# Patient Record
Sex: Male | Born: 1935
Health system: Southern US, Community
[De-identification: ages and names within clinical notes are randomized; demographics above are authoritative.]

## PROBLEM LIST (undated history)

## (undated) DIAGNOSIS — F419 Anxiety disorder, unspecified: Secondary | ICD-10-CM

## (undated) DIAGNOSIS — F0280 Dementia in other diseases classified elsewhere without behavioral disturbance: Secondary | ICD-10-CM

## (undated) DIAGNOSIS — K5900 Constipation, unspecified: Secondary | ICD-10-CM

## (undated) DIAGNOSIS — I10 Essential (primary) hypertension: Secondary | ICD-10-CM

## (undated) DIAGNOSIS — I517 Cardiomegaly: Secondary | ICD-10-CM

## (undated) DIAGNOSIS — F0390 Unspecified dementia without behavioral disturbance: Secondary | ICD-10-CM

## (undated) DIAGNOSIS — F32A Depression, unspecified: Secondary | ICD-10-CM

## (undated) DIAGNOSIS — I2510 Atherosclerotic heart disease of native coronary artery without angina pectoris: Secondary | ICD-10-CM

## (undated) DIAGNOSIS — K219 Gastro-esophageal reflux disease without esophagitis: Secondary | ICD-10-CM

## (undated) DIAGNOSIS — H9190 Unspecified hearing loss, unspecified ear: Secondary | ICD-10-CM

## (undated) DIAGNOSIS — I119 Hypertensive heart disease without heart failure: Secondary | ICD-10-CM

## (undated) DIAGNOSIS — J449 Chronic obstructive pulmonary disease, unspecified: Secondary | ICD-10-CM

## (undated) DIAGNOSIS — K649 Unspecified hemorrhoids: Secondary | ICD-10-CM

## (undated) DIAGNOSIS — F329 Major depressive disorder, single episode, unspecified: Secondary | ICD-10-CM

## (undated) DIAGNOSIS — E785 Hyperlipidemia, unspecified: Secondary | ICD-10-CM

## (undated) DIAGNOSIS — J45909 Unspecified asthma, uncomplicated: Secondary | ICD-10-CM

## (undated) DIAGNOSIS — N400 Enlarged prostate without lower urinary tract symptoms: Secondary | ICD-10-CM

## (undated) HISTORY — DX: Gastro-esophageal reflux disease without esophagitis: K21.9

## (undated) HISTORY — DX: Atherosclerotic heart disease of native coronary artery without angina pectoris: I25.10

## (undated) HISTORY — PX: OTHER SURGICAL HISTORY: SHX169

## (undated) HISTORY — DX: Constipation, unspecified: K59.00

## (undated) HISTORY — DX: Hypertensive heart disease without heart failure: I11.9

## (undated) HISTORY — PX: CHOLECYSTECTOMY: SHX55

## (undated) HISTORY — DX: Anxiety disorder, unspecified: F41.9

## (undated) HISTORY — DX: Chronic obstructive pulmonary disease, unspecified: J44.9

## (undated) HISTORY — DX: Hyperlipidemia, unspecified: E78.5

## (undated) HISTORY — DX: Cardiomegaly: I51.7

## (undated) HISTORY — DX: Dementia in other diseases classified elsewhere, unspecified severity, without behavioral disturbance, psychotic disturbance, mood disturbance, and anxiety: F02.80

## (undated) HISTORY — PX: HEMORRHOID SURGERY: SHX153

## (undated) HISTORY — DX: Unspecified hemorrhoids: K64.9

## (undated) HISTORY — DX: Unspecified hearing loss, unspecified ear: H91.90

## (undated) HISTORY — DX: Benign prostatic hyperplasia without lower urinary tract symptoms: N40.0

## (undated) HISTORY — DX: Depression, unspecified: F32.A

---

## 1998-05-03 ENCOUNTER — Encounter: Payer: Self-pay | Admitting: Family Medicine

## 1998-05-03 ENCOUNTER — Ambulatory Visit (HOSPITAL_COMMUNITY): Admission: RE | Admit: 1998-05-03 | Discharge: 1998-05-03 | Payer: Self-pay | Admitting: Family Medicine

## 1998-05-04 ENCOUNTER — Encounter: Admission: RE | Admit: 1998-05-04 | Discharge: 1998-05-04 | Payer: Self-pay | Admitting: *Deleted

## 1998-05-12 ENCOUNTER — Encounter: Payer: Self-pay | Admitting: Family Medicine

## 1998-05-12 ENCOUNTER — Ambulatory Visit (HOSPITAL_COMMUNITY): Admission: RE | Admit: 1998-05-12 | Discharge: 1998-05-12 | Payer: Self-pay | Admitting: Family Medicine

## 1999-04-23 ENCOUNTER — Encounter: Payer: Self-pay | Admitting: Family Medicine

## 1999-04-23 ENCOUNTER — Ambulatory Visit (HOSPITAL_COMMUNITY): Admission: RE | Admit: 1999-04-23 | Discharge: 1999-04-23 | Payer: Self-pay | Admitting: Family Medicine

## 1999-07-14 ENCOUNTER — Encounter: Payer: Self-pay | Admitting: General Surgery

## 1999-07-18 ENCOUNTER — Ambulatory Visit (HOSPITAL_COMMUNITY): Admission: RE | Admit: 1999-07-18 | Discharge: 1999-07-19 | Payer: Self-pay | Admitting: General Surgery

## 1999-07-18 ENCOUNTER — Encounter (INDEPENDENT_AMBULATORY_CARE_PROVIDER_SITE_OTHER): Payer: Self-pay | Admitting: *Deleted

## 2000-12-03 ENCOUNTER — Ambulatory Visit (HOSPITAL_COMMUNITY): Admission: RE | Admit: 2000-12-03 | Discharge: 2000-12-03 | Payer: Self-pay | Admitting: Gastroenterology

## 2000-12-03 ENCOUNTER — Encounter (INDEPENDENT_AMBULATORY_CARE_PROVIDER_SITE_OTHER): Payer: Self-pay | Admitting: *Deleted

## 2002-12-17 ENCOUNTER — Ambulatory Visit (HOSPITAL_COMMUNITY): Admission: RE | Admit: 2002-12-17 | Discharge: 2002-12-17 | Payer: Self-pay | Admitting: Family Medicine

## 2002-12-17 ENCOUNTER — Encounter: Payer: Self-pay | Admitting: Family Medicine

## 2003-04-06 ENCOUNTER — Emergency Department (HOSPITAL_COMMUNITY): Admission: AD | Admit: 2003-04-06 | Discharge: 2003-04-06 | Payer: Self-pay | Admitting: Family Medicine

## 2003-05-05 ENCOUNTER — Emergency Department (HOSPITAL_COMMUNITY): Admission: EM | Admit: 2003-05-05 | Discharge: 2003-05-06 | Payer: Self-pay | Admitting: Emergency Medicine

## 2003-08-05 ENCOUNTER — Ambulatory Visit (HOSPITAL_COMMUNITY): Admission: RE | Admit: 2003-08-05 | Discharge: 2003-08-05 | Payer: Self-pay | Admitting: Gastroenterology

## 2003-11-20 ENCOUNTER — Observation Stay (HOSPITAL_COMMUNITY): Admission: EM | Admit: 2003-11-20 | Discharge: 2003-11-21 | Payer: Self-pay | Admitting: Emergency Medicine

## 2003-11-22 ENCOUNTER — Emergency Department (HOSPITAL_COMMUNITY): Admission: EM | Admit: 2003-11-22 | Discharge: 2003-11-22 | Payer: Self-pay

## 2005-09-23 ENCOUNTER — Emergency Department (HOSPITAL_COMMUNITY): Admission: EM | Admit: 2005-09-23 | Discharge: 2005-09-23 | Payer: Self-pay | Admitting: Emergency Medicine

## 2006-06-03 ENCOUNTER — Emergency Department (HOSPITAL_COMMUNITY): Admission: EM | Admit: 2006-06-03 | Discharge: 2006-06-04 | Payer: Self-pay | Admitting: Emergency Medicine

## 2008-09-22 LAB — HM COLONOSCOPY

## 2009-02-22 HISTORY — PX: CARDIAC CATHETERIZATION: SHX172

## 2009-03-11 ENCOUNTER — Encounter: Admission: RE | Admit: 2009-03-11 | Discharge: 2009-03-11 | Payer: Self-pay | Admitting: Cardiovascular Disease

## 2009-03-15 ENCOUNTER — Ambulatory Visit (HOSPITAL_COMMUNITY): Admission: RE | Admit: 2009-03-15 | Discharge: 2009-03-15 | Payer: Self-pay | Admitting: Cardiovascular Disease

## 2009-10-11 ENCOUNTER — Ambulatory Visit: Payer: Self-pay | Admitting: Pulmonary Disease

## 2009-10-11 DIAGNOSIS — E785 Hyperlipidemia, unspecified: Secondary | ICD-10-CM | POA: Insufficient documentation

## 2009-10-11 DIAGNOSIS — R05 Cough: Secondary | ICD-10-CM

## 2009-10-11 DIAGNOSIS — I10 Essential (primary) hypertension: Secondary | ICD-10-CM

## 2009-12-20 ENCOUNTER — Telehealth: Payer: Self-pay | Admitting: Pulmonary Disease

## 2009-12-24 ENCOUNTER — Emergency Department (HOSPITAL_COMMUNITY): Admission: EM | Admit: 2009-12-24 | Discharge: 2009-12-24 | Payer: Self-pay | Admitting: Family Medicine

## 2010-01-31 ENCOUNTER — Inpatient Hospital Stay (HOSPITAL_COMMUNITY): Admission: EM | Admit: 2010-01-31 | Discharge: 2010-02-03 | Payer: Self-pay | Admitting: Emergency Medicine

## 2010-03-23 ENCOUNTER — Ambulatory Visit: Payer: Self-pay | Admitting: Pulmonary Disease

## 2010-05-23 ENCOUNTER — Ambulatory Visit
Admission: RE | Admit: 2010-05-23 | Discharge: 2010-05-23 | Payer: Self-pay | Source: Home / Self Care | Attending: Pulmonary Disease | Admitting: Pulmonary Disease

## 2010-05-24 NOTE — Progress Notes (Signed)
Summary: nos appt  Phone Note Call from Patient   Caller: [email or web address]  Call For: Yvonnie Schinke Summary of Call: LMTCB x2 to rsc nos from 8/26. Initial call taken by: Darletta Moll,  December 20, 2009 2:49 PM

## 2010-05-24 NOTE — Assessment & Plan Note (Signed)
Summary: PER WIFE'S CALL/PT DUE/[facility]   Visit Type:  Follow-up Copy to:  self  Primary Provider/Referring Provider:  n/a ([facility])  CC:  Pt here for follow up. C/o productive cough with thick clear mucus, wheezing, SOB with exertion, and PND. Pt states use to sleep on air mattress and wants to discuss if this has anything to do with his problems.  History of Present Illness: 77//M, ex smoker for evaluation of chronic 'congestion' taking mucinex x few years for constant clearing of his throat, worse in pollen season, clear phlegm, breathing OK. CXR 03/11/09 appears nml. c. cath [name]) -11/10 - nml LV fn, nml coronaries Spirometry showed poor effort, unable to interpret. he denies post nasal drip, childhood h/o asthma or obvious heartburn.  March 23, 2010 3:31 PM  Adm to [facility] for partial SBO in oct '11, told that he had asthma, CXR -streaky  atx lt base c/o cough with clear phlegm, occ wheezing c/o heartburn - takes protonix as needed   Preventive Screening-Counseling & Management  Alcohol-Tobacco     Alcohol drinks/day: occ     Smoking Status: quit     Packs/Day: 0.5     Year Started: 1955     Year Quit: 2004  Current Medications (verified): 1)  Simvastatin 40 Mg Tabs (Simvastatin) .... Take 1 Tab By Mouth At Bedtime 2)  Metoprolol Succinate 25 Mg Xr24h-Tab (Metoprolol Succinate) .... Take 1 Tablet By Mouth Once A Day 3)  Mucinex 600 Mg Xr12h-Tab (Guaifenesin) .... Take 1 Tablet By Mouth Once A Day 4)  Omega-3 Fish Oil 1000 Mg Caps (Omega-3 Fatty Acids) .... Take 2 Tablet By Mouth Once A Day 5)  Aspirin 81 Mg  Tabs (Aspirin) .... Take 1 Tablet By Mouth Once A Day 6)  Cozaar 100 Mg Tabs (Losartan Potassium) .... Take 1 Tablet By Mouth Once A Day 7)  Hydrochlorothiazide 12.5 Mg Caps (Hydrochlorothiazide) .... Take 1 Tablet By Mouth Once A Day 8)  Miralax  Powd (Polyethylene Glycol 3350) .... Daily 9)  Pantoprazole Sodium 40 Mg Tbec (Pantoprazole Sodium) .... Take 1 Tablet  By Mouth Once A Day 10)  Zyrtec Hives Relief 10 Mg Tabs (Cetirizine Hcl) .... 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    Social History: Alcohol drinks/day:  occ  Review of Systems       The patient complains of prolonged cough.  The patient denies anorexia, fever, weight loss, weight gain, vision loss, decreased hearing, hoarseness, chest pain, syncope, dyspnea on exertion, peripheral edema, headaches, hemoptysis, abdominal pain, melena, hematochezia, severe indigestion/heartburn, hematuria, muscle weakness, suspicious skin lesions, transient blindness, difficulty walking, depression, unusual weight change, abnormal bleeding, enlarged lymph nodes, and angioedema.    Vital Signs:  Patient profile:   77 year old male Height:      67 inches Weight:      195.4 pounds BMI:     30.71 O2 Sat:      97 % on Room air Temp:     97.0 degrees F oral Pulse rate:   90 / minute BP sitting:   114 / 60  (left arm) Cuff size:   large  Vitals Entered By: Zackery Barefoot CMA (March 23, 2010 3:20 PM)  O2 Flow:  Room air CC: Pt here for follow up. C/o productive cough with thick clear mucus, wheezing, SOB with exertion, PND. Pt states use to sleep on air mattress and wants to discuss if this has anything to do with his problems Comments Medications reviewed with patient Verified contact number and pharmacy with patient Zackery Barefoot CMA  March 23, 2010 3:20 PM    Physical Exam  Additional Exam:  Gen. Pleasant, well-nourished, in no distress, normal affect ENT - no lesions, no post nasal drip Neck: No JVD, no thyromegaly, no carotid bruits Lungs: no use of accessory  muscles, no dullness to percussion, clear without rales or rhonchi  Cardiovascular: Rhythm regular, heart sounds  normal, no murmurs or gallops, no peripheral edema Musculoskeletal: No deformities, no cyanosis or clubbing      Impression & Recommendations:  Problem # 1:  COUGH (ICD-786.2)  will treat as COPD & GERD trial of symbicort for cough & dyspnea  Take protonix daily  Orders: Est. Patient Level III (40981)  Medications Added to Medication List This Visit: 1)  Miralax Powd (Polyethylene glycol 3350) .... Daily 2)  Pantoprazole Sodium 40 Mg Tbec (Pantoprazole sodium) .... Take 1 tablet by mouth once a day 3)  Zyrtec Hives Relief 10 Mg Tabs (Cetirizine hcl) .... Take 1 tablet by mouth once a day as needed  Patient Instructions: 1)  Copy sent to: 2)  Please schedule a follow-up appointment in 2 months. 3)  Take heartburn pill everyday 4)  Trial of symbicort 80/4.5 1 puff two times a day - call me if this works    Immunization History:  Influenza Immunization History:    Influenza:  historical (12/27/2009)

## 2010-05-24 NOTE — Assessment & Plan Note (Signed)
Summary: cough/apc   Visit Type:  Initial Consult Copy to:  self  Primary Provider/Referring Provider:  n/a ([facility])  CC:  Pt here for initial consult. Pt here for SOB with exertion, chest congestion, and productive cough with thick clear mucus.  History of Present Illness: 77/M, ex smoker for evaluation of chronic 'congestion' taking mucinex x few years for constant clearing of his throat, worse in pollen season, clear phlegm, breathing OK. CXR 03/11/09 appears nml. c. cath [name]) -11/10 - nml LV fn, nml coronaries Spirometry showed poor effort, unable to interpret. he denies post nasal drip, childhood h/o asthma or obvious heartburn.  Preventive Screening-Counseling & Management  Alcohol-Tobacco     Smoking Status: quit     Packs/Day: 0.5     Year Started: 1953     Year Quit: 2004  Current Medications (verified): 1)  Stool Softener 100 Mg Caps (Docusate Sodium) .... Take 1 Tablet By Mouth Once A Day 2)  Simvastatin 40 Mg Tabs (Simvastatin) .... Take 1 Tab By Mouth At Bedtime 3)  Metoprolol Succinate 25 Mg Xr24h-Tab (Metoprolol Succinate) .... Take 1 Tablet By Mouth Once A Day 4)  Mucinex 600 Mg Xr12h-Tab (Guaifenesin) .... Take 1 Tablet By Mouth Once A Day 5)  Omega-3 Fish Oil 1000 Mg Caps (Omega-3 Fatty Acids) .... Take 2 Tablet By Mouth Once A Day 6)  Aspirin 81 Mg  Tabs (Aspirin) .... Take 1 Tablet By Mouth Once A Day 7)  Cozaar 100 Mg Tabs (Losartan Potassium) .... Take 1 Tablet By Mouth Once A Day 8)  Hydrochlorothiazide 12.5 Mg Caps (Hydrochlorothiazide) .... Take 1 Tablet By Mouth Once A Day 9)  Stool Softener 100 Mg Caps (Docusate Sodium) .... 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   Smoking Status:  quit Packs/Day:  0.5  Review of Systems       The patient complains of shortness of breath with activity, shortness of breath at rest, productive cough, acid heartburn, and headaches.  The patient denies non-productive cough, coughing up blood, chest pain, irregular heartbeats, indigestion, loss of appetite, weight change, abdominal pain, difficulty swallowing, sore throat, tooth/dental problems, nasal congestion/difficulty breathing through nose, sneezing, itching, ear ache, anxiety, depression, hand/feet swelling, joint stiffness or pain, rash, change in color of mucus, and fever.    Vital Signs:  Patient profile:   77 year old male Height:      67 inches Weight:      193 pounds BMI:     30.34 BSA:     1.99 O2 Sat:      95 % on Room air Temp:     97.5 degrees F oral Pulse rate:   92 / minute BP sitting:   124 / 78  (left arm) Cuff size:   regular  Vitals Entered By: Zackery Barefoot CMA (October 11, 2009 3:20 PM)  O2 Flow:  Room air CC: Pt here for initial consult. Pt here for SOB with exertion, chest congestion, productive cough with thick clear mucus Comments Medications reviewed with patient Verified contact number and pharmacy with patient Zackery Barefoot CMA  October 11, 2009 3:20 PM  Physical Exam  Additional Exam:  Gen. Pleasant, well-nourished, in no distress, normal affect ENT - no lesions, no post nasal drip Neck: No JVD, no thyromegaly, no carotid bruits Lungs: no use of accessory muscles, no dullness to percussion, clear without rales or rhonchi  Cardiovascular: Rhythm regular, heart sounds  normal, no murmurs or gallops, no peripheral edema Abdomen: soft and non-tender, no hepatosplenomegaly, BS  normal. Musculoskeletal: No deformities, no cyanosis or clubbing Neuro:  alert, non focal     Pulmonary Function Test Date: 10/11/2009 4:11 PM Gender: Male  Pre-Spirometry FVC    Value: 0.39 L/min   % Pred: 11.90 % FEV1    Value: 0.38 L     Pred: 2.45 L     % Pred: 15.40 % FEV1/FVC  Value: 95.61 %     % Pred: 126.30 %  Impression & Recommendations:  Problem # 1:  COUGH (ICD-786.2)  Unclear cause, nml CXR in this ex smoker Unable to estimate lung fucntion accurately, appears sedentary. Will treat for upper airway cough with nasal steroid & oral anti-histaminic. If no relief, consider empiric GERD therapy.  Orders: Prescription Created Electronically [phone]) Consultation Level III [phone]) Spirometry w/Graph (94010)  Medications Added to Medication List This Visit: 1)  Stool Softener 100 Mg Caps (Docusate sodium) .... Take 1 tablet by mouth once a day 2)  Simvastatin 40 Mg Tabs (Simvastatin) .... Take 1 tab by mouth at bedtime 3)  Metoprolol Succinate 25 Mg Xr24h-tab (Metoprolol succinate) .... Take 1 tablet by mouth once a day 4)  Mucinex 600 Mg Xr12h-tab (Guaifenesin) .... Take 1 tablet by mouth once a day 5)  Omega-3 Fish Oil 1000 Mg Caps (Omega-3 fatty acids) .... Take 2 tablet by mouth once a day 6)  Aspirin 81 Mg Tabs (Aspirin) .... Take 1 tablet by mouth once a day 7)  Cozaar 100 Mg Tabs (Losartan potassium) .... Take 1 tablet by mouth once a day 8)  Hydrochlorothiazide 12.5 Mg Caps (Hydrochlorothiazide) .... Take 1 tablet by mouth once a day 9)  Stool Softener 100 Mg Caps (Docusate sodium) .... Take 2 tablet by mouth once a day 10)  Zyrtec Hives Relief 10 Mg Tabs (Cetirizine hcl) .... Once daily  Other Orders: Internal Medicine Referral (Internal)  Patient Instructions: 1)  trial of nasal inhaler (sample) at bedtime  - Omnaris  2)  ZYRTEC allergy pills once daily x 1 month 3)  Call if cough is still present in 1 month Prescriptions: ZYRTEC HIVES RELIEF 10 MG TABS  (CETIRIZINE HCL) once daily  #30 x 1   Entered and Authorized by:   Comer Locket Vassie Loll MD   Signed by:   Comer Locket Vassie Loll MD on 10/12/2009   Method used:   Electronically to        [facility]  [address] [id number] (retail)       [address]       [place], [place]  [address]       Ph: [phone] or [phone]       Fax: [phone]   RxID:   [id number]    CardioPerfect Spirometry  ID: [id number] Patient: Alexander [name], Alexander [name] DOB: [birth year] Age: 77 Years Old Sex: Male Race: Black Physician: Vassie Loll Height: 67 Weight: 193 PPD: 0.5 Status: Confirmed Recorded: 10/11/2009 4:11 PM  Parameter  Measured Predicted %Predicted FVC     0.39        3.31        11.90 FEV1  0.38        2.45        15.40 FEV1%   95.61        75.71        126.30 PEF    0.80        7.10        11.30   Interpretation: Poor effort Very severe restriction

## 2010-06-01 NOTE — Assessment & Plan Note (Signed)
Summary: rov 2 months w/ tp per ra ///kp   Visit Type:  NP follow up Copy to:  self  Primary Provider/Referring Provider:  n/a ([facility])  CC:  Pt c/o productive cough with clear to green mucus. Pt states Symbicort made breathing worse and has d/c same. Pt c/o wheezing and and breathing the same.  History of Present Illness: 77//M, ex smoker for evaluation of chronic 'congestion' taking mucinex x few years for constant clearing of his throat, worse in pollen season, clear phlegm, breathing OK. CXR 03/11/09 appears nml. c. cath [name]) -11/10 - nml LV fn, nml coronaries Spirometry showed poor effort, unable to interpret. he denies post nasal drip, childhood h/o asthma or obvious heartburn.  March 23, 2010 3:31 PM  Adm to [facility] for partial SBO in oct '11, told that he had asthma, CXR -streaky  atx lt base c/o cough with clear phlegm, occ wheezing c/o heartburn - takes protonix as needed   May 23, 2010 --Presents for a follow up. PT says his breathing and cough are no better. Complains of nasal drainage, cough -w/ clear mucus.Has some yellow /green in am but clears as day goes on.  Was given SYmbicort last ov w/ no benefit, felt worse. Previous spirometry unable to read.  Says he feels tired alot , wears out easly, has cough and tickle in throat alot. . Denies chest pain,  orthopnea, hemoptysis, fever, n/v/d, edema, headache,.  Preventive Screening-Counseling & Management  Alcohol-Tobacco     Alcohol drinks/day: occ     Smoking Status: quit     Packs/Day: 0.5     Year Started: 1953     Year Quit: 2004  Medications Prior to Update: 1)  Pantoprazole Sodium 40 Mg Tbec (Pantoprazole Sodium) .... Take 1 Tablet By Mouth Once A Day 2)  Simvastatin 40 Mg Tabs (Simvastatin) .... Take 1 Tab By Mouth At Bedtime 3)  Mucinex 600 Mg Xr12h-Tab (Guaifenesin) .... Take 1 Tablet By Mouth Once A Day 4)  Metoprolol Succinate 25 Mg Xr24h-Tab (Metoprolol Succinate) .... Take 1 Tablet By Mouth  Once A Day 5)  Omega-3 Fish Oil 1000 Mg Caps (Omega-3 Fatty Acids) .... Take 2 Tablet By Mouth Once A Day 6)  Aspirin 81 Mg  Tabs (Aspirin) .... Take 1 Tablet By Mouth Once A Day 7)  Cozaar 100 Mg Tabs (Losartan Potassium) .... Take 1 Tablet By Mouth Once A Day 8)  Hydrochlorothiazide 12.5 Mg Caps (Hydrochlorothiazide) .... Take 1 Tablet By Mouth Once A Day 9)  Miralax  Powd (Polyethylene Glycol 3350) .... Daily 10)  Zyrtec Hives Relief 10 Mg Tabs (Cetirizine Hcl) .... Take 1 Tablet By Mouth Once A Day As Needed  Current Medications (verified): 1)  Simvastatin 40 Mg Tabs (Simvastatin) .... Take 1 Tab By Mouth At Bedtime 2)  Metoprolol Succinate 25 Mg Xr24h-Tab (Metoprolol Succinate) .... Take 1 Tablet By Mouth Once A Day 3)  Mucinex 600 Mg Xr12h-Tab (Guaifenesin) .... Take 1 Tablet By Mouth Once A Day As Needed 4)  Omega-3 Fish Oil 1000 Mg Caps (Omega-3 Fatty Acids) .... Take 2 Tablet By Mouth Once A Day 5)  Aspirin 81 Mg  Tabs (Aspirin) .... Take 1 Tablet By Mouth Once A Day 6)  Cozaar 100 Mg Tabs (Losartan Potassium) .... Take 1 Tablet By Mouth Once A Day 7)  Hydrochlorothiazide 12.5 Mg Caps (Hydrochlorothiazide) .... Take One Tablet By Mouth Every Other Day 8)  Miralax  Powd (Polyethylene Glycol 3350) .... Daily 9)  Pantoprazole Sodium 40 Mg Tbec (Pantoprazole Sodium) .... Take 1 Tablet By Mouth Once A Day 10)  Zyrtec Hives Relief 10 Mg Tabs (Cetirizine Hcl) .... 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    Risk Factors: Smoking Status: quit (05/23/2010) Packs/Day: 0.5 (05/23/2010)  Review of Systems      See HPI  Vital Signs:  Patient profile:   77 year old male Height:      67 inches Weight:      198 pounds BMI:     31.12 O2 Sat:      91 % on Room air Temp:     97.5 degrees F oral Pulse rate:   64 / minute BP sitting:   104 / 64  (left arm) Cuff size:   large  Vitals Entered By: Zackery Barefoot CMA (May 23, 2010 2:22 PM)  O2 Flow:  Room air CC: Pt c/o productive cough with clear to green mucus. Pt states Symbicort made breathing worse and has d/c same. Pt c/o wheezing, and breathing the same Is Patient Diabetic? No Comments Medications reviewed with patient Verified contact number and pharmacy with patient Zackery Barefoot CMA  May 23, 2010 2:22 PM    Physical Exam  Additional Exam:  Gen. Pleasant, well-nourished, in no distress, normal affect ENT - no lesions,  post pharynx w/  clear drainge  Neck: No JVD, no thyromegaly, no carotid bruits Lungs: no use of accessory muscles, no dullness to percussion, clear without rales or rhonchi  Cardiovascular: Rhythm regular, heart sounds  normal, no murmurs or gallops, no peripheral edema Musculoskeletal: No deformities, no cyanosis or clubbing      Impression & Recommendations:  Problem # 1:  COUGH (ICD-786.2) XR today w/ no acute changes.  suspect this is multifactoral w/ rhinitis , gerd and ? degree of COPD  Plan : Take Zyrtec 10mg  at bedtime  Saline nasal rinses  two times a day  Patanase 2 puffs at bedtime  Begin Spiriva once daily .  Delsym 2 tsp two times a day as needed cough  follow up Dr. Vassie Loll in 4 weeks  Please contact office for sooner follow up if symptoms do not improve or worsen   Medications Added to Medication List This Visit: 1)  Hydrochlorothiazide 12.5 Mg Caps  (Hydrochlorothiazide) .... Take one tablet by mouth every other day 2)  Mucinex 600 Mg Xr12h-tab (Guaifenesin) .... Take 1 tablet by mouth once a day as needed  Other Orders: T-2 View CXR (71020TC) Est. Patient Level IV (81191)  Patient Instructions: 1)  Take Zyrtec 10mg  at bedtime  2)  Saline nasal rinses  two times a day  3)  Patanase 2 puffs at bedtime  4)  Begin Spiriva once daily .  5)  Delsym 2 tsp two times a day as needed cough  6)  follow up Dr. Vassie Loll in 4 weeks  7)  Please contact office for sooner follow up if symptoms do not improve or worsen

## 2010-06-22 ENCOUNTER — Encounter: Payer: Self-pay | Admitting: Pulmonary Disease

## 2010-06-22 ENCOUNTER — Ambulatory Visit (INDEPENDENT_AMBULATORY_CARE_PROVIDER_SITE_OTHER): Payer: Medicare Other | Admitting: Pulmonary Disease

## 2010-06-22 DIAGNOSIS — R110 Nausea: Secondary | ICD-10-CM | POA: Insufficient documentation

## 2010-06-22 DIAGNOSIS — R05 Cough: Secondary | ICD-10-CM

## 2010-06-30 NOTE — Assessment & Plan Note (Signed)
Summary: 4 week return/mhh   Visit Type:  Follow-up Copy to:  self  Primary Provider/Referring Provider:  n/a ([facility])  CC:  Pt here for follow up. Pt states breathing is the same. Pt states cough and wheezing less since using Advair 250 once daily. Tried Spiriva and did not get any relief.  History of Present Illness: 77//M, ex smoker for evaluation of chronic 'congestion'/ cough taking mucinex x few years for constant clearing of his throat, worse in pollen season, clear phlegm, breathing OK. CXR 03/11/09 appears nml. c. cath [name]) -11/10 - nml LV fn, nml coronaries Spirometry showed poor effort, unable to interpret.  c/o heartburn - takes protonix as needed  Did not tolerate Symbicort & spiriva  June 22, 2010 2:31 PM  Took wife's advair & felt much better, C/o nausea in am  Denies chest pain,  orthopnea, hemoptysis, fever, n/v/d, edema, headache,.  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    Review of Systems       The patient complains of prolonged cough.  The patient denies anorexia, fever, weight loss, weight gain, vision loss, decreased hearing, hoarseness, chest pain, syncope, dyspnea on exertion, peripheral edema, headaches, hemoptysis, abdominal pain, melena, hematochezia, severe indigestion/heartburn, hematuria, incontinence, suspicious skin lesions, transient blindness, difficulty walking, depression, unusual weight change, abnormal bleeding, enlarged lymph nodes, and angioedema.    Vital Signs:  Patient  profile:   77 year old male Height:      67 inches Weight:      196.4 pounds BMI:     30.87 O2 Sat:      97 % on Room air Temp:     98.2 degrees F oral Pulse rate:   59 / minute BP sitting:   100 / 70  (left arm) Cuff size:   regular  Vitals Entered By: Zackery Barefoot CMA (June 22, 2010 2:25 PM)  O2 Flow:  Room air CC: Pt here for follow up. Pt states breathing is the same. Pt states cough and wheezing less since using Advair 250 once daily. Tried Spiriva and did not get any relief Comments Medications reviewed with patient Verified contact number and pharmacy with patient Zackery Barefoot CMA  June 22, 2010 2:26 PM    Physical Exam  Additional Exam:  Gen. Pleasant, well-nourished, in no distress, normal affect ENT - no lesions,  post pharynx w/  clear drainge  Neck: No JVD, no thyromegaly, no carotid bruits Lungs: no use of accessory muscles, no dullness to percussion, clear without rales or rhonchi  Cardiovascular: Rhythm regular, heart sounds  normal, no murmurs or gallops, no peripheral edema Musculoskeletal: No deformities, no cyanosis or clubbing      Impression & Recommendations:  Problem # 1:  COUGH (ICD-786.2)  will treat as underlying copd, good response to advair - will continue  Orders: Est. Patient Level III (16109) Prescription Created Electronically [phone])  Problem # 2:  NAUSEA (ICD-787.02)  recent SBO trial of reglan ct protonix  Orders: Est. Patient Level III (09811) Prescription Created Electronically [phone])  Medications Added  to Medication List This Visit: 1)  Metoclopramide Hcl 10 Mg Tabs (Metoclopramide hcl) .... Once daily as needed for nausea 2)  Advair Diskus 250-50 Mcg/dose Aepb (Fluticasone-salmeterol) .... Once daily  Patient Instructions: 1)  Copy sent to: 2)  Please schedule a follow-up appointment in 4 months. 3)  Advair sample & Rx sent 250/50  4)  Reglan pill sfor nausea as needed  Prescriptions: ADVAIR DISKUS  250-50 MCG/DOSE AEPB (FLUTICASONE-SALMETEROL) once daily  #1 x 2   Entered and Authorized by:   Comer Locket Vassie Loll MD   Signed by:   Comer Locket Vassie Loll MD on 06/22/2010   Method used:   Print then Give to Patient   RxID:   [id number] METOCLOPRAMIDE HCL 10 MG TABS (METOCLOPRAMIDE HCL) once daily as needed for nausea  #30 x 0   Entered and Authorized by:   Comer Locket Vassie Loll MD   Signed by:   Comer Locket Vassie Loll MD on 06/22/2010   Method used:   Print then Give to Patient   RxID:   [id number]

## 2010-07-07 LAB — URINALYSIS, ROUTINE W REFLEX MICROSCOPIC
Bilirubin Urine: NEGATIVE
Hgb urine dipstick: NEGATIVE
Ketones, ur: NEGATIVE mg/dL
Nitrite: NEGATIVE
Protein, ur: NEGATIVE mg/dL
Urobilinogen, UA: 0.2 mg/dL (ref 0.0–1.0)

## 2010-07-07 LAB — CBC
HCT: 35.9 % — ABNORMAL LOW (ref 39.0–52.0)
HCT: 38.7 % — ABNORMAL LOW (ref 39.0–52.0)
Hemoglobin: 12.9 g/dL — ABNORMAL LOW (ref 13.0–17.0)
MCH: 30.3 pg (ref 26.0–34.0)
MCHC: 31.9 g/dL (ref 30.0–36.0)
MCHC: 32 g/dL (ref 30.0–36.0)
MCHC: 32.5 g/dL (ref 30.0–36.0)
MCHC: 33.3 g/dL (ref 30.0–36.0)
MCV: 93.2 fL (ref 78.0–100.0)
Platelets: 244 10*3/uL (ref 150–400)
Platelets: 256 10*3/uL (ref 150–400)
RBC: 3.8 MIL/uL — ABNORMAL LOW (ref 4.22–5.81)
RBC: 4.15 MIL/uL — ABNORMAL LOW (ref 4.22–5.81)
RDW: 12.8 % (ref 11.5–15.5)
RDW: 12.9 % (ref 11.5–15.5)
WBC: 5.1 10*3/uL (ref 4.0–10.5)
WBC: 7.7 10*3/uL (ref 4.0–10.5)

## 2010-07-07 LAB — DIFFERENTIAL
Basophils Absolute: 0 10*3/uL (ref 0.0–0.1)
Basophils Relative: 0 % (ref 0–1)
Eosinophils Absolute: 0.2 10*3/uL (ref 0.0–0.7)
Eosinophils Relative: 2 % (ref 0–5)
Lymphs Abs: 1.7 10*3/uL (ref 0.7–4.0)
Neutrophils Relative %: 74 % (ref 43–77)

## 2010-07-07 LAB — LIPASE, BLOOD: Lipase: 30 U/L (ref 11–59)

## 2010-07-07 LAB — COMPREHENSIVE METABOLIC PANEL
ALT: 17 U/L (ref 0–53)
AST: 20 U/L (ref 0–37)
Alkaline Phosphatase: 62 U/L (ref 39–117)
CO2: 30 mEq/L (ref 19–32)
Calcium: 9.7 mg/dL (ref 8.4–10.5)
Chloride: 107 mEq/L (ref 96–112)
GFR calc non Af Amer: >60 mL/min (ref >60)
Glucose, Bld: 112 mg/dL — ABNORMAL HIGH (ref 70–99)
Sodium: 143 mEq/L (ref 135–145)
Total Bilirubin: 0.6 mg/dL (ref 0.3–1.2)

## 2010-07-07 LAB — BASIC METABOLIC PANEL
BUN: 11 mg/dL (ref 6–23)
Calcium: 8.5 mg/dL (ref 8.4–10.5)
GFR calc non Af Amer: >60 mL/min (ref >60)
Glucose, Bld: 77 mg/dL (ref 70–99)
Potassium: 4 mEq/L (ref 3.5–5.1)
Sodium: 141 mEq/L (ref 135–145)

## 2010-07-07 LAB — HEMOCCULT GUIAC POC 1CARD (OFFICE): Fecal Occult Bld: POSITIVE
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
## 2010-09-09 NOTE — Procedures (Signed)
[place]  Patient:    Alexander [name], Alexander [name]                    MRN: [id number] Proc. Date: 12/03/00 Adm. Date:  [id number] Attending:  Rich Brave CC:         Elana Alm. Eliezer Lofts., M.D.   Procedure Report  PROCEDURE:  Colonoscopy with polypectomy.  INDICATIONS:  A 77 year old gentleman for colon cancer screening.  FINDINGS:  Two small polyps in the proximal ascending colon (one not removed), sigmoid diverticulosis.  DESCRIPTION OF PROCEDURE:  The nature, purpose, and risks of the procedure had been reviewed with the patient, who provided written consent.  Sedation totalled fentanyl 90 mcg and Versed 8 mg IV without arrhythmias or desaturation.  Digital exam of the prostate was unremarkable.  The Olympus adult video colonoscope was advanced to the cecum, and pullback was then performed.  A short distance above the cecum was a semipedunculated, fleshy 5 mm polyp, which I initially cold-biopsied and then snared off with complete hemostasis and no evidence of excessive cautery.  The polyp was suctioned through the scope, and fragments were retrieved for histologic analysis.  Near this area, about three or four folds above the ileocecal valve, there was a sessile polyp on a fold probably measuring about 5 mm or so, which I attempted to snare off, but because of the angulation of the colon at this area and the orientation of the polyp within the colonic lumen, repeated efforts to engage the polyp were unsuccessful.  In one or two instances, we actually had the polypectomy snare around the lesion or over it but in attempting to close the snare, would slide off.  At other times, we had trouble even maintaining position to maintain visualization of the lesion.  We changed the patient into the supine position with the hope that we would be able to engage the polyp, but we were never successful in visualizing the polyp at that time.  In view  of the relatively small size of the polyp and its benign appearance, ultimately further efforts were abandoned and pullback was then performed.  The quality of prep was very good, and it is felt that all areas were well-seen.  In addition to the two polyps mentioned above, there was substantial sigmoid diverticular change and associated muscular thickening.  Retroflexion in the rectum was unremarkable.  The quality of the prep was very good, and it is felt that all areas were well-seen.  IMPRESSION: 1. Small to medium-sized polyp removed from the area a short distance above    the cecum. 2. Small to medium-sized polyp not able to be engaged or removed during this    exam, but not worrisome in its appearance. 3. Sigmoid diverticulosis and muscular thickening. 4. Moderate internal hemorrhoids observed during pull-out through the anal    canal.  PLAN:  Await pathology on polyp.  Anticipate colonoscopic follow-up in three years regardless of polyp histology because of the small polyp left behind on todays exam. DD:  12/03/00 TD:  12/03/00 Job: [id number]

## 2010-09-09 NOTE — Discharge Summary (Signed)
NAME:  Alexander [name], Alexander [name]                       ACCOUNT NO.:  [id number]   MEDICAL RECORD NO.:  [id number]                   PATIENT TYPE:  INP   LOCATION:  3711                                 FACILITY:  [facility]   PHYSICIAN:  Vida Roller, M.D.                DATE OF BIRTH:  January 08, 1933   DATE OF ADMISSION:  11/20/2003  DATE OF DISCHARGE:  11/21/2003                                 DISCHARGE SUMMARY   ADMISSION DIAGNOSES:  1. Atypical chest pain, rule out myocardial infarction.  2. Hypertension.  3. Hyperlipidemia.  4. Remote tobacco.   DISCHARGE DIAGNOSES:  1. Chest pain resolved, myocardial infarction ruled out with negative     enzymes. Scheduled for outpatient Cardiolite.  2. Hypertension.  3. Hyperlipidemia.  4. Remote tobacco.   HISTORY OF PRESENT ILLNESS:  Alexander [name] is a 77 year old gentleman with  history of hypertension and hyperlipidemia who presents to the emergency  department with recurrent chest pain. He states approximately one month ago  he presented to the ER complaining of a catch under his left nipple that  was nonexertional. He was told it was costochondritis syndrome. He did well  until approximately two days ago when he began to feel the same sensation.  This occurred shortly after falling off a ladder while he had been doing  some work. He denies any pleuritic component and no exertional component  either. There is no associated diaphoresis, palpitations, radiation, nausea,  vomiting, or shortness of breath. He has been pain free for the last 24  hours.   The patient had an EKG in the ER which shows inverted T waves in V2 through  V6 which appear to be old. Labs were pending at the time of the admission  H&P. He was admitted for 24-hour observation. His cardiac enzymes were  negative. Recommend outpatient Cardiolite. If positive, will have a heart  catheter or recurrent pain.   PROCEDURE:  None.   COMPLICATIONS:  None.   CONSULTATIONS:   None.   COURSE IN THE HOSPITAL:  Mr. Blizard was admitted to [facility]  on November 20, 2003 for atypical chest pain and risk factors including age,  gender, hypertension, and hyperlipidemia.   Her cardiac enzymes were negative x3. The patient remained pain free with no  further episodes of discomfort.   On the morning of November 21, 2003, it was felt the patient was stable for  discharge to home. He will need to have a stress test scheduled as an  outpatient. This was tentatively scheduled for Wednesday, November 30, 2003 at  1:30 p.m. at the office; however, the patient does not think this is going  to work into his schedule. He is instructed to call Monday to reschedule.   DISCHARGE MEDICATIONS:  1. Aspirin 81 mg a day.  2. Amlodipine 5 mg a day.  3. Lipitor 20 mg a day.   DIET:  Low salt, low  cholesterol.   ACTIVITY:  As tolerated.      Georgiann Cocker Jernejcic, P.A.                   Vida Roller, M.D.    TCJ/MEDQ  D:  11/21/2003  T:  11/21/2003  Job:  [id number]

## 2010-09-09 NOTE — Op Note (Signed)
NAME:  Alexander [name], Alexander [name]                       ACCOUNT NO.:  [id number]   MEDICAL RECORD NO.:  [id number]                   PATIENT TYPE:  AMB   LOCATION:  ENDO                                 FACILITY:  [facility]   PHYSICIAN:  Anselmo Rod, M.D.               DATE OF BIRTH:  Aug 19, 1932   DATE OF PROCEDURE:  08/05/2003  DATE OF DISCHARGE:                                 OPERATIVE REPORT   PROCEDURE:  Screening colonoscopy.   ENDOSCOPIST:  Anselmo Rod, M.D.   INSTRUMENT USED:  Olympus video colonoscope.   INDICATIONS FOR PROCEDURE:  A 77 year old African-American male with a  history of change in bowel habits and blood in stool, undergoing a screening  colonoscopy to rule out colonic polyps, masses, etc.   PREPROCEDURE PREPARATION:  Informed consent was procured from the patient.  The patient was fasted for eight hours prior to the procedure and prepped  with a bottle magnesium citrate and a gallon of GoLYTELY the night prior to  the procedure.   PREPROCEDURE PHYSICAL EXAMINATION:  VITAL SIGNS:  Stable.  NECK:  Supple.  CHEST:  Clear to auscultation.  S1 and S2 regular.  ABDOMEN:  Soft with normal bowel sounds.   DESCRIPTION OF PROCEDURE:  The patient was placed in the left lateral  decubitus position and sedated with 60 mg of Demerol and 6 mg of Versed in  slow incremental doses. Once the patient was adequately sedated and  maintained on low flow oxygen and continuous cardiac monitoring, the Olympus  video colonoscope was advanced through the rectum into the cecum.  The  appendiceal orifice and ileocecal valve were clearly visualized and  photographed.  Prominent internal hemorrhoids were seen on retroflexion in  the rectum.  There was evidence of pan diverticulosis with most prominent  changes in the sigmoid colon.  No masses or polyps were identified.  The  patient tolerated the procedure well without immediate complications.   IMPRESSION:  1. Pan diverticulosis with  most prominent changes in the sigmoid colon.  2. Internal hemorrhoids.   RECOMMENDATIONS:  High fiber diet with liberal fluid intake.  This was  discussed with the patient in great detail and brochures on diverticulosis  have been given to the patient for his education.  Outpatient follow-up in  the next two weeks for further recommendations.                                               Anselmo Rod, M.D.    JNM/MEDQ  D:  08/05/2003  T:  08/06/2003  Job:  [id number]   cc:   Gabriel Earing, M.D.  [address]  [place]  [place] [address]  Fax: [phone]

## 2010-09-09 NOTE — Op Note (Signed)
[place]  Patient:    Alexander [name], Alexander [name]                    MRN: [id number] Proc. Date: 07/18/99 Adm. Date:  [id number] Disc. Date: [id number] Attending:  Tempie Donning CC:         Dr. Robert Bellow, [facility]                           Operative Report  PREOPERATIVE DIAGNOSIS:  Cholelithiasis, cholecystitis.  POSTOPERATIVE DIAGNOSIS:  Cholelithiasis, cholecystitis.  OPERATION PERFORMED:  Laparoscopic cholecystectomy.  SURGEON:  Gita Kudo, M.D.  ASSISTANT:  Anselm Pancoast. Zachery Dakins, M.D.  ANESTHESIA:  General endotracheal.  INDICATIONS FOR PROCEDURE:  The patient is a 77 year old man with bouts of abdominal pain and gallbladder ultrasound showing stones.  Liver function studies are normal.  OPERATIVE FINDINGS:  The patient had a thin gallbladder with a few filmy adhesions around it.  It was not acutely inflamed.  The cystic duct and artery were normal in size and anatomy.  He had one small inflammatory adhesion to the anterior abdominal wall and the right lower quadrant that was removed by electroscissors.  DESCRIPTION OF PROCEDURE:   Under satisfactory general endotracheal anesthesia,  having received 1.0 gm Ancef preoperatively, the patients abdomen was prepped and draped in a standard fashion.  A transverse incision was made above the umbilicus and the midline opened into the peritoneum.  Controlled with a figure-of-eight  Vicryl suture.  An operating Hasson port inserted, secured and used to get good CO2 pneumoperitoneum.  The camera was then placed and under direct vision, two #5 ports were placed laterally and a second #10 port medially.  Graspers placed through he lateral ports gave excellent exposure and operating through the medial port, I carefully dissected the cystic duct gallbladder junction.  When we were certain of the anatomy, the cystic duct was controlled with multiple metal clips  and divided between the distal two.  Likewise, the cystic artery was identified, clipped and divided.  Then the gallbladder was removed from below upward using the coagulating spatula for hemostasis and dissection.  After severing the gallbladder, the liver bed was lavaged with saline, made dry by cautery.  Camera moved to the upper port and through the lower port, the gallbladder removed intact and without difficulty.  We identified the adhesion and removed it.  The  camera was left in the upper port and the electroscissors placed through the umbilical port and used to coagulate and incise, thereby releasing the adhesion. There was no bleeding.  Following this, the CO2 and all ports were released. The individual sites were infiltrated with Marcaine for postoperative analgesia. The midline closed with the previous suture and a second interrupted 0 Vicryl suture. Subcutaneous approximated with 4-0 Vicryl and then Steri-Strips applied to the skin.  There were no complications and the sponge and needle counts were correct. DD:  07/18/99 TD:  07/19/99 Job: [id number]

## 2011-02-13 ENCOUNTER — Other Ambulatory Visit: Payer: Self-pay | Admitting: Family Medicine

## 2011-02-13 ENCOUNTER — Ambulatory Visit
Admission: RE | Admit: 2011-02-13 | Discharge: 2011-02-13 | Disposition: A | Discharge status: Home / Self Care | Payer: Medicare Other | Source: Ambulatory Visit | Attending: Family Medicine | Admitting: Family Medicine

## 2011-02-13 DIAGNOSIS — R0602 Shortness of breath: Secondary | ICD-10-CM

## 2011-02-13 DIAGNOSIS — J209 Acute bronchitis, unspecified: Secondary | ICD-10-CM

## 2011-02-13 DIAGNOSIS — R05 Cough: Secondary | ICD-10-CM

## 2012-01-30 ENCOUNTER — Other Ambulatory Visit: Payer: Self-pay | Admitting: Physical Medicine and Rehabilitation

## 2012-01-30 DIAGNOSIS — M549 Dorsalgia, unspecified: Secondary | ICD-10-CM

## 2012-01-31 ENCOUNTER — Ambulatory Visit
Admission: RE | Admit: 2012-01-31 | Discharge: 2012-01-31 | Disposition: A | Discharge status: Home / Self Care | Payer: Medicare Other | Source: Ambulatory Visit | Attending: Physical Medicine and Rehabilitation | Admitting: Physical Medicine and Rehabilitation

## 2012-01-31 DIAGNOSIS — M549 Dorsalgia, unspecified: Secondary | ICD-10-CM

## 2012-06-29 ENCOUNTER — Encounter (HOSPITAL_COMMUNITY): Payer: Self-pay | Admitting: Emergency Medicine

## 2012-06-29 ENCOUNTER — Emergency Department (HOSPITAL_COMMUNITY)
Admission: EM | Admit: 2012-06-29 | Discharge: 2012-06-29 | Disposition: A | Discharge status: Home / Self Care | Payer: Medicare Other | Attending: Emergency Medicine | Admitting: Emergency Medicine

## 2012-06-29 DIAGNOSIS — H9209 Otalgia, unspecified ear: Secondary | ICD-10-CM | POA: Insufficient documentation

## 2012-06-29 DIAGNOSIS — H9202 Otalgia, left ear: Secondary | ICD-10-CM

## 2012-06-29 DIAGNOSIS — K120 Recurrent oral aphthae: Secondary | ICD-10-CM | POA: Insufficient documentation

## 2012-06-29 DIAGNOSIS — J45909 Unspecified asthma, uncomplicated: Secondary | ICD-10-CM | POA: Insufficient documentation

## 2012-06-29 HISTORY — DX: Unspecified asthma, uncomplicated: J45.909

## 2012-06-29 HISTORY — DX: Essential (primary) hypertension: I10

## 2012-06-29 MED ORDER — AMOXICILLIN-POT CLAVULANATE 875-125 MG PO TABS: 1.0000 | ORAL_TABLET | Freq: Two times a day (BID) | ORAL | Status: DC

## 2012-06-29 NOTE — ED Provider Notes (Signed)
Medical screening examination/treatment/procedure(s) were performed by non-physician practitioner and as supervising physician I was immediately available for consultation/collaboration.   Gwyneth Sprout, MD 06/29/12 [phone]

## 2012-06-29 NOTE — ED Notes (Signed)
Patient c/o otalgia in left ear and left dental pain x 2 months.  Patient denies fevers.

## 2012-06-29 NOTE — ED Provider Notes (Signed)
History    This chart was scribed for non-physician practitioner working with Gwyneth Sprout, MD by Frederik Pear, ED Scribe. This patient was seen in room WTR7/WTR7 and the patient's care was started at 1517.   CSN: [id number]  Arrival date & time 06/29/12  1508   First MD Initiated Contact with Patient 06/29/12 1517      No chief complaint on file.   (Consider location/radiation/quality/duration/timing/severity/associated sxs/prior treatment) The history is provided by the patient. No language interpreter was used.   Alexander [name] is a 79 y.o. male who presents to the Emergency Department with a chief complaint of gradual onset, constant left ear otalgia that initially began in Jan and has gradually worsened and now radiates to his left jaw. He reports that he saw his PCP, Dr. Elias Else, and was given a prescription of amoxicillin, which has provided no relief. He also complains of gradually worsening, constant sore inside of his mouth. He denies any fevers or dental pain since all of his teeth have been pulled and he currently wears dentures. He denies having a current dentist. His wife reports that treated the sore by gargling peroxide and Orjael, which has provided no relief. He denies any allergies to medication.  No past medical history on file.  No past surgical history on file.  No family history on file.  History  Substance Use Topics  . Smoking status: Not on file  . Smokeless tobacco: Not on file  . Alcohol Use: Not on file      Review of Systems A complete 10 system review of systems was obtained and all systems are negative except as noted in the HPI and PMH.  Allergies  Review of patient's allergies indicates not on file.  Home Medications  No current outpatient prescriptions on file.  There were no vitals taken for this visit.  Physical Exam  Nursing note and vitals reviewed. Constitutional: He is oriented to person, place, and time. He appears  well-developed and well-nourished. No distress.  HENT:  Head: Normocephalic and atraumatic.  Right Ear: Tympanic membrane and external ear normal. No drainage. Tympanic membrane is not injected, not erythematous and not bulging.  Left Ear: Tympanic membrane and external ear normal. No drainage. Tympanic membrane is not injected, not erythematous and not bulging.  There is a 0.5 cm canker sore on the lower left oral mucosa. No signs of infection. No signs of Ludwig's angina. No pain over the mastoids or signs of mastoiditis.  Eyes: EOM are normal. Pupils are equal, round, and reactive to light.  Neck: Normal range of motion. Neck supple. No tracheal deviation present.  Cardiovascular: Normal rate.   Pulmonary/Chest: Effort normal. No respiratory distress.  Abdominal: Soft. He exhibits no distension.  Musculoskeletal: Normal range of motion. He exhibits no edema.  Lymphadenopathy:    He has no cervical adenopathy.  Neurological: He is alert and oriented to person, place, and time.  Skin: Skin is warm and dry.  Psychiatric: He has a normal mood and affect. His behavior is normal.    ED Course  Procedures (including critical care time)  DIAGNOSTIC STUDIES: Oxygen Saturation is 97% on room air, adequate by my interpretation.    COORDINATION OF CARE:  15:47- Discussed planned course of treatment with the patient, including Augmentin and a follow up with ENT if no improvement, who is agreeable at this time.   Labs Reviewed - No data to display No results found.   1. Ear ache, left  2. Canker sore       MDM  79 year old with ear pain.  He states that he has had symptoms for several months.  He was treated with amoxicillin which helped, but did not resolve his symptoms.  I will treat with Augmentin, and refer to ENT if no improvement.  I personally performed the services described in this documentation, which was scribed in my presence. The recorded information has been reviewed  and is accurate.        Roxy Horseman, PA-C 06/29/12 1600

## 2012-09-10 ENCOUNTER — Other Ambulatory Visit: Payer: Self-pay | Admitting: Cardiovascular Disease

## 2012-09-10 NOTE — Telephone Encounter (Signed)
Rx was sent to pharmacy electronically. 

## 2012-09-12 ENCOUNTER — Encounter: Payer: Self-pay | Admitting: *Deleted

## 2012-09-13 ENCOUNTER — Ambulatory Visit (INDEPENDENT_AMBULATORY_CARE_PROVIDER_SITE_OTHER): Payer: 59 | Admitting: Cardiovascular Disease

## 2012-09-13 ENCOUNTER — Encounter: Payer: Self-pay | Admitting: Cardiovascular Disease

## 2012-09-13 VITALS — BP 114/70 | HR 62 | Ht 67.0 in | Wt 180.6 lb

## 2012-09-13 DIAGNOSIS — I10 Essential (primary) hypertension: Secondary | ICD-10-CM

## 2012-09-13 DIAGNOSIS — E785 Hyperlipidemia, unspecified: Secondary | ICD-10-CM

## 2012-09-13 DIAGNOSIS — I119 Hypertensive heart disease without heart failure: Secondary | ICD-10-CM

## 2012-09-13 DIAGNOSIS — R0602 Shortness of breath: Secondary | ICD-10-CM

## 2012-09-13 LAB — COMPREHENSIVE METABOLIC PANEL
AST: 20 U/L (ref 0–37)
Albumin: 4.2 g/dL (ref 3.5–5.2)
BUN: 14 mg/dL (ref 6–23)
CO2: 31 mEq/L (ref 19–32)
Calcium: 9.5 mg/dL (ref 8.4–10.5)
Chloride: 103 mEq/L (ref 96–112)
Glucose, Bld: 91 mg/dL (ref 70–99)
Potassium: 4.7 mEq/L (ref 3.5–5.3)

## 2012-09-13 LAB — CBC
HCT: 36.1 % — ABNORMAL LOW (ref 39.0–52.0)
Hemoglobin: 11.9 g/dL — ABNORMAL LOW (ref 13.0–17.0)
MCV: 90.9 fL (ref 78.0–100.0)
RBC: 3.97 MIL/uL — ABNORMAL LOW (ref 4.22–5.81)
RDW: 13.6 % (ref 11.5–15.5)
WBC: 5.4 10*3/uL (ref 4.0–10.5)

## 2012-09-13 MED ORDER — ROSUVASTATIN CALCIUM 10 MG PO TABS: ORAL_TABLET | ORAL | Status: DC

## 2012-09-13 NOTE — Patient Instructions (Signed)
Your physician has requested that you have an echocardiogram. Echocardiography is a painless test that uses sound waves to create images of your heart. It provides your doctor with information about the size and shape of your heart and how well your heart's chambers and valves are working. This procedure takes approximately one hour. There are no restrictions for this procedure.   Your physician has recommended that you have a cardiopulmonary  MET-TEST.  Your physician recommends that you schedule a follow-up appointment in: 6 weeks to discuss tests results.
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
## 2012-09-14 LAB — T4, FREE: Free T4: 0.85 ng/dL (ref 0.80–1.80)

## 2012-09-14 LAB — TSH: TSH: 1.11 u[IU]/mL (ref 0.350–4.500)

## 2012-09-20 ENCOUNTER — Telehealth: Payer: Self-pay | Admitting: *Deleted

## 2012-09-20 NOTE — Telephone Encounter (Signed)
Left message per Dr. [name] labs WNL.

## 2012-09-20 NOTE — Telephone Encounter (Signed)
Message copied by Gaynelle Cage on Fri Sep 20, 2012  2:12 PM ------      Message from: Nicki Guadalajara A      Created: Thu Sep 19, 2012  3:56 PM       Labs ok, unchanged ------

## 2012-09-24 ENCOUNTER — Ambulatory Visit (HOSPITAL_COMMUNITY)
Admission: RE | Admit: 2012-09-24 | Discharge: 2012-09-24 | Disposition: A | Discharge status: Home / Self Care | Payer: Medicare Other | Source: Ambulatory Visit | Attending: Cardiovascular Disease | Admitting: Cardiovascular Disease

## 2012-09-24 DIAGNOSIS — R0989 Other specified symptoms and signs involving the circulatory and respiratory systems: Secondary | ICD-10-CM | POA: Insufficient documentation

## 2012-09-24 DIAGNOSIS — R0609 Other forms of dyspnea: Secondary | ICD-10-CM | POA: Insufficient documentation

## 2012-09-24 DIAGNOSIS — I059 Rheumatic mitral valve disease, unspecified: Secondary | ICD-10-CM | POA: Insufficient documentation

## 2012-09-24 DIAGNOSIS — I10 Essential (primary) hypertension: Secondary | ICD-10-CM

## 2012-09-24 DIAGNOSIS — I079 Rheumatic tricuspid valve disease, unspecified: Secondary | ICD-10-CM | POA: Insufficient documentation

## 2012-09-24 DIAGNOSIS — R0602 Shortness of breath: Secondary | ICD-10-CM

## 2012-09-24 NOTE — Progress Notes (Signed)
2D Echo Performed 09/24/2012    Alexander [name], RCS  

## 2012-10-03 ENCOUNTER — Telehealth: Payer: Self-pay | Admitting: *Deleted

## 2012-10-03 NOTE — Telephone Encounter (Signed)
Informed patient per Dr. [name] vit-d and thyroid function okay.

## 2012-10-07 ENCOUNTER — Encounter (INDEPENDENT_AMBULATORY_CARE_PROVIDER_SITE_OTHER): Payer: Medicare Other

## 2012-10-07 DIAGNOSIS — R0602 Shortness of breath: Secondary | ICD-10-CM

## 2012-10-07 LAB — PULMONARY FUNCTION TEST

## 2012-10-14 ENCOUNTER — Telehealth: Payer: Self-pay | Admitting: Cardiovascular Disease

## 2012-10-14 NOTE — Telephone Encounter (Signed)
Returned call and spoke w/ pt's wife.  Informed results have not been reviewed by MD/PA and nurse will call after they are reviewed.  Verbalized understanding and agreed w/ plan.

## 2012-10-14 NOTE — Telephone Encounter (Signed)
Wants test results from his Met-test from last week please!

## 2012-10-22 ENCOUNTER — Emergency Department (HOSPITAL_COMMUNITY)
Admission: EM | Admit: 2012-10-22 | Discharge: 2012-10-22 | Disposition: A | Discharge status: Home / Self Care | Payer: Medicare Other | Attending: Emergency Medicine | Admitting: Emergency Medicine

## 2012-10-22 ENCOUNTER — Encounter (HOSPITAL_COMMUNITY): Payer: Self-pay | Admitting: Emergency Medicine

## 2012-10-22 ENCOUNTER — Emergency Department (HOSPITAL_COMMUNITY): Payer: Medicare Other

## 2012-10-22 DIAGNOSIS — Z9861 Coronary angioplasty status: Secondary | ICD-10-CM | POA: Insufficient documentation

## 2012-10-22 DIAGNOSIS — I10 Essential (primary) hypertension: Secondary | ICD-10-CM | POA: Diagnosis present

## 2012-10-22 DIAGNOSIS — J45901 Unspecified asthma with (acute) exacerbation: Secondary | ICD-10-CM | POA: Insufficient documentation

## 2012-10-22 DIAGNOSIS — R059 Cough, unspecified: Secondary | ICD-10-CM | POA: Insufficient documentation

## 2012-10-22 DIAGNOSIS — K921 Melena: Secondary | ICD-10-CM | POA: Insufficient documentation

## 2012-10-22 DIAGNOSIS — Z8719 Personal history of other diseases of the digestive system: Secondary | ICD-10-CM | POA: Insufficient documentation

## 2012-10-22 DIAGNOSIS — R05 Cough: Secondary | ICD-10-CM | POA: Insufficient documentation

## 2012-10-22 DIAGNOSIS — Z79899 Other long term (current) drug therapy: Secondary | ICD-10-CM | POA: Insufficient documentation

## 2012-10-22 DIAGNOSIS — R5381 Other malaise: Secondary | ICD-10-CM | POA: Insufficient documentation

## 2012-10-22 DIAGNOSIS — R053 Chronic cough: Secondary | ICD-10-CM | POA: Diagnosis present

## 2012-10-22 DIAGNOSIS — E785 Hyperlipidemia, unspecified: Secondary | ICD-10-CM | POA: Diagnosis present

## 2012-10-22 DIAGNOSIS — J449 Chronic obstructive pulmonary disease, unspecified: Secondary | ICD-10-CM

## 2012-10-22 DIAGNOSIS — Z87891 Personal history of nicotine dependence: Secondary | ICD-10-CM | POA: Insufficient documentation

## 2012-10-22 DIAGNOSIS — R079 Chest pain, unspecified: Secondary | ICD-10-CM | POA: Insufficient documentation

## 2012-10-22 DIAGNOSIS — R0602 Shortness of breath: Secondary | ICD-10-CM

## 2012-10-22 DIAGNOSIS — R093 Abnormal sputum: Secondary | ICD-10-CM | POA: Insufficient documentation

## 2012-10-22 DIAGNOSIS — Z8679 Personal history of other diseases of the circulatory system: Secondary | ICD-10-CM | POA: Insufficient documentation

## 2012-10-22 DIAGNOSIS — K5900 Constipation, unspecified: Secondary | ICD-10-CM | POA: Insufficient documentation

## 2012-10-22 DIAGNOSIS — E7800 Pure hypercholesterolemia, unspecified: Secondary | ICD-10-CM | POA: Insufficient documentation

## 2012-10-22 LAB — CBC
Hemoglobin: 12.9 g/dL — ABNORMAL LOW (ref 13.0–17.0)
MCV: 91.1 fL (ref 78.0–100.0)
Platelets: 259 10*3/uL (ref 150–400)
RBC: 4.26 MIL/uL (ref 4.22–5.81)
WBC: 4.9 10*3/uL (ref 4.0–10.5)

## 2012-10-22 LAB — PRO B NATRIURETIC PEPTIDE: Pro B Natriuretic peptide (BNP): 95.6 pg/mL (ref 0–450)

## 2012-10-22 LAB — BASIC METABOLIC PANEL
CO2: 27 mEq/L (ref 19–32)
Chloride: 104 mEq/L (ref 96–112)
Sodium: 140 mEq/L (ref 135–145)

## 2012-10-22 LAB — POCT I-STAT TROPONIN I: Troponin i, poc: 0.01 ng/mL (ref 0.00–0.08)

## 2012-10-22 MED ORDER — DEXAMETHASONE SODIUM PHOSPHATE 10 MG/ML IJ SOLN
10.0000 mg | Freq: Once | INTRAMUSCULAR | Status: AC
  Administered 2012-10-22: 10 mg via INTRAMUSCULAR
  Filled 2012-10-22: qty 1

## 2012-10-22 MED ORDER — AZITHROMYCIN 250 MG PO TABS: 250.0000 mg | ORAL_TABLET | Freq: Every day | ORAL | Status: DC

## 2012-10-22 NOTE — ED Notes (Signed)
Pt c/o SOB and productive cough x several weeks with yellow sputum; pt sts some CP worse with cough
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
## 2012-10-28 ENCOUNTER — Encounter: Payer: Self-pay | Admitting: Critical Care Medicine

## 2012-10-28 ENCOUNTER — Other Ambulatory Visit: Payer: Self-pay | Admitting: Cardiovascular Disease

## 2012-10-28 ENCOUNTER — Ambulatory Visit (INDEPENDENT_AMBULATORY_CARE_PROVIDER_SITE_OTHER): Payer: Medicare Other | Admitting: Critical Care Medicine

## 2012-10-28 VITALS — BP 112/70 | HR 81 | Temp 97.8°F | Ht 67.0 in | Wt 176.0 lb

## 2012-10-28 DIAGNOSIS — J441 Chronic obstructive pulmonary disease with (acute) exacerbation: Secondary | ICD-10-CM

## 2012-10-28 DIAGNOSIS — K219 Gastro-esophageal reflux disease without esophagitis: Secondary | ICD-10-CM

## 2012-10-28 MED ORDER — FLUTICASONE-SALMETEROL 250-50 MCG/DOSE IN AEPB: 1.0000 | INHALATION_SPRAY | Freq: Two times a day (BID) | RESPIRATORY_TRACT | Status: DC

## 2012-10-28 MED ORDER — LEVOFLOXACIN 500 MG PO TABS: 500.0000 mg | ORAL_TABLET | Freq: Every day | ORAL | Status: DC

## 2012-10-28 MED ORDER — OMEPRAZOLE 20 MG PO CPDR: 20.0000 mg | DELAYED_RELEASE_CAPSULE | Freq: Every day | ORAL | Status: DC

## 2012-10-28 MED ORDER — PREDNISONE 10 MG PO TABS: ORAL_TABLET | ORAL | Status: DC

## 2012-10-28 MED ORDER — METHYLPREDNISOLONE ACETATE 80 MG/ML IJ SUSP
120.0000 mg | Freq: Once | INTRAMUSCULAR | Status: AC
  Administered 2012-10-28: 120 mg via INTRAMUSCULAR

## 2012-10-28 NOTE — Telephone Encounter (Signed)
Rx was sent to pharmacy electronically. 

## 2012-10-28 NOTE — Assessment & Plan Note (Signed)
Gold C copd exacerbation with acute tracheobronchitis Plan Start Advair 250  one puff twice daily Start Prednisone 10mg  Take 4 for two days three for two days two for two days one for two days Take levaquin 500mg  daily for 7days Take omeprazole one daily before meals A Depomedrol injection 120mg  IM was given Return 2 weeks for recheck with Dr Vassie Loll or Rubye Oaks our NP

## 2012-10-28 NOTE — Progress Notes (Signed)
Subjective:    Patient ID: Alexander [name], male    DOB: January 10, 1933, 79 y.o.   MRN: [id number]  HPI  10/28/2012 Chief Complaint  Patient presents with  . Acute Visit    RA pt - last seen 2012.  c/o prod cough with thick, white mucus, wheezing, increased SOB, chest heaviness, and daytime sweats.  Symptoms started over the past month but are getting worse.  Was seen Monday at [facility] - was given zpak.  Finished this on Saturday.   Notes thick white mucus.  Notes more dyspnea. Notes some heartburn. Constant wheezing and coughing. No blood.     Review of Systems Constitutional:   No  weight loss, night sweats,  Fevers, chills, fatigue, lassitude. HEENT:   No headaches,  Difficulty swallowing,  Tooth/dental problems,  Sore throat,                No sneezing, itching, ear ache, nasal congestion, post nasal drip,   CV:  No chest pain,  Orthopnea, PND, swelling in lower extremities, anasarca, dizziness, palpitations  GI  No heartburn, indigestion, abdominal pain, nausea, vomiting, diarrhea, change in bowel habits, loss of appetite  Resp: +++shortness of breath with exertion +++ at rest.  No excess mucus, notes  productive cough,  No non-productive cough,  No coughing up of blood.  No change in color of mucus.  No wheezing.  No chest wall deformity  Skin: no rash or lesions.  GU: no dysuria, change in color of urine, no urgency or frequency.  No flank pain.  MS:  No joint pain or swelling.  No decreased range of motion.  No back pain.  Psych:  No change in mood or affect. No depression or anxiety.  No memory loss.     Objective:   Physical Exam Filed Vitals:   10/28/12 1356  BP: 112/70  Pulse: 81  Temp: 97.8 F (36.6 C)  TempSrc: Oral  Height: 5\' 7"  (1.702 m)  Weight: 176 lb (79.833 kg)  SpO2: 96%    Gen: Pleasant, well-nourished, in no distress,  normal affect  ENT: No lesions,  mouth clear,  oropharynx clear, no postnasal drip  Neck: No JVD, no TMG, no carotid  bruits  Lungs: No use of accessory muscles, no dullness to percussion, exp wheezes, rhonchi  Cardiovascular: RRR, heart sounds normal, no murmur or gallops, no peripheral edema  Abdomen: soft and NT, no HSM,  BS normal  Musculoskeletal: No deformities, no cyanosis or clubbing  Neuro: alert, non focal  Skin: Warm, no lesions or rashes  No results found.        Assessment & Plan:   Obstructive chronic bronchitis with exacerbation, Gold C Copd Gold C copd exacerbation with acute tracheobronchitis Plan Start Advair 250  one puff twice daily Start Prednisone 10mg  Take 4 for two days three for two days two for two days one for two days Take levaquin 500mg  daily for 7days Take omeprazole one daily before meals A Depomedrol injection 120mg  IM was given Return 2 weeks for recheck with Dr Vassie Loll or Rubye Oaks our NP    Updated Medication List Outpatient Encounter Prescriptions as of 10/28/2012  Medication Sig Dispense Refill  . albuterol (PROVENTIL HFA;VENTOLIN HFA) 108 (90 BASE) MCG/ACT inhaler Inhale 1 puff into the lungs every 6 (six) hours as needed for wheezing.      . hydrochlorothiazide (MICROZIDE) 12.5 MG capsule Take 12.5 mg by mouth daily.      . hydrOXYzine (ATARAX/VISTARIL) 25 MG  tablet Take 25 mg by mouth as needed for itching.      . metoprolol succinate (TOPROL-XL) 50 MG 24 hr tablet Take 50 mg by mouth daily. Take with or immediately following a meal.      . rosuvastatin (CRESTOR) 10 MG tablet Take 10 mg by mouth daily.      . [DISCONTINUED] losartan (COZAAR) 100 MG tablet Take 100 mg by mouth daily.      . Fluticasone-Salmeterol (ADVAIR) 250-50 MCG/DOSE AEPB Inhale 1 puff into the lungs 2 (two) times daily.  1 each  6  . Fluticasone-Salmeterol (ADVAIR) 250-50 MCG/DOSE AEPB Inhale 1 puff into the lungs every 12 (twelve) hours.  28 each  0  . levofloxacin (LEVAQUIN) 500 MG tablet Take 1 tablet (500 mg total) by mouth daily.  7 tablet  0  . omeprazole (PRILOSEC) 20 MG  capsule Take 1 capsule (20 mg total) by mouth daily.  30 capsule  4  . predniSONE (DELTASONE) 10 MG tablet Take 4 for two days three for two days two for two days one for two days  20 tablet  0  . [DISCONTINUED] azithromycin (ZITHROMAX) 250 MG tablet Take 1 tablet (250 mg total) by mouth daily. Take first 2 tablets together, then 1 every day until finished.  6 tablet  0  . [DISCONTINUED] Dextromethorphan-Guaifenesin (ROBITUSSIN DM) 10-100 MG/5ML liquid Take 5 mLs by mouth every 12 (twelve) hours.      . [EXPIRED] methylPREDNISolone acetate (DEPO-MEDROL) injection 120 mg        No facility-administered encounter medications on file as of 10/28/2012.

## 2012-10-28 NOTE — Patient Instructions (Addendum)
Start Advair one puff twice daily Start Prednisone 10mg  Take 4 for two days three for two days two for two days one for two days Take levaquin 500mg  daily for 7days Take omeprazole one daily before meals A Depomedrol injection 120mg  IM was given Return 2 weeks for recheck with Dr Vassie Loll or Rubye Oaks our NP

## 2012-10-29 ENCOUNTER — Ambulatory Visit: Payer: 59 | Admitting: Cardiovascular Disease

## 2012-10-30 ENCOUNTER — Encounter: Payer: Self-pay | Admitting: *Deleted

## 2012-10-30 NOTE — Progress Notes (Signed)
Patient had appointment to discuss this on 10/29/12.  Apparently he called and rescheduled it for 11/07/12.

## 2012-10-31 ENCOUNTER — Encounter: Payer: Self-pay | Admitting: Critical Care Medicine

## 2012-10-31 ENCOUNTER — Telehealth: Payer: Self-pay | Admitting: *Deleted

## 2012-10-31 MED ORDER — BUDESONIDE-FORMOTEROL FUMARATE 160-4.5 MCG/ACT IN AERO: 2.0000 | INHALATION_SPRAY | Freq: Two times a day (BID) | RESPIRATORY_TRACT | Status: DC

## 2012-10-31 NOTE — Telephone Encounter (Signed)
Received fax from [facility] [address]  Pt was seen on 10/28/12 by Dr. Delford Field and was started on advair 250.  Per [facility], insurance will not cover advair, but they will cover symbicort.  Per PW, ok to change advair to symbicort 160 2 puffs bid.    I called this med change into Justin at [facility] who verbalized understanding.  They will also ensure pt knows how to properly use the symbicort when he picks up the medication.    I also called and spoke with pt.  I explained above to him.  He verbalized understanding of this.  He is aware to use the symbicort 2 puffs bid and to rinse mouth well after each use.  He does have an albuterol hfa to use prn.  Pt is aware symbicort technique is the same as the albuterol hfa but he is to use the symbicort 2 puffs bid scheduled.  He verbalized understanding of instructions and voiced no further questions or concerns at this time.  ** Med list update and fax placed in scan folder.

## 2012-11-06 ENCOUNTER — Telehealth: Payer: Self-pay | Admitting: *Deleted

## 2012-11-06 NOTE — Telephone Encounter (Signed)
Message copied by Gaynelle Cage on Wed Nov 06, 2012  3:24 PM ------      Message from: Nicki Guadalajara A      Created: Sat Nov 02, 2012  3:31 PM       Echo good fxn, mild mr ------

## 2012-11-06 NOTE — Telephone Encounter (Signed)
Telephoned patient to give him his echo results and he informs me that he has appointment tomorrow with Dr. [name] to discuss.

## 2012-11-07 ENCOUNTER — Ambulatory Visit: Payer: Self-pay | Admitting: Cardiovascular Disease

## 2012-11-07 ENCOUNTER — Encounter: Payer: Self-pay | Admitting: *Deleted

## 2012-11-07 ENCOUNTER — Telehealth: Payer: Self-pay | Admitting: *Deleted

## 2012-11-07 NOTE — Telephone Encounter (Signed)
Patient had appointment scheduled to see Dr [name] today, however Dr [name] was called to the hospital for a STEMI. Patient was informed of this and was told that he would be seeing the PA, Judie Grieve if it was okay. Patient agreed. Went out to lobby to call patient back for appointment, and was informed by front office staff the patient changed his mind and  decided to leave.

## 2012-11-12 ENCOUNTER — Ambulatory Visit: Payer: Medicare Other | Admitting: Adult Health

## 2012-11-15 ENCOUNTER — Ambulatory Visit (INDEPENDENT_AMBULATORY_CARE_PROVIDER_SITE_OTHER): Payer: Medicare Other | Admitting: Adult Health

## 2012-11-15 ENCOUNTER — Encounter: Payer: Self-pay | Admitting: Adult Health

## 2012-11-15 VITALS — BP 98/58 | HR 63 | Temp 97.4°F | Ht 67.0 in | Wt 181.4 lb

## 2012-11-15 DIAGNOSIS — J441 Chronic obstructive pulmonary disease with (acute) exacerbation: Secondary | ICD-10-CM

## 2012-11-15 NOTE — Patient Instructions (Addendum)
Continue on current regimen  We will call pharmacy to check on coverage for symbicort  follow up Dr. Vassie Loll  In 6 weeks and As needed

## 2012-11-19 ENCOUNTER — Ambulatory Visit (INDEPENDENT_AMBULATORY_CARE_PROVIDER_SITE_OTHER): Payer: Medicare Other | Admitting: Cardiovascular Disease

## 2012-11-19 VITALS — BP 96/60 | HR 52 | Ht 67.0 in | Wt 179.1 lb

## 2012-11-19 DIAGNOSIS — R0602 Shortness of breath: Secondary | ICD-10-CM

## 2012-11-19 DIAGNOSIS — I517 Cardiomegaly: Secondary | ICD-10-CM

## 2012-11-19 NOTE — Assessment & Plan Note (Signed)
Recent flare now resolving  Cont on Symbicort  Will send prior authorization to pharmacy

## 2012-11-19 NOTE — Progress Notes (Signed)
  Subjective:    Patient ID: Alexander [name], male    DOB: 30-Sep-1932, 79 y.o.   MRN: [id number]  HPI 79/M, ex smoker for evaluation of chronic 'congestion'/ cough   c. cath [name]) -11/10 - nml LV fn, nml coronaries  Spirometry showed poor effort, unable to interpret.  c/o heartburn - takes protonix as needed  Did not tolerate Symbicort & spiriva   June 22, 2010 2:31 PM  Took wife's advair & felt much better,  C/o nausea in am  Denies chest pain, orthopnea, hemoptysis, fever, n/v/d, edema, headache,. >advair rx   11/15/12 Follow up  Returns for follow up for COPD flare  Seen 2 weeks ago tx with levaquin and steroid taper.  Returns feeling better. Reports breathing is 70%.  still having some lingering SOB, tightness.  recieved documentation that insurance will no longer cover his Symbicort . The letter is a prior authorization letter that we will take care for him.  He denies any hemoptysis, orthopnea, PND, or leg swelling. Chest x-ray 10/22/12  with no acute changes appear  Review of Systems Constitutional:   No  weight loss, night sweats,  Fevers, chills,  +fatigue, or  lassitude.  HEENT:   No headaches,  Difficulty swallowing,  Tooth/dental problems, or  Sore throat,                No sneezing, itching, ear ache,  +nasal congestion, post nasal drip,   CV:  No chest pain,  Orthopnea, PND, swelling in lower extremities, anasarca, dizziness, palpitations, syncope.   GI  No heartburn, indigestion, abdominal pain, nausea, vomiting, diarrhea, change in bowel habits, loss of appetite, bloody stools.   Resp:    No chest wall deformity  Skin: no rash or lesions.  GU: no dysuria, change in color of urine, no urgency or frequency.  No flank pain, no hematuria   MS:  No joint pain or swelling.  No decreased range of motion.  No back pain.  Psych:  No change in mood or affect. No depression or anxiety.  No memory loss.          Objective:   Physical Exam GEN: A/Ox3;  pleasant , NAD , elderly   HEENT:  [place]/AT,  EACs-clear, TMs-wnl, NOSE-clear, THROAT-clear, no lesions, no postnasal drip or exudate noted.   NECK:  Supple w/ fair ROM; no JVD; normal carotid impulses w/o bruits; no thyromegaly or nodules palpated; no lymphadenopathy.  RESP  Diminished BS in bases .no accessory muscle use, no dullness to percussion  CARD:  RRR, no m/r/g  , no peripheral edema, pulses intact, no cyanosis or clubbing.  GI:   Soft & nt; nml bowel sounds; no organomegaly or masses detected.  Musco: Warm bil, no deformities or joint swelling noted.   Neuro: alert, no focal deficits noted.    Skin: Warm, no lesions or rashes         Assessment & Plan:

## 2012-11-19 NOTE — Patient Instructions (Signed)
Your physician has recommended you make the following change in your medication: CUT THE METOPROLOL  INTO HALF. TAKE 1/2 TABLET DAILY.  Your physician recommends that you schedule a follow-up appointment in: 3 MONTHS

## 2012-12-01 ENCOUNTER — Encounter: Payer: Self-pay | Admitting: Cardiovascular Disease

## 2012-12-01 DIAGNOSIS — R0602 Shortness of breath: Secondary | ICD-10-CM | POA: Insufficient documentation

## 2012-12-01 DIAGNOSIS — I517 Cardiomegaly: Secondary | ICD-10-CM | POA: Insufficient documentation
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
## 2012-12-02 ENCOUNTER — Ambulatory Visit (INDEPENDENT_AMBULATORY_CARE_PROVIDER_SITE_OTHER): Payer: Medicare Other | Admitting: Emergency Medicine

## 2012-12-02 VITALS — BP 100/64 | HR 75 | Temp 97.8°F | Resp 17 | Ht 66.0 in | Wt 175.0 lb

## 2012-12-02 DIAGNOSIS — R252 Cramp and spasm: Secondary | ICD-10-CM

## 2012-12-02 LAB — POCT CBC
Granulocyte percent: 59.5 %G (ref 37–80)
Hemoglobin: 12.2 g/dL — AB (ref 14.1–18.1)
MCH, POC: 29.5 pg (ref 27–31.2)
MCV: 97.4 fL — AB (ref 80–97)
MID (cbc): 0.5 (ref 0–0.9)
Platelet Count, POC: 268 10*3/uL (ref 142–424)
RBC: 4.13 M/uL — AB (ref 4.69–6.13)
WBC: 5.4 10*3/uL (ref 4.6–10.2)

## 2012-12-02 NOTE — Patient Instructions (Addendum)
Muscle Cramps  Muscle cramps are due to sudden involuntary muscle contraction. This means you have no control over the tightening of a muscle (or muscles). Often there are no obvious causes. Muscle cramps may occur with overexertion. They may also occur with chilling of the muscles. An example of a muscle chilling activity is swimming. It is uncommon for cramps to be due to a serious underlying disorder. In most cases, muscle cramps improve (or leave) within minutes.  CAUSES   Some common causes are:   Injury.   Infections, especially viral.   Abnormal levels of the salts and ions in your blood (electrolytes). This could happen if you are taking water pills (diuretics).   Blood vessel disease where not enough blood is getting to the muscles (intermittent claudication).  Some uncommon causes are:   Side effects of some medicine (such as lithium).   Alcohol abuse.   Diseases where there is soreness (inflammation) of the muscular system.  HOME CARE INSTRUCTIONS    It may be helpful to massage, stretch, and relax the affected muscle.   Taking a dose of over-the-counter diphenhydramine is helpful for night leg cramps.  SEEK MEDICAL CARE IF:   Cramps are frequent and not relieved with medicine.  MAKE SURE YOU:    Understand these instructions.   Will watch your condition.   Will get help right away if you are not doing well or get worse.  Document Released: 09/30/2001 Document Revised: 07/03/2011 Document Reviewed: 04/01/2008  [employer] Patient Information 2014 [employer].
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
## 2012-12-03 LAB — COMPREHENSIVE METABOLIC PANEL
ALT: 45 U/L (ref 0–53)
AST: 23 U/L (ref 0–37)
Albumin: 4.4 g/dL (ref 3.5–5.2)
CO2: 31 mEq/L (ref 19–32)
Calcium: 9.6 mg/dL (ref 8.4–10.5)
Chloride: 104 mEq/L (ref 96–112)
Creat: 1.2 mg/dL (ref 0.50–1.35)
Potassium: 4.8 mEq/L (ref 3.5–5.3)
Sodium: 142 mEq/L (ref 135–145)
Total Protein: 7 g/dL (ref 6.0–8.3)

## 2012-12-03 LAB — CK: Total CK: 131 U/L (ref 7–232)

## 2012-12-23 ENCOUNTER — Encounter (HOSPITAL_COMMUNITY): Payer: Self-pay

## 2012-12-23 ENCOUNTER — Emergency Department (INDEPENDENT_AMBULATORY_CARE_PROVIDER_SITE_OTHER)
Admission: EM | Admit: 2012-12-23 | Discharge: 2012-12-23 | Disposition: A | Discharge status: Home / Self Care | Payer: Medicare Other | Source: Home / Self Care | Attending: Emergency Medicine | Admitting: Emergency Medicine

## 2012-12-23 DIAGNOSIS — K5909 Other constipation: Secondary | ICD-10-CM

## 2012-12-23 DIAGNOSIS — K5900 Constipation, unspecified: Secondary | ICD-10-CM
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
## 2012-12-23 NOTE — ED Notes (Signed)
States he has not had a good BM w/o use of laxative or stool softeners in 2 months or more. Has not had a sudden change in his symptoms. Last BM was 3 days ago. Wife is w him and is concerned this is not good for him. Patient is in NAD, denies pain at present

## 2012-12-23 NOTE — ED Provider Notes (Signed)
Medical screening examination/treatment/procedure(s) were performed by non-physician practitioner and as supervising physician I was immediately available for consultation/collaboration.  Leslee Home, M.D.  Reuben Likes, MD 12/23/12 [phone]

## 2012-12-25 ENCOUNTER — Telehealth: Payer: Self-pay | Admitting: Critical Care Medicine

## 2012-12-25 NOTE — Telephone Encounter (Signed)
At 7.25.14 ov with TP, pt brought letter from [employer] stating that after 8.15.14 his Symbicort 160 plan coverage will change.    Called [employer] @ the number provided on the letter [phone]) and spoke with representative Clydie Braun.  Per Clydie Braun, pt's symbicort is covered and no PA is needed.  Nothing further needed; will sign off.

## 2013-01-09 ENCOUNTER — Ambulatory Visit (INDEPENDENT_AMBULATORY_CARE_PROVIDER_SITE_OTHER): Payer: Medicare Other | Admitting: Pulmonary Disease

## 2013-01-09 ENCOUNTER — Encounter: Payer: Self-pay | Admitting: Pulmonary Disease

## 2013-01-09 VITALS — BP 110/64 | HR 53 | Temp 97.8°F | Ht 67.0 in | Wt 184.2 lb

## 2013-01-09 DIAGNOSIS — Z23 Encounter for immunization: Secondary | ICD-10-CM

## 2013-01-09 DIAGNOSIS — J441 Chronic obstructive pulmonary disease with (acute) exacerbation: Secondary | ICD-10-CM

## 2013-01-09 MED ORDER — PREDNISONE 10 MG PO TABS: ORAL_TABLET | ORAL | Status: DC

## 2013-01-09 MED ORDER — ALBUTEROL SULFATE HFA 108 (90 BASE) MCG/ACT IN AERS: 1.0000 | INHALATION_SPRAY | Freq: Four times a day (QID) | RESPIRATORY_TRACT | Status: DC | PRN

## 2013-01-09 NOTE — Progress Notes (Signed)
  Subjective:    Patient ID: Alexander [name], male    DOB: January 31, 1933, 79 y.o.   MRN: [id number]  HPI  79/M, ex smoker for FU of gold C COPD & dyspnea    01/09/2013  last seen by me  2012.  Seen 2 weeks ago for COPD flare tx with levaquin and steroid taper.   Chest x-ray 10/22/12 with no acute changes appear   Pt reports his breathing is slightly better but remains dyspneic. C/o chest congestion, at times coughs up clear phlem, clears throat often.  Cardiac evaluation - echo nml Stress test - peak VO2 68%, limited by ventilation c. cath [name]) -11/10 - nml LV fn, nml coronaries  Denies any wheezing and no chest tx. Denies any PND and no nasal congestion Spirometry >>FEV1 1.14 - 45% -moderate airway obstruction  Review of Systems neg for any significant sore throat, dysphagia, itching, sneezing, nasal congestion or excess/ purulent secretions, fever, chills, sweats, unintended wt loss, pleuritic or exertional cp, hempoptysis, orthopnea pnd or change in chronic leg swelling. Also denies presyncope, palpitations, heartburn, abdominal pain, nausea, vomiting, diarrhea or change in bowel or urinary habits, dysuria,hematuria, rash, arthralgias, visual complaints, headache, numbness weakness or ataxia.     Objective:   Physical Exam  Gen. Pleasant, well-nourished, in no distress ENT - no lesions, no post nasal drip Neck: No JVD, no thyromegaly, no carotid bruits Lungs: no use of accessory muscles, no dullness to percussion, clear without rales or rhonchi  Cardiovascular: Rhythm regular, heart sounds  normal, no murmurs or gallops, no peripheral edema Musculoskeletal: No deformities, no cyanosis or clubbing         Assessment & Plan:

## 2013-01-09 NOTE — Assessment & Plan Note (Signed)
Flu shot' You have moderate COPD - lung function is at 45% Stay on symbicort 2 puffs twice daily Rescue inhaler as needed - Rx & sample Prednisone 10 mg tabs  Take 2 tabs daily with food x 5ds, then 1 tab daily with food x 5ds then STOP If no bette ron FU , consider adding LAMA to current therapy

## 2013-01-09 NOTE — Patient Instructions (Addendum)
Flu shot' You have moderate COPD - lung function is at 45% Stay on symbicort 2 puffs twice daily Rescue inhaler as needed - Rx & sample Prednisone 10 mg tabs  Take 2 tabs daily with food x 5ds, then 1 tab daily with food x 5ds then STOP

## 2013-02-20 ENCOUNTER — Encounter: Payer: Self-pay | Admitting: Cardiovascular Disease

## 2013-02-20 ENCOUNTER — Ambulatory Visit (INDEPENDENT_AMBULATORY_CARE_PROVIDER_SITE_OTHER): Payer: Medicare Other | Admitting: Cardiovascular Disease

## 2013-02-20 VITALS — BP 110/70 | HR 59 | Ht 67.0 in | Wt 186.1 lb

## 2013-02-20 DIAGNOSIS — E782 Mixed hyperlipidemia: Secondary | ICD-10-CM

## 2013-02-20 DIAGNOSIS — I10 Essential (primary) hypertension: Secondary | ICD-10-CM

## 2013-02-20 DIAGNOSIS — E785 Hyperlipidemia, unspecified: Secondary | ICD-10-CM

## 2013-02-20 DIAGNOSIS — I517 Cardiomegaly: Secondary | ICD-10-CM

## 2013-02-20 DIAGNOSIS — R5383 Other fatigue: Secondary | ICD-10-CM

## 2013-02-20 DIAGNOSIS — R5381 Other malaise: Secondary | ICD-10-CM

## 2013-02-20 DIAGNOSIS — K219 Gastro-esophageal reflux disease without esophagitis: Secondary | ICD-10-CM

## 2013-02-20 LAB — LIPID PANEL
Cholesterol: 148 mg/dL (ref 0–200)
HDL: 56 mg/dL (ref >39)
Triglycerides: 73 mg/dL (ref <150)
VLDL: 15 mg/dL (ref 0–40)
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
## 2013-02-20 NOTE — Patient Instructions (Signed)
Your physician has recommended you make the following change in your medication: decrease the metoprolol down to 25 mg daily. (1/2 tablet)  Your physician recommends that you return for lab work today. Your physician recommends that you schedule a follow-up appointment in: 6 MONTHS.

## 2013-02-21 ENCOUNTER — Telehealth: Payer: Self-pay | Admitting: Cardiovascular Disease

## 2013-02-21 NOTE — Telephone Encounter (Signed)
Returned patient's call. Was confusion over Toprol xl dose. Reports was told to decrease from 50mg  to 25mg  (1/2 tablet) at OV in July and then was told the same thing yesterday (10/30) at OV. RN looked through documentation and it appeared as if at 10/30 appmt it was documented that patient was taking 50mg  tablet. Instructions per Dr. [name] on AVS indicated that patient should take 25mg  of metoprolol succ daily. RN informed patient of this and instructed patient to keep track of BPs and HR and if HR drops into low 50s or systolic BP nears 100 or lower to call our office, as patient stated Dr. [name] told him his BP was low (was 110/70 at OV 10/30). Patient agreed with plan & verbalized understanding.

## 2013-02-21 NOTE — Telephone Encounter (Signed)
Please call-need to verify Dr [name] instructions.

## 2013-02-24 LAB — VITAMIN D 1,25 DIHYDROXY
Vitamin D 1, 25 (OH)2 Total: 58 pg/mL (ref 18–72)
Vitamin D2 1, 25 (OH)2: <8 pg/mL
Vitamin D3 1, 25 (OH)2: 58 pg/mL

## 2013-02-26 ENCOUNTER — Encounter: Payer: Self-pay | Admitting: *Deleted

## 2013-02-26 ENCOUNTER — Telehealth: Payer: Self-pay | Admitting: Cardiovascular Disease

## 2013-02-26 NOTE — Telephone Encounter (Signed)
Would like lab results from last Thursday please.

## 2013-02-26 NOTE — Progress Notes (Signed)
Quick Note:  Note sent to patient ______ 

## 2013-02-26 NOTE — Telephone Encounter (Signed)
Returned call and spoke w/ wife.  Informed results have not been reviewed by MD/PA and nurse will call, mail letter or release in MyChart after they are reviewed.  Verbalized understanding and agreed w/ plan.  Wife stated pt has gotten worse and is in pain w/ SOB.  RN asked if pt's SOB has changed since he was seen.  Pt on phone and c/o increased SOB and constipation.  Reviewed chart and advised pt/wife to contact Dr. Reginia Naas office r/t SOB as pt has COPD, was started on Symbicort and Albuterol HFA.  Note states to call back if not better.  Pt/wife informed of this and agreed to call Dr. Reginia Naas office.  Also advised daily stool softener w/ increased water, increased fiber and avoid taking laxatives for constipation.  Informed RN will notify Dr. [name] r/t lab results.  Verbalized understanding.  Message forwarded to Dr. [name].

## 2013-02-28 ENCOUNTER — Encounter (HOSPITAL_COMMUNITY): Payer: Self-pay | Admitting: Emergency Medicine

## 2013-02-28 ENCOUNTER — Inpatient Hospital Stay (HOSPITAL_COMMUNITY)
Admission: EM | Admit: 2013-02-28 | Discharge: 2013-03-02 | DRG: 191 | Disposition: A | Discharge status: Home / Self Care | Payer: Medicare Other | Attending: Family Medicine | Admitting: Family Medicine

## 2013-02-28 ENCOUNTER — Emergency Department (HOSPITAL_COMMUNITY): Payer: Medicare Other

## 2013-02-28 DIAGNOSIS — R05 Cough: Secondary | ICD-10-CM

## 2013-02-28 DIAGNOSIS — Z79899 Other long term (current) drug therapy: Secondary | ICD-10-CM

## 2013-02-28 DIAGNOSIS — Z87891 Personal history of nicotine dependence: Secondary | ICD-10-CM

## 2013-02-28 DIAGNOSIS — Z791 Long term (current) use of non-steroidal anti-inflammatories (NSAID): Secondary | ICD-10-CM

## 2013-02-28 DIAGNOSIS — I10 Essential (primary) hypertension: Secondary | ICD-10-CM | POA: Diagnosis present

## 2013-02-28 DIAGNOSIS — K219 Gastro-esophageal reflux disease without esophagitis: Secondary | ICD-10-CM | POA: Diagnosis present

## 2013-02-28 DIAGNOSIS — R5383 Other fatigue: Secondary | ICD-10-CM

## 2013-02-28 DIAGNOSIS — E785 Hyperlipidemia, unspecified: Secondary | ICD-10-CM | POA: Diagnosis present

## 2013-02-28 DIAGNOSIS — J441 Chronic obstructive pulmonary disease with (acute) exacerbation: Principal | ICD-10-CM

## 2013-02-28 DIAGNOSIS — I517 Cardiomegaly: Secondary | ICD-10-CM | POA: Diagnosis present

## 2013-02-28 DIAGNOSIS — R5381 Other malaise: Secondary | ICD-10-CM

## 2013-02-28 DIAGNOSIS — E7800 Pure hypercholesterolemia, unspecified: Secondary | ICD-10-CM | POA: Diagnosis present

## 2013-02-28 DIAGNOSIS — R0602 Shortness of breath: Secondary | ICD-10-CM

## 2013-02-28 DIAGNOSIS — K5900 Constipation, unspecified: Secondary | ICD-10-CM | POA: Diagnosis present

## 2013-02-28 DIAGNOSIS — K921 Melena: Secondary | ICD-10-CM | POA: Diagnosis not present

## 2013-02-28 LAB — CBC WITH DIFFERENTIAL/PLATELET
Eosinophils Relative: 10 % — ABNORMAL HIGH (ref 0–5)
HCT: 38.5 % — ABNORMAL LOW (ref 39.0–52.0)
Hemoglobin: 12.7 g/dL — ABNORMAL LOW (ref 13.0–17.0)
Lymphocytes Relative: 27 % (ref 12–46)
MCHC: 33 g/dL (ref 30.0–36.0)
MCV: 94.4 fL (ref 78.0–100.0)
Monocytes Absolute: 0.7 10*3/uL (ref 0.1–1.0)
Monocytes Relative: 11 % (ref 3–12)
Neutro Abs: 2.9 10*3/uL (ref 1.7–7.7)
WBC: 5.8 10*3/uL (ref 4.0–10.5)

## 2013-02-28 LAB — POCT I-STAT TROPONIN I: Troponin i, poc: 0 ng/mL (ref 0.00–0.08)

## 2013-02-28 LAB — CBC
Hemoglobin: 12.4 g/dL — ABNORMAL LOW (ref 13.0–17.0)
MCH: 31.2 pg (ref 26.0–34.0)
MCHC: 33.1 g/dL (ref 30.0–36.0)
RDW: 12.8 % (ref 11.5–15.5)

## 2013-02-28 LAB — BLOOD GAS, ARTERIAL
Acid-Base Excess: 2.9 mmol/L — ABNORMAL HIGH (ref 0.0–2.0)
Bicarbonate: 28.7 mEq/L — ABNORMAL HIGH (ref 20.0–24.0)
Drawn by: 276051
TCO2: 26 mmol/L (ref 0–100)
pCO2 arterial: 52.2 mmHg — ABNORMAL HIGH (ref 35.0–45.0)
pO2, Arterial: 102 mmHg — ABNORMAL HIGH (ref 80.0–100.0)

## 2013-02-28 LAB — CREATININE, SERUM
Creatinine, Ser: 1.07 mg/dL (ref 0.50–1.35)
GFR calc non Af Amer: 65 mL/min — ABNORMAL LOW (ref >90)

## 2013-02-28 LAB — PRO B NATRIURETIC PEPTIDE: Pro B Natriuretic peptide (BNP): 64.3 pg/mL (ref 0–450)

## 2013-02-28 LAB — BASIC METABOLIC PANEL
BUN: 13 mg/dL (ref 6–23)
CO2: 30 mEq/L (ref 19–32)
Chloride: 101 mEq/L (ref 96–112)
Creatinine, Ser: 1.06 mg/dL (ref 0.50–1.35)

## 2013-02-28 LAB — TROPONIN I: Troponin I: <0.3 ng/mL (ref <0.30)

## 2013-02-28 MED ORDER — IPRATROPIUM BROMIDE 0.02 % IN SOLN: 0.5000 mg | RESPIRATORY_TRACT | Status: DC

## 2013-02-28 MED ORDER — SODIUM CHLORIDE 0.9 % IJ SOLN
3.0000 mL | Freq: Two times a day (BID) | INTRAMUSCULAR | Status: DC
  Administered 2013-02-28: 3 mL via INTRAVENOUS

## 2013-02-28 MED ORDER — METHYLPREDNISOLONE SODIUM SUCC 125 MG IJ SOLR
60.0000 mg | Freq: Four times a day (QID) | INTRAMUSCULAR | Status: DC
  Administered 2013-02-28 – 2013-03-02 (×8): 60 mg via INTRAVENOUS
  Filled 2013-02-28 (×12): qty 0.96

## 2013-02-28 MED ORDER — ONDANSETRON HCL 4 MG/2ML IJ SOLN: 4.0000 mg | Freq: Four times a day (QID) | INTRAMUSCULAR | Status: DC | PRN

## 2013-02-28 MED ORDER — HYDROCODONE-ACETAMINOPHEN 5-325 MG PO TABS: 1.0000 | ORAL_TABLET | ORAL | Status: DC | PRN

## 2013-02-28 MED ORDER — PANTOPRAZOLE SODIUM 20 MG PO TBEC
20.0000 mg | DELAYED_RELEASE_TABLET | Freq: Every morning | ORAL | Status: DC
  Administered 2013-02-28 – 2013-03-02 (×3): 20 mg via ORAL
  Filled 2013-02-28 (×3): qty 1

## 2013-02-28 MED ORDER — ONDANSETRON HCL 4 MG PO TABS: 4.0000 mg | ORAL_TABLET | Freq: Four times a day (QID) | ORAL | Status: DC | PRN

## 2013-02-28 MED ORDER — ALBUTEROL SULFATE (5 MG/ML) 0.5% IN NEBU
2.5000 mg | INHALATION_SOLUTION | Freq: Once | RESPIRATORY_TRACT | Status: AC
  Administered 2013-02-28: 2.5 mg via RESPIRATORY_TRACT
  Filled 2013-02-28: qty 0.5

## 2013-02-28 MED ORDER — LEVOFLOXACIN IN D5W 500 MG/100ML IV SOLN
500.0000 mg | INTRAVENOUS | Status: DC
  Administered 2013-02-28 – 2013-03-02 (×3): 500 mg via INTRAVENOUS
  Filled 2013-02-28 (×3): qty 100

## 2013-02-28 MED ORDER — SODIUM CHLORIDE 0.9 % IJ SOLN: 3.0000 mL | Freq: Two times a day (BID) | INTRAMUSCULAR | Status: DC

## 2013-02-28 MED ORDER — LOSARTAN POTASSIUM 50 MG PO TABS
100.0000 mg | ORAL_TABLET | Freq: Every morning | ORAL | Status: DC
  Administered 2013-02-28 – 2013-03-02 (×3): 100 mg via ORAL
  Filled 2013-02-28 (×3): qty 2

## 2013-02-28 MED ORDER — METOPROLOL SUCCINATE ER 25 MG PO TB24
25.0000 mg | ORAL_TABLET | Freq: Every morning | ORAL | Status: DC
  Administered 2013-02-28 – 2013-03-02 (×3): 25 mg via ORAL
  Filled 2013-02-28 (×3): qty 1

## 2013-02-28 MED ORDER — METHYLPREDNISOLONE SODIUM SUCC 125 MG IJ SOLR
125.0000 mg | Freq: Once | INTRAMUSCULAR | Status: AC
  Administered 2013-02-28: 125 mg via INTRAVENOUS
  Filled 2013-02-28: qty 2

## 2013-02-28 MED ORDER — ENOXAPARIN SODIUM 40 MG/0.4ML ~~LOC~~ SOLN
40.0000 mg | SUBCUTANEOUS | Status: DC
  Administered 2013-02-28 – 2013-03-01 (×2): 40 mg via SUBCUTANEOUS
  Filled 2013-02-28 (×3): qty 0.4

## 2013-02-28 MED ORDER — IPRATROPIUM BROMIDE 0.02 % IN SOLN
1.0000 mg | Freq: Once | RESPIRATORY_TRACT | Status: AC
  Administered 2013-02-28: 1 mg via RESPIRATORY_TRACT
  Filled 2013-02-28: qty 5

## 2013-02-28 MED ORDER — HYDROCHLOROTHIAZIDE 12.5 MG PO CAPS
12.5000 mg | ORAL_CAPSULE | Freq: Every morning | ORAL | Status: DC
  Administered 2013-02-28 – 2013-03-02 (×3): 12.5 mg via ORAL
  Filled 2013-02-28 (×3): qty 1

## 2013-02-28 MED ORDER — ALBUTEROL SULFATE (5 MG/ML) 0.5% IN NEBU
2.5000 mg | INHALATION_SOLUTION | RESPIRATORY_TRACT | Status: DC
  Administered 2013-02-28 (×4): 2.5 mg via RESPIRATORY_TRACT
  Filled 2013-02-28 (×4): qty 0.5

## 2013-02-28 MED ORDER — ALBUTEROL SULFATE (5 MG/ML) 0.5% IN NEBU: 5.0000 mg | INHALATION_SOLUTION | RESPIRATORY_TRACT | Status: DC

## 2013-02-28 MED ORDER — SODIUM CHLORIDE 0.9 % IV SOLN: 250.0000 mL | INTRAVENOUS | Status: DC | PRN

## 2013-02-28 MED ORDER — ASPIRIN 81 MG PO CHEW
324.0000 mg | CHEWABLE_TABLET | Freq: Once | ORAL | Status: AC
  Administered 2013-02-28: 324 mg via ORAL
  Filled 2013-02-28: qty 4

## 2013-02-28 MED ORDER — BUDESONIDE-FORMOTEROL FUMARATE 160-4.5 MCG/ACT IN AERO
2.0000 | INHALATION_SPRAY | Freq: Two times a day (BID) | RESPIRATORY_TRACT | Status: DC
  Administered 2013-02-28 – 2013-03-02 (×4): 2 via RESPIRATORY_TRACT
  Filled 2013-02-28 (×2): qty 6

## 2013-02-28 MED ORDER — BISACODYL 5 MG PO TBEC
5.0000 mg | DELAYED_RELEASE_TABLET | Freq: Every day | ORAL | Status: DC | PRN
  Administered 2013-02-28 – 2013-03-01 (×2): 5 mg via ORAL
  Filled 2013-02-28 (×2): qty 1

## 2013-02-28 MED ORDER — IPRATROPIUM BROMIDE 0.02 % IN SOLN
0.5000 mg | RESPIRATORY_TRACT | Status: DC
  Administered 2013-02-28 (×4): 0.5 mg via RESPIRATORY_TRACT
  Filled 2013-02-28 (×4): qty 2.5

## 2013-02-28 MED ORDER — SODIUM CHLORIDE 0.9 % IJ SOLN: 3.0000 mL | INTRAMUSCULAR | Status: DC | PRN

## 2013-02-28 MED ORDER — MAGNESIUM HYDROXIDE 400 MG/5ML PO SUSP
30.0000 mL | Freq: Once | ORAL | Status: AC
  Administered 2013-02-28: 22:00:00 30 mL via ORAL
  Filled 2013-02-28: qty 30

## 2013-02-28 MED ORDER — ALBUTEROL (5 MG/ML) CONTINUOUS INHALATION SOLN
20.0000 mg | INHALATION_SOLUTION | RESPIRATORY_TRACT | Status: DC
  Administered 2013-02-28: 20 mg via RESPIRATORY_TRACT
  Filled 2013-02-28: qty 20

## 2013-02-28 MED ORDER — ATORVASTATIN CALCIUM 20 MG PO TABS
20.0000 mg | ORAL_TABLET | Freq: Every day | ORAL | Status: DC
  Administered 2013-02-28 – 2013-03-01 (×2): 20 mg via ORAL
  Filled 2013-02-28 (×4): qty 1

## 2013-02-28 MED ORDER — POLYETHYLENE GLYCOL 3350 17 G PO PACK
17.0000 g | PACK | Freq: Every day | ORAL | Status: DC
  Administered 2013-02-28 – 2013-03-02 (×2): 17 g via ORAL
  Filled 2013-02-28 (×3): qty 1
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
## 2013-02-28 NOTE — ED Provider Notes (Signed)
TIME SEEN: 7:10 AM  CHIEF COMPLAINT: Shortness of breath, cough  HPI: Patient is a 79 year old male with history of hypertension, hyperlipidemia, COPD not on chronic oxygen who presents the emergency department with 2 weeks of progressively worsening shortness of breath and cough with green sputum production. He states that he has an aching diffusely across his chest when he coughs. He states his shortness of breath is worse with exertion. Denies any fevers. No lower extremity swelling or pain. No prior history of MI, cardiac stents, cardiac catheterization. Reports his last stress test was a few months ago. No prior history of PE or DVT. No history of prolonged immobilization such as long flight or hospitalization, fracture, trauma, surgery or history of cancer. Patient quit smoking approximately 13 years ago. He did smoke for 48 years.   PCP is [facility]  ROS: See HPI Constitutional: no fever  Eyes: no drainage  ENT: no runny nose   Cardiovascular:  chest pain  Resp: SOB  GI: no vomiting GU: no dysuria Integumentary: no rash  Allergy: no hives  Musculoskeletal: no leg swelling  Neurological: no slurred speech ROS otherwise negative  PAST MEDICAL HISTORY/PAST SURGICAL HISTORY:  Past Medical History  Diagnosis Date  . Hypertension   . High cholesterol   . Asthma   . GERD (gastroesophageal reflux disease)   . LVH (left ventricular hypertrophy)     W/T-WAVE ABNORMALITIES  . DOE (dyspnea on exertion)   . Constipation   . Hemorrhoids     MEDICATIONS:  Prior to Admission medications   Medication Sig Start Date End Date Taking? Authorizing Provider  hydrochlorothiazide (MICROZIDE) 12.5 MG capsule Take 12.5 mg by mouth daily.    Historical Provider, MD  hydrOXYzine (ATARAX/VISTARIL) 25 MG tablet Take 25 mg by mouth as needed for itching.    Historical Provider, MD  losartan (COZAAR) 100 MG tablet TAKE 1 TABLET ONCE DAILY. 10/28/12   Lennette Bihari, MD  metoprolol  succinate (TOPROL-XL) 50 MG 24 hr tablet Take 50 mg by mouth daily. Take with or immediately following a meal.    Historical Provider, MD  pantoprazole (PROTONIX) 20 MG tablet Take 20 mg by mouth daily.    Historical Provider, MD  rosuvastatin (CRESTOR) 10 MG tablet Take 10 mg by mouth daily.    Historical Provider, MD    ALLERGIES:  No Known Allergies  SOCIAL HISTORY:  History  Substance Use Topics  . Smoking status: Former Smoker -- 0.25 packs/day for 48 years    Types: Cigarettes    Quit date: 04/24/2000  . Smokeless tobacco: Never Used  . Alcohol Use: Yes     Comment: OCCASIONAL (GIN)    FAMILY HISTORY: Family History  Problem Relation Age of Onset  . Cancer Mother     PANCREATIC    EXAM: BP 127/77  Pulse 67  Temp(Src) 97.7 F (36.5 C) (Oral)  Resp 16  SpO2 96% CONSTITUTIONAL: Alert and oriented and responds appropriately to questions. Well-appearing; well-nourished HEAD: Normocephalic EYES: Conjunctivae clear, PERRL ENT: normal nose; no rhinorrhea; moist mucous membranes; pharynx without lesions noted NECK: Supple, no meningismus, no LAD  CARD: RRR; S1 and S2 appreciated; no murmurs, no clicks, no rubs, no gallops RESP: Normal chest excursion without splinting; patient is mildly tachypneic but speaks full sentences and has no increased worker breathing, no respiratory distress, decreased breath sounds bilaterally with poor aeration diffusely, minimal expiratory wheeze ABD/GI: Normal bowel sounds; non-distended; soft, non-tender, no rebound, no guarding BACK:  The  back appears normal and is non-tender to palpation, there is no CVA tenderness EXT: Normal ROM in all joints; non-tender to palpation; no edema; normal capillary refill; no cyanosis    SKIN: Normal color for age and race; warm NEURO: Moves all extremities equally PSYCH: The patient's mood and manner are appropriate. Grooming and personal hygiene are appropriate.  MEDICAL DECISION MAKING: Patient here with  likely COPD exacerbation. Will obtain labs including troponin. We'll obtain chest x-ray. We'll give duo nebs, Solu-Medrol, aspirin. Patient may need admission as he now has a new oxygen requirement.  ED PROGRESS: Pt has mild CO2 retention but is compensating appropriately. Labs otherwise unremarkable. Troponin negative. Chest x-ray shows emphysematous changes but no infiltrate or edema.  Patient currently receiving continuous albuterol treatment.  8:22 AM  Pt has some very mild improvement with aeration and now increased expiratory wheezing bilaterally. He reports some mild symptom improvement after approximately 30 minutes of continuous albuterol. Will continue treatment and monitoring.  9:02 AM  Lungs now clear to auscultation with good air movement except mild exp wheeze at bases.  O2 sats 94-95% at rest.  Will ambulate with pulse oximetry.  9:50 AM  Pt's oxygen saturation drops to the mid 80s with ambulation and he becomes very symptomatic. Will admit for COPD exacerbation.    EKG Interpretation     Ventricular Rate:  76 PR Interval:  141 QRS Duration: 98 QT Interval:  338 QTC Calculation: 380 R Axis:   85 Text Interpretation:  Sinus rhythm Borderline right axis deviation Abnrm T, consider ischemia, anterolateral lds Baseline wander in lead(s) V5 V6             Gregg Holster N Lucile Hillmann, DO 02/28/13 1008

## 2013-02-28 NOTE — Progress Notes (Signed)
ANTIBIOTIC CONSULT NOTE - INITIAL  Pharmacy Consult for Levaquin Indication: COPD exacerbation  No Known Allergies  Patient Measurements: Height: 5\' 7"  (170.2 cm) Weight: 174 lb 13.2 oz (79.3 kg) IBW/kg (Calculated) : 66.1  Vital Signs: Temp: 97.9 F (36.6 C) (11/07 1121) Temp src: Oral (11/07 1121) BP: 117/67 mmHg (11/07 1121) Pulse Rate: 75 (11/07 1121) Intake/Output from previous day:   Intake/Output from this shift:    Labs:  Recent Labs  02/28/13 0705  WBC 5.8  HGB 12.7*  PLT 277  CREATININE 1.06   Estimated Creatinine Clearance: 54.6 ml/min (by C-G formula based on Cr of 1.06). No results found for this basename: Rolm Gala, VANCORANDOM, GENTTROUGH, GENTPEAK, GENTRANDOM, TOBRATROUGH, TOBRAPEAK, TOBRARND, AMIKACINPEAK, AMIKACINTROU, AMIKACIN,  in the last 72 hours    Assessment: 89 yoM with h/o COPD presented 11/7 with c/o progressively worsening SOB and productive cough. CXR with no acute cardiopulmonary disease. MD order Levaquin per pharmacy dosing for COPD exacerbation.   11/7 >> Levaquin >>  Tmax: afeb  WBCs: wnl Renal: SCr 1.06, CG 55   Plan:   Levaquin 500 mg IV q24h  Pharmacy will f/u  Geoffry Paradise, PharmD, BCPS Pager: [phone] 11:34 AM Pharmacy #: [id number]

## 2013-02-28 NOTE — Care Management Note (Signed)
    Page 1 of 1   02/28/2013     12:30:44 PM   CARE MANAGEMENT NOTE 02/28/2013  Patient:  Alexander [name], Alexander [name]   Account Number:  [id number]  Date Initiated:  02/28/2013  Documentation initiated by:  Lanier Clam  Subjective/Objective Assessment:   79 Y/O M ADMITTED W/COPD.ZO:XWRU.     Action/Plan:   FROM HOME.HAS PCP,PHARMACY.   Anticipated DC Date:  03/02/2013   Anticipated DC Plan:  HOME/SELF CARE      DC Planning Services  CM consult      Choice offered to / List presented to:             Status of service:  In process, will continue to follow Medicare Important Message given?   (If response is "NO", the following Medicare IM given date fields will be blank) Date Medicare IM given:   Date Additional Medicare IM given:    Discharge Disposition:    Per UR Regulation:  Reviewed for med. necessity/level of care/duration of stay  If discussed at Long Length of Stay Meetings, dates discussed:    Comments:  02/28/13 [facility] Ellanora Rayborn RN,BSN NCM [phone]

## 2013-02-28 NOTE — ED Notes (Signed)
Patient with c/o SOB "for a few weeks now" per family member Patient with audible insp/exp wheezing  88% on RA--patient placed on 2L [place] with O2 sat at 100%

## 2013-02-28 NOTE — ED Notes (Signed)
Neb tx completed Bilateral insp/exp wheezing still present, but has improved after Albuterol tx given Patient reports a productive cough with "a green tinge to it and it has a terrible odor." PIV started  Patient remains on monitor

## 2013-02-28 NOTE — ED Notes (Signed)
Patient taken to CXR.

## 2013-02-28 NOTE — ED Notes (Signed)
Dr. Ward at the bedside.  

## 2013-02-28 NOTE — ED Notes (Signed)
Patient is alert and oriented x3.  He is complaining of shortness of breath with a green productive cough. Currently he rates his pain 5 of 10.

## 2013-03-01 DIAGNOSIS — I10 Essential (primary) hypertension: Secondary | ICD-10-CM

## 2013-03-01 DIAGNOSIS — R05 Cough: Secondary | ICD-10-CM

## 2013-03-01 LAB — COMPREHENSIVE METABOLIC PANEL
ALT: 17 U/L (ref 0–53)
AST: 21 U/L (ref 0–37)
Albumin: 4.1 g/dL (ref 3.5–5.2)
Alkaline Phosphatase: 57 U/L (ref 39–117)
CO2: 29 mEq/L (ref 19–32)
Chloride: 97 mEq/L (ref 96–112)
GFR calc non Af Amer: 52 mL/min — ABNORMAL LOW (ref >90)
Potassium: 3.9 mEq/L (ref 3.5–5.1)
Sodium: 136 mEq/L (ref 135–145)
Total Bilirubin: 0.5 mg/dL (ref 0.3–1.2)
Total Protein: 7.2 g/dL (ref 6.0–8.3)

## 2013-03-01 LAB — HEMOGLOBIN AND HEMATOCRIT, BLOOD
HCT: 35.5 % — ABNORMAL LOW (ref 39.0–52.0)
Hemoglobin: 11.9 g/dL — ABNORMAL LOW (ref 13.0–17.0)
Hemoglobin: 11.9 g/dL — ABNORMAL LOW (ref 13.0–17.0)

## 2013-03-01 LAB — CBC
MCV: 93.5 fL (ref 78.0–100.0)
Platelets: 262 10*3/uL (ref 150–400)
RBC: 3.72 MIL/uL — ABNORMAL LOW (ref 4.22–5.81)
RDW: 12.8 % (ref 11.5–15.5)
WBC: 13.6 10*3/uL — ABNORMAL HIGH (ref 4.0–10.5)

## 2013-03-01 MED ORDER — IPRATROPIUM BROMIDE 0.02 % IN SOLN
0.5000 mg | Freq: Four times a day (QID) | RESPIRATORY_TRACT | Status: DC
  Administered 2013-03-01 – 2013-03-02 (×6): 0.5 mg via RESPIRATORY_TRACT
  Filled 2013-03-01 (×6): qty 2.5

## 2013-03-01 MED ORDER — ALBUTEROL SULFATE (5 MG/ML) 0.5% IN NEBU
2.5000 mg | INHALATION_SOLUTION | Freq: Four times a day (QID) | RESPIRATORY_TRACT | Status: DC
  Administered 2013-03-01 – 2013-03-02 (×6): 2.5 mg via RESPIRATORY_TRACT
  Filled 2013-03-01 (×6): qty 0.5

## 2013-03-01 MED ORDER — FLEET ENEMA 7-19 GM/118ML RE ENEM
1.0000 | ENEMA | Freq: Every day | RECTAL | Status: DC | PRN
  Administered 2013-03-01: 1 via RECTAL
  Filled 2013-03-01: qty 1

## 2013-03-01 NOTE — Progress Notes (Signed)
Ambulated pt on RA and found sats to be 90-94%. Pt SOB at times. Encouraged deep breathing. Will continue to monitor pt.  Arta Bruce [facility] 03/01/2013

## 2013-03-01 NOTE — Progress Notes (Signed)
Pt and nurse tech reported to RN that pt had a small BM after his enema and that the water in the toilet was bright red. RN not able to visualize because the toilet had been flushed. MD made aware of findings and orders placed to check Hgb levels. Will continue to monitor pt's BM's. Pt asked to save any future BM's for the RN to assess. Pt agrees to do this.   Arta Bruce [facility] 03/01/2013
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
## 2013-03-02 ENCOUNTER — Inpatient Hospital Stay (HOSPITAL_COMMUNITY): Payer: Medicare Other

## 2013-03-02 LAB — HEMOGLOBIN AND HEMATOCRIT, BLOOD
HCT: 35.2 % — ABNORMAL LOW (ref 39.0–52.0)
Hemoglobin: 11.4 g/dL — ABNORMAL LOW (ref 13.0–17.0)

## 2013-03-02 MED ORDER — PREDNISONE 10 MG PO TABS: ORAL_TABLET | ORAL | Status: DC

## 2013-03-02 MED ORDER — ALBUTEROL SULFATE (2.5 MG/3ML) 0.083% IN NEBU: 2.5000 mg | INHALATION_SOLUTION | Freq: Four times a day (QID) | RESPIRATORY_TRACT | Status: DC | PRN

## 2013-03-02 MED ORDER — IPRATROPIUM BROMIDE 0.02 % IN SOLN: 0.5000 mg | RESPIRATORY_TRACT | Status: DC | PRN

## 2013-03-02 MED ORDER — LEVOFLOXACIN 500 MG PO TABS: 500.0000 mg | ORAL_TABLET | Freq: Every day | ORAL | Status: DC

## 2013-03-02 MED ORDER — BENZONATATE 100 MG PO CAPS
100.0000 mg | ORAL_CAPSULE | Freq: Three times a day (TID) | ORAL | Status: DC | PRN
  Administered 2013-03-02: 04:00:00 100 mg via ORAL
  Filled 2013-03-02: qty 1

## 2013-03-02 MED ORDER — DOCUSATE SODIUM 100 MG PO CAPS: 100.0000 mg | ORAL_CAPSULE | Freq: Two times a day (BID) | ORAL | Status: DC
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
## 2013-03-02 NOTE — Progress Notes (Signed)
Contacted [facility] DME rep for delivery of Nebulizer Machine for home. Isidoro Donning RN CCM Case Mgmt phone [phone]

## 2013-03-03 ENCOUNTER — Other Ambulatory Visit: Payer: Self-pay | Admitting: Cardiovascular Disease

## 2013-03-03 NOTE — Telephone Encounter (Signed)
Rx was sent to pharmacy electronically. 

## 2013-03-03 NOTE — Progress Notes (Signed)
Quick Note:  Results released into mychart. ______ 

## 2013-03-26 ENCOUNTER — Other Ambulatory Visit: Payer: Self-pay | Admitting: Cardiovascular Disease

## 2013-03-26 NOTE — Telephone Encounter (Signed)
Rx was sent to pharmacy electronically. 

## 2013-04-11 ENCOUNTER — Ambulatory Visit (INDEPENDENT_AMBULATORY_CARE_PROVIDER_SITE_OTHER): Payer: Medicare Other | Admitting: Adult Health

## 2013-04-11 ENCOUNTER — Encounter: Payer: Self-pay | Admitting: Adult Health

## 2013-04-11 VITALS — BP 98/64 | HR 89 | Temp 99.1°F | Ht 67.0 in | Wt 180.0 lb

## 2013-04-11 DIAGNOSIS — J441 Chronic obstructive pulmonary disease with (acute) exacerbation: Secondary | ICD-10-CM

## 2013-04-11 MED ORDER — DOXYCYCLINE HYCLATE 100 MG PO TABS: 100.0000 mg | ORAL_TABLET | Freq: Two times a day (BID) | ORAL | Status: DC

## 2013-04-11 MED ORDER — PREDNISONE 10 MG PO TABS: ORAL_TABLET | ORAL | Status: DC

## 2013-04-11 MED ORDER — TIOTROPIUM BROMIDE MONOHYDRATE 18 MCG IN CAPS: 18.0000 ug | ORAL_CAPSULE | Freq: Every day | RESPIRATORY_TRACT | Status: DC

## 2013-04-11 NOTE — Patient Instructions (Addendum)
Doxycycline 100mg  Twice daily  For 7 days  Mucinex DM Twice daily  As needed  Cough/congestion  Prednisone taper over next week.  Begin Tudorza 1 puff Twice daily   Fluids and rest  follow up Dr. Vassie Loll  In 6 weeks and As needed   Please contact office for sooner follow up if symptoms do not improve or worsen or seek emergency care

## 2013-04-11 NOTE — Progress Notes (Signed)
  Subjective:    Patient ID: Alexander [name], male    DOB: 31-Mar-1933, 79 y.o.   MRN: [id number]  HPI 80/M, ex smoker for FU of gold C COPD & dyspnea   01/09/13  last seen by me  2012.  Seen 2 weeks ago for COPD flare tx with levaquin and steroid taper.   Chest x-ray 10/22/12 with no acute changes appear   Pt reports his breathing is slightly better but remains dyspneic. C/o chest congestion, at times coughs up clear phlem, clears throat often.  Cardiac evaluation - echo nml Stress test - peak VO2 68%, limited by ventilation c. cath [name]) -11/10 - nml LV fn, nml coronaries  Denies any wheezing and no chest tx. Denies any PND and no nasal congestion Spirometry >>FEV1 1.14 - 45% -moderate airway obstruction >>Pred taper  04/11/2013 Acute OV  Complains of prod cough with brown/green mucus, wheezing, throat clearing, chest tightness, increased SOB x2 weeks. Denies f/c/s, hemoptysis, nausea, vomiting. Was in hospital 6 weeks for COPD flare , tx w/ levaquin and steroids .  DOE is getting worse over last few months.    Review of Systems  neg for any significant sore throat, dysphagia, itching, sneezing, nasal congestion or excess/ purulent secretions, fever, chills, sweats, unintended wt loss, pleuritic or exertional cp, hempoptysis, orthopnea pnd or change in chronic leg swelling. Also denies presyncope, palpitations, heartburn, abdominal pain, nausea, vomiting, diarrhea or change in bowel or urinary habits, dysuria,hematuria, rash, arthralgias, visual complaints, headache, numbness weakness or ataxia.     Objective:   Physical Exam   Gen. Pleasant, well-nourished, in no distress ENT - no lesions, no post nasal drip Neck: No JVD, no thyromegaly, no carotid bruits Lungs: no use of accessory muscles, no dullness to percussion, few rhonchi  Cardiovascular: Rhythm regular, heart sounds  normal, no murmurs or gallops, no peripheral edema Musculoskeletal: No deformities, no cyanosis or  clubbing         Assessment & Plan:

## 2013-04-11 NOTE — Assessment & Plan Note (Addendum)
Doxycycline 100mg  Twice daily  For 7 days  Mucinex DM Twice daily  As needed  Cough/congestion  Prednisone taper over next week.  Begin [place] American Twice daily   Fluids and rest  Follow up Dr. Vassie Loll  In 6 weeks and As needed   Please contact office for sooner follow up if symptoms do not improve or worsen or seek emergency care

## 2013-04-14 ENCOUNTER — Telehealth: Payer: Self-pay | Admitting: Pulmonary Disease

## 2013-04-14 NOTE — Telephone Encounter (Signed)
I called and spoke with spouse. She reports pt has been taking spiriva but does not feel he is getting as much from this as before. Pt was giving sample of tudorza at OV with TP on 04/11/13. When they went to the pharmacy spiriva was ready for pick up so pt thought he was just to take the spiriva. He did not take the [place]. Pt spouse is wanting to know what pt should do since he did not take the tudorza and if pt is needing RX. The office note did not say for pt to stop spiriva. Please advise TP thanks

## 2013-04-14 NOTE — Telephone Encounter (Signed)
TP had changed pt from Spiriva to [place] simply because we had no Spiriva samples in the office for me to show him how to use.  Had advised pt to not pick up the Spiriva that had been sent to the pharmacy, but by the time I had called the pharmacy to d/c this and change to [place] the patient had already picked up the rx.  I then tried to call the patient x2 to discuss with him but there was no answer.  Called spoke with spouse, discussed with her in detail.  She described how pt is using the Spiriva (she reportedly had read the insert instructions) and it does sound like pt is using this correctly.  Pt was advised to brish/rinse/gargle after each use.  Pt was advised to NOT take the Tudorza with the Spiriva as these are the same medication.  Spouse stated that she and patient are both aware of these and had already been practicing.  Pt reported he feels "100% better" since beginning the Spiriva.  Pt to keep 1.13.15 appt with TP and call if anything is needed prior to appt. Med list updated  Nothing further needed; will sign off.

## 2013-04-21 ENCOUNTER — Other Ambulatory Visit: Payer: Self-pay | Admitting: Cardiovascular Disease

## 2013-04-21 NOTE — Telephone Encounter (Signed)
Rx was sent to pharmacy electronically. 

## 2013-05-01 ENCOUNTER — Telehealth: Payer: Self-pay | Admitting: Cardiovascular Disease

## 2013-05-01 NOTE — Telephone Encounter (Signed)
Returned phone call to patient. He has some concerns about his crestor having to be ordered through [employer]. Patient states that he cannot "do this." he says the insurance company goes into his checking account and "mess with my money." I informed patient that we will be happy to send his prescriptions wherever he wants us to. This is something that has to be worked out between [employer] insurance company and him. Patient voiced his understanding.

## 2013-05-01 NOTE — Telephone Encounter (Signed)
Please have Burna MortimerWanda to call him-he deinitely need to talk to her-it about something she had told him.

## 2013-05-01 NOTE — Telephone Encounter (Signed)
Message forwarded to Wanda, CMA.  

## 2013-05-06 ENCOUNTER — Ambulatory Visit (INDEPENDENT_AMBULATORY_CARE_PROVIDER_SITE_OTHER): Payer: Medicare Other | Admitting: Adult Health

## 2013-05-06 ENCOUNTER — Encounter: Payer: Self-pay | Admitting: Adult Health

## 2013-05-06 ENCOUNTER — Telehealth: Payer: Self-pay | Admitting: *Deleted

## 2013-05-06 VITALS — BP 108/62 | HR 67 | Temp 97.4°F | Ht 67.0 in | Wt 180.0 lb

## 2013-05-06 DIAGNOSIS — J441 Chronic obstructive pulmonary disease with (acute) exacerbation: Secondary | ICD-10-CM

## 2013-05-06 MED ORDER — ROSUVASTATIN CALCIUM 10 MG PO TABS: 10.0000 mg | ORAL_TABLET | Freq: Every day | ORAL | Status: DC

## 2013-05-06 NOTE — Progress Notes (Signed)
  Subjective:    Patient ID: Alexander [name], male    DOB: 08-Oct-1932, 80 y.o.   MRN: [id number]  HPI 80/M, ex smoker for FU of gold C COPD & dyspnea   01/09/13  last seen by me  2012.  Seen 2 weeks ago for COPD flare tx with levaquin and steroid taper.   Chest x-ray 10/22/12 with no acute changes appear   Pt reports his breathing is slightly better but remains dyspneic. C/o chest congestion, at times coughs up clear phlem, clears throat often.  Cardiac evaluation - echo nml Stress test - peak VO2 68%, limited by ventilation c. cath [name] -11/10 - nml LV fn, nml coronaries  Denies any wheezing and no chest tx. Denies any PND and no nasal congestion Spirometry >>FEV1 1.14 - 45% -moderate airway obstruction >>Pred taper  04/11/13  Acute OV  Complains of prod cough with brown/green mucus, wheezing, throat clearing, chest tightness, increased SOB x2 weeks. Denies f/c/s, hemoptysis, nausea, vomiting. Was in hospital 6 weeks for COPD flare , tx w/ levaquin and steroids .  DOE is getting worse over last few months.  >>doxycycline , tudorza rx   05/06/2013 Follow up  Patient returns for a one-month followup for COPD. Last visit. Patient with a slow to resolve COPD, exacerbation. He was treated with doxycycline x7 days. Patient reports he is improved, however, continues to have an occasional cough with clear to yellow mucus, on and off. Now on [place] (insurance would not cover [place])  He does feel that his shortness, of breath is some better. Unfortunately, patient stopped. His Symbicort. He is taking Spiriva daily.. [name] helps him with his medications. He is confused with his inhalers. He denies any hemoptysis, chest pain, orthopnea, PND, or leg swelling.  Review of Systems  neg for any significant sore throat, dysphagia, itching, sneezing, nasal congestion or excess/ purulent secretions, fever, chills, sweats, unintended wt loss, pleuritic or exertional cp, hempoptysis,  orthopnea pnd or change in chronic leg swelling. Also denies presyncope, palpitations, heartburn, abdominal pain, nausea, vomiting, diarrhea or change in bowel or urinary habits, dysuria,hematuria, rash, arthralgias, visual complaints, headache, numbness weakness or ataxia.     Objective:   Physical Exam   Gen. Pleasant, elderly in no distress ENT - no lesions, no post nasal drip Neck: No JVD, no thyromegaly, no carotid bruits Lungs: no use of accessory muscles, no dullness to percussion, no wheezing  Cardiovascular: Rhythm regular, heart sounds  normal, no murmurs or gallops, no peripheral edema Musculoskeletal: No deformities, no cyanosis or clubbing         Assessment & Plan:

## 2013-05-06 NOTE — Assessment & Plan Note (Signed)
Restart Symbicort 2 puffs Twice daily  , rinse after use.  Continue on Spiriva 1 puff daily. Stop Atrovent nebulizers. May use ProAir Inhaler 2 puffs every 4 hours as needed. For wheezing and shortness of breath May use albuterol nebulizer every 4 hours as needed for wheezing, shortness, of breath Followup with Dr. Alva in 6-8 weeks and as needed Please contact office for sooner follow up if symptoms do not improve or worsen or seek emergency care   

## 2013-05-06 NOTE — Telephone Encounter (Signed)
Pt/wife in office about Crestor refill.  Informed refill sent on 12.29.14 and pharmacy confirmed it was received and pt did not want to pay copay.  Pt/wife stated copay went from $23 to $200 and they cannot afford that.  Want to know if pt can be switched to something that is more affordable.  Informed RN will check for samples and send message to Dr. Tresa EndoKelly for further instructions.  Verbalized understanding and agreed w/ plan.  Samples of Crestor 10 mg given x 4 weeks.  Message forwarded to Dr. Tresa EndoKelly for further instructions.

## 2013-05-06 NOTE — Patient Instructions (Signed)
Restart Symbicort 2 puffs Twice daily  , rinse after use.  Continue on Spiriva 1 puff daily. Stop Atrovent nebulizers. May use ProAir Inhaler 2 puffs every 4 hours as needed. For wheezing and shortness of breath May use albuterol nebulizer every 4 hours as needed for wheezing, shortness, of breath Followup with Dr. Vassie LollAlva in 6-8 weeks and as needed Please contact office for sooner follow up if symptoms do not improve or worsen or seek emergency care

## 2013-05-07 ENCOUNTER — Telehealth: Payer: Self-pay | Admitting: Pharmacist Clinician (PhC)/ Clinical Pharmacy Specialist

## 2013-05-07 ENCOUNTER — Telehealth: Payer: Self-pay | Admitting: Pulmonary Disease

## 2013-05-07 MED ORDER — ATORVASTATIN CALCIUM 20 MG PO TABS
20.0000 mg | ORAL_TABLET | Freq: Every day | ORAL | Status: DC
Start: 2013-05-07 — End: 2013-05-20

## 2013-05-07 NOTE — Telephone Encounter (Signed)
Called spoke with patient's spouse who reported that pt developed sores in/aroud this mouth since beginning the spiriva x1 week.  Spouse stated that they forgot to mention this to TP at yesterday's visit.  Spouse reports one sore on his lip onset yesterday and one on the top of his head.  Denies any pain, itching, dyspnea, chest tightness, wheezing, congestion, difficulty swallowing, changes in vision or dysphagia.  Pt also began Symbicort yesterday.  RA nor TP in the office this afternoon. Will forward to doc of the day -- Dr Shelle Ironlance please advise, thank you.  Per last ov: Patient Instructions     Restart Symbicort 2 puffs Twice daily , rinse after use.  Continue on Spiriva 1 puff daily.  Stop Atrovent nebulizers.  May use ProAir Inhaler 2 puffs every 4 hours as needed. For wheezing and shortness of breath  May use albuterol nebulizer every 4 hours as needed for wheezing, shortness, of breath  Followup with Dr. Vassie LollAlva in 6-8 weeks and as needed  Please contact office for sooner follow up if symptoms do not improve or worsen or seek emergency care

## 2013-05-07 NOTE — Telephone Encounter (Signed)
Insurance to no longer cover Crestor 10mg .  Will switch patient to atorvastatin 20mg .  Spoke with patient and advised him of change.

## 2013-05-07 NOTE — Telephone Encounter (Signed)
Pt spouse is fine with a call back in the AM and we can forward this message to TP. Please advise thanks

## 2013-05-08 NOTE — Telephone Encounter (Signed)
That is atypical for spiriva ,  Are they painful ? Shingles with head lesion  Doubt it is spiriva , may need to be seen by PCP for rash  Please contact office for sooner follow up if symptoms do not improve or worsen or seek emergency care  Make sure no difficulty swallowing , tongue swelling or resp issues ?????

## 2013-05-08 NOTE — Telephone Encounter (Signed)
Called and spoke with pts wife and she stated that the rash is only is painful if he touches this area.  She is aware of TP recs and they will call the primary care doctor to follow up.  Pt denied any difficutly swallowing, resp issues and no tongue swelling.

## 2013-05-12 ENCOUNTER — Other Ambulatory Visit: Payer: Self-pay | Admitting: Cardiovascular Disease

## 2013-05-20 ENCOUNTER — Encounter: Payer: Self-pay | Admitting: Internal Medicine

## 2013-05-20 ENCOUNTER — Ambulatory Visit (INDEPENDENT_AMBULATORY_CARE_PROVIDER_SITE_OTHER): Payer: Medicare Other | Admitting: Internal Medicine

## 2013-05-20 VITALS — BP 110/68 | HR 62 | Temp 97.8°F | Ht 65.75 in | Wt 182.0 lb

## 2013-05-20 DIAGNOSIS — J449 Chronic obstructive pulmonary disease, unspecified: Secondary | ICD-10-CM

## 2013-05-20 DIAGNOSIS — E785 Hyperlipidemia, unspecified: Secondary | ICD-10-CM

## 2013-05-20 DIAGNOSIS — J4489 Other specified chronic obstructive pulmonary disease: Secondary | ICD-10-CM

## 2013-05-20 DIAGNOSIS — Z23 Encounter for immunization: Secondary | ICD-10-CM

## 2013-05-20 DIAGNOSIS — H9209 Otalgia, unspecified ear: Secondary | ICD-10-CM

## 2013-05-20 DIAGNOSIS — I10 Essential (primary) hypertension: Secondary | ICD-10-CM

## 2013-05-20 MED ORDER — ANTIPYRINE-BENZOCAINE 5.4-1.4 % OT SOLN: 3.0000 [drp] | OTIC | Status: DC | PRN

## 2013-05-20 NOTE — Assessment & Plan Note (Signed)
Gold C symptoms - daily dyspnea, follows close with pulm for same Restart trial Spiriva 04/2013 - ?side effects such as angioedema and penile pain - Advised unlikely related but to monitor and contact pulm for concerns or recurrence Continue Symbicort with albuterol MDI/nebulizer as needed Currently, symptoms at baseline without exacerbation

## 2013-05-20 NOTE — Progress Notes (Signed)
Pre-visit discussion using our clinic review tool. No additional management support is needed unless otherwise documented below in the visit note.  

## 2013-05-20 NOTE — Assessment & Plan Note (Signed)
On Crestor therapy as advised by cardiology Tolerating well Last lipids at goal check annually and titrate as needed

## 2013-05-20 NOTE — Progress Notes (Signed)
Subjective:    Patient ID: Alexander [name], male    DOB: 15-Aug-1932, 80 y.o.   MRN: [id number]  HPI  New patient to me, here to establish primary care provider -previously followed with Dr Elias Else at [facility]  COPD, chronic dyspnea symptoms -working with pulmonary on same. Hospitalization for exacerbation November 2014 reviewed. Newly began on Spiriva inhaler and family concerned about side effects, potential allergies with episode of half lower lip swelling after first use and temporary genital pain intermittent lasting under 12 hours following inhalation of Spiriva - reports compliance with other medications as prescribed. No change in cough nature or chronic dyspnea on exertion  HTN - the patient reports compliance with medication(s) as prescribed. Denies adverse side effects.  Dyslipidemia - on statin - the patient reports compliance with medication(s) as prescribed. Denies adverse side effects.  GERD - uses rx PPI prn, not daily   Past Medical History  Diagnosis Date  . Hypertension   . Asthma   . LVH (left ventricular hypertrophy)     W/T-WAVE ABNORMALITIES  . Constipation   . Hemorrhoids   . COPD (chronic obstructive pulmonary disease)     chronic dyspnea  . HYPERLIPIDEMIA    Family History  Problem Relation Age of Onset  . Cancer Mother     PANCREATIC   History  Substance Use Topics  . Smoking status: Former Smoker -- 0.25 packs/day for 48 years    Types: Cigarettes    Quit date: 04/24/2000  . Smokeless tobacco: Never Used  . Alcohol Use: Yes     Comment: OCCASIONAL (GIN)    Review of Systems  Constitutional: Negative for fever, diaphoresis, activity change, appetite change, fatigue and unexpected weight change.  HENT: Positive for ear pain (L>R, intermittent x 2 weeks). Negative for hearing loss, mouth sores and postnasal drip.   Eyes: Negative for visual disturbance.  Respiratory: Positive for cough (chronic) and shortness of breath (chronic). Negative  for chest tightness and wheezing.   Cardiovascular: Negative for chest pain, palpitations and leg swelling.  Gastrointestinal: Positive for constipation (chronic). Negative for nausea, vomiting, abdominal pain, diarrhea and rectal pain.  Genitourinary: Positive for difficulty urinating, penile pain (4 days ago intermittent <12h, then spont resolved - radiated into testicles but no trauma or swelling or lesions) and testicular pain. Negative for dysuria, frequency, flank pain, discharge, penile swelling, scrotal swelling and genital sores. Urgency: ? Decreased urine volume: ?  Musculoskeletal: Negative for arthralgias and back pain.  Neurological: Negative for dizziness, syncope, weakness, light-headedness, numbness and headaches.  Psychiatric/Behavioral: Negative for dysphoric mood. The patient is not nervous/anxious.   All other systems reviewed and are negative.       Objective:   Physical Exam BP 110/68  Pulse 62  Temp(Src) 97.8 F (36.6 C) (Oral)  Ht 5' 5.75" (1.67 m)  Wt 182 lb (82.555 kg)  BMI 29.60 kg/m2  SpO2 96% Wt Readings from Last 3 Encounters:  05/20/13 182 lb (82.555 kg)  05/06/13 180 lb (81.647 kg)  04/11/13 180 lb (81.647 kg)   Constitutional: he appears well-developed and well-nourished. No distress. Wife at side HENT: Head: Normocephalic and atraumatic. Ears: B TMs ok, no erythema or effusion; Nose: Nose normal. Mouth/Throat: Oropharynx is clear and moist. No oropharyngeal exudate.  Eyes: Conjunctivae and EOM are normal. Pupils are equal, round, and reactive to light. No scleral icterus.  Neck: Normal range of motion. Neck supple. No JVD present. No thyromegaly present.  Cardiovascular: Normal rate, regular rhythm and  normal heart sounds.  No murmur heard. No BLE edema. Pulmonary/Chest: Effort normal and breath sounds normal. No respiratory distress. he has no wheezes.  Abdominal: Soft. Bowel sounds are normal. he exhibits no distension. There is no tenderness. no  masses GU: performed and supervised by Herby AbrahamAlan Benda PA-S: uncircumcised male, bilaterally descended testicles without mass or tenderness. No scrotal edema. Rectal exam reveals internal hemorrhoids without bleeding or thrombosis. Normal-sized prostate without tenderness, bogginess or nodules. Musculoskeletal: Normal range of motion, no joint effusions. No gross deformities Neurological: he is alert and oriented to person, place, and time. No cranial nerve deficit. Coordination, balance, strength, speech and gait are normal.  Skin: Skin is warm and dry. No rash noted. No erythema.  Psychiatric: he has a normal mood and affect. behavior is normal. Judgment and thought content normal.   Lab Results  Component Value Date   WBC 13.6* 03/01/2013   HGB 11.4* 03/02/2013   HCT 35.2* 03/02/2013   PLT 262 03/01/2013   GLUCOSE 143* 03/01/2013   CHOL 148 02/20/2013   TRIG 73 02/20/2013   HDL 56 02/20/2013   LDLCALC 77 02/20/2013   ALT 17 03/01/2013   AST 21 03/01/2013   NA 136 03/01/2013   K 3.9 03/01/2013   CL 97 03/01/2013   CREATININE 1.29 03/01/2013   BUN 18 03/01/2013   CO2 29 03/01/2013   TSH 1.381 02/20/2013       Assessment & Plan:   See problem list. Medications and labs reviewed today.  Time spent with pt/family today 45 minutes, greater than 50% time spent counseling patient on COPD, hypertension, dyslipidemia and medication review. Also review of prior records  Ear pain, intermittent - ?dry skin - TMs clear and no concerning hx or exam changes - topical analgesia advised - to call if worse or unimproved   Transient testicle pain 4 days ago, spontaneously resolved in <12h without recurrence - no prior hx same - GU and prostate exam today normal - Explained symptoms UNLIKELY related to Spiriva use but to contact pulm if continued concerns (?lip swelling after 1st use but no recurrence)

## 2013-05-20 NOTE — Patient Instructions (Addendum)
It was good to see you today.  We have reviewed your prior records including labs and tests today  Medications reviewed and updated, no changes recommended at this time.  Pneumonia vaccine updated today  Please contact your lung specialist if continued concerns or problems with Spiriva, but no changes recommended today  Followup in 6 months for annual exam and labs if needed, please call sooner if problems  Hypertension As your heart beats, it forces blood through your arteries. This force is your blood pressure. If the pressure is too high, it is called hypertension (HTN) or high blood pressure. HTN is dangerous because you may have it and not know it. High blood pressure may mean that your heart has to work harder to pump blood. Your arteries may be narrow or stiff. The extra work puts you at risk for heart disease, stroke, and other problems.  Blood pressure consists of two numbers, a higher number over a lower, 110/72, for example. It is stated as "110 over 72." The ideal is below 120 for the top number (systolic) and under 80 for the bottom (diastolic). Write down your blood pressure today. You should pay close attention to your blood pressure if you have certain conditions such as:  Heart failure.  Prior heart attack.  Diabetes  Chronic kidney disease.  Prior stroke.  Multiple risk factors for heart disease. To see if you have HTN, your blood pressure should be measured while you are seated with your arm held at the level of the heart. It should be measured at least twice. A one-time elevated blood pressure reading (especially in the Emergency Department) does not mean that you need treatment. There may be conditions in which the blood pressure is different between your right and left arms. It is important to see your caregiver soon for a recheck. Most people have essential hypertension which means that there is not a specific cause. This type of high blood pressure may be lowered  by changing lifestyle factors such as:  Stress.  Smoking.  Lack of exercise.  Excessive weight.  Drug/tobacco/alcohol use.  Eating less salt. Most people do not have symptoms from high blood pressure until it has caused damage to the body. Effective treatment can often prevent, delay or reduce that damage. TREATMENT  When a cause has been identified, treatment for high blood pressure is directed at the cause. There are a large number of medications to treat HTN. These fall into several categories, and your caregiver will help you select the medicines that are best for you. Medications may have side effects. You should review side effects with your caregiver. If your blood pressure stays high after you have made lifestyle changes or started on medicines,   Your medication(s) may need to be changed.  Other problems may need to be addressed.  Be certain you understand your prescriptions, and know how and when to take your medicine.  Be sure to follow up with your caregiver within the time frame advised (usually within two weeks) to have your blood pressure rechecked and to review your medications.  If you are taking more than one medicine to lower your blood pressure, make sure you know how and at what times they should be taken. Taking two medicines at the same time can result in blood pressure that is too low. SEEK IMMEDIATE MEDICAL CARE IF:  You develop a severe headache, blurred or changing vision, or confusion.  You have unusual weakness or numbness, or a faint feeling.  You have severe chest or abdominal pain, vomiting, or breathing problems. MAKE SURE YOU:   Understand these instructions.  Will watch your condition.  Will get help right away if you are not doing well or get worse. Document Released: 04/10/2005 Document Revised: 07/03/2011 Document Reviewed: 11/29/2007 [facility] Patient Information 2014 [place].

## 2013-05-20 NOTE — Assessment & Plan Note (Signed)
BP Readings from Last 3 Encounters:  05/20/13 110/68  05/06/13 108/62  04/11/13 98/64   The current medical regimen is effective;  continue present plan and medications.

## 2013-05-21 ENCOUNTER — Telehealth: Payer: Self-pay | Admitting: Internal Medicine

## 2013-05-21 NOTE — Telephone Encounter (Signed)
Relevant patient education assigned to patient using Emmi. ° °

## 2013-05-28 ENCOUNTER — Ambulatory Visit: Payer: Medicare Other | Admitting: Pulmonary Disease

## 2013-06-11 ENCOUNTER — Ambulatory Visit (INDEPENDENT_AMBULATORY_CARE_PROVIDER_SITE_OTHER): Payer: Medicare Other | Admitting: Pulmonary Disease

## 2013-06-11 ENCOUNTER — Encounter: Payer: Self-pay | Admitting: Pulmonary Disease

## 2013-06-11 ENCOUNTER — Telehealth: Payer: Self-pay | Admitting: Internal Medicine

## 2013-06-11 VITALS — BP 132/76 | HR 80 | Temp 96.8°F | Ht 67.0 in | Wt 184.8 lb

## 2013-06-11 DIAGNOSIS — J449 Chronic obstructive pulmonary disease, unspecified: Secondary | ICD-10-CM

## 2013-06-11 NOTE — Patient Instructions (Signed)
STOP spiriva Start atrovent nebs twice daily INSTEAD (can mix with albuterol) We discussed study for COPD medications & will have research nurse contact you

## 2013-06-11 NOTE — Telephone Encounter (Signed)
Notified pt with md response. Pt made appt for Friday 06/13/13...Raechel Chute/lmb

## 2013-06-11 NOTE — Assessment & Plan Note (Signed)
STOP spiriva Start atrovent nebs twice daily INSTEAD (can mix with albuterol) We discussed IMPACTstudy for COPD medications & will have research nurse contact you

## 2013-06-11 NOTE — Telephone Encounter (Signed)
Sorry. We did not discuss this medication at his office visit with me and this medication is not on his med list. He will need a followup visit with me to discuss this medical diagnosis and prescription

## 2013-06-11 NOTE — Telephone Encounter (Signed)
06/11/2013  Pt is requesting an script for rx Viagra.   Please contact pt if there are any questions.

## 2013-06-11 NOTE — Progress Notes (Signed)
   Subjective:    Patient ID: Alexander [name], male    DOB: 09-05-1932, 80 y.o.   MRN: [id number]  HPI  80/M, ex smoker for FU of gold C COPD & dyspnea   01/09/13  Cardiac evaluation - echo nml  Stress test - peak VO2 68%, limited by ventilation  c. cath [name] -11/10 - nml LV fn, nml coronaries  Spirometry >>FEV1 1.14 - 45% -moderate airway obstruction    06/11/2013  Chief Complaint  Patient presents with  . Follow-up    Pt states breathing is better. Chest pain mid upper sternal area. Chest tightness with activity. Denies wheezing, cough.     04/11/13 Acute OV >>doxycycline , tudorza rx  [place]  He does feel that his shortness, of breath is some better. [name] helps him with his medications. He is confused with his inhalers.  He denies any hemoptysis, chest pain, orthopnea, PND, or leg swelling. He  developed sores in/aroud this mouth since beginning the spiriva x1 week. Also c/o brown spots around his legs  Review of Systems neg for any significant sore throat, dysphagia, itching, sneezing, nasal congestion or excess/ purulent secretions, fever, chills, sweats, unintended wt loss, pleuritic or exertional cp, hempoptysis, orthopnea pnd or change in chronic leg swelling. Also denies presyncope, palpitations, heartburn, abdominal pain, nausea, vomiting, diarrhea or change in bowel or urinary habits, dysuria,hematuria, rash, arthralgias, visual complaints, headache, numbness weakness or ataxia.     Objective:   Physical Exam  Gen. Pleasant, well-nourished, in no distress ENT - no lesions, no post nasal drip Neck: No JVD, no thyromegaly, no carotid bruits Lungs: no use of accessory muscles, no dullness to percussion, clear without rales or rhonchi  Cardiovascular: Rhythm regular, heart sounds  normal, no murmurs or gallops, no peripheral edema Musculoskeletal: No deformities, no cyanosis or clubbing         Assessment  & Plan:

## 2013-06-13 ENCOUNTER — Ambulatory Visit (INDEPENDENT_AMBULATORY_CARE_PROVIDER_SITE_OTHER): Payer: Medicare Other | Admitting: Internal Medicine

## 2013-06-13 ENCOUNTER — Encounter: Payer: Self-pay | Admitting: Internal Medicine

## 2013-06-13 VITALS — BP 120/82 | HR 68 | Temp 97.3°F | Wt 185.4 lb

## 2013-06-13 DIAGNOSIS — N529 Male erectile dysfunction, unspecified: Secondary | ICD-10-CM

## 2013-06-13 MED ORDER — SILDENAFIL CITRATE 100 MG PO TABS: 50.0000 mg | ORAL_TABLET | Freq: Every day | ORAL | Status: DC | PRN

## 2013-06-13 NOTE — Progress Notes (Signed)
Pre-visit discussion using our clinic review tool. No additional management support is needed unless otherwise documented below in the visit note.  

## 2013-06-13 NOTE — Assessment & Plan Note (Signed)
The patient desires Viagra to treat his erectile dysfunction. He has used same in past without adverse side effects (rx'd by prior PCP) and requests refills now. History and physical exam has not disclosed any obvious treatable cause of this complaint.  He is informed that Viagra is sometimes not covered by insurance. It is available on a fee-for-service cost basis, and is relatively expensive. He can start with 50 mg dose, and increase to 100 mg if necessary. The method of use 1 hour prior to anticipated intercourse is explained. He should not use any more than one tablet in a 24 hour period. The side effects of possible headache, flushing, dyspepsia and transient changes in vision have been explained.   The patient is not taking nitrates, and denies he has access to nitrates in any form at any time. I have counseled him that taking Viagra with nitrates of any form can cause death.   We have also discussed the fact that there have been some deaths in patients after taking Viagra, felt due to the exertion of intercourse rather than the drug itself. The patient is aware of this, and accepts whatever unknown degree of risk there is in this aspect.

## 2013-06-13 NOTE — Patient Instructions (Addendum)
It was good to see you today.  We have reviewed your prior records including labs and tests today  Medications reviewed and updated Okay for Viagra as needed - Your prescription(s) have been submitted to your pharmacy. Please take as directed and contact our office if you believe you are having problem(s) with the medication(s). No other changes recommended at this time.  Please keep scheduled followup as planned, call sooner if problems.  Erectile Dysfunction Erectile dysfunction is the inability to get or sustain a good enough erection to have sexual intercourse. Erectile dysfunction may involve:  Inability to get an erection.  Lack of enough hardness to allow penetration.  Loss of the erection before sex is finished.  Premature ejaculation. CAUSES  Certain drugs, such as:  Pain relievers.  Antihistamines.  Antidepressants.  Blood pressure medicines.  Water pills (diuretics).  Ulcer medicines.  Muscle relaxants.  Illegal drugs.  Excessive drinking.  Psychological causes, such as:  Anxiety.  Depression.  Sadness.  Exhaustion.  Performance fear.  Stress.  Physical causes, such as:  Artery problems. This may include diabetes, smoking, liver disease, or atherosclerosis.  High blood pressure.  Hormonal problems, such as low testosterone.  Obesity.  Nerve problems. This may include back or pelvic injuries, diabetes mellitus, multiple sclerosis, or Parkinson disease. SYMPTOMS  Inability to get an erection.  Lack of enough hardness to allow penetration.  Loss of the erection before sex is finished.  Premature ejaculation.  Normal erections at some times, but with frequent unsatisfactory episodes.  Orgasms that are not satisfactory in sensation or frequency.  Low sexual satisfaction in either partner because of erection problems.  A curved penis occurring with erection. The curve may cause pain or may be too curved to allow for  intercourse.  Never having nighttime erections. DIAGNOSIS Your caregiver can often diagnose this condition by:  Performing a physical exam to find other diseases or specific problems with the penis.  Asking you detailed questions about the problem.  Performing blood tests to check for diabetes mellitus or to measure hormone levels.  Performing urine tests to find other underlying health conditions.  Performing an ultrasound exam to check for scarring.  Performing a test to check blood flow to the penis.  Doing a sleep study at home to measure nighttime erections. TREATMENT   You may be prescribed medicines by mouth.  You may be given medicine injections into the penis.  You may be prescribed a vacuum pump with a ring.  Penile implant surgery may be performed. You may receive:  An inflatable implant.  A semirigid implant.  Blood vessel surgery may be performed. HOME CARE INSTRUCTIONS  If you are prescribed oral medicine, you should take the medicine as prescribed. Do not increase the dosage without first discussing it with your physician.  If you are using self-injections, be careful to avoid any veins that are on the surface of the penis. Apply pressure to the injection site for 5 minutes.  If you are using a vacuum pump, make sure you have read the instructions before using it. Discuss any questions with your physician before taking the pump home. SEEK MEDICAL CARE IF:  You experience pain that is not responsive to the pain medicine you have been prescribed.  You experience nausea or vomiting. SEEK IMMEDIATE MEDICAL CARE IF:   When taking oral or injectable medications, you experience an erection that lasts longer than 4 hours. If your physician is unavailable, go to the nearest emergency room for evaluation.  An erection that lasts much longer than 4 hours can result in permanent damage to your penis.  You have pain that is severe.  You develop redness, severe  pain, or severe swelling of your penis.  You have redness spreading up into your groin or lower abdomen.  You are unable to pass your urine. Document Released: 04/07/2000 Document Revised: 12/11/2012 Document Reviewed: 09/12/2012 [facility] Patient Information 2014 [place], [place].
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
## 2013-06-16 ENCOUNTER — Other Ambulatory Visit: Payer: Self-pay | Admitting: *Deleted

## 2013-06-16 NOTE — Telephone Encounter (Signed)
Received fax pt needing PA on his viagra. Completed on cover by meds waiting on approval status...Raechel Chute/lmb

## 2013-06-18 NOTE — Telephone Encounter (Signed)
Received PA back med has been approved notified pharmacy spoke with Tobi Bastosanna gave her approval status...Raechel Chute/lmb

## 2013-06-20 ENCOUNTER — Telehealth: Payer: Self-pay | Admitting: Internal Medicine

## 2013-06-20 NOTE — Telephone Encounter (Signed)
Patient is calling to check the status of Prior Auth for Viagra. Please advise.

## 2013-06-20 NOTE — Telephone Encounter (Signed)
Med has been approve see refill encounter 06/16/13 pharmacy was also notified...Raechel Chute/lmb

## 2013-07-03 ENCOUNTER — Other Ambulatory Visit: Payer: Self-pay | Admitting: Adult Health

## 2013-07-07 ENCOUNTER — Encounter (HOSPITAL_COMMUNITY): Payer: Self-pay | Admitting: Emergency Medicine

## 2013-07-07 ENCOUNTER — Emergency Department (HOSPITAL_COMMUNITY): Payer: Medicare Other

## 2013-07-07 ENCOUNTER — Emergency Department (HOSPITAL_COMMUNITY)
Admission: EM | Admit: 2013-07-07 | Discharge: 2013-07-08 | Disposition: A | Discharge status: Home / Self Care | Payer: Medicare Other | Attending: Emergency Medicine | Admitting: Emergency Medicine

## 2013-07-07 DIAGNOSIS — Z9889 Other specified postprocedural states: Secondary | ICD-10-CM | POA: Insufficient documentation

## 2013-07-07 DIAGNOSIS — R079 Chest pain, unspecified: Secondary | ICD-10-CM

## 2013-07-07 DIAGNOSIS — J4489 Other specified chronic obstructive pulmonary disease: Secondary | ICD-10-CM | POA: Insufficient documentation

## 2013-07-07 DIAGNOSIS — Z87891 Personal history of nicotine dependence: Secondary | ICD-10-CM | POA: Insufficient documentation

## 2013-07-07 DIAGNOSIS — J449 Chronic obstructive pulmonary disease, unspecified: Secondary | ICD-10-CM | POA: Insufficient documentation

## 2013-07-07 DIAGNOSIS — I10 Essential (primary) hypertension: Secondary | ICD-10-CM | POA: Insufficient documentation

## 2013-07-07 DIAGNOSIS — Z8719 Personal history of other diseases of the digestive system: Secondary | ICD-10-CM | POA: Insufficient documentation

## 2013-07-07 DIAGNOSIS — E785 Hyperlipidemia, unspecified: Secondary | ICD-10-CM | POA: Insufficient documentation

## 2013-07-07 DIAGNOSIS — Z79899 Other long term (current) drug therapy: Secondary | ICD-10-CM | POA: Insufficient documentation

## 2013-07-07 DIAGNOSIS — R0789 Other chest pain: Secondary | ICD-10-CM | POA: Insufficient documentation

## 2013-07-07 LAB — BASIC METABOLIC PANEL
BUN: 12 mg/dL (ref 6–23)
CHLORIDE: 100 meq/L (ref 96–112)
CO2: 27 mEq/L (ref 19–32)
Calcium: 9.5 mg/dL (ref 8.4–10.5)
Creatinine, Ser: 1.08 mg/dL (ref 0.50–1.35)
GFR calc Af Amer: 74 mL/min — ABNORMAL LOW (ref >90)
GFR calc non Af Amer: 64 mL/min — ABNORMAL LOW (ref >90)
GLUCOSE: 110 mg/dL — AB (ref 70–99)
POTASSIUM: 4 meq/L (ref 3.7–5.3)
Sodium: 139 mEq/L (ref 137–147)

## 2013-07-07 LAB — CBC
HEMATOCRIT: 36.3 % — AB (ref 39.0–52.0)
Hemoglobin: 12.3 g/dL — ABNORMAL LOW (ref 13.0–17.0)
MCH: 30.7 pg (ref 26.0–34.0)
MCHC: 33.9 g/dL (ref 30.0–36.0)
MCV: 90.5 fL (ref 78.0–100.0)
Platelets: 298 10*3/uL (ref 150–400)
RBC: 4.01 MIL/uL — ABNORMAL LOW (ref 4.22–5.81)
RDW: 12.5 % (ref 11.5–15.5)
WBC: 5.3 10*3/uL (ref 4.0–10.5)

## 2013-07-07 LAB — I-STAT TROPONIN, ED: Troponin i, poc: 0.01 ng/mL (ref 0.00–0.08)

## 2013-07-07 MED ORDER — ASPIRIN 81 MG PO CHEW
324.0000 mg | CHEWABLE_TABLET | Freq: Once | ORAL | Status: AC
  Administered 2013-07-08: 324 mg via ORAL
  Filled 2013-07-07: qty 4

## 2013-07-07 NOTE — ED Notes (Signed)
Reports left sided cp at 1800. Denies SOB, n/v/ or diaphoresis. Reports chest tightness at current 5/10; denies cardiac hx. Pt is a x 4.
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
## 2013-07-08 ENCOUNTER — Telehealth: Payer: Self-pay | Admitting: Pulmonary Disease

## 2013-07-08 LAB — I-STAT TROPONIN, ED: TROPONIN I, POC: 0.04 ng/mL (ref 0.00–0.08)

## 2013-07-08 MED ORDER — IPRATROPIUM BROMIDE 0.02 % IN SOLN: 0.5000 mg | Freq: Two times a day (BID) | RESPIRATORY_TRACT | Status: DC

## 2013-07-08 NOTE — Telephone Encounter (Signed)
Patient Instructions      STOP spiriva Start atrovent nebs twice daily INSTEAD (can mix with albuterol) We discussed study for COPD medications & will have research nurse contact you  --   Spoke with spouse. She reports they have not heard from the resaerch nurse. Spoke with jeanette and she reports they should be getting in contact with pt by the end of the month Call spouse back and made her aware. Nothing further needed

## 2013-07-08 NOTE — Discharge Instructions (Signed)
Please follow up with Dr. Tresa EndoKelly this week.  Return to the ER immediately if you have worsening chest pain or develop shortness of breath.

## 2013-07-09 NOTE — ED Provider Notes (Signed)
Medical screening examination/treatment/procedure(s) were performed by non-physician practitioner and as supervising physician I was immediately available for consultation/collaboration.   EKG Interpretation   Date/Time:  Monday July 07 2013 19:05:50 EDT Ventricular Rate:  76 PR Interval:  142 QRS Duration: 94 QT Interval:  352 QTC Calculation: 396 R Axis:   79 Text Interpretation:  Normal sinus rhythm T wave abnormality, consider  lateral ischemia Abnormal ECG No significant change since last tracing  Confirmed by HARRISON  MD, FORREST ([id number]) on 07/07/2013 10:18:44 PM       Geoffery Lyonsouglas Lorrene Graef, MD 07/09/13 [phone]

## 2013-07-16 ENCOUNTER — Telehealth: Payer: Self-pay | Admitting: Pulmonary Disease

## 2013-07-16 NOTE — Telephone Encounter (Signed)
Called and spoke with spouse. I went over last OV instructions and advised her pt was to stop the spiriva and pt is to be on nebs instead. She voiced her understanding and needed nothing further

## 2013-07-16 NOTE — Telephone Encounter (Signed)
Atrovent 0.02% neb solution rx was sent to [facility] [address] on 07/08/13 per pt's chart. I called [facility], spoke with Shanotte.  Was advised they did receive the atrovent neb rx, but it was put back bc it wasn't picked up in time.  Per HobartShanotte, She has ran this through [employer]'s insurance and will be covered.  Shanotte states they will get the rx ready for pt and will have ready by 1 pm.  Called, spoke with pt's wife.  Explained above to her.  She verbalized understanding and was appreciative of our help.  Wife is to call back if she does still have problems trying to pick medication up.

## 2013-08-25 ENCOUNTER — Telehealth: Payer: Self-pay | Admitting: Pulmonary Disease

## 2013-08-25 NOTE — Telephone Encounter (Signed)
I called spoke with pt. He c/o blurred vision off and on during the day, not able to see as good as he used to. He wants to know if his inhaler symbicort or nebs would have anything to do with this. Please advise RA thanks  No Known Allergies

## 2013-08-25 NOTE — Telephone Encounter (Signed)
Unlikely - if persists needs to see eye doctor

## 2013-08-25 NOTE — Telephone Encounter (Signed)
I called and made spouse aware. Nothing further needed

## 2013-09-22 ENCOUNTER — Encounter: Payer: Self-pay | Admitting: Pulmonary Disease

## 2013-09-22 ENCOUNTER — Ambulatory Visit (INDEPENDENT_AMBULATORY_CARE_PROVIDER_SITE_OTHER): Payer: Medicare Other | Admitting: Pulmonary Disease

## 2013-09-22 VITALS — BP 164/84 | HR 86 | Temp 97.1°F | Ht 65.0 in | Wt 181.2 lb

## 2013-09-22 DIAGNOSIS — J449 Chronic obstructive pulmonary disease, unspecified: Secondary | ICD-10-CM

## 2013-09-22 NOTE — Patient Instructions (Signed)
Stay on symbicort 2 puffs twice daily Use albuterol nebs thrice daily as needed for wheezing or shortness of breath

## 2013-09-29 NOTE — Progress Notes (Signed)
   Subjective:    Patient ID: Alexander [name], male    DOB: [birth year], 80 y.o.   MRN: [id number]  HPI   80/M, ex smoker for FU of gold C COPD & dyspnea  01/09/13  Cardiac evaluation - echo nml  Stress test - peak VO2 68%, limited by ventilation  c. cath [name]) -11/10 - nml LV fn, nml coronaries  Spirometry >>FEV1 1.14 - 45% -moderate airway obstruction   04/11/13 Acute OV >>doxycycline , tudorza rx  Placed on [place] (insurance would not cover [place])  He does feel that his shortness, of breath is some better. [name] helps him with his medications. He is confused with his inhalers.  He developed sores in his mouth since beginning the spiriva>> hence stopped , put ona trovent     Chief Complaint  Patient presents with  . Follow-up    Pt states his breathing is unchaged. Pt c/o DOE, mild cough with clear mucous. C/o intermittent chest tightness.    Some confusion around his nebs Does not seem to be taking atrovent Back on symbicort, uses albuterol 2-3 /day  Review of Systems neg for any significant sore throat, dysphagia, itching, sneezing, nasal congestion or excess/ purulent secretions, fever, chills, sweats, unintended wt loss, pleuritic or exertional cp, hempoptysis, orthopnea pnd or change in chronic leg swelling. Also denies presyncope, palpitations, heartburn, abdominal pain, nausea, vomiting, diarrhea or change in bowel or urinary habits, dysuria,hematuria, rash, arthralgias, visual complaints, headache, numbness weakness or ataxia.     Objective:   Physical Exam  Gen. Pleasant, well-nourished, in no distress ENT - no lesions, no post nasal drip Neck: No JVD, no thyromegaly, no carotid bruits Lungs: no use of accessory muscles, no dullness to percussion, clear without rales or rhonchi  Cardiovascular: Rhythm regular, heart sounds  normal, no murmurs or gallops, no peripheral edema Musculoskeletal: No deformities, no cyanosis or clubbing          Assessment & Plan:

## 2013-09-29 NOTE — Assessment & Plan Note (Signed)
Stay on symbicort 2 puffs twice daily Use albuterol nebs thrice daily as needed for wheezing or shortness of breath If worse in the future, will add atrovent nebs to this regimen

## 2013-10-13 ENCOUNTER — Other Ambulatory Visit: Payer: Self-pay

## 2013-10-13 MED ORDER — LOSARTAN POTASSIUM 100 MG PO TABS: 100.0000 mg | ORAL_TABLET | Freq: Every day | ORAL | Status: DC

## 2013-10-13 NOTE — Telephone Encounter (Signed)
Rx was sent to pharmacy electronically. 

## 2013-11-17 ENCOUNTER — Other Ambulatory Visit (INDEPENDENT_AMBULATORY_CARE_PROVIDER_SITE_OTHER): Payer: Medicare Other

## 2013-11-17 ENCOUNTER — Encounter: Payer: Self-pay | Admitting: Internal Medicine

## 2013-11-17 ENCOUNTER — Ambulatory Visit (INDEPENDENT_AMBULATORY_CARE_PROVIDER_SITE_OTHER): Payer: Medicare Other | Admitting: Internal Medicine

## 2013-11-17 VITALS — BP 112/80 | HR 73 | Temp 98.4°F | Ht 65.0 in | Wt 176.0 lb

## 2013-11-17 DIAGNOSIS — I10 Essential (primary) hypertension: Secondary | ICD-10-CM

## 2013-11-17 DIAGNOSIS — J449 Chronic obstructive pulmonary disease, unspecified: Secondary | ICD-10-CM

## 2013-11-17 DIAGNOSIS — Z0000 Encounter for general adult medical examination without abnormal findings: Secondary | ICD-10-CM

## 2013-11-17 DIAGNOSIS — E785 Hyperlipidemia, unspecified: Secondary | ICD-10-CM

## 2013-11-17 LAB — URINALYSIS, ROUTINE W REFLEX MICROSCOPIC
Bilirubin Urine: NEGATIVE
Hgb urine dipstick: NEGATIVE
Ketones, ur: NEGATIVE
Leukocytes, UA: NEGATIVE
NITRITE: NEGATIVE
RBC / HPF: NONE SEEN (ref 0–?)
SPECIFIC GRAVITY, URINE: 1.015 (ref 1.000–1.030)
TOTAL PROTEIN, URINE-UPE24: NEGATIVE
URINE GLUCOSE: NEGATIVE
Urobilinogen, UA: 0.2 (ref 0.0–1.0)
WBC UA: NONE SEEN (ref 0–?)
pH: 5 (ref 5.0–8.0)

## 2013-11-17 LAB — LIPID PANEL
CHOL/HDL RATIO: 3
Cholesterol: 127 mg/dL (ref 0–200)
HDL: 47.8 mg/dL (ref >39.00)
LDL Cholesterol: 65 mg/dL (ref 0–99)
NonHDL: 79.2
TRIGLYCERIDES: 71 mg/dL (ref 0.0–149.0)
VLDL: 14.2 mg/dL (ref 0.0–40.0)

## 2013-11-17 LAB — BASIC METABOLIC PANEL
BUN: 17 mg/dL (ref 6–23)
CO2: 27 meq/L (ref 19–32)
Calcium: 9.4 mg/dL (ref 8.4–10.5)
Chloride: 105 mEq/L (ref 96–112)
Creatinine, Ser: 1.1 mg/dL (ref 0.4–1.5)
GFR: 82.25 mL/min (ref >60.00)
Glucose, Bld: 106 mg/dL — ABNORMAL HIGH (ref 70–99)
POTASSIUM: 4.4 meq/L (ref 3.5–5.1)
SODIUM: 139 meq/L (ref 135–145)

## 2013-11-17 LAB — HEPATIC FUNCTION PANEL
ALBUMIN: 4.1 g/dL (ref 3.5–5.2)
ALT: 17 U/L (ref 0–53)
AST: 23 U/L (ref 0–37)
Alkaline Phosphatase: 63 U/L (ref 39–117)
BILIRUBIN DIRECT: 0.1 mg/dL (ref 0.0–0.3)
TOTAL PROTEIN: 7.1 g/dL (ref 6.0–8.3)
Total Bilirubin: 0.5 mg/dL (ref 0.2–1.2)

## 2013-11-17 LAB — CBC WITH DIFFERENTIAL/PLATELET
BASOS PCT: 0.4 % (ref 0.0–3.0)
Basophils Absolute: 0 10*3/uL (ref 0.0–0.1)
EOS ABS: 0.6 10*3/uL (ref 0.0–0.7)
EOS PCT: 10.3 % — AB (ref 0.0–5.0)
HEMATOCRIT: 35.3 % — AB (ref 39.0–52.0)
HEMOGLOBIN: 11.8 g/dL — AB (ref 13.0–17.0)
LYMPHS ABS: 1.4 10*3/uL (ref 0.7–4.0)
Lymphocytes Relative: 26.4 % (ref 12.0–46.0)
MCHC: 33.5 g/dL (ref 30.0–36.0)
MCV: 91.5 fl (ref 78.0–100.0)
MONO ABS: 0.5 10*3/uL (ref 0.1–1.0)
Monocytes Relative: 9.9 % (ref 3.0–12.0)
NEUTROS ABS: 2.9 10*3/uL (ref 1.4–7.7)
NEUTROS PCT: 53 % (ref 43.0–77.0)
Platelets: 309 10*3/uL (ref 150.0–400.0)
RBC: 3.85 Mil/uL — ABNORMAL LOW (ref 4.22–5.81)
RDW: 13.4 % (ref 11.5–15.5)
WBC: 5.4 10*3/uL (ref 4.0–10.5)

## 2013-11-17 LAB — TSH: TSH: 1.73 u[IU]/mL (ref 0.35–4.50)

## 2013-11-17 MED ORDER — PANTOPRAZOLE SODIUM 20 MG PO TBEC: 20.0000 mg | DELAYED_RELEASE_TABLET | Freq: Every day | ORAL | Status: DC | PRN

## 2013-11-17 MED ORDER — PREDNISONE (PAK) 10 MG PO TABS: ORAL_TABLET | ORAL | Status: DC

## 2013-11-17 NOTE — Assessment & Plan Note (Addendum)
Gold C symptoms - daily dyspnea, follows close with pulm for same Restart trial Spiriva 04/2013 - ?side effects such as angioedema and penile pain - Advised unlikely related but to monitor and contact pulm for concerns or recurrence Continue Symbicort with albuterol MDI/nebulizer as needed Mild flare in past 1 week - wheeze on exam - pred taper erx done today

## 2013-11-17 NOTE — Assessment & Plan Note (Signed)
On Crestor therapy as advised by cardiology Tolerating well Last lipids at goal check annually and titrate as needed

## 2013-11-17 NOTE — Assessment & Plan Note (Signed)
BP Readings from Last 3 Encounters:  11/17/13 112/80  09/22/13 164/84  07/08/13 109/70   The current medical regimen is effective;  continue present plan and medications.

## 2013-11-17 NOTE — Progress Notes (Signed)
Pre visit review using our clinic review tool, if applicable. No additional management support is needed unless otherwise documented below in the visit note. 
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
## 2013-11-17 NOTE — Patient Instructions (Addendum)
It was good to see you today.  We have reviewed your prior records including labs and tests today  Health Maintenance reviewed - check with your insurance about the shingles vaccine and let us know if you want to proceed - all other recommended immunizations and age-appropriate screenings are up-to-date.  Test(s) ordered today. Your results will be released to MyChart (or called to you) after review, usually within 72hours after test completion. If any changes need to be made, you will be notified at that same time.  Medications reviewed and updated, prednisone taper x 6 days for COPD flare - no other changes recommended at this time. Your prescription(s) have been submitted to your pharmacy. Please take as directed and contact our office if you believe you are having problem(s) with the medication(s).  Continue working with your other specialists as reviewed today  Please schedule followup in 12 months for annual exam and labs, call sooner if problems.  Health Maintenance A healthy lifestyle and preventative care can promote health and wellness.  Maintain regular health, dental, and eye exams.  Eat a healthy diet. Foods like vegetables, fruits, whole grains, low-fat dairy products, and lean protein foods contain the nutrients you need and are low in calories. Decrease your intake of foods high in solid fats, added sugars, and salt. Get information about a proper diet from your health care provider, if necessary.  Regular physical exercise is one of the most important things you can do for your health. Most adults should get at least 150 minutes of moderate-intensity exercise (any activity that increases your heart rate and causes you to sweat) each week. In addition, most adults need muscle-strengthening exercises on 2 or more days a week.   Maintain a healthy weight. The body mass index (BMI) is a screening tool to identify possible weight problems. It provides an estimate of body fat  based on height and weight. Your health care provider can find your BMI and can help you achieve or maintain a healthy weight. For males 20 years and older:  A BMI below 18.5 is considered underweight.  A BMI of 18.5 to 24.9 is normal.  A BMI of 25 to 29.9 is considered overweight.  A BMI of 30 and above is considered obese.  Maintain normal blood lipids and cholesterol by exercising and minimizing your intake of saturated fat. Eat a balanced diet with plenty of fruits and vegetables. Blood tests for lipids and cholesterol should begin at age 78 and be repeated every 5 years. If your lipid or cholesterol levels are high, you are over age 78, or you are at high risk for heart disease, you may need your cholesterol levels checked more frequently.Ongoing high lipid and cholesterol levels should be treated with medicines if diet and exercise are not working.  If you smoke, find out from your health care provider how to quit. If you do not use tobacco, do not start.  Lung cancer screening is recommended for adults aged 55-80 years who are at high risk for developing lung cancer because of a history of smoking. A yearly low-dose CT scan of the lungs is recommended for people who have at least a 30-pack-year history of smoking and are current smokers or have quit within the past 15 years. A pack year of smoking is smoking an average of 1 pack of cigarettes a day for 1 year (for example, a 30-pack-year history of smoking could mean smoking 1 pack a day for 30 years or 2 packs  a day for 15 years). Yearly screening should continue until the smoker has stopped smoking for at least 15 years. Yearly screening should be stopped for people who develop a health problem that would prevent them from having lung cancer treatment.  If you choose to drink alcohol, do not have more than 2 drinks per day. One drink is considered to be 12 oz (360 mL) of beer, 5 oz (150 mL) of wine, or 1.5 oz (45 mL) of liquor.  Avoid the  use of street drugs. Do not share needles with anyone. Ask for help if you need support or instructions about stopping the use of drugs.  High blood pressure causes heart disease and increases the risk of stroke. Blood pressure should be checked at least every 1-2 years. Ongoing high blood pressure should be treated with medicines if weight loss and exercise are not effective.  If you are 46-80 years old, ask your health care provider if you should take aspirin to prevent heart disease.  Diabetes screening involves taking a blood sample to check your fasting blood sugar level. This should be done once every 3 years after age 36 if you are at a normal weight and without risk factors for diabetes. Testing should be considered at a younger age or be carried out more frequently if you are overweight and have at least 1 risk factor for diabetes.  Colorectal cancer can be detected and often prevented. Most routine colorectal cancer screening begins at the age of 52 and continues through age 16. However, your health care provider may recommend screening at an earlier age if you have risk factors for colon cancer. On a yearly basis, your health care provider may provide home test kits to check for hidden blood in the stool. A small camera at the end of a tube may be used to directly examine the colon (sigmoidoscopy or colonoscopy) to detect the earliest forms of colorectal cancer. Talk to your health care provider about this at age 48 when routine screening begins. A direct exam of the colon should be repeated every 5-10 years through age 33, unless early forms of precancerous polyps or small growths are found.  People who are at an increased risk for hepatitis B should be screened for this virus. You are considered at high risk for hepatitis B if:  You were born in a country where hepatitis B occurs often. Talk with your health care provider about which countries are considered high risk.  Your parents were  born in a high-risk country and you have not received a shot to protect against hepatitis B (hepatitis B vaccine).  You have HIV or AIDS.  You use needles to inject street drugs.  You live with, or have sex with, someone who has hepatitis B.  You are a man who has sex with other men (MSM).  You get hemodialysis treatment.  You take certain medicines for conditions like cancer, organ transplantation, and autoimmune conditions.  Hepatitis C blood testing is recommended for all people born from [birth year] through 1963 and any individual with known risk factors for hepatitis C.  Healthy men should no longer receive prostate-specific antigen (PSA) blood tests as part of routine cancer screening. Talk to your health care provider about prostate cancer screening.  Testicular cancer screening is not recommended for adolescents or adult males who have no symptoms. Screening includes self-exam, a health care provider exam, and other screening tests. Consult with your health care provider about any symptoms you have  or any concerns you have about testicular cancer.  Practice safe sex. Use condoms and avoid high-risk sexual practices to reduce the spread of sexually transmitted infections (STIs).  You should be screened for STIs, including gonorrhea and chlamydia if:  You are sexually active and are younger than 24 years.  You are older than 24 years, and your health care provider tells you that you are at risk for this type of infection.  Your sexual activity has changed since you were last screened, and you are at an increased risk for chlamydia or gonorrhea. Ask your health care provider if you are at risk.  If you are at risk of being infected with HIV, it is recommended that you take a prescription medicine daily to prevent HIV infection. This is called pre-exposure prophylaxis (PrEP). You are considered at risk if:  You are a man who has sex with other men (MSM).  You are a heterosexual man who  is sexually active with multiple partners.  You take drugs by injection.  You are sexually active with a partner who has HIV.  Talk with your health care provider about whether you are at high risk of being infected with HIV. If you choose to begin PrEP, you should first be tested for HIV. You should then be tested every 3 months for as long as you are taking PrEP.  Use sunscreen. Apply sunscreen liberally and repeatedly throughout the day. You should seek shade when your shadow is shorter than you. Protect yourself by wearing long sleeves, pants, a wide-brimmed hat, and sunglasses year round whenever you are outdoors.  Tell your health care provider of new moles or changes in moles, especially if there is a change in shape or color. Also, tell your health care provider if a mole is larger than the size of a pencil eraser.  A one-time screening for abdominal aortic aneurysm (AAA) and surgical repair of large AAAs by ultrasound is recommended for men aged 65-75 years who are current or former smokers.  Stay current with your vaccines (immunizations). Document Released: 10/07/2007 Document Revised: 04/15/2013 Document Reviewed: 09/05/2010 [facility] Patient Information 2015 [place], [place]. This information is not intended to replace advice given to you by your health care provider. Make sure you discuss any questions you have with your health care provider.

## 2013-11-28 ENCOUNTER — Other Ambulatory Visit: Payer: Self-pay | Admitting: Pulmonary Disease

## 2013-11-28 MED ORDER — PREDNISONE 10 MG PO TABS: ORAL_TABLET | ORAL | Status: DC

## 2013-12-31 ENCOUNTER — Encounter: Payer: Self-pay | Admitting: Internal Medicine

## 2013-12-31 ENCOUNTER — Ambulatory Visit (INDEPENDENT_AMBULATORY_CARE_PROVIDER_SITE_OTHER): Payer: Medicare Other | Admitting: Internal Medicine

## 2013-12-31 VITALS — BP 112/70 | HR 77 | Temp 98.4°F | Wt 176.5 lb

## 2013-12-31 DIAGNOSIS — R198 Other specified symptoms and signs involving the digestive system and abdomen: Secondary | ICD-10-CM

## 2013-12-31 DIAGNOSIS — R194 Change in bowel habit: Secondary | ICD-10-CM

## 2013-12-31 NOTE — Patient Instructions (Signed)
The GI  Referral to Dr Kinnie Scales will be scheduled and you'll be notified of the time.

## 2013-12-31 NOTE — Progress Notes (Signed)
Pre visit review using our clinic review tool, if applicable. No additional management support is needed unless otherwise documented below in the visit note. 

## 2013-12-31 NOTE — Progress Notes (Signed)
   Subjective:    Patient ID: Alexander [name], male    DOB: 1932/05/26, 80 y.o.   MRN: [id number]  HPI   He is here requesting a referral to Dr. Kinnie Scales for bowel dysfunction.  2-3 months ago he had banding of his hemorrhoids by Dr. Elnoria Howard. Since that time he's had difficulty with having a satisfactory BM. He's been placed on Linzess. which has resulted in watery bowel movements. Sometimes he has some incontinence. Previously Miralax ineffective.  He states with the Linzess medicine he must drink "a gallon of water a day".  He no longer has the pain and bleeding that was present prior to banding the hemorrhoids but he feels the bowel changes affect his quality of life.  Review of Systems  He denies any bleeding dyscrasias. He does not have significant dyspepsia or dysphagia. He also has no melena.  He does wear complete dentures; he states that he is not having trouble chewing his food.  His TSH was therapeutic at 1.73 last month.       Objective:   Physical Exam   Positive or pertinent findings include: He has bilateral ptosis. Has no scleral icterus. He has complete dentures. The lower is ill fitting. Abdomen is doughy; his belly is nontender. No organomegaly or masses are present  General appearance :adequately nourished; in no distress. Eyes: No conjunctival inflammation  Oral exam:  Lips and gums are healthy appearing.There is no oropharyngeal erythema or exudate noted.  Heart:  Normal rate and regular rhythm. S1 and S2 normal without gallop, murmur, click, rub or other extra sounds   Lungs:Chest clear to auscultation; no wheezes, rhonchi,rales ,or rubs present.No increased work of breathing.  Abdomen: bowel sounds normal.  No guarding or rebound. No flank tenderness to percussion. Skin:Warm & dry.  Intact without suspicious lesions or rashes ; no jaundice or tenting Lymphatic: No lymphadenopathy is noted about the head, neck, axilla          Assessment & Plan:    #1 bowel changes as constipation unless he takes Linzess. which results in watery stool.  Plan: As per Dr. Kinnie Scales.

## 2014-01-06 ENCOUNTER — Ambulatory Visit (INDEPENDENT_AMBULATORY_CARE_PROVIDER_SITE_OTHER): Payer: Medicare Other | Admitting: Internal Medicine

## 2014-01-06 ENCOUNTER — Encounter: Payer: Self-pay | Admitting: Internal Medicine

## 2014-01-06 VITALS — BP 104/70 | HR 61 | Temp 97.9°F | Wt 173.5 lb

## 2014-01-06 DIAGNOSIS — K5900 Constipation, unspecified: Secondary | ICD-10-CM

## 2014-01-06 DIAGNOSIS — R6881 Early satiety: Secondary | ICD-10-CM

## 2014-01-06 NOTE — Progress Notes (Signed)
   Subjective:    Patient ID: Alexander [name], male    DOB: 1933/01/15, 80 y.o.   MRN: [id number]  HPI   The GI referral to Dr. Kinnie Scales was entered 12/31/13. It is unclear why this is not completed as of this date; it appears to involve some protocol through Dr. Jennye Boroughs office.  He has had banding of hemorrhoids by Dr. Elnoria Howard; he feels this has caused narrowing of the rectal opening. He must take Linzess and drink up to a gallon of water a day to make the stool liquid enough to pass.  He has some chilling without associated fever or sweats.  His wife states he has early satiety.  Review of Systems    He denies weight loss, abdominal pain, melena, rectal bleeding.  His most recent TSH was 1.73 on 11/05/13.     Objective:   Physical Exam   He is in no acute distress.  The rectum is not strictured but he has marked pain with digital rectal exam. Clinically there appears to be some hemorrhoidal tissue internally at 6:00.        Assessment & Plan:  #1 constipation requiring liquification of stool for passage due to what he feels is a rectal stricture. This is not documented clinically. #2 early satiety  Plan: As per Dr. Kinnie Scales.

## 2014-01-06 NOTE — Progress Notes (Signed)
Pre visit review using our clinic review tool, if applicable. No additional management support is needed unless otherwise documented below in the visit note. 

## 2014-01-06 NOTE — Patient Instructions (Signed)
The GI referral to Dr Kinnie Scales will be scheduled and you'll be notified of the time.

## 2014-01-27 ENCOUNTER — Other Ambulatory Visit (INDEPENDENT_AMBULATORY_CARE_PROVIDER_SITE_OTHER): Payer: Medicare Other

## 2014-01-27 ENCOUNTER — Ambulatory Visit (INDEPENDENT_AMBULATORY_CARE_PROVIDER_SITE_OTHER): Payer: Medicare Other | Admitting: Internal Medicine

## 2014-01-27 ENCOUNTER — Encounter: Payer: Self-pay | Admitting: Internal Medicine

## 2014-01-27 ENCOUNTER — Telehealth: Payer: Self-pay | Admitting: Internal Medicine

## 2014-01-27 VITALS — BP 132/78 | HR 72 | Temp 98.0°F | Ht 65.0 in | Wt 177.8 lb

## 2014-01-27 DIAGNOSIS — Z0000 Encounter for general adult medical examination without abnormal findings: Secondary | ICD-10-CM

## 2014-01-27 DIAGNOSIS — H9193 Unspecified hearing loss, bilateral: Secondary | ICD-10-CM

## 2014-01-27 DIAGNOSIS — R413 Other amnesia: Secondary | ICD-10-CM

## 2014-01-27 DIAGNOSIS — H409 Unspecified glaucoma: Secondary | ICD-10-CM | POA: Insufficient documentation

## 2014-01-27 DIAGNOSIS — I10 Essential (primary) hypertension: Secondary | ICD-10-CM

## 2014-01-27 DIAGNOSIS — K5909 Other constipation: Secondary | ICD-10-CM | POA: Insufficient documentation

## 2014-01-27 DIAGNOSIS — Z23 Encounter for immunization: Secondary | ICD-10-CM

## 2014-01-27 DIAGNOSIS — K5900 Constipation, unspecified: Secondary | ICD-10-CM

## 2014-01-27 LAB — VITAMIN B12: Vitamin B-12: 557 pg/mL (ref 211–911)

## 2014-01-27 LAB — RPR

## 2014-01-27 MED ORDER — ALBUTEROL SULFATE HFA 108 (90 BASE) MCG/ACT IN AERS: 2.0000 | INHALATION_SPRAY | RESPIRATORY_TRACT | Status: DC | PRN

## 2014-01-27 NOTE — Assessment & Plan Note (Signed)
BP Readings from Last 3 Encounters:  01/27/14 132/78  01/06/14 104/70  12/31/13 112/70   The current medical regimen is effective;  continue present plan and medications.

## 2014-01-27 NOTE — Patient Instructions (Addendum)
It was good to see you today.  We have reviewed your prior records including labs and tests today  Your annual flu shot was given and/or updated today.  Health Maintenance reviewed - all other recommended immunizations and age-appropriate screenings are up-to-date.  Test(s) ordered today. Your results will be released to MyChart (or called to you) after review, usually within 72hours after test completion. If any changes need to be made, you will be notified at that same time.  Medications reviewed and updated, no changes recommended at this time.  we'll make referral to Hearing clinic for evaluation of hearing aides. Also for MRI of brain to evaluate memory ( vascular dementia). Our office will contact you regarding appointment(s) once made.  Please schedule followup in 6 months for semiannual exam and labs, call sooner if problems.  Advance Directive Advance directives are the legal documents that allow you to make choices about your health care and medical treatment if you cannot speak for yourself. Advance directives are a way for you to communicate your wishes to family, friends, and health care providers. The specified people can then convey your decisions about end-of-life care to avoid confusion if you should become unable to communicate. Ideally, the process of discussing and writing advance directives should happen over time rather than making decisions all at once. Advance directives can be modified as your situation changes, and you can change your mind at any time, even after you have signed the advance directives. Each state has its own laws regarding advance directives. You may want to check with your health care provider, attorney, or state representative about the law in your state. Below are some examples of advance directives. LIVING WILL A living will is a set of instructions documenting your wishes about medical care when you cannot care for yourself. It is used if you  become:  Terminally ill.  Incapacitated.  Unable to communicate.  Unable to make decisions. Items to consider in your living will include:  The use or non-use of life-sustaining equipment, such as dialysis machines and breathing machines (ventilators).  A do not resuscitate (DNR) order, which is the instruction not to use cardiopulmonary resuscitation (CPR) if breathing or heartbeat stops.  Tube feeding.  Withholding of food and fluids.  Comfort (palliative) care when the goal becomes comfort rather than a cure.  Organ and tissue donation. A living will does not give instructions about distribution of your money and property if you should pass away. It is advisable to seek the expert advice of a lawyer in drawing up a will regarding your possessions. Decisions about taxes, beneficiaries, and asset distribution will be legally binding. This process can relieve your family and friends of any burdens surrounding disputes or questions that may come up about the allocation of your assets. DO NOT RESUSCITATE (DNR) A do not resuscitate (DNR) order is a request to not have CPR in the event that your heart stops beating or you stop breathing. Unless given other instructions, a health care provider will try to help any patient whose heart has stopped or who has stopped breathing.  HEALTH CARE PROXY AND DURABLE POWER OF ATTORNEY FOR HEALTH CARE A health care proxy is a person (agent) appointed to make medical decisions for you if you cannot. Generally, people choose someone they know well and trust to represent their preferences when they can no longer do so. You should be sure to ask this person for agreement to act as your agent. An agent may have  to exercise judgment in the event of a medical decision for which your wishes are not known. The durable power of attorney for health care is the legal document that names your health care proxy. Once written, it should  be:  Signed.  Notarized.  Dated.  Copied.  Witnessed.  Incorporated into your medical record. You may also want to appoint someone to manage your financial affairs if you cannot. This is called a durable power of attorney for finances. It is a separate legal document from the durable power of attorney for health care. You may choose the same person or someone different from your health care proxy to act as your agent in financial matters. Document Released: 07/18/2007 Document Revised: 04/15/2013 Document Reviewed: 08/28/2012 [facility] Patient Information 2015 [place], [place]. This information is not intended to replace advice given to you by your health care provider. Make sure you discuss any questions you have with your health care provider.

## 2014-01-27 NOTE — Assessment & Plan Note (Signed)
Linzess and stool softer appt with GI for colo 10/23 (staying with [facility] instead of 2nd opinion [facility] because of office policy on Dr [facility])
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
## 2014-01-27 NOTE — Telephone Encounter (Signed)
States Dr. Felicity CoyerLeschber was to give him an inhaler before he left today.  He is requesting it to be held up front to come get.

## 2014-01-27 NOTE — Progress Notes (Signed)
Pre visit review using our clinic review tool, if applicable. No additional management support is needed unless otherwise documented below in the visit note. 

## 2014-01-28 NOTE — Telephone Encounter (Signed)
LVM for pt informing that inhaler is at the pharmacy.

## 2014-01-28 NOTE — Telephone Encounter (Signed)
No samples available - i sent the refill on his Albuterol MDI (ProAir) to his pharmacy on 10/5 at [facility] -  Sorry for his misunderstanding  thanks

## 2014-01-29 ENCOUNTER — Telehealth: Payer: Self-pay | Admitting: Internal Medicine

## 2014-01-29 NOTE — Telephone Encounter (Signed)
Patients wife would like a call to patient in regards to results of recent labs and also states patient was to receive an albuterol pump.  Patient wife would like to pick up.

## 2014-01-29 NOTE — Telephone Encounter (Signed)
Pt wife informed of test results.

## 2014-02-03 ENCOUNTER — Ambulatory Visit (INDEPENDENT_AMBULATORY_CARE_PROVIDER_SITE_OTHER): Payer: Medicare Other | Admitting: Internal Medicine

## 2014-02-03 ENCOUNTER — Encounter: Payer: Self-pay | Admitting: Internal Medicine

## 2014-02-03 ENCOUNTER — Telehealth: Payer: Self-pay | Admitting: Pulmonary Disease

## 2014-02-03 VITALS — BP 104/62 | HR 73 | Temp 97.5°F | Ht 67.0 in | Wt 177.0 lb

## 2014-02-03 DIAGNOSIS — J449 Chronic obstructive pulmonary disease, unspecified: Secondary | ICD-10-CM

## 2014-02-03 MED ORDER — PREDNISONE 10 MG PO TABS: ORAL_TABLET | ORAL | Status: DC

## 2014-02-03 NOTE — Progress Notes (Signed)
Subjective:    Patient ID: Alexander MorelHarry L Oppedisano, male    DOB: [birth year], 80 y.o.   MRN: [id number]  HPI   80/M, ex smoker for FU of gold C COPD & dyspnea  01/09/13  Cardiac evaluation - echo nml  Stress test - peak VO2 68%, limited by ventilation  c. cath [name] -11/10 - nml LV fn, nml coronaries  Spirometry >>FEV1 1.14 - 45% -moderate airway obstruction   04/11/13 Acute OV >>doxycycline , tudorza rx  [place]  He does feel that his shortness, of breath is some better. [name] helps him with his medications. He is confused with his inhalers.  He developed sores in his mouth since beginning the spiriva>> hence stopped , put ona trovent    09/22/13 Chief Complaint  Patient presents with  . Follow-up    Pt states his breathing is unchaged. Pt c/o DOE, mild cough with clear mucous. C/o intermittent chest tightness.    Some confusion around his nebs Does not seem to be taking atrovent Back on symbicort, uses albuterol 2-3 /day rec Stay on symbicort 2 puffs twice daily Use albuterol nebs thrice daily as needed for wheezing or shortness of breath   02/03/2014 f/u ov/Porshia Blizzard re:  GOLD III COPD/ very poor hfa  Chief Complaint  Patient presents with  . Acute Visit    Pt c/o wheezing, increased SOB and cough for the past 10-14 days. Cough is prod with moderate clear to grey sputum.      confused with meds, relying on neb sab at baseline and gradually worse x 2 weeks but still p neb can get comfortable at rest, sob across the room.  " Prednisone is the only thing that helps me but it wears off over a week:"  No obvious day to day or daytime variabilty or assoc   cp or chest tightness, subjective wheeze overt sinus or hb symptoms. No unusual exp hx or h/o childhood pna/ asthma or knowledge of premature birth.  Sleeping ok without nocturnal  or early am exacerbation  of respiratory  c/o's or need for noct saba but still using neb saba at  baseline first thing in am "I thought I was supposed to" . Also denies any obvious fluctuation of symptoms with weather or environmental changes or other aggravating or alleviating factors except as outlined above   Current Medications, Allergies, Complete Past Medical History, Past Surgical History, Family History, and Social History were reviewed in [facility] electronic medical record.  ROS  The following are not active complaints unless bolded sore throat, dysphagia, dental problems, itching, sneezing,  nasal congestion or excess/ purulent secretions, ear ache,   fever, chills, sweats, unintended wt loss, pleuritic or exertional cp, hemoptysis,  orthopnea pnd or leg swelling, presyncope, palpitations, heartburn, abdominal pain, anorexia, nausea, vomiting, diarrhea  or change in bowel or urinary habits, change in stools or urine, dysuria,hematuria,  rash, arthralgias, visual complaints, headache, numbness weakness or ataxia or problems with walking or coordination,  change in mood/affect or memory.             Objective:   Physical Exam  amb bm nad  Wt Readings from Last 3 Encounters:  02/03/14 177 lb (80.287 kg)  01/27/14 177 lb 12 oz (80.627 kg)  01/06/14 173 lb 8 oz (78.699 kg)      HEENT mild turbinate edema.  Oropharynx no thrush or excess pnd or cobblestoning.  No JVD or cervical adenopathy. Mild  accessory muscle hypertrophy. Trachea midline, nl thryroid. Chest was hyperinflated by percussion with diminished breath sounds and moderate increased exp time with bilateral mid exp wheeze/cough. Hoover sign positive at mid inspiration. Regular rate and rhythm without murmur gallop or rub or increase P2 or edema.  Abd: no hsm, nl excursion. Ext warm without cyanosis or clubbing.      cxr 07/07/13  No acute cardiopulmonary abnormalities.     Assessment & Plan:

## 2014-02-03 NOTE — Patient Instructions (Addendum)
Symbicort 160 Take 2 puffs first thing in am and then another 2 puffs about 12 hours later.   Work on inhaler technique:  relax and gently blow all the way out then take a nice smooth deep breath back in, triggering the inhaler at same time you start breathing in.  Hold for up to 5 seconds if you can.  Rinse and gargle with water when done  Prednisone 10 mg take  4 each am x 2 days,   2 each am x 2 days,  1 each am x 2 days and stop   Only use your albuterol (ventolin)  as a rescue medication to be used if you can't catch your breath by resting or doing a relaxed purse lip breathing pattern.  - The less you use it, the better it will work when you need it. - Ok to use up to 2 puffs  every 4 hours if you must but call for immediate appointment if use goes up over your usual need - Don't leave home without it !!  (think of it like the spare tire for your car)   If not better, then use  Nebulizer up to every   4h h ours as needed   Please schedule a follow up office visit in 2  Weeks with Dr Vassie LollAlva , sooner if needed but bring all  inhalers with you

## 2014-02-03 NOTE — Telephone Encounter (Signed)
Spoke with pt spouse and she states the pt is having increased wheezing, and productive cough with white phlegm x 3 days. Appt set to see MW this afternoon at 3:15. Carron CurieJennifer Castillo, CMA

## 2014-02-04 NOTE — Assessment & Plan Note (Addendum)
GOLD III criteria at baseline but very difficult to control.  DDX of  difficult airways management all start with A and  include Adherence, Ace Inhibitors, Acid Reflux, Active Sinus Disease, Alpha 1 Antitripsin deficiency, Anxiety masquerading as Airways dz,  ABPA,  allergy(esp in young), Aspiration (esp in elderly), Adverse effects of DPI,  Active smokers, plus two Bs  = Bronchiectasis and Beta blocker use..and one C= CHF  Adherence is always the initial "prime suspect" and is a multilayered concern that requires a "trust but verify" approach in every patient - starting with knowing how to use medications, especially inhalers, correctly, keeping up with refills and understanding the fundamental difference between maintenance and prns vs those medications only taken for a very short course and then stopped and not refilled.  - very confused with meds, would benefit from med calendar if he'd agree to use it - The proper method of use, as well as anticipated side effects, of a metered-dose inhaler are discussed and demonstrated to the patient. Improved effectiveness after extensive coaching during this visit to a level of approximately  75% from a baseline of near 0 so keep trying symbicort hfa 160 2bid with option of laba/ics neb next ov if not better  ? Allergy / asthmatic phenotype suggested by prev resp to systemic steroids > Prednisone 10 mg take  4 each am x 2 days,   2 each am x 2 days,  1 each am x 2 days and stop   ? Acid (or non-acid) GERD > always difficult to exclude as up to 75% of pts in some series report no assoc GI/ Heartburn symptoms> rec continue max (24h)  acid suppression and diet restrictions/ reviewed        Each maintenance medication was reviewed in detail including most importantly the difference between maintenance and as needed and under what circumstances the prns are to be used.  Please see instructions for details which were reviewed in writing and the patient given a copy.

## 2014-02-06 ENCOUNTER — Other Ambulatory Visit: Payer: Self-pay

## 2014-02-07 ENCOUNTER — Ambulatory Visit
Admission: RE | Admit: 2014-02-07 | Discharge: 2014-02-07 | Disposition: A | Discharge status: Home / Self Care | Payer: Medicare Other | Source: Ambulatory Visit | Attending: Internal Medicine | Admitting: Internal Medicine

## 2014-02-07 DIAGNOSIS — I10 Essential (primary) hypertension: Secondary | ICD-10-CM

## 2014-02-07 DIAGNOSIS — R413 Other amnesia: Secondary | ICD-10-CM

## 2014-02-10 ENCOUNTER — Other Ambulatory Visit: Payer: Self-pay

## 2014-02-10 MED ORDER — PANTOPRAZOLE SODIUM 20 MG PO TBEC: 20.0000 mg | DELAYED_RELEASE_TABLET | Freq: Every day | ORAL | Status: DC | PRN

## 2014-02-11 ENCOUNTER — Other Ambulatory Visit: Payer: Self-pay

## 2014-02-11 ENCOUNTER — Telehealth: Payer: Self-pay | Admitting: Internal Medicine

## 2014-02-11 MED ORDER — PANTOPRAZOLE SODIUM 20 MG PO TBEC: 20.0000 mg | DELAYED_RELEASE_TABLET | Freq: Every day | ORAL | Status: DC | PRN

## 2014-02-11 NOTE — Telephone Encounter (Signed)
Pt informed of MRI results.

## 2014-02-11 NOTE — Telephone Encounter (Signed)
LVM for pt to call back.

## 2014-02-11 NOTE — Telephone Encounter (Signed)
Patient is calling for the results of his MRI.    Please advise

## 2014-02-16 ENCOUNTER — Ambulatory Visit: Payer: Medicare Other | Admitting: Adult Health

## 2014-02-16 ENCOUNTER — Other Ambulatory Visit: Payer: Self-pay | Admitting: Critical Care Medicine

## 2014-02-19 ENCOUNTER — Ambulatory Visit: Payer: Medicare Other | Admitting: Adult Health

## 2014-03-13 ENCOUNTER — Encounter: Payer: Self-pay | Admitting: Internal Medicine

## 2014-03-30 ENCOUNTER — Other Ambulatory Visit: Payer: Self-pay | Admitting: Internal Medicine

## 2014-04-10 ENCOUNTER — Other Ambulatory Visit: Payer: Self-pay | Admitting: Cardiovascular Disease

## 2014-04-11 ENCOUNTER — Other Ambulatory Visit: Payer: Self-pay | Admitting: Cardiovascular Disease

## 2014-04-13 NOTE — Telephone Encounter (Signed)
Rx was sent to pharmacy electronically. 

## 2014-04-27 ENCOUNTER — Other Ambulatory Visit: Payer: Self-pay | Admitting: *Deleted

## 2014-04-27 MED ORDER — ROSUVASTATIN CALCIUM 10 MG PO TABS
10.0000 mg | ORAL_TABLET | Freq: Every day | ORAL | Status: DC
Start: 2014-04-27 — End: 2014-09-15

## 2014-05-10 ENCOUNTER — Other Ambulatory Visit: Payer: Self-pay | Admitting: Cardiovascular Disease

## 2014-05-22 ENCOUNTER — Emergency Department (HOSPITAL_COMMUNITY): Payer: Medicare Other

## 2014-05-22 ENCOUNTER — Encounter (HOSPITAL_COMMUNITY): Payer: Self-pay | Admitting: Emergency Medicine

## 2014-05-22 ENCOUNTER — Emergency Department (HOSPITAL_COMMUNITY)
Admission: EM | Admit: 2014-05-22 | Discharge: 2014-05-22 | Disposition: A | Discharge status: Home / Self Care | Payer: Medicare Other | Attending: Emergency Medicine | Admitting: Emergency Medicine

## 2014-05-22 DIAGNOSIS — Z8719 Personal history of other diseases of the digestive system: Secondary | ICD-10-CM | POA: Diagnosis not present

## 2014-05-22 DIAGNOSIS — R110 Nausea: Secondary | ICD-10-CM | POA: Diagnosis not present

## 2014-05-22 DIAGNOSIS — R140 Abdominal distension (gaseous): Secondary | ICD-10-CM | POA: Diagnosis not present

## 2014-05-22 DIAGNOSIS — J449 Chronic obstructive pulmonary disease, unspecified: Secondary | ICD-10-CM | POA: Insufficient documentation

## 2014-05-22 DIAGNOSIS — Z9889 Other specified postprocedural states: Secondary | ICD-10-CM | POA: Insufficient documentation

## 2014-05-22 DIAGNOSIS — H409 Unspecified glaucoma: Secondary | ICD-10-CM | POA: Insufficient documentation

## 2014-05-22 DIAGNOSIS — Z7952 Long term (current) use of systemic steroids: Secondary | ICD-10-CM | POA: Diagnosis not present

## 2014-05-22 DIAGNOSIS — R109 Unspecified abdominal pain: Secondary | ICD-10-CM

## 2014-05-22 DIAGNOSIS — Z79899 Other long term (current) drug therapy: Secondary | ICD-10-CM | POA: Insufficient documentation

## 2014-05-22 DIAGNOSIS — I10 Essential (primary) hypertension: Secondary | ICD-10-CM | POA: Insufficient documentation

## 2014-05-22 DIAGNOSIS — E785 Hyperlipidemia, unspecified: Secondary | ICD-10-CM | POA: Diagnosis not present

## 2014-05-22 DIAGNOSIS — Z87891 Personal history of nicotine dependence: Secondary | ICD-10-CM | POA: Diagnosis not present

## 2014-05-22 DIAGNOSIS — K5900 Constipation, unspecified: Secondary | ICD-10-CM | POA: Diagnosis present
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
## 2014-05-22 NOTE — Discharge Instructions (Signed)
Follow-up with Dr. Loreta AveMann next week. Return here for fever, vomiting, or severe pain

## 2014-05-22 NOTE — ED Notes (Addendum)
Pt reports having hemmorroid procedure this summer and ever since has had difficulty having bowel movements and this week has been the worst. Last BM 6 days ago. Pt reports nausea.

## 2014-05-22 NOTE — ED Notes (Signed)
Patient transported to X-ray 

## 2014-05-25 ENCOUNTER — Ambulatory Visit (INDEPENDENT_AMBULATORY_CARE_PROVIDER_SITE_OTHER): Payer: Medicare Other | Admitting: Internal Medicine

## 2014-05-25 ENCOUNTER — Other Ambulatory Visit (INDEPENDENT_AMBULATORY_CARE_PROVIDER_SITE_OTHER): Payer: Medicare Other

## 2014-05-25 ENCOUNTER — Encounter: Payer: Self-pay | Admitting: Internal Medicine

## 2014-05-25 VITALS — BP 100/60 | HR 76 | Temp 98.9°F | Resp 16 | Ht 67.0 in | Wt 175.2 lb

## 2014-05-25 DIAGNOSIS — I10 Essential (primary) hypertension: Secondary | ICD-10-CM

## 2014-05-25 DIAGNOSIS — R739 Hyperglycemia, unspecified: Secondary | ICD-10-CM

## 2014-05-25 DIAGNOSIS — Z0000 Encounter for general adult medical examination without abnormal findings: Secondary | ICD-10-CM | POA: Diagnosis not present

## 2014-05-25 DIAGNOSIS — R413 Other amnesia: Secondary | ICD-10-CM | POA: Diagnosis not present

## 2014-05-25 DIAGNOSIS — J418 Mixed simple and mucopurulent chronic bronchitis: Secondary | ICD-10-CM | POA: Diagnosis not present

## 2014-05-25 LAB — HEMOGLOBIN A1C: Hgb A1c MFr Bld: 6.1 % (ref 4.6–6.5)

## 2014-05-25 LAB — TSH: TSH: 0.71 u[IU]/mL (ref 0.35–4.50)
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
## 2014-05-25 NOTE — Patient Instructions (Signed)
Your next office appointment will be determined based upon review of your pending labs . Those instructions will be transmitted to you  by mail Critical values will be called   Followup as needed for your acute issue. Please report any significant change in your symptoms. 

## 2014-05-25 NOTE — Progress Notes (Signed)
Pre visit review using our clinic review tool, if applicable. No additional management support is needed unless otherwise documented below in the visit note. 

## 2014-05-26 ENCOUNTER — Telehealth: Payer: Self-pay | Admitting: Internal Medicine

## 2014-05-26 LAB — RPR

## 2014-05-26 NOTE — Telephone Encounter (Signed)
Pt request phone call concern about the lab result, no clear

## 2014-05-26 NOTE — Telephone Encounter (Signed)
Phone call placed to patient. Wife answered. Patient was wanting his wife to call and find out about results that I spoke to him this morning on. I let her know the same thing that all labs are normal and waiting on neurology referral appointment.

## 2014-05-28 ENCOUNTER — Telehealth: Payer: Self-pay | Admitting: *Deleted

## 2014-05-28 NOTE — Telephone Encounter (Signed)
Patient walk-in, requesting Crestor 10mg  daily.  No samples available, lmom requesting callback for pharmacy to send refill to.

## 2014-05-31 ENCOUNTER — Other Ambulatory Visit: Payer: Self-pay | Admitting: Cardiovascular Disease

## 2014-06-01 NOTE — Telephone Encounter (Signed)
Rx(s) sent to pharmacy electronically. OV 07/29/14 

## 2014-06-04 ENCOUNTER — Other Ambulatory Visit: Payer: Self-pay | Admitting: Cardiovascular Disease

## 2014-06-04 NOTE — Telephone Encounter (Signed)
Rx(s) sent to pharmacy electronically. OV 07/29/14 

## 2014-06-16 ENCOUNTER — Other Ambulatory Visit: Payer: Self-pay | Admitting: Pulmonary Disease

## 2014-06-26 ENCOUNTER — Other Ambulatory Visit: Payer: Self-pay | Admitting: Internal Medicine

## 2014-07-08 ENCOUNTER — Other Ambulatory Visit: Payer: Self-pay | Admitting: Cardiovascular Disease

## 2014-07-09 NOTE — Telephone Encounter (Signed)
Rx(s) sent to pharmacy electronically.  

## 2014-07-10 ENCOUNTER — Encounter: Payer: Self-pay | Admitting: Neurology

## 2014-07-10 ENCOUNTER — Ambulatory Visit (INDEPENDENT_AMBULATORY_CARE_PROVIDER_SITE_OTHER): Payer: Medicare Other | Admitting: Neurology

## 2014-07-10 VITALS — BP 120/64 | HR 73 | Resp 16 | Wt 180.0 lb

## 2014-07-10 DIAGNOSIS — F0390 Unspecified dementia without behavioral disturbance: Secondary | ICD-10-CM | POA: Diagnosis not present

## 2014-07-10 DIAGNOSIS — F03A0 Unspecified dementia, mild, without behavioral disturbance, psychotic disturbance, mood disturbance, and anxiety: Secondary | ICD-10-CM

## 2014-07-10 MED ORDER — DONEPEZIL HCL 10 MG PO TABS: ORAL_TABLET | ORAL | Status: DC

## 2014-07-10 NOTE — Patient Instructions (Addendum)
1. You will be starting a medicine called Aricept 10mg : Take 1/2 tablet daily for 1 week, then increase to 1 tablet daily. This is used for symptoms that could progress to dementia 2. Monitor driving. If family expresses concern, recommend discussing this with them and stopping driving if this becomes a concern 3. Physical exercise and brain stimulation exercises are important for brain health 4. Follow-up in 3 months
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
## 2014-07-15 ENCOUNTER — Encounter: Payer: Self-pay | Admitting: Neurology

## 2014-07-16 DIAGNOSIS — F0390 Unspecified dementia without behavioral disturbance: Secondary | ICD-10-CM | POA: Insufficient documentation

## 2014-07-16 DIAGNOSIS — F03A0 Unspecified dementia, mild, without behavioral disturbance, psychotic disturbance, mood disturbance, and anxiety: Secondary | ICD-10-CM | POA: Insufficient documentation

## 2014-07-22 ENCOUNTER — Ambulatory Visit (INDEPENDENT_AMBULATORY_CARE_PROVIDER_SITE_OTHER): Payer: Medicare Other | Admitting: Internal Medicine

## 2014-07-22 ENCOUNTER — Encounter: Payer: Self-pay | Admitting: Internal Medicine

## 2014-07-22 VITALS — BP 134/72 | HR 83 | Temp 98.5°F | Resp 15 | Ht 67.0 in | Wt 174.0 lb

## 2014-07-22 DIAGNOSIS — N62 Hypertrophy of breast: Secondary | ICD-10-CM

## 2014-07-22 DIAGNOSIS — L299 Pruritus, unspecified: Secondary | ICD-10-CM

## 2014-07-22 MED ORDER — TRIAMCINOLONE ACETONIDE 0.5 % EX OINT: 1.0000 "application " | TOPICAL_OINTMENT | Freq: Two times a day (BID) | CUTANEOUS | Status: DC

## 2014-07-22 NOTE — Patient Instructions (Signed)
Mix Eucerin 1 part to Triamcinolone 0.5% 1 part and apply twice a day to itchy area of skin as needed .  Fibrocystic breast changes can be aggravated by caffeine intake (coffee, tea, cola, or chocolate). Vitamin E 400 international units daily may help related symptoms.  If you have breast discomfort or feel knots; mammograms would be indicated.

## 2014-07-22 NOTE — Progress Notes (Signed)
Pre visit review using our clinic review tool, if applicable. No additional management support is needed unless otherwise documented below in the visit note. 

## 2014-07-22 NOTE — Progress Notes (Signed)
   Subjective:    Patient ID: Alexander MorelHarry L Dupee, male    DOB: 1932-10-09, 81 y.o.   MRN: [id number]  HPI He describes intermittent itching of the left side of the chest over the last 2 months. After itching this area repeatedly he has noted a small skin lesion. There was no associated vesicle formation or purulence. He has similar episode of itching w/o any skin lesions 2 years ago which resolved with topical triamcinolone 0.1%. It does not appear to be helping at this time.  He was evaluated at that time for for allergies but the evaluation was negative.  He does have some itchy/watery eyes but denies sneezing.  He has no constitutional symptoms.  His wife questions breast enlargement. He has no associated pain, discharge, or palpable masses. He is not on spironolactone. His last liver function tests were normal in July 2015.  Review of Systems  Swelling of the lips or tongue or intraoral lesions denied.  Shortness of breath, wheezing, or cough absent.  Fever ,chills , or sweats denied.   Diarrhea not present but bowel changes post hemorrhoidal banding.  No dysuria, pyuria or hematuria.     Objective:   Physical Exam  Pertinent or positive findings include: Prominent lower lids. An S4 is noted.  Breath sounds are decreased.  He has a 4 x 4 millimeter slightly brownish lesion over the left parasternal area which suggests a granuloma SQ.  There is asymmetry of the breasts with the left greater than right. No significant fibrocystic changes are noted on palpation.  He has no axillary or cervical lymphadenopathy.  He does have dullness to percussion the right quadrant.  General appearance :adequately nourished; in no distress. Eyes: No conjunctival inflammation or scleral icterus is present. Oral exam:  Lips and gums are healthy appearing.There is no oropharyngeal erythema or exudate noted.  Heart:  Normal rate and regular rhythm. S1 and S2 normal without gallop, murmur,  click,or rub .   Lungs:Chest clear to auscultation; no wheezes, rhonchi,rales ,or rubs present.No increased work of breathing.  Abdomen: bowel sounds normal, soft and non-tender without masses, organomegaly or hernias noted.  No guarding or rebound.  Skin:Warm & dry.  Intact without suspicious lesions or rashes ; no tenting  Neuro: Strength, tone  normal.        Assessment & Plan:  #1 SQ granuloma post repeated scratching #2 pseudogynecomastia See orders

## 2014-07-27 ENCOUNTER — Other Ambulatory Visit: Payer: Self-pay | Admitting: Cardiovascular Disease

## 2014-07-27 NOTE — Telephone Encounter (Signed)
Rx(s) sent to pharmacy electronically. OV 07/29/2014

## 2014-07-29 ENCOUNTER — Encounter: Payer: Self-pay | Admitting: Cardiovascular Disease

## 2014-07-29 ENCOUNTER — Ambulatory Visit (INDEPENDENT_AMBULATORY_CARE_PROVIDER_SITE_OTHER): Payer: Medicare Other | Admitting: Cardiovascular Disease

## 2014-07-29 VITALS — BP 130/64 | HR 60 | Ht 67.5 in | Wt 179.7 lb

## 2014-07-29 DIAGNOSIS — Z79899 Other long term (current) drug therapy: Secondary | ICD-10-CM | POA: Diagnosis not present

## 2014-07-29 DIAGNOSIS — R0602 Shortness of breath: Secondary | ICD-10-CM | POA: Diagnosis not present

## 2014-07-29 DIAGNOSIS — R9431 Abnormal electrocardiogram [ECG] [EKG]: Secondary | ICD-10-CM

## 2014-07-29 DIAGNOSIS — R5382 Chronic fatigue, unspecified: Secondary | ICD-10-CM

## 2014-07-29 DIAGNOSIS — I517 Cardiomegaly: Secondary | ICD-10-CM

## 2014-07-29 DIAGNOSIS — R0609 Other forms of dyspnea: Secondary | ICD-10-CM

## 2014-07-29 DIAGNOSIS — E785 Hyperlipidemia, unspecified: Secondary | ICD-10-CM | POA: Diagnosis not present

## 2014-07-29 NOTE — Patient Instructions (Signed)
Your physician recommends that you return for lab work FASTING.  Your physician has requested that you have a lexiscan myoview. For further information please visit [email or web address]. Please follow instruction sheet, as given.   Your physician recommends that you schedule a follow-up appointment in: 6 Weeks with Dr. Tresa EndoKelly.

## 2014-07-31 ENCOUNTER — Emergency Department (HOSPITAL_COMMUNITY): Payer: Medicare Other

## 2014-07-31 ENCOUNTER — Encounter (HOSPITAL_COMMUNITY): Payer: Self-pay | Admitting: *Deleted

## 2014-07-31 ENCOUNTER — Emergency Department (HOSPITAL_COMMUNITY)
Admission: EM | Admit: 2014-07-31 | Discharge: 2014-07-31 | Disposition: A | Discharge status: Home / Self Care | Payer: Medicare Other | Attending: Emergency Medicine | Admitting: Emergency Medicine

## 2014-07-31 ENCOUNTER — Encounter: Payer: Self-pay | Admitting: Cardiovascular Disease

## 2014-07-31 DIAGNOSIS — R0602 Shortness of breath: Secondary | ICD-10-CM

## 2014-07-31 DIAGNOSIS — Z79899 Other long term (current) drug therapy: Secondary | ICD-10-CM | POA: Diagnosis not present

## 2014-07-31 DIAGNOSIS — Z7951 Long term (current) use of inhaled steroids: Secondary | ICD-10-CM | POA: Diagnosis not present

## 2014-07-31 DIAGNOSIS — Z87891 Personal history of nicotine dependence: Secondary | ICD-10-CM | POA: Insufficient documentation

## 2014-07-31 DIAGNOSIS — I517 Cardiomegaly: Secondary | ICD-10-CM | POA: Diagnosis not present

## 2014-07-31 DIAGNOSIS — R079 Chest pain, unspecified: Secondary | ICD-10-CM | POA: Diagnosis present

## 2014-07-31 DIAGNOSIS — R110 Nausea: Secondary | ICD-10-CM | POA: Diagnosis not present

## 2014-07-31 DIAGNOSIS — J441 Chronic obstructive pulmonary disease with (acute) exacerbation: Secondary | ICD-10-CM | POA: Insufficient documentation

## 2014-07-31 DIAGNOSIS — R0789 Other chest pain: Secondary | ICD-10-CM | POA: Insufficient documentation

## 2014-07-31 DIAGNOSIS — Z9889 Other specified postprocedural states: Secondary | ICD-10-CM | POA: Insufficient documentation

## 2014-07-31 DIAGNOSIS — K5900 Constipation, unspecified: Secondary | ICD-10-CM | POA: Insufficient documentation

## 2014-07-31 DIAGNOSIS — E785 Hyperlipidemia, unspecified: Secondary | ICD-10-CM | POA: Insufficient documentation

## 2014-07-31 DIAGNOSIS — R0609 Other forms of dyspnea: Secondary | ICD-10-CM | POA: Insufficient documentation

## 2014-07-31 DIAGNOSIS — I10 Essential (primary) hypertension: Secondary | ICD-10-CM | POA: Insufficient documentation

## 2014-07-31 DIAGNOSIS — H409 Unspecified glaucoma: Secondary | ICD-10-CM | POA: Diagnosis not present

## 2014-07-31 LAB — COMPREHENSIVE METABOLIC PANEL
ALBUMIN: 4.1 g/dL (ref 3.5–5.2)
ALT: 20 U/L (ref 0–53)
AST: 20 U/L (ref 0–37)
Alkaline Phosphatase: 64 U/L (ref 39–117)
BUN: 11 mg/dL (ref 6–23)
CHLORIDE: 106 meq/L (ref 96–112)
CO2: 27 mEq/L (ref 19–32)
Calcium: 9.1 mg/dL (ref 8.4–10.5)
Creat: 0.99 mg/dL (ref 0.50–1.35)
GLUCOSE: 89 mg/dL (ref 70–99)
POTASSIUM: 4.2 meq/L (ref 3.5–5.3)
Sodium: 142 mEq/L (ref 135–145)
TOTAL PROTEIN: 6.2 g/dL (ref 6.0–8.3)
Total Bilirubin: 0.3 mg/dL (ref 0.2–1.2)

## 2014-07-31 LAB — CBC
HCT: 36.8 % — ABNORMAL LOW (ref 39.0–52.0)
HEMATOCRIT: 37.2 % — AB (ref 39.0–52.0)
Hemoglobin: 12 g/dL — ABNORMAL LOW (ref 13.0–17.0)
Hemoglobin: 12 g/dL — ABNORMAL LOW (ref 13.0–17.0)
MCH: 30 pg (ref 26.0–34.0)
MCH: 30.3 pg (ref 26.0–34.0)
MCHC: 32.3 g/dL (ref 30.0–36.0)
MCHC: 32.6 g/dL (ref 30.0–36.0)
MCV: 93 fL (ref 78.0–100.0)
MCV: 92.9 fL (ref 78.0–100.0)
MPV: 9.8 fL (ref 8.6–12.4)
PLATELETS: 298 10*3/uL (ref 150–400)
Platelets: 255 10*3/uL (ref 150–400)
RBC: 4 MIL/uL — ABNORMAL LOW (ref 4.22–5.81)
RBC: 3.96 MIL/uL — ABNORMAL LOW (ref 4.22–5.81)
RDW: 13.5 % (ref 11.5–15.5)
RDW: 12.6 % (ref 11.5–15.5)
WBC: 4.5 10*3/uL (ref 4.0–10.5)
WBC: 4.8 10*3/uL (ref 4.0–10.5)

## 2014-07-31 LAB — BASIC METABOLIC PANEL
ANION GAP: 7 (ref 5–15)
BUN: 8 mg/dL (ref 6–23)
CALCIUM: 9.2 mg/dL (ref 8.4–10.5)
CO2: 26 mmol/L (ref 19–32)
CREATININE: 1.04 mg/dL (ref 0.50–1.35)
Chloride: 108 mmol/L (ref 96–112)
GFR calc Af Amer: 77 mL/min — ABNORMAL LOW (ref >90)
GFR, EST NON AFRICAN AMERICAN: 66 mL/min — AB (ref >90)
Glucose, Bld: 108 mg/dL — ABNORMAL HIGH (ref 70–99)
Potassium: 3.9 mmol/L (ref 3.5–5.1)
Sodium: 141 mmol/L (ref 135–145)

## 2014-07-31 LAB — BRAIN NATRIURETIC PEPTIDE: B Natriuretic Peptide: 54.9 pg/mL (ref 0.0–100.0)

## 2014-07-31 LAB — I-STAT TROPONIN, ED
TROPONIN I, POC: 0.02 ng/mL (ref 0.00–0.08)
Troponin i, poc: 0.01 ng/mL (ref 0.00–0.08)

## 2014-07-31 LAB — LIPID PANEL
CHOL/HDL RATIO: 3.1 ratio
Cholesterol: 132 mg/dL (ref 0–200)
HDL: 42 mg/dL (ref >=40)
LDL CALC: 71 mg/dL (ref 0–99)
Triglycerides: 94 mg/dL (ref <150)
VLDL: 19 mg/dL (ref 0–40)

## 2014-07-31 LAB — TSH: TSH: 3.196 u[IU]/mL (ref 0.350–4.500)

## 2014-07-31 MED ORDER — ASPIRIN 81 MG PO CHEW
324.0000 mg | CHEWABLE_TABLET | Freq: Once | ORAL | Status: AC
  Administered 2014-07-31: 324 mg via ORAL
  Filled 2014-07-31: qty 4

## 2014-07-31 NOTE — ED Notes (Signed)
Pt states he is currently not in pain, but his pain comes and goes and when he is hurting he rates his pain a 7/10.

## 2014-07-31 NOTE — Discharge Instructions (Signed)

## 2014-07-31 NOTE — ED Notes (Signed)
Pt reports left side chest pains since this am, have become worse throughout the day. Reports sob due to copd. Denies swelling. ekg done at triage.

## 2014-07-31 NOTE — ED Notes (Signed)
Phlebotomy at bedside.

## 2014-07-31 NOTE — ED Notes (Signed)
Pt reports having sob that is a constant rt copd. Pt states there has been no change in respiratory pattern.
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
## 2014-08-03 ENCOUNTER — Telehealth: Payer: Self-pay | Admitting: Neurology

## 2014-08-03 NOTE — Telephone Encounter (Signed)
I spoke with patient states that when he started taking a whole tablet of the Aricept he started having pain under his left arm. He did end up going to the ED. He states while he was there they did give him 3 aspirin & took an xray. He says when he took the aspirin the pain went away. He hasn't taken any more Aricept since then.

## 2014-08-03 NOTE — Telephone Encounter (Signed)
Called patient and notified of advisement. He states he could go back to 1/2 tablet a day since he did ok with that, he states he doesn't want to take a whole tablet again. I did tell him that would be ok to take the 1/ tablet.

## 2014-08-03 NOTE — Telephone Encounter (Signed)
Pt called wanting to speak to a nurse regarding his script for DONEPEZIL 10mg / Pt states that he is not doing well with this script.  C/b [phone]

## 2014-08-03 NOTE — Telephone Encounter (Signed)
Pls let him know that it is unlikely due to Aricept, however okay to hold off for now. Thanks

## 2014-08-04 ENCOUNTER — Telehealth (HOSPITAL_COMMUNITY): Payer: Self-pay

## 2014-08-04 NOTE — Telephone Encounter (Signed)
Encounter complete. 

## 2014-08-06 ENCOUNTER — Ambulatory Visit (HOSPITAL_COMMUNITY)
Admission: RE | Admit: 2014-08-06 | Discharge: 2014-08-06 | Disposition: A | Discharge status: Home / Self Care | Payer: Medicare Other | Source: Ambulatory Visit | Attending: Cardiovascular Disease | Admitting: Cardiovascular Disease

## 2014-08-06 DIAGNOSIS — R42 Dizziness and giddiness: Secondary | ICD-10-CM | POA: Insufficient documentation

## 2014-08-06 DIAGNOSIS — R079 Chest pain, unspecified: Secondary | ICD-10-CM | POA: Diagnosis not present

## 2014-08-06 DIAGNOSIS — R0609 Other forms of dyspnea: Secondary | ICD-10-CM | POA: Insufficient documentation

## 2014-08-06 DIAGNOSIS — R9431 Abnormal electrocardiogram [ECG] [EKG]: Secondary | ICD-10-CM

## 2014-08-06 DIAGNOSIS — R0602 Shortness of breath: Secondary | ICD-10-CM | POA: Diagnosis not present

## 2014-08-06 MED ORDER — AMINOPHYLLINE 25 MG/ML IV SOLN
75.0000 mg | Freq: Once | INTRAVENOUS | Status: AC
  Administered 2014-08-06: 75 mg via INTRAVENOUS

## 2014-08-06 MED ORDER — TECHNETIUM TC 99M SESTAMIBI GENERIC - CARDIOLITE
10.9000 | Freq: Once | INTRAVENOUS | Status: AC | PRN
  Administered 2014-08-06: 10.9 via INTRAVENOUS

## 2014-08-06 MED ORDER — REGADENOSON 0.4 MG/5ML IV SOLN
0.4000 mg | Freq: Once | INTRAVENOUS | Status: AC
  Administered 2014-08-06: 0.4 mg via INTRAVENOUS

## 2014-08-06 MED ORDER — TECHNETIUM TC 99M SESTAMIBI GENERIC - CARDIOLITE
32.7000 | Freq: Once | INTRAVENOUS | Status: AC | PRN
  Administered 2014-08-06: 32.7 via INTRAVENOUS

## 2014-08-06 NOTE — Procedures (Addendum)
[facility] [address] [place] [address] [phone]  Cardiology Nuclear Med Study  Marcelina MorelHarry L [name] is a 81 y.o. male     MRN : [id number]     DOB: 03-Aug-1932  Procedure Date: 08/06/2014  Nuclear Med Background Indication for Stress Test:  Evaluation for Ischemia and Post Hospital History:  COPD and CATH 2010-NORMAL;Last NUC MPI on 09/12/2010-scar;EF=58%; Cardiac Risk Factors: Family History - CAD, History of Smoking, Hypertension and Lipids  Symptoms:  Chest Pain, Dizziness, DOE, Fatigue, Light-Headedness and SOB   Nuclear Pre-Procedure Caffeine/Decaff Intake:  7:00pm NPO After: 3:00am   IV Site: R Forearm  IV 0.9% NS with Angio Cath:  22g  Chest Size (in):  42" IV Started by: Berdie OgrenAmanda Wease, RN  Height: 5' 7.5" (1.715 m)  Cup Size: n/a  BMI:  Body mass index is 27.61 kg/(m^2). Weight:  179 lb (81.194 kg)   Tech Comments:  n/a    Nuclear Med Study 1 or 2 day study: 1 day  Stress Test Type:  Lexiscan  Order Authorizing Provider:  Nicki Guadalajarahomas Kelly, MD   Resting Radionuclide: Technetium 3855m Sestamibi  Resting Radionuclide Dose: 10.9 mCi   Stress Radionuclide:  Technetium 2655m Sestamibi  Stress Radionuclide Dose: 32.7 mCi           Stress Protocol Rest HR: 55 Stress HR: 69  Rest BP: 150/93 Stress BP: 159/101  Exercise Time (min): n/a METS: n/a          Dose of Adenosine (mg):  n/a Dose of Lexiscan: 0.4 mg  Dose of Atropine (mg): n/a Dose of Dobutamine: n/a mcg/kg/min (at max HR)  Stress Test Technologist: Esperanza Sheetserry-Marie Martin, CCT Nuclear Technologist:Elizabeth Young,CNMT   Rest Procedure:  Myocardial perfusion imaging was performed at rest 45 minutes following the intravenous administration of Technetium 8255m Sestamibi. Stress Procedure:  The patient received IV Lexiscan 0.4 mg over 15-seconds.  Technetium 2455m Sestamibi injected IV at 30-seconds.  Patient experienced SOB and stomach discomfort and 75 mg  Aminophylline IV was administered.There were no significant changes with Lexiscan.  Quantitative spect images were obtained after a 45 minute delay.  Transient Ischemic Dilatation (Normal <1.22):  1.14  QGS EDV:  96 ml QGS ESV:  37 ml LV Ejection Fraction: 61%     Rest ECG:  NSR with non-specific ST-T wave changes  Stress ECG: No significant change from baseline ECG  QPS Raw Data Images:  Normal; no motion artifact; normal heart/lung ratio. Stress Images:  There is decreased uptake in the inferior wall. The abnormality is mild and involves the basal and mid inferior wall segments Rest Images:  Comparison with the stress images reveals no significant change. Subtraction (SDS):  There is a fixed inferior defect that is most consistent with diaphragmatic attenuation. LV Wall Motion:  NL LV Function; NL Wall Motion  Impression Exercise Capacity:  Lexiscan with no exercise. BP Response:  Normal blood pressure response. Clinical Symptoms:  No significant symptoms noted. ECG Impression:  No significant ECG changes with Lexiscan. Comparison with Prior Nuclear Study: No images to compare   Overall Impression:  Low risk stress nuclear study with mild diaphragmatic attenuation artifact.   Thurmon FairROITORU,Anyela Napierkowski, MD  08/06/2014 1:01 PM

## 2014-08-14 ENCOUNTER — Telehealth: Payer: Self-pay | Admitting: Cardiovascular Disease

## 2014-08-14 NOTE — Telephone Encounter (Signed)
New message ° ° ° ° °Want stress test results °

## 2014-08-18 ENCOUNTER — Encounter: Payer: Self-pay | Admitting: *Deleted

## 2014-08-18 NOTE — Telephone Encounter (Signed)
Pt's wife called in stating that he had a stress on 4/14 and has not heard the results yet. Please call  Thanks

## 2014-08-18 NOTE — Telephone Encounter (Signed)
Pt presented in person, inquiring about stress test results. Informed patient I do not see results are read yet, would request Dr. Tresa EndoKelly to review as promptly as able.  Will contact patient w/ results.

## 2014-08-18 NOTE — Telephone Encounter (Signed)
Phone rings w/ no pickup

## 2014-08-18 NOTE — Telephone Encounter (Signed)
No answer when called 

## 2014-08-18 NOTE — Telephone Encounter (Signed)
This nuclear study was reviewed by me on 4/14 and [facility] put in "my chart"  Low risk; no ischemia

## 2014-08-19 NOTE — Telephone Encounter (Signed)
Pt. Informed about his stress test 

## 2014-08-21 ENCOUNTER — Ambulatory Visit: Payer: Medicare Other | Admitting: Internal Medicine

## 2014-08-25 ENCOUNTER — Telehealth: Payer: Self-pay | Admitting: Internal Medicine

## 2014-08-25 MED ORDER — AZITHROMYCIN 250 MG PO TABS: ORAL_TABLET | ORAL | Status: DC

## 2014-08-25 MED ORDER — PREDNISONE 10 MG PO TABS: ORAL_TABLET | ORAL | Status: DC

## 2014-08-25 NOTE — Telephone Encounter (Signed)
Spoke with spouse. She reports pt was outside cutting grass and now has prod cough (green phlem). No wheezing, no chest tx, no SOB, no PND, no nasal cong. Pt wants pred called in. Pt last saw MW for acute visit 02/03/14. No pending appt. Please advise RA thanks  No Known Allergies

## 2014-08-25 NOTE — Telephone Encounter (Signed)
Needs OV with TP OK for ZPAK Prednisone 10 mg tabs  Take 2 tabs daily with food x 5ds, then 1 tab daily with food x 5ds then STOP

## 2014-08-25 NOTE — Telephone Encounter (Signed)
Rx sent to pharmacy.  Patient scheduled for follow up with TP.  Patient notified. Nothing further needed.

## 2014-08-29 ENCOUNTER — Other Ambulatory Visit: Payer: Self-pay | Admitting: Cardiovascular Disease

## 2014-08-31 NOTE — Telephone Encounter (Signed)
Rx refill sent to patient pharmacy   

## 2014-09-03 ENCOUNTER — Ambulatory Visit (INDEPENDENT_AMBULATORY_CARE_PROVIDER_SITE_OTHER): Payer: Medicare Other | Admitting: Adult Health

## 2014-09-03 ENCOUNTER — Encounter: Payer: Self-pay | Admitting: Adult Health

## 2014-09-03 VITALS — BP 120/74 | HR 67 | Ht 67.0 in | Wt 181.0 lb

## 2014-09-03 DIAGNOSIS — J441 Chronic obstructive pulmonary disease with (acute) exacerbation: Secondary | ICD-10-CM

## 2014-09-03 MED ORDER — BUDESONIDE-FORMOTEROL FUMARATE 160-4.5 MCG/ACT IN AERO: 2.0000 | INHALATION_SPRAY | Freq: Two times a day (BID) | RESPIRATORY_TRACT | Status: DC

## 2014-09-03 MED ORDER — PREDNISONE 10 MG PO TABS: ORAL_TABLET | ORAL | Status: DC

## 2014-09-03 MED ORDER — AZITHROMYCIN 250 MG PO TABS: ORAL_TABLET | ORAL | Status: AC

## 2014-09-03 NOTE — Addendum Note (Signed)
Addended by: Karalee HeightOX, Pari Lombard P on: 09/03/2014 11:16 AM   Modules accepted: Orders

## 2014-09-03 NOTE — Patient Instructions (Addendum)
Zpack take as directed.  Prednisone taper over next week .  Mucinex DM Twice daily  As needed  Cough/congestion  Claritin 10mg  daily As needed  Drainage  Please contact office for sooner follow up if symptoms do not improve or worsen or seek emergency care  Follow up Dr. Vassie LollAlva  In 6-8 weeks and As needed

## 2014-09-03 NOTE — Progress Notes (Signed)
  Subjective:    Patient ID: Alexander MorelHarry L [name], male    DOB: 1933-02-01, 81 y.o.   MRN: [id number]  HPI80/M, ex smoker for FU of gold C COPD & dyspnea   TESTS Cardiac evaluation - echo nml Stress test - peak VO2 68%, limited by ventilation c. cath [name] -11/10 - nml LV fn, nml coronaries  Spirometry >>FEV1 1.14 - 45% -moderate airway obstruction  09/03/2014 Acute OV  Complains of 1 week c/o occasional SOB, prod cough with thick clear mucus with  Chest congestion, wheezing. Pt states no better after prednisone.  Was called in zpack and prednisone 10mg  last week  Did not fill zpack . Only had few days of predniosne -says it was not enough.  Feels like he needs more.  Denies chest pain, orthopnea, edema or fever.   Review of Systems neg for any significant sore throat, dysphagia, itching, sneezing, nasal congestion or excess/ purulent secretions, fever, chills, sweats, unintended wt loss, pleuritic or exertional cp, hempoptysis, orthopnea pnd or change in chronic leg swelling. Also denies presyncope, palpitations, heartburn, abdominal pain, nausea, vomiting, diarrhea or change in bowel or urinary habits, dysuria,hematuria, rash, arthralgias, visual complaints, headache, numbness weakness or ataxia.     Objective:   Physical Exam  Gen. Pleasant, elderly in no distress ENT - no lesions, no post nasal drip Neck: No JVD, no thyromegaly, no carotid bruits Lungs: no use of accessory muscles, no dullness to percussion, no wheezing  Cardiovascular: Rhythm regular, heart sounds  normal, no murmurs or gallops, no peripheral edema Musculoskeletal: No deformities, no cyanosis or clubbing         Assessment & Plan:

## 2014-09-03 NOTE — Assessment & Plan Note (Signed)
Slow to resolve flare   Plan  Zpack take as directed.  Prednisone taper over next week .  Mucinex DM Twice daily  As needed  Cough/congestion  Claritin 10mg  daily As needed  Drainage  Please contact office for sooner follow up if symptoms do not improve or worsen or seek emergency care  Follow up Dr. Vassie LollAlva  In 6-8 weeks and As needed

## 2014-09-10 ENCOUNTER — Telehealth: Payer: Self-pay | Admitting: Cardiovascular Disease

## 2014-09-11 NOTE — Telephone Encounter (Signed)
Closed encounter °

## 2014-09-15 ENCOUNTER — Ambulatory Visit (INDEPENDENT_AMBULATORY_CARE_PROVIDER_SITE_OTHER): Payer: Medicare Other | Admitting: Cardiovascular Disease

## 2014-09-15 VITALS — BP 106/70 | HR 66 | Ht 67.0 in | Wt 179.4 lb

## 2014-09-15 DIAGNOSIS — E785 Hyperlipidemia, unspecified: Secondary | ICD-10-CM | POA: Diagnosis not present

## 2014-09-15 DIAGNOSIS — R9431 Abnormal electrocardiogram [ECG] [EKG]: Secondary | ICD-10-CM | POA: Diagnosis not present

## 2014-09-15 DIAGNOSIS — I517 Cardiomegaly: Secondary | ICD-10-CM

## 2014-09-15 DIAGNOSIS — I10 Essential (primary) hypertension: Secondary | ICD-10-CM | POA: Diagnosis not present

## 2014-09-15 MED ORDER — ROSUVASTATIN CALCIUM 10 MG PO TABS
10.0000 mg | ORAL_TABLET | Freq: Every day | ORAL | Status: DC
Start: 2014-09-15 — End: 2014-12-21

## 2014-09-15 NOTE — Patient Instructions (Signed)
Your physician wants you to follow-up in: 6 months or sooner if needed. You will receive a reminder letter in the mail two months in advance. If you don't receive a letter, please call our office to schedule the follow-up appointment. 

## 2014-09-17 ENCOUNTER — Encounter: Payer: Self-pay | Admitting: Cardiovascular Disease

## 2014-09-17 DIAGNOSIS — R9431 Abnormal electrocardiogram [ECG] [EKG]: Secondary | ICD-10-CM | POA: Insufficient documentation
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
## 2014-09-22 ENCOUNTER — Telehealth: Payer: Self-pay | Admitting: Cardiovascular Disease

## 2014-09-22 NOTE — Telephone Encounter (Signed)
Alexander [name] called in stating that a prior authorization is needed for the pt's Crestor prescription. The number to call is [phone]. Please f/u   Thanks

## 2014-09-24 NOTE — Telephone Encounter (Signed)
Pt came in requesting Prior Authorization be completed for his Crestor.  He requested brand-name only - I advised this may not be approved since Crestor now has a generic option, but could send to insurance to review. He voiced understanding.  I spoke w. Burna MortimerWanda. Pt received a discount card at last OV which should make his responsibility for drug copay $3.   [facility], he confirms this is the case, states pt can do this but that a PA would be needed in order to get the brand name. However, patient can get the generic w/o need of PA. His obligation for a copay would then be $20.  Affirmed this.  I did submit a PA for Crestor approval, completed 1:45pm today. Will route to [place] for f/u.

## 2014-09-24 NOTE — Telephone Encounter (Signed)
Can this encounter be closed?

## 2014-09-28 ENCOUNTER — Telehealth: Payer: Self-pay | Admitting: Internal Medicine

## 2014-09-28 MED ORDER — HYDROCHLOROTHIAZIDE 12.5 MG PO CAPS: 12.5000 mg | ORAL_CAPSULE | ORAL | Status: DC

## 2014-09-28 NOTE — Telephone Encounter (Signed)
Pt request refill for hydrochlorothiazide (MICROZIDE) 12.5 MG [facility] [address]. Please help , pt is out for 3 days

## 2014-09-29 NOTE — Telephone Encounter (Signed)
Pt came in for update on Crestor - advised no approval sent on name brand Crestor, advised on communication [facility] last week regarding $20 copay - reminded him generic rosuvastatin same drug as name brand Crestor. After conversation, pt expressed understanding, asked if I could ensure that pharmacy would have medication - [facility]. I acknowledged. [facility], spoke to pharmacist who informed me med was filled this AM.

## 2014-09-29 NOTE — Telephone Encounter (Signed)
Has this been taken care of?

## 2014-10-08 ENCOUNTER — Telehealth: Payer: Self-pay | Admitting: Neurology

## 2014-10-08 NOTE — Telephone Encounter (Signed)
Pt wife called and states that they were canceling appt for 10-12-14 to see Dr Karel Jarvis for a Follow Up. They did not resch appt at this time . She stated that the patient stop taking the medication due to it making him have chest pains. Please call patient at [phone]

## 2014-10-08 NOTE — Telephone Encounter (Signed)
Returned call to patient. He states he cancelled his appt because he had stopped taking the medicine that Dr. Karel Jarvis had prescribed for him. I explained to him that even though he stopped the medicine that Dr. Karel Jarvis still wanted to see him for f/u. I put him back on the schedule for Monday at 2:30.

## 2014-10-12 ENCOUNTER — Ambulatory Visit: Payer: Medicare Other | Admitting: Neurology

## 2014-10-13 ENCOUNTER — Telehealth: Payer: Self-pay | Admitting: Internal Medicine

## 2014-10-13 NOTE — Telephone Encounter (Signed)
I signed for COPD

## 2014-10-13 NOTE — Telephone Encounter (Signed)
Spoke with pt. Informed him that Handicap form is ready for pick-up.

## 2014-10-13 NOTE — Telephone Encounter (Signed)
Patient is waiting for a handicap form to be signed. Dustin sent it back for Dr. Alwyn Ren to sign on 6/13.

## 2014-10-13 NOTE — Telephone Encounter (Signed)
I completed it designating COPD as indication

## 2014-10-13 NOTE — Telephone Encounter (Signed)
Please advise? Would you like me to complete a new form?

## 2014-10-13 NOTE — Telephone Encounter (Signed)
Dr hopper,  Do you still have this form? I cant seem to locate it. Typically you get those back up pretty quickly, but i havent seen it.

## 2014-10-27 ENCOUNTER — Telehealth: Payer: Self-pay | Admitting: Cardiovascular Disease

## 2014-10-27 NOTE — Telephone Encounter (Signed)
Pt have been having pain in his chest area going to the left arm,since he started taking the generic Crestor.Please call to advise..Marland Kitchen

## 2014-10-27 NOTE — Telephone Encounter (Signed)
Line busy when dialed. 

## 2014-10-29 NOTE — Telephone Encounter (Signed)
Instructed pt to continue for 1 week, if still problems, d/c and give us a call. Understanding verbalized.

## 2014-10-29 NOTE — Telephone Encounter (Signed)
Pt having muscle pain above chest across shoulders - states this started when he went on generic for crestor ~2 weeks ago. States pain occurs fairly predictably soon after taking med, lasts for 15-30 mins and resolves.  He denies other symptoms or concerns. He states he did not have this issue w/ name-brand. Explained muscle aches sometimes can occur w/ statins.  Will defer to Dr. Tresa EndoKelly to advise on alternative.

## 2014-10-29 NOTE — Telephone Encounter (Signed)
May not be related to med; would re try ; if persists over the next week, then dc

## 2014-11-06 ENCOUNTER — Ambulatory Visit: Payer: Medicare Other | Admitting: Pulmonary Disease

## 2014-11-18 ENCOUNTER — Telehealth: Payer: Self-pay | Admitting: Adult Health

## 2014-11-18 ENCOUNTER — Telehealth: Payer: Self-pay | Admitting: Cardiovascular Disease

## 2014-11-18 DIAGNOSIS — Z79899 Other long term (current) drug therapy: Secondary | ICD-10-CM

## 2014-11-18 MED ORDER — PREDNISONE 10 MG PO TABS: ORAL_TABLET | ORAL | Status: DC

## 2014-11-18 MED ORDER — AZITHROMYCIN 250 MG PO TABS: 250.0000 mg | ORAL_TABLET | ORAL | Status: DC

## 2014-11-18 NOTE — Telephone Encounter (Signed)
Spoke with pt wife.  Aware of rec's per RA Abx and Pred taper sent to pharmacy Nothing further needed.

## 2014-11-18 NOTE — Telephone Encounter (Signed)
Please call,still waiting tohear what to do about his Crestor. Have not heard anything since the 1 week of July.

## 2014-11-18 NOTE — Telephone Encounter (Signed)
zpak Prednisone 10 mg tabs  Take 2 tabs daily with food x 5ds, then 1 tab daily with food x 5ds then STOP OV if no better

## 2014-11-18 NOTE — Telephone Encounter (Signed)
Spoke with pt in the lobby along with his wife. States that he would like an antibiotic and prednisone taper sent in. Reports increased coughing with lots of throat clearing, SOB, chest tightness x4 days. Denies wheezing or fever. Saw TP a few months ago about the same thing and was given Zpack and Prednisone taper. Advised pt that I was unsure when RA would respond to this message due to him seeing pt's. Pt and wife have gone home and would like to be contact by phone on their home # once this message is addressed. [facility] [address]  RA - please advise. Thanks.

## 2014-11-18 NOTE — Telephone Encounter (Signed)
Problem earlier this month of muscle aches, CWP. Suspicion of Crestor causing this. Per our last instructions, patient had been advised to remain on crestor (he is taking the generic form), if muscle pains continue, to discontinue and give [place] a call.  Pt called today. Apparently pains resolved somewhat - asked how long he has been off Crestor. States "about a week I reckon" - still having pains intermittently but notes more severe w/ Crestor.  Advised to remain off Crestor - continue other meds. Go to ER if CP worse/shortness of breath. Informed pt I will defer to Dr. [name] for further recommendations. He voiced understanding.

## 2014-11-19 NOTE — Telephone Encounter (Signed)
Instructions reviewed w/ patient's wife, she expressed understanding. Answered questions to her satisfaction.

## 2014-11-19 NOTE — Telephone Encounter (Signed)
Ok to stay off crestor; re-check lipids in 6 weeks

## 2014-12-17 ENCOUNTER — Other Ambulatory Visit: Payer: Self-pay | Admitting: Cardiovascular Disease

## 2014-12-17 NOTE — Telephone Encounter (Signed)
Rx(s) sent to pharmacy electronically.  

## 2014-12-21 ENCOUNTER — Telehealth: Payer: Self-pay | Admitting: Cardiovascular Disease

## 2014-12-21 ENCOUNTER — Other Ambulatory Visit: Payer: Self-pay | Admitting: *Deleted

## 2014-12-21 MED ORDER — CRESTOR 10 MG PO TABS: 10.0000 mg | ORAL_TABLET | Freq: Every day | ORAL | Status: DC

## 2014-12-21 NOTE — Telephone Encounter (Signed)
Returned call to patient's wife she stated husband needed crestor refill sent to [facility] for brand name only.Stated generic crestor cost too much.Brand name only crestor refill sent to to [facility].

## 2014-12-21 NOTE — Telephone Encounter (Signed)
Called and spoke with wife informing her the last phone message says to discontinue the crestor and had lipids checked. She proceeds to tell me the patient had no problems while taking the brand crestor. His symptoms started with the generic. She requests for me to get a PA for the brand Crestor. Informed her that  I will try to get the PA for the brand.

## 2014-12-21 NOTE — Telephone Encounter (Signed)
Pt's wife called in stating that she needed for Dr. Landry Dyke nurse about faxing a note to [facility] about doctor approving the pt to taking Crestor instead of the generic. Please call and f/u with the pt  Thanks

## 2014-12-21 NOTE — Telephone Encounter (Signed)
Pt's wife called in stating that [facility] contacted them stating that a prior authorization for the Crestor needed to be sent in. She gave a number for the nurse to call, [phone].   Thanks

## 2014-12-21 NOTE — Addendum Note (Signed)
Addended by: Meda Klinefelter D on: 12/21/2014 09:01 AM   Modules accepted: Orders, Medications

## 2014-12-22 NOTE — Telephone Encounter (Signed)
Can this encounter be closed?

## 2014-12-24 ENCOUNTER — Telehealth: Payer: Self-pay | Admitting: *Deleted

## 2014-12-24 NOTE — Telephone Encounter (Signed)
Checked on status for crestor PA ( DAW) through [facility]. Still pending.

## 2015-01-02 ENCOUNTER — Other Ambulatory Visit: Payer: Self-pay | Admitting: Pulmonary Disease

## 2015-01-04 NOTE — Telephone Encounter (Signed)
Please advise if refill ok

## 2015-01-05 NOTE — Telephone Encounter (Signed)
Ok x 1 He needs FU appt with TP

## 2015-01-09 ENCOUNTER — Telehealth: Payer: Self-pay | Admitting: Internal Medicine

## 2015-01-09 MED ORDER — ALBUTEROL SULFATE HFA 108 (90 BASE) MCG/ACT IN AERS: 2.0000 | INHALATION_SPRAY | RESPIRATORY_TRACT | Status: DC | PRN

## 2015-01-09 NOTE — Telephone Encounter (Signed)
Requesting proair, not clear he's using symbicort and requesting proair,   Prednisone > go ahead and take  Reviewed last set of instructions and need to do meaningful and accurate med rec before more phone care  Ov w/in 2 weeks with all meds to See Tammy NP or me  proair x one rx

## 2015-01-11 ENCOUNTER — Telehealth: Payer: Self-pay | Admitting: Cardiovascular Disease

## 2015-01-11 NOTE — Telephone Encounter (Signed)
Pt still waiting to hear from you. She said you were suppose to be getting some information for him,about 2 weeks ago.

## 2015-01-13 ENCOUNTER — Other Ambulatory Visit: Payer: Self-pay | Admitting: *Deleted

## 2015-01-13 ENCOUNTER — Telehealth: Payer: Self-pay | Admitting: *Deleted

## 2015-01-13 MED ORDER — CRESTOR 10 MG PO TABS: 10.0000 mg | ORAL_TABLET | Freq: Every day | ORAL | Status: DC

## 2015-01-13 NOTE — Telephone Encounter (Signed)
[facility] and got PA approved for crestor 10 mg (brand)

## 2015-01-13 NOTE — Telephone Encounter (Signed)
PA done and approved for crestor 10 mg ( brand). From [facility]. Tried to call patient's home number. Could not leave a message. States mail box not set up. Left message on wife's cell number to return a call.

## 2015-01-15 ENCOUNTER — Telehealth: Payer: Self-pay | Admitting: Cardiovascular Disease

## 2015-01-15 NOTE — Telephone Encounter (Signed)
Mrs. Alexander [name] is calling because [facility] is telling that a DAW #number is needed and they need a patient accounting code. He needs the Brand name medication . [name] cannot take the generic brand. Please call   Thanks

## 2015-01-15 NOTE — Telephone Encounter (Signed)
Attempted to call patient no voice mail  No voice mail set up to leave message  It looks like he has received authorization from brand name Crestor  See note below:  r Note Status Last Update User Last Update Date/Time    Gaynelle Cage, CMA Signed Gaynelle Cage, [name] 01/13/2015 1:35 PM    Telephone Encounter     PA done and approved for crestor 10 mg ( brand). [facility]. Tried to call patient's home number. Could not leave a message. States mail box not set up. Left message on wife's cell number to return a call

## 2015-01-19 ENCOUNTER — Telehealth: Payer: Self-pay

## 2015-01-19 NOTE — Telephone Encounter (Signed)
Patients wife called and said that they have approval for Crestor but the cost is $500.  [facility] says that it needs the DAW #.    I will call [facility] to find out what they need and what is a DAW #  Person at [facility] not sure what they are asking for.  Is reaching out to others at [facility] to find out specifically.  Alexander [name] was told by her  experienced co-workers at [employer] that there is no DAW number.    Given the number [phone] for possible further assistance  Not able to get through on this line

## 2015-01-20 ENCOUNTER — Telehealth: Payer: Self-pay

## 2015-01-20 MED ORDER — ROSUVASTATIN CALCIUM 10 MG PO TABS: 10.0000 mg | ORAL_TABLET | Freq: Every day | ORAL | Status: DC

## 2015-01-20 NOTE — Telephone Encounter (Signed)
[facility] [address]  Patients wife request.

## 2015-01-21 ENCOUNTER — Telehealth: Payer: Self-pay

## 2015-01-21 NOTE — Telephone Encounter (Signed)
[facility]ed [facility] [address].  RX is coming back at cost of [phone]

## 2015-01-21 NOTE — Telephone Encounter (Signed)
Patient can not afford the $211 for creator each month  Sent message to [employer] who does refill authorization at [facility] to see if she can help patient get brand name medication at better price

## 2015-01-25 ENCOUNTER — Telehealth: Payer: Self-pay | Admitting: Cardiovascular Disease

## 2015-01-25 NOTE — Telephone Encounter (Signed)
Wife calling again,what she needs is prior authorization for his Crestor please.

## 2015-01-25 NOTE — Telephone Encounter (Signed)
Alexander [name] was supposed to have been contacting [name] at [facility] so she could help him get his Crestor. The pharmacist say they have not heard anything. Pt have been without his medicine for 2 months.

## 2015-01-26 NOTE — Telephone Encounter (Signed)
Forward to wanda waddell

## 2015-01-27 NOTE — Telephone Encounter (Signed)
Pt's wife is calling back about the pt's prior authorization for his Crestor medication. Please call  Thanks

## 2015-01-28 NOTE — Telephone Encounter (Signed)
Pt's wife called in wanting to speak with a nurse about her husband's Crestor. She is frustrated because he is out of this medication and is in needs of this.

## 2015-02-01 ENCOUNTER — Other Ambulatory Visit: Payer: Self-pay | Admitting: *Deleted

## 2015-02-01 ENCOUNTER — Telehealth: Payer: Self-pay | Admitting: *Deleted

## 2015-02-01 MED ORDER — ROSUVASTATIN CALCIUM 10 MG PO TABS: 10.0000 mg | ORAL_TABLET | Freq: Every day | ORAL | Status: DC

## 2015-02-01 NOTE — Telephone Encounter (Signed)
Patient came into office. Informed her that I just got off of the phone with [facility]. Was on the phone with them for 51 minutes.  Information that I received from [facility] was given to patient's wife. She voiced her understanding but had some additional questions about why the cost of the crestor is different for her than her husband considering they are on the same plan. I told her that she will need to contact them to get that information. The Benefits phone # that I had was given to her to contact them with her questions.

## 2015-02-01 NOTE — Telephone Encounter (Signed)
Called [facility] to try to get a "tier exception for the patient's Crestor 10 mg tablets. Spoke with Deneen in benefits. She explains to me that she will have to do a "review" to try to get the penalties removed from the patient since he is using brand instead of generic. She says that he is being penalized for  #1 getting brand, and # 2 because he is using local pharmacy instead of mail order. The review was done and she informed me that the patient can use Brand Crestor.(which a PA has already previously been done. The review was something totally different)  This will be dated 01/11/15 through 02/01/16. The out of pocket price for the patient if [facility] uses [facility] will be $150/mo. If he uses mail order his out of pocket price will be $188/90 day supply. The patient's wife has been notified of this and she still has some cost questions. I told her she will need to contact her insurance company or [facility]. We have done everything on our end to get this medication approved. Phone numbers were provided to her to contact them.

## 2015-02-16 ENCOUNTER — Telehealth: Payer: Self-pay | Admitting: Internal Medicine

## 2015-02-16 NOTE — Telephone Encounter (Signed)
Please advise. Last OV 3/16

## 2015-02-16 NOTE — Telephone Encounter (Signed)
Pt is having hard time breathing with his COPD and is requesting a prescription for prednisone from Dr. [facility] [address]

## 2015-02-16 NOTE — Telephone Encounter (Signed)
Office visit would be needed for prescription of steroids.  He should follow-up with his PCP,Dr Felicity CoyerLeschber or his pulmonologist, Dr. Vassie LollAlva

## 2015-02-17 NOTE — Telephone Encounter (Signed)
LVM for pt to call back as soon as possible.   RE: pt needs an appt for prednisone refill.

## 2015-02-22 ENCOUNTER — Encounter: Payer: Self-pay | Admitting: Adult Health

## 2015-02-22 ENCOUNTER — Ambulatory Visit (INDEPENDENT_AMBULATORY_CARE_PROVIDER_SITE_OTHER): Payer: Medicare Other | Admitting: Adult Health

## 2015-02-22 VITALS — BP 132/82 | HR 81 | Temp 97.9°F | Ht 67.0 in | Wt 187.0 lb

## 2015-02-22 DIAGNOSIS — J441 Chronic obstructive pulmonary disease with (acute) exacerbation: Secondary | ICD-10-CM

## 2015-02-22 MED ORDER — IPRATROPIUM BROMIDE 0.02 % IN SOLN: 0.5000 mg | Freq: Three times a day (TID) | RESPIRATORY_TRACT | Status: DC | PRN

## 2015-02-22 MED ORDER — AZITHROMYCIN 250 MG PO TABS: ORAL_TABLET | ORAL | Status: AC

## 2015-02-22 MED ORDER — PREDNISONE 10 MG PO TABS: ORAL_TABLET | ORAL | Status: DC

## 2015-02-22 NOTE — Addendum Note (Signed)
Addended by: Karalee HeightOX, Jemya Depierro P on: 02/22/2015 10:05 AM   Modules accepted: Orders

## 2015-02-22 NOTE — Progress Notes (Signed)
  Subjective:    Patient ID: Alexander MorelHarry L Calise, male    DOB: 1933-02-11, 81 y.o.   MRN: [id number]  HPI  81/M, ex smoker for FU of gold C COPD & dyspnea   TESTS Cardiac evaluation - echo nml Stress test - peak VO2 68%, limited by ventilation c. cath [name] -11/10 - nml LV fn, nml coronaries  Spirometry >>FEV1 1.14 - 45% -moderate airway obstruction  02/22/2015 Acute OV : COPD She presents for a one-week history of increased cough with thick mucus, wheezing, shortness of breath and congestion. Denies fever, chest pain, orthopnea, PND or leg swelling Says he has not been taken his Symbicort because it is too expensive. We did give him some samples to help cost. He also has not taken his Atrovent nebulizers. Refills were sent to the pharmacy. He discussed that if his prescriptions continued to be expensive for Symbicort, and we can consider changing him over to nebulizers if needed. Wife will call us back. Last chest x-ray was in April this year with no acute process noted.   Review of Systems neg for any significant sore throat, dysphagia, itching, sneezing, nasal congestion or excess/ purulent secretions, fever, chills, sweats, unintended wt loss, pleuritic or exertional cp, hempoptysis, orthopnea pnd or change in chronic leg swelling. Also denies presyncope, palpitations, heartburn, abdominal pain, nausea, vomiting, diarrhea or change in bowel or urinary habits, dysuria,hematuria, rash, arthralgias, visual complaints, headache, numbness weakness or ataxia.     Objective:   Physical Exam  Gen. Pleasant, elderly in no distress ENT - no lesions, no post nasal drip Neck: No JVD, no thyromegaly, no carotid bruits Lungs: no use of accessory muscles, no dullness to percussion , few trace wheezes  Cardiovascular: Rhythm regular, heart sounds  normal, no murmurs or gallops, no peripheral edema Musculoskeletal: No deformities, no cyanosis or clubbing         Assessment & Plan:

## 2015-02-22 NOTE — Assessment & Plan Note (Signed)
Exacerbation  Plan Zpack take as directed.  Prednisone taper over next week .  Continue on Symbicort  Restart Ipratropium Neb Three times a day  .  Please contact office for sooner follow up if symptoms do not improve or worsen or seek emergency care  Follow up Dr. Vassie LollAlva  In 2-3 months and As needed

## 2015-02-22 NOTE — Patient Instructions (Signed)
Zpack take as directed.  Prednisone taper over next week .  Continue on Symbicort  Restart Ipratropium Neb Three times a day  .  Please contact office for sooner follow up if symptoms do not improve or worsen or seek emergency care  Follow up Dr. Vassie LollAlva  In 2-3 months and As needed

## 2015-03-03 NOTE — Progress Notes (Signed)
Reviewed & agree with plan  

## 2015-03-23 ENCOUNTER — Telehealth: Payer: Self-pay | Admitting: Adult Health

## 2015-03-23 MED ORDER — METHYLPREDNISOLONE 4 MG PO TBPK: ORAL_TABLET | ORAL | Status: DC

## 2015-03-23 MED ORDER — AMOXICILLIN-POT CLAVULANATE 875-125 MG PO TABS: 1.0000 | ORAL_TABLET | Freq: Two times a day (BID) | ORAL | Status: DC

## 2015-03-23 NOTE — Telephone Encounter (Signed)
Called and spoke to pt. Pt is c/o increase in prod cough with green mucus, increase in SOB, and chest congestion x 2-3 days. Pt denies CP/tightness, f/c/s, and hemoptysis. Will send to doc of day as RA is unavailable.   Dr. Kriste BasqueNadel please advise. Thanks.   No Known Allergies  Current Outpatient Prescriptions on File Prior to Visit  Medication Sig Dispense Refill  . albuterol (PROAIR HFA) 108 (90 BASE) MCG/ACT inhaler Inhale 2 puffs into the lungs every 4 (four) hours as needed for wheezing or shortness of breath. 1 Inhaler 0  . albuterol (PROVENTIL) (2.5 MG/3ML) 0.083% nebulizer solution Take 3 mLs (2.5 mg total) by nebulization every 6 (six) hours as needed for wheezing or shortness of breath. 75 mL 12  . budesonide-formoterol (SYMBICORT) 160-4.5 MCG/ACT inhaler Inhale 2 puffs into the lungs 2 (two) times daily. 1 Inhaler 5  . docusate sodium (COLACE) 50 MG capsule Take 50 mg by mouth daily.    . famotidine (PEPCID) 40 MG tablet Take 40 mg by mouth daily.    . hydrochlorothiazide (MICROZIDE) 12.5 MG capsule Take 1 capsule (12.5 mg total) by mouth every other day. 45 capsule 3  . ILEVRO 0.3 % ophthalmic suspension Apply 1 drop to eye at bedtime.  1  . ipratropium (ATROVENT) 0.02 % nebulizer solution Take 2.5 mLs (0.5 mg total) by nebulization 3 (three) times daily as needed for wheezing or shortness of breath. Dx 496 120 mL 5  . LINZESS 145 MCG CAPS capsule Take 1 tablet by mouth daily before breakfast.  10  . losartan (COZAAR) 100 MG tablet TAKE 1 TABLET (100 MG TOTAL) BY MOUTH DAILY. 30 tablet 11  . metoprolol succinate (TOPROL-XL) 50 MG 24 hr tablet TAKE 1 TABLET (50 MG TOTAL) BY MOUTH DAILY. 30 tablet 9  . pantoprazole (PROTONIX) 40 MG tablet Take 1 tablet by mouth daily.    . rosuvastatin (CRESTOR) 10 MG tablet Take 1 tablet (10 mg total) by mouth daily. 30 tablet 3  . triamcinolone ointment (KENALOG) 0.5 % Apply 1 application topically 2 (two) times daily. 15 g 1  . VIAGRA 100 MG tablet  TAKE 0.5-1 TABLETS (50-100 MG TOTAL) BY MOUTH DAILY AS NEEDED FOR ERECTILE DYSFUNCTION. 6 tablet 2   No current facility-administered medications on file prior to visit.

## 2015-03-23 NOTE — Telephone Encounter (Signed)
Per SN>> Call in Augmentin 875mg  #14 with instruction to take 1 po BID and medrol dose pack to pt's pharmacy. Also advise pt to take OTC Align and Mucinex to help with symptoms  Called and spoke with pt's wife and informed of rec and medication instructions Wife voiced understanding Orders sent electronically to pt's pharmacy  Nothing further is needed

## 2015-04-08 ENCOUNTER — Emergency Department (HOSPITAL_COMMUNITY): Payer: Medicare Other

## 2015-04-08 ENCOUNTER — Encounter (HOSPITAL_COMMUNITY): Payer: Self-pay

## 2015-04-08 ENCOUNTER — Emergency Department (HOSPITAL_COMMUNITY)
Admission: EM | Admit: 2015-04-08 | Discharge: 2015-04-08 | Disposition: A | Discharge status: Home / Self Care | Payer: Medicare Other | Attending: Emergency Medicine | Admitting: Emergency Medicine

## 2015-04-08 ENCOUNTER — Telehealth: Payer: Self-pay | Admitting: Cardiovascular Disease

## 2015-04-08 DIAGNOSIS — R0789 Other chest pain: Secondary | ICD-10-CM | POA: Diagnosis not present

## 2015-04-08 DIAGNOSIS — Z791 Long term (current) use of non-steroidal anti-inflammatories (NSAID): Secondary | ICD-10-CM | POA: Diagnosis not present

## 2015-04-08 DIAGNOSIS — R0602 Shortness of breath: Secondary | ICD-10-CM | POA: Diagnosis present

## 2015-04-08 DIAGNOSIS — Z7982 Long term (current) use of aspirin: Secondary | ICD-10-CM | POA: Diagnosis not present

## 2015-04-08 DIAGNOSIS — J441 Chronic obstructive pulmonary disease with (acute) exacerbation: Secondary | ICD-10-CM | POA: Insufficient documentation

## 2015-04-08 DIAGNOSIS — Z79899 Other long term (current) drug therapy: Secondary | ICD-10-CM | POA: Diagnosis not present

## 2015-04-08 DIAGNOSIS — I10 Essential (primary) hypertension: Secondary | ICD-10-CM | POA: Diagnosis not present

## 2015-04-08 DIAGNOSIS — Z7952 Long term (current) use of systemic steroids: Secondary | ICD-10-CM | POA: Insufficient documentation

## 2015-04-08 DIAGNOSIS — K5900 Constipation, unspecified: Secondary | ICD-10-CM | POA: Insufficient documentation

## 2015-04-08 DIAGNOSIS — Z9889 Other specified postprocedural states: Secondary | ICD-10-CM | POA: Insufficient documentation

## 2015-04-08 DIAGNOSIS — Z8669 Personal history of other diseases of the nervous system and sense organs: Secondary | ICD-10-CM | POA: Insufficient documentation

## 2015-04-08 DIAGNOSIS — E785 Hyperlipidemia, unspecified: Secondary | ICD-10-CM | POA: Insufficient documentation

## 2015-04-08 DIAGNOSIS — Z87891 Personal history of nicotine dependence: Secondary | ICD-10-CM | POA: Diagnosis not present

## 2015-04-08 DIAGNOSIS — Z7951 Long term (current) use of inhaled steroids: Secondary | ICD-10-CM | POA: Insufficient documentation

## 2015-04-08 LAB — BASIC METABOLIC PANEL
Anion gap: 8 (ref 5–15)
BUN: 12 mg/dL (ref 6–20)
CALCIUM: 9.4 mg/dL (ref 8.9–10.3)
CO2: 30 mmol/L (ref 22–32)
CREATININE: 1.27 mg/dL — AB (ref 0.61–1.24)
Chloride: 101 mmol/L (ref 101–111)
GFR calc Af Amer: 60 mL/min — ABNORMAL LOW (ref >60)
GFR, EST NON AFRICAN AMERICAN: 52 mL/min — AB (ref >60)
Glucose, Bld: 100 mg/dL — ABNORMAL HIGH (ref 65–99)
Potassium: 4.6 mmol/L (ref 3.5–5.1)
SODIUM: 139 mmol/L (ref 135–145)

## 2015-04-08 LAB — CBC
HCT: 39.7 % (ref 39.0–52.0)
Hemoglobin: 12.8 g/dL — ABNORMAL LOW (ref 13.0–17.0)
MCH: 30.5 pg (ref 26.0–34.0)
MCHC: 32.2 g/dL (ref 30.0–36.0)
MCV: 94.7 fL (ref 78.0–100.0)
PLATELETS: 283 10*3/uL (ref 150–400)
RBC: 4.19 MIL/uL — ABNORMAL LOW (ref 4.22–5.81)
RDW: 12.5 % (ref 11.5–15.5)
WBC: 4.7 10*3/uL (ref 4.0–10.5)

## 2015-04-08 LAB — I-STAT TROPONIN, ED
TROPONIN I, POC: 0 ng/mL (ref 0.00–0.08)
Troponin i, poc: 0.01 ng/mL (ref 0.00–0.08)

## 2015-04-08 LAB — BRAIN NATRIURETIC PEPTIDE: B Natriuretic Peptide: 30.2 pg/mL (ref 0.0–100.0)

## 2015-04-08 MED ORDER — ALBUTEROL SULFATE (2.5 MG/3ML) 0.083% IN NEBU
5.0000 mg | INHALATION_SOLUTION | Freq: Once | RESPIRATORY_TRACT | Status: AC
  Administered 2015-04-08: 5 mg via RESPIRATORY_TRACT
  Filled 2015-04-08: qty 6

## 2015-04-08 MED ORDER — ASPIRIN 81 MG PO CHEW
324.0000 mg | CHEWABLE_TABLET | Freq: Once | ORAL | Status: AC
  Administered 2015-04-08: 324 mg via ORAL
  Filled 2015-04-08: qty 4

## 2015-04-08 MED ORDER — PREDNISONE 20 MG PO TABS: 60.0000 mg | ORAL_TABLET | Freq: Every day | ORAL | Status: DC

## 2015-04-08 MED ORDER — ASPIRIN 81 MG PO CHEW: 81.0000 mg | CHEWABLE_TABLET | Freq: Every day | ORAL | Status: DC

## 2015-04-08 MED ORDER — BENZONATATE 100 MG PO CAPS: 100.0000 mg | ORAL_CAPSULE | Freq: Three times a day (TID) | ORAL | Status: DC

## 2015-04-08 MED ORDER — METHYLPREDNISOLONE SODIUM SUCC 125 MG IJ SOLR
125.0000 mg | Freq: Once | INTRAMUSCULAR | Status: AC
  Administered 2015-04-08: 125 mg via INTRAVENOUS
  Filled 2015-04-08: qty 2

## 2015-04-08 NOTE — Telephone Encounter (Signed)
Pt having chest pressure that started yesterday afternoon. Pt woke up this morning and pain was worse. Pain starting and stopping this morning.  Pt requested that wife take him to the hospital. Wife called our office to find out if this is best for her to do.  Told her to listen to pt if he feels bad enough he needs to go hospital, she should call 911 and have him go to the hospital to be evaluated.  She verbalized understanding, no additional questions at this time.
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
## 2015-04-08 NOTE — ED Notes (Signed)
Pt with chest pain and shortness of breath last night and this morning.  Hx of COPD. Non radiating.  No fever.  Pt present with cough

## 2015-04-08 NOTE — Discharge Instructions (Signed)
Call Dr. Landry DykeKelly's office to arrange appointment for outpatient visit. Return immediately for worsening pain, fever, shortness of breath or any concerns.  Nonspecific Chest Pain It is often hard to find the cause of chest pain. There is always a chance that your pain could be related to something serious, such as a heart attack or a blood clot in your lungs. Chest pain can also be caused by conditions that are not life-threatening. If you have chest pain, it is very important to follow up with your doctor.  HOME CARE  If you were prescribed an antibiotic medicine, finish it all even if you start to feel better.  Avoid any activities that cause chest pain.  Do not use any tobacco products, including cigarettes, chewing tobacco, or electronic cigarettes. If you need help quitting, ask your doctor.  Do not drink alcohol.  Take medicines only as told by your doctor.  Keep all follow-up visits as told by your doctor. This is important. This includes any further testing if your chest pain does not go away.  Your doctor may tell you to keep your head raised (elevated) while you sleep.  Make lifestyle changes as told by your doctor. These may include:  Getting regular exercise. Ask your doctor to suggest some activities that are safe for you.  Eating a heart-healthy diet. Your doctor or a diet specialist (dietitian) can help you to learn healthy eating options.  Maintaining a healthy weight.  Managing diabetes, if necessary.  Reducing stress. GET HELP IF:  Your chest pain does not go away, even after treatment.  You have a rash with blisters on your chest.  You have a fever. GET HELP RIGHT AWAY IF:  Your chest pain is worse.  You have an increasing cough, or you cough up blood.  You have severe belly (abdominal) pain.  You feel extremely weak.  You pass out (faint).  You have chills.  You have sudden, unexplained chest discomfort.  You have sudden, unexplained discomfort  in your arms, back, neck, or jaw.  You have shortness of breath at any time.  You suddenly start to sweat, or your skin gets clammy.  You feel nauseous.  You vomit.  You suddenly feel light-headed or dizzy.  Your heart begins to beat quickly, or it feels like it is skipping beats. These symptoms may be an emergency. Do not wait to see if the symptoms will go away. Get medical help right away. Call your local emergency services (911 in the [place] Do not drive yourself to the hospital.   This information is not intended to replace advice given to you by your health care provider. Make sure you discuss any questions you have with your health care provider.   Document Released: 09/27/2007 Document Revised: 05/01/2014 Document Reviewed: 11/14/2013 Elsevier Interactive Patient Education 2016 [employer]  Chronic Obstructive Pulmonary Disease Exacerbation Chronic obstructive pulmonary disease (COPD) is a common lung problem. In COPD, the flow of air from the lungs is limited. COPD exacerbations are times that breathing gets worse and you need extra treatment. Without treatment they can be life threatening. If they happen often, your lungs can become more damaged. If your COPD gets worse, your doctor may treat you with:  Medicines.  Oxygen.  Different ways to clear your airway, such as using a mask. HOME CARE  Do not smoke.  Avoid tobacco smoke and other things that bother your lungs.  If given, take your antibiotic medicine as told. Finish the medicine even  if you start to feel better.  Only take medicines as told by your doctor.  Drink enough fluids to keep your pee (urine) clear or pale yellow (unless your doctor has told you not to).  Use a cool mist machine (vaporizer).  If you use oxygen or a machine that turns liquid medicine into a mist (nebulizer), continue to use them as told.  Keep up with shots (vaccinations) as told by your doctor.  Exercise regularly.  Eat  healthy foods.  Keep all doctor visits as told. GET HELP RIGHT AWAY IF:  You are very short of breath and it gets worse.  You have trouble talking.  You have bad chest pain.  You have blood in your spit (sputum).  You have a fever.  You keep throwing up (vomiting).  You feel weak, or you pass out (faint).  You feel confused.  You keep getting worse. MAKE SURE YOU:  Understand these instructions.  Will watch your condition.  Will get help right away if you are not doing well or get worse.   This information is not intended to replace advice given to you by your health care provider. Make sure you discuss any questions you have with your health care provider.   Document Released: 03/30/2011 Document Revised: 05/01/2014 Document Reviewed: 12/13/2012 Elsevier Interactive Patient Education [employer]

## 2015-04-10 ENCOUNTER — Ambulatory Visit (INDEPENDENT_AMBULATORY_CARE_PROVIDER_SITE_OTHER): Payer: Medicare Other | Admitting: Family Medicine

## 2015-04-10 VITALS — BP 124/86 | HR 77 | Temp 98.5°F | Resp 20 | Ht 67.0 in | Wt 183.8 lb

## 2015-04-10 DIAGNOSIS — L089 Local infection of the skin and subcutaneous tissue, unspecified: Secondary | ICD-10-CM | POA: Diagnosis not present

## 2015-04-10 DIAGNOSIS — L98491 Non-pressure chronic ulcer of skin of other sites limited to breakdown of skin: Secondary | ICD-10-CM

## 2015-04-10 DIAGNOSIS — M79642 Pain in left hand: Secondary | ICD-10-CM | POA: Diagnosis not present

## 2015-04-10 MED ORDER — DOXYCYCLINE HYCLATE 100 MG PO TABS: 100.0000 mg | ORAL_TABLET | Freq: Two times a day (BID) | ORAL | Status: DC

## 2015-04-10 NOTE — Progress Notes (Signed)
Patient ID: Alexander MorelHarry L [name], male    DOB: 1932/07/05  Age: 81 y.o. MRN: [id number]  Chief Complaint  Patient presents with  . Hand Pain    right hand swelling and pain     Subjective:   Patient is 81 years old, has a skin lesion on the lateral aspect of his right hand about 3 cm distal to the wrist where there is a callused area that is been there for many years. Over the last week it began hurting him. This concerned him.  Current allergies, medications, problem list, past/family and social histories reviewed.  Objective:  BP 124/86 mmHg  Pulse 77  Temp(Src) 98.5 F (36.9 C) (Oral)  Resp 20  Ht 5\' 7"  (1.702 m)  Wt 183 lb 12.8 oz (83.371 kg)  BMI 28.78 kg/m2  SpO2 95%  At the crusted callus like lesion on the lateral aspect of the right hand distal to the wrist as noted. This is dry and looks to have a little crack in the surface where there is a small ulceration. A tiny moisture could be expressed in the crack, suggestive of infection in the tissues. No erythema or ascending erythema. He has had some right axillary pain but no nodes could be palpated there.  Assessment & Plan:   Assessment: 1. Infection of hand   2. Pain of left hand   3. Callous ulcer, limited to breakdown of skin (HCC)       Plan: I believe this is a chronic crusted callus that has gotten secondarily infected. Will treat with an antibiotic. If symptoms continue to persist may need excision and/or biopsy of the tissues.  Will treat with doxycycline. If it continues to persist probably would want to do a biopsy  Orders Placed This Encounter  Procedures  . Wound culture    Meds ordered this encounter  Medications  . doxycycline (VIBRA-TABS) 100 MG tablet    Sig: Take 1 tablet (100 mg total) by mouth 2 (two) times daily.    Dispense:  14 tablet    Refill:  0         Patient Instructions  Take doxycycline 1 pill twice daily for infection  If the skin lesion continues to remain painful  after the course of treatment has been completed, please return.  If he should get worse at any time return sooner.     Return if symptoms worsen or fail to improve.   Allia Wiltsey, MD 04/10/2015

## 2015-04-10 NOTE — Patient Instructions (Signed)
Take doxycycline 1 pill twice daily for infection  If the skin lesion continues to remain painful after the course of treatment has been completed, please return.  If he should get worse at any time return sooner.

## 2015-04-14 LAB — WOUND CULTURE
Gram Stain: NONE SEEN
Gram Stain: NONE SEEN
Gram Stain: NONE SEEN
Organism ID, Bacteria: NO GROWTH

## 2015-05-06 ENCOUNTER — Other Ambulatory Visit (INDEPENDENT_AMBULATORY_CARE_PROVIDER_SITE_OTHER): Payer: Medicare Other

## 2015-05-06 ENCOUNTER — Ambulatory Visit (INDEPENDENT_AMBULATORY_CARE_PROVIDER_SITE_OTHER): Payer: Medicare Other | Admitting: Internal Medicine

## 2015-05-06 ENCOUNTER — Encounter: Payer: Self-pay | Admitting: Internal Medicine

## 2015-05-06 VITALS — BP 116/70 | HR 100 | Temp 97.9°F | Ht 67.0 in | Wt 182.0 lb

## 2015-05-06 DIAGNOSIS — Z0000 Encounter for general adult medical examination without abnormal findings: Secondary | ICD-10-CM

## 2015-05-06 DIAGNOSIS — L989 Disorder of the skin and subcutaneous tissue, unspecified: Secondary | ICD-10-CM | POA: Diagnosis not present

## 2015-05-06 DIAGNOSIS — F0390 Unspecified dementia without behavioral disturbance: Secondary | ICD-10-CM

## 2015-05-06 DIAGNOSIS — B370 Candidal stomatitis: Secondary | ICD-10-CM

## 2015-05-06 DIAGNOSIS — Z23 Encounter for immunization: Secondary | ICD-10-CM | POA: Diagnosis not present

## 2015-05-06 DIAGNOSIS — F03A0 Unspecified dementia, mild, without behavioral disturbance, psychotic disturbance, mood disturbance, and anxiety: Secondary | ICD-10-CM

## 2015-05-06 DIAGNOSIS — Z125 Encounter for screening for malignant neoplasm of prostate: Secondary | ICD-10-CM | POA: Insufficient documentation

## 2015-05-06 DIAGNOSIS — J441 Chronic obstructive pulmonary disease with (acute) exacerbation: Secondary | ICD-10-CM

## 2015-05-06 DIAGNOSIS — E785 Hyperlipidemia, unspecified: Secondary | ICD-10-CM | POA: Diagnosis not present

## 2015-05-06 LAB — CBC WITH DIFFERENTIAL/PLATELET
BASOS PCT: 0.7 % (ref 0.0–3.0)
Basophils Absolute: 0.1 10*3/uL (ref 0.0–0.1)
EOS PCT: 4.2 % (ref 0.0–5.0)
Eosinophils Absolute: 0.3 10*3/uL (ref 0.0–0.7)
HEMATOCRIT: 40.3 % (ref 39.0–52.0)
HEMOGLOBIN: 13.3 g/dL (ref 13.0–17.0)
LYMPHS PCT: 22.4 % (ref 12.0–46.0)
Lymphs Abs: 1.8 10*3/uL (ref 0.7–4.0)
MCHC: 33 g/dL (ref 30.0–36.0)
MCV: 92.8 fl (ref 78.0–100.0)
MONOS PCT: 9.3 % (ref 3.0–12.0)
Monocytes Absolute: 0.7 10*3/uL (ref 0.1–1.0)
Neutro Abs: 5 10*3/uL (ref 1.4–7.7)
Neutrophils Relative %: 63.4 % (ref 43.0–77.0)
Platelets: 320 10*3/uL (ref 150.0–400.0)
RBC: 4.35 Mil/uL (ref 4.22–5.81)
RDW: 13.1 % (ref 11.5–15.5)
WBC: 7.8 10*3/uL (ref 4.0–10.5)

## 2015-05-06 LAB — HEPATIC FUNCTION PANEL
ALT: 18 U/L (ref 0–53)
AST: 19 U/L (ref 0–37)
Albumin: 4.3 g/dL (ref 3.5–5.2)
Alkaline Phosphatase: 65 U/L (ref 39–117)
BILIRUBIN TOTAL: 0.4 mg/dL (ref 0.2–1.2)
Bilirubin, Direct: 0.1 mg/dL (ref 0.0–0.3)
Total Protein: 6.8 g/dL (ref 6.0–8.3)

## 2015-05-06 LAB — BASIC METABOLIC PANEL
BUN: 15 mg/dL (ref 6–23)
CO2: 34 mEq/L — ABNORMAL HIGH (ref 19–32)
CREATININE: 1.24 mg/dL (ref 0.40–1.50)
Calcium: 10 mg/dL (ref 8.4–10.5)
Chloride: 102 mEq/L (ref 96–112)
GFR: 72.11 mL/min (ref >60.00)
Glucose, Bld: 102 mg/dL — ABNORMAL HIGH (ref 70–99)
POTASSIUM: 4.2 meq/L (ref 3.5–5.1)
Sodium: 140 mEq/L (ref 135–145)

## 2015-05-06 LAB — URINALYSIS, ROUTINE W REFLEX MICROSCOPIC
BILIRUBIN URINE: NEGATIVE
Hgb urine dipstick: NEGATIVE
KETONES UR: NEGATIVE
Leukocytes, UA: NEGATIVE
NITRITE: NEGATIVE
PH: 5.5 (ref 5.0–8.0)
RBC / HPF: NONE SEEN (ref 0–?)
SPECIFIC GRAVITY, URINE: 1.02 (ref 1.000–1.030)
Total Protein, Urine: NEGATIVE
URINE GLUCOSE: NEGATIVE
Urobilinogen, UA: 0.2 (ref 0.0–1.0)
WBC UA: NONE SEEN (ref 0–?)

## 2015-05-06 LAB — LIPID PANEL
CHOL/HDL RATIO: 3
Cholesterol: 172 mg/dL (ref 0–200)
HDL: 54.5 mg/dL (ref >39.00)
LDL CALC: 86 mg/dL (ref 0–99)
NonHDL: 117.31
TRIGLYCERIDES: 158 mg/dL — AB (ref 0.0–149.0)
VLDL: 31.6 mg/dL (ref 0.0–40.0)

## 2015-05-06 LAB — TSH: TSH: 1.19 u[IU]/mL (ref 0.35–4.50)

## 2015-05-06 MED ORDER — NYSTATIN 100000 UNIT/ML MT SUSP: 500000.0000 [IU] | Freq: Four times a day (QID) | OROMUCOSAL | Status: DC

## 2015-05-06 NOTE — Progress Notes (Signed)
Pre visit review using our clinic review tool, if applicable. No additional management support is needed unless otherwise documented below in the visit note. 

## 2015-05-06 NOTE — Assessment & Plan Note (Signed)
stable overall by history and exam, recent data reviewed with pt, and pt to continue medical treatment as before,  to f/u any worsening symptoms or concerns SpO2 Readings from Last 3 Encounters:  05/06/15 96%  04/10/15 95%  04/08/15 99%

## 2015-05-06 NOTE — Addendum Note (Signed)
Addended by: Anselm JunglingBONYUN-DEBOURGH, Laporchia Nakajima M on: 05/06/2015 05:07 PM   Modules accepted: Orders

## 2015-05-06 NOTE — Patient Instructions (Addendum)
You had the tetanus (Td) shot today  Please take all new medication as prescribed  - the mouth solution  Please continue all other medications as before, and refills have been done if requested.  Please have the pharmacy call with any other refills you may need.  Please continue your efforts at being more active, low cholesterol diet, and weight control.  You are otherwise up to date with prevention measures today.  You will be contacted regarding the referral for: dermatology  Please keep your appointments with your specialists as you may have planned  Please go to the LAB in the Basement (turn left off the elevator) for the tests to be done today  You will be contacted by phone if any changes need to be made immediately.  Otherwise, you will receive a letter about your results with an explanation, but please check with MyChart first.  Please remember to sign up for MyChart if you have not done so, as this will be important to you in the future with finding out test results, communicating by private email, and scheduling acute appointments online when needed.   Please return in 6 months, or sooner if needed

## 2015-05-06 NOTE — Assessment & Plan Note (Signed)
?   Mild worsening, delcines further eval or tx at this time

## 2015-05-06 NOTE — Assessment & Plan Note (Signed)
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
## 2015-05-06 NOTE — Assessment & Plan Note (Signed)
Ok for oral soln nystatin asd,  to f/u any worsening symptoms or concerns

## 2015-05-07 ENCOUNTER — Encounter: Payer: Self-pay | Admitting: Internal Medicine

## 2015-05-15 ENCOUNTER — Ambulatory Visit (INDEPENDENT_AMBULATORY_CARE_PROVIDER_SITE_OTHER): Payer: Medicare Other | Admitting: Family Medicine

## 2015-05-15 ENCOUNTER — Encounter: Payer: Self-pay | Admitting: Family Medicine

## 2015-05-15 VITALS — BP 126/70 | HR 98 | Temp 98.7°F | Resp 20 | Ht 67.0 in | Wt 189.0 lb

## 2015-05-15 DIAGNOSIS — R0789 Other chest pain: Secondary | ICD-10-CM

## 2015-05-15 DIAGNOSIS — K219 Gastro-esophageal reflux disease without esophagitis: Secondary | ICD-10-CM | POA: Diagnosis not present

## 2015-05-15 DIAGNOSIS — K5900 Constipation, unspecified: Secondary | ICD-10-CM

## 2015-05-15 MED ORDER — ROSUVASTATIN CALCIUM 10 MG PO TABS
10.0000 mg | ORAL_TABLET | Freq: Every day | ORAL | Status: DC
Start: 2015-05-15 — End: 2015-05-17

## 2015-05-15 MED ORDER — PANTOPRAZOLE SODIUM 40 MG PO TBEC: 40.0000 mg | DELAYED_RELEASE_TABLET | Freq: Every day | ORAL | Status: DC | PRN

## 2015-05-15 NOTE — Progress Notes (Signed)
Pre visit review using our clinic review tool, if applicable. No additional management support is needed unless otherwise documented below in the visit note. 
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
## 2015-05-15 NOTE — Patient Instructions (Signed)
I sent 2 weeks of refills to the pharmacy on the medications you requested.  Please try mirilax once daily for 1 week for the constipation.  Follow up with your gastroenterologist, primary doctor and cardiologist as needed

## 2015-05-17 ENCOUNTER — Telehealth: Payer: Self-pay | Admitting: Cardiovascular Disease

## 2015-05-17 MED ORDER — ROSUVASTATIN CALCIUM 10 MG PO TABS: 10.0000 mg | ORAL_TABLET | Freq: Every day | ORAL | Status: DC

## 2015-05-17 NOTE — Telephone Encounter (Signed)
°*  STAT* If patient is at the pharmacy, call can be transferred to refill team.   1. Which medications need to be refilled? (please list name of each medication and dose if known) Crestor  2. Which pharmacy/location (including street and city if local pharmacy) is medication to [facility] sent to?[phone]  3. Do they need a 30 day or 90 day supply? 30 and refills

## 2015-05-31 ENCOUNTER — Ambulatory Visit (INDEPENDENT_AMBULATORY_CARE_PROVIDER_SITE_OTHER): Payer: Medicare Other | Admitting: Pulmonary Disease

## 2015-05-31 ENCOUNTER — Encounter: Payer: Self-pay | Admitting: Pulmonary Disease

## 2015-05-31 VITALS — BP 114/74 | HR 102 | Temp 98.2°F | Ht 67.0 in | Wt 188.8 lb

## 2015-05-31 DIAGNOSIS — J449 Chronic obstructive pulmonary disease, unspecified: Secondary | ICD-10-CM

## 2015-05-31 DIAGNOSIS — K219 Gastro-esophageal reflux disease without esophagitis: Secondary | ICD-10-CM | POA: Diagnosis not present

## 2015-05-31 NOTE — Patient Instructions (Signed)
Take mucinex 600 mg twice daily  For cough If persistent gren, call for antibiotic Symbicort 160 samples Take albuterol MDi 2 puffs as needed for wheezing 

## 2015-05-31 NOTE — Progress Notes (Signed)
   Subjective:    Patient ID: Alexander [name], male    DOB: 10-16-1932, 82 y.o.   MRN: [id number]  HPI  81/M, ex smoker for FU of gold C COPD & dyspnea    05/31/2015  Chief Complaint  Patient presents with  . Follow-up    3 month COPD follow up - reports some worsening DOE and fatigue since last ov with continued prod cough with green mucus, wheezing.  denies any fever, tightness, hemoptysis.  requesting refill on Pepcid    C/o int cough - occ green sputum, now clear occ wheeze Sob on carrying groceries C/o left chest pain , cardiac eval neg incl myocardial perfusion imaging, he thinks this is due to generic crestor.  symbicort is expensive   CXR 03/2015 reviewed -ED visit, no infx/ efusion  Significant tests/ events  08/2010 echo nml 09/2012 Stress test - peak VO2 68%, limited by ventilation c. cath [name]) -11/10 - nml LV fn, nml coronaries  09/2012 Spirometry >>FEV1 1.14 - 45% -moderate airway obstruction      Review of Systems neg for any significant sore throat, dysphagia, itching, sneezing, nasal congestion or excess/ purulent secretions, fever, chills, sweats, unintended wt loss, pleuritic or exertional cp, hempoptysis, orthopnea pnd or change in chronic leg swelling. Also denies presyncope, palpitations, heartburn, abdominal pain, nausea, vomiting, diarrhea or change in bowel or urinary habits, dysuria,hematuria, rash, arthralgias, visual complaints, headache, numbness weakness or ataxia.     Objective:   Physical Exam  Gen. Pleasant, obese, in no distress ENT - no lesions, no post nasal drip Neck: No JVD, no thyromegaly, no carotid bruits Lungs: no use of accessory muscles, no dullness to percussion, decreased without rales or rhonchi  Cardiovascular: Rhythm regular, heart sounds  normal, no murmurs or gallops, no peripheral edema Musculoskeletal: No deformities, no cyanosis or clubbing , no tremors       Assessment & Plan:

## 2015-05-31 NOTE — Assessment & Plan Note (Signed)
Take mucinex 600 mg twice daily  For cough If persistent gren, call for antibiotic Symbicort 160 samples Take albuterol MDi 2 puffs as needed for wheezing

## 2015-05-31 NOTE — Assessment & Plan Note (Signed)
protonix + pepcid combination

## 2015-06-09 ENCOUNTER — Other Ambulatory Visit: Payer: Self-pay | Admitting: Gastroenterology

## 2015-06-09 DIAGNOSIS — R110 Nausea: Secondary | ICD-10-CM

## 2015-06-23 ENCOUNTER — Ambulatory Visit (HOSPITAL_COMMUNITY)
Admission: RE | Admit: 2015-06-23 | Discharge: 2015-06-23 | Disposition: A | Discharge status: Home / Self Care | Payer: Medicare Other | Source: Ambulatory Visit | Attending: Gastroenterology | Admitting: Gastroenterology

## 2015-06-23 DIAGNOSIS — R110 Nausea: Secondary | ICD-10-CM | POA: Insufficient documentation

## 2015-06-23 MED ORDER — TECHNETIUM TC 99M SULFUR COLLOID: 2.0000 | Freq: Once | INTRAVENOUS | Status: DC | PRN

## 2015-07-03 ENCOUNTER — Other Ambulatory Visit: Payer: Self-pay | Admitting: Adult Health

## 2015-07-07 ENCOUNTER — Telehealth: Payer: Self-pay | Admitting: Pulmonary Disease

## 2015-07-07 NOTE — Telephone Encounter (Signed)
Spoke with pt's wife. States that Symbicort will need PA. Called [facility] [address] Was on a long hold. Left message for them to fax over PA. Will await PA.

## 2015-07-09 NOTE — Telephone Encounter (Signed)
Called and [facility] [place] [facility] She states she will refax form to the fax number [phone]. Will await fax

## 2015-07-09 NOTE — Telephone Encounter (Signed)
Submitted PA for Symbicort thru [employer]. Key: [id number]  Pt [id number]  Will await response. [facility] (p) [phone]

## 2015-07-12 NOTE — Telephone Encounter (Signed)
I spoke with the pt's spouse and notified that symbicort was denied by ins  I advised she needs to obtain his formulary and let us know what the covered meds are  She verbalized understanding  I have left 2 samples of symbicort to last until then

## 2015-07-12 NOTE — Telephone Encounter (Signed)
Symbicort has been denied by pt's insurance. No alternatives given.

## 2015-07-12 NOTE — Telephone Encounter (Signed)
Determination has not been received yet. Awaiting response.

## 2015-07-12 NOTE — Telephone Encounter (Signed)
Pt is wanting samples of this till we can get it filled [phone]

## 2015-07-12 NOTE — Telephone Encounter (Signed)
[id number]

## 2015-07-15 ENCOUNTER — Telehealth: Payer: Self-pay | Admitting: Pulmonary Disease

## 2015-07-15 NOTE — Telephone Encounter (Signed)
Spoke with pt, states that symbicort is not covered by insurance-wants to know what our next step is.  I advised that we spoke with pt's spouse on 07/12/15 (see last telephone encounter) stating that we would need pt's medication formulary to see what is covered.  Pt and wife are aware of recs, will contact insurance company to get formulary and will call back with alternatives.    Will await call.

## 2015-07-16 NOTE — Telephone Encounter (Signed)
Pt calling to check on status of refill request, please advise.Alexander [name] ° °

## 2015-07-16 NOTE — Telephone Encounter (Signed)
Spoke with the pt's spouse, [name]  She states that she did not have any luck with calling for medications alternatives for Symbicort  She states [facility] called and told her med needs PA  [facility] and spoke [facility]ist  Number to call for PA [phone] Pt ID [phone]   Called and was able to get med approved from 06/16/15 until 07/15/18  Approval number [id number] Spouse and pharmacy aware

## 2015-09-16 ENCOUNTER — Encounter (INDEPENDENT_AMBULATORY_CARE_PROVIDER_SITE_OTHER): Payer: Medicare Other | Admitting: Ophthalmology

## 2015-09-21 ENCOUNTER — Encounter (INDEPENDENT_AMBULATORY_CARE_PROVIDER_SITE_OTHER): Payer: Medicare Other | Admitting: Ophthalmology

## 2015-09-21 DIAGNOSIS — I10 Essential (primary) hypertension: Secondary | ICD-10-CM

## 2015-09-21 DIAGNOSIS — H43813 Vitreous degeneration, bilateral: Secondary | ICD-10-CM

## 2015-09-21 DIAGNOSIS — H59033 Cystoid macular edema following cataract surgery, bilateral: Secondary | ICD-10-CM

## 2015-09-21 DIAGNOSIS — H35033 Hypertensive retinopathy, bilateral: Secondary | ICD-10-CM

## 2015-10-05 ENCOUNTER — Other Ambulatory Visit: Payer: Self-pay | Admitting: Cardiovascular Disease

## 2015-10-05 NOTE — Telephone Encounter (Signed)
Rx request sent to pharmacy.  

## 2015-10-11 ENCOUNTER — Telehealth: Payer: Self-pay | Admitting: Neurology

## 2015-10-11 NOTE — Telephone Encounter (Signed)
Spoke with patient's daughter and she states patient's memory has gotten much worse- he is getting lost while driving and forgetting a lot of things. He has not been seen since 06/2014. He had a follow up 09/2014 but told us that he had stopped memory medication and cancelled appt. His daughter wants him restarted on medication (aricept) and wants sooner appt than November 25, 2015. Made her aware we do not have sooner appt. I advised it looked like patient had side effects from meds- so we would not refill them because Dr. Karel JarvisAquino would likely change him to something else, especially since he has not taken this medication in over a year. I advised her to keep appt for a little over a month from now and he should not be driving. She expressed understanding and agreed to this plan.

## 2015-10-11 NOTE — Telephone Encounter (Signed)
Left message on machine for patient's daughter to call back.   

## 2015-10-11 NOTE — Telephone Encounter (Signed)
Pt daughter Rosanne Guttingsonja sharpe called and needs to talk to someone about her father she states the memory is getting worst and patient has missed placed his medication please call [phone]

## 2015-10-18 ENCOUNTER — Telehealth: Payer: Self-pay | Admitting: Cardiovascular Disease

## 2015-10-18 NOTE — Telephone Encounter (Signed)
error 

## 2015-10-21 ENCOUNTER — Other Ambulatory Visit: Payer: Self-pay | Admitting: Internal Medicine

## 2015-11-02 ENCOUNTER — Encounter (INDEPENDENT_AMBULATORY_CARE_PROVIDER_SITE_OTHER): Payer: Medicare Other | Admitting: Ophthalmology

## 2015-11-02 DIAGNOSIS — H43813 Vitreous degeneration, bilateral: Secondary | ICD-10-CM

## 2015-11-02 DIAGNOSIS — I10 Essential (primary) hypertension: Secondary | ICD-10-CM

## 2015-11-02 DIAGNOSIS — H59033 Cystoid macular edema following cataract surgery, bilateral: Secondary | ICD-10-CM

## 2015-11-02 DIAGNOSIS — H35033 Hypertensive retinopathy, bilateral: Secondary | ICD-10-CM

## 2015-11-10 ENCOUNTER — Ambulatory Visit (INDEPENDENT_AMBULATORY_CARE_PROVIDER_SITE_OTHER): Payer: Medicare Other | Admitting: Internal Medicine

## 2015-11-10 ENCOUNTER — Encounter: Payer: Self-pay | Admitting: Internal Medicine

## 2015-11-10 ENCOUNTER — Other Ambulatory Visit: Payer: Self-pay | Admitting: Cardiovascular Disease

## 2015-11-10 ENCOUNTER — Other Ambulatory Visit (INDEPENDENT_AMBULATORY_CARE_PROVIDER_SITE_OTHER): Payer: Medicare Other

## 2015-11-10 VITALS — BP 140/84 | HR 100 | Temp 98.0°F | Resp 20 | Wt 181.0 lb

## 2015-11-10 DIAGNOSIS — I10 Essential (primary) hypertension: Secondary | ICD-10-CM

## 2015-11-10 DIAGNOSIS — J449 Chronic obstructive pulmonary disease, unspecified: Secondary | ICD-10-CM | POA: Diagnosis not present

## 2015-11-10 DIAGNOSIS — R195 Other fecal abnormalities: Secondary | ICD-10-CM

## 2015-11-10 LAB — CBC WITH DIFFERENTIAL/PLATELET
BASOS PCT: 0.6 % (ref 0.0–3.0)
Basophils Absolute: 0 10*3/uL (ref 0.0–0.1)
EOS ABS: 0.1 10*3/uL (ref 0.0–0.7)
EOS PCT: 1.8 % (ref 0.0–5.0)
HCT: 39.7 % (ref 39.0–52.0)
HEMOGLOBIN: 13.2 g/dL (ref 13.0–17.0)
LYMPHS ABS: 1.3 10*3/uL (ref 0.7–4.0)
Lymphocytes Relative: 20.3 % (ref 12.0–46.0)
MCHC: 33.2 g/dL (ref 30.0–36.0)
MCV: 91.3 fl (ref 78.0–100.0)
MONO ABS: 0.7 10*3/uL (ref 0.1–1.0)
Monocytes Relative: 10.4 % (ref 3.0–12.0)
NEUTROS ABS: 4.4 10*3/uL (ref 1.4–7.7)
NEUTROS PCT: 66.9 % (ref 43.0–77.0)
PLATELETS: 314 10*3/uL (ref 150.0–400.0)
RBC: 4.35 Mil/uL (ref 4.22–5.81)
RDW: 13.6 % (ref 11.5–15.5)
WBC: 6.6 10*3/uL (ref 4.0–10.5)

## 2015-11-10 LAB — PROTIME-INR
INR: 1.1 ratio — AB (ref 0.8–1.0)
PROTHROMBIN TIME: 11.1 s (ref 9.6–13.1)

## 2015-11-10 MED ORDER — AMLODIPINE BESYLATE 2.5 MG PO TABS: 2.5000 mg | ORAL_TABLET | Freq: Every day | ORAL | Status: DC

## 2015-11-10 NOTE — Progress Notes (Signed)
Pre visit review using our clinic review tool, if applicable. No additional management support is needed unless otherwise documented below in the visit note. 

## 2015-11-10 NOTE — Patient Instructions (Addendum)
Please take all new medication as prescribed - the new low dose Blood Pressure pill  Please continue all other medications as before, and refills have been done if requested.  Please have the pharmacy call with any other refills you may need.  Please keep your appointments with your specialists as you may have planned - Dr Karel JarvisAquino after August 2017, and Dr Tresa EndoKelly July 25  Your Blood tests were done today  You will be contacted by phone if any changes need to be made immediately.  Otherwise, you will receive a letter about your results with an explanation, but please check with MyChart first.  Please remember to sign up for MyChart if you have not done so, as this will be important to you in the future with finding out test results, communicating by private email, and scheduling acute appointments online when needed.  Please return in 6 months, or sooner if needed
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
## 2015-11-11 ENCOUNTER — Encounter: Payer: Self-pay | Admitting: Internal Medicine

## 2015-11-11 LAB — HEPATIC FUNCTION PANEL
ALK PHOS: 63 U/L (ref 39–117)
ALT: 15 U/L (ref 0–53)
AST: 19 U/L (ref 0–37)
Albumin: 4.3 g/dL (ref 3.5–5.2)
BILIRUBIN DIRECT: 0.1 mg/dL (ref 0.0–0.3)
BILIRUBIN TOTAL: 0.4 mg/dL (ref 0.2–1.2)
Total Protein: 7.2 g/dL (ref 6.0–8.3)

## 2015-11-11 LAB — BASIC METABOLIC PANEL
BUN: 12 mg/dL (ref 6–23)
CHLORIDE: 103 meq/L (ref 96–112)
CO2: 30 mEq/L (ref 19–32)
Calcium: 9.8 mg/dL (ref 8.4–10.5)
Creatinine, Ser: 1.31 mg/dL (ref 0.40–1.50)
GFR: 67.59 mL/min (ref >60.00)
Glucose, Bld: 107 mg/dL — ABNORMAL HIGH (ref 70–99)
POTASSIUM: 4.7 meq/L (ref 3.5–5.1)
SODIUM: 140 meq/L (ref 135–145)

## 2015-11-14 DIAGNOSIS — R195 Other fecal abnormalities: Secondary | ICD-10-CM | POA: Insufficient documentation

## 2015-11-14 NOTE — Assessment & Plan Note (Signed)
Exam benign, for cbc,  to f/u any worsening symptoms or concerns

## 2015-11-14 NOTE — Assessment & Plan Note (Signed)
BP Readings from Last 3 Encounters:  11/10/15 140/84  05/31/15 114/74  05/15/15 126/70   To add amlodipine 2.5 mg, follow bp at home and next visit,  to f/u any worsening symptoms or concerns

## 2015-11-14 NOTE — Assessment & Plan Note (Signed)
stable overall by history and exam, recent data reviewed with pt, and pt to continue medical treatment as before,  to f/u any worsening symptoms or concerns @LASTSAO2(3)@  

## 2015-11-16 ENCOUNTER — Ambulatory Visit (INDEPENDENT_AMBULATORY_CARE_PROVIDER_SITE_OTHER): Payer: Medicare Other | Admitting: Nurse Practitioner

## 2015-11-16 ENCOUNTER — Encounter: Payer: Self-pay | Admitting: Nurse Practitioner

## 2015-11-16 VITALS — BP 131/84 | HR 82 | Ht 66.0 in | Wt 179.0 lb

## 2015-11-16 DIAGNOSIS — I517 Cardiomegaly: Secondary | ICD-10-CM | POA: Insufficient documentation

## 2015-11-16 DIAGNOSIS — I119 Hypertensive heart disease without heart failure: Secondary | ICD-10-CM | POA: Insufficient documentation

## 2015-11-16 DIAGNOSIS — E785 Hyperlipidemia, unspecified: Secondary | ICD-10-CM | POA: Diagnosis not present

## 2015-11-16 DIAGNOSIS — I951 Orthostatic hypotension: Secondary | ICD-10-CM | POA: Diagnosis not present

## 2015-11-16 NOTE — Patient Instructions (Signed)
Alexander Givens, NP, recommends that you schedule a follow-up appointment in 1 year with Dr [name]. You will receive a reminder letter in the mail two months in advance. If you don't receive a letter, please call our office to schedule the follow-up appointment.  If you need a refill on your cardiac medications before your next appointment, please call your pharmacy.
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
## 2015-11-18 ENCOUNTER — Telehealth: Payer: Self-pay | Admitting: *Deleted

## 2015-11-18 NOTE — Telephone Encounter (Signed)
Rec'd call from pharmacist Trinna Post wanting to verify script for amlodipine 2.5 mg pt was ? If he need to be taking. Inform alex yes per chart MD add on 7/19 to take 1 pill a day. He stated just wanted to verify if he need to be taking for pt...Raechel Chute

## 2015-11-25 ENCOUNTER — Ambulatory Visit (INDEPENDENT_AMBULATORY_CARE_PROVIDER_SITE_OTHER): Payer: Medicare Other | Admitting: Neurology

## 2015-11-25 ENCOUNTER — Encounter: Payer: Self-pay | Admitting: Neurology

## 2015-11-25 VITALS — BP 128/80 | HR 80 | Ht 66.0 in | Wt 179.4 lb

## 2015-11-25 DIAGNOSIS — F0390 Unspecified dementia without behavioral disturbance: Secondary | ICD-10-CM | POA: Diagnosis not present

## 2015-11-25 DIAGNOSIS — F03A0 Unspecified dementia, mild, without behavioral disturbance, psychotic disturbance, mood disturbance, and anxiety: Secondary | ICD-10-CM

## 2015-11-25 MED ORDER — RIVASTIGMINE TARTRATE 1.5 MG PO CAPS: 1.5000 mg | ORAL_CAPSULE | Freq: Two times a day (BID) | ORAL | 11 refills | Status: DC

## 2015-11-25 NOTE — Patient Instructions (Signed)
1. Start Exelon 1.5mg  capsules, take 1 capsule twice a day 2. Continue to monitor driving, if family has concerns, recommend having a DMV evaluation 3. Physical exercise and brain stimulation exercises are important for brain health 4. Follow-up in 6 months, call for any changes

## 2015-11-25 NOTE — Progress Notes (Signed)
NEUROLOGY FOLLOW UP OFFICE NOTE  Alexander [name] [id number]  HISTORY OF PRESENT ILLNESS: I had the pleasure of seeing Alexander [name] in follow-up in the neurology clinic on 11/25/2015.  The patient was last seen more than a year ago for mild dementia. MMSE in March 2016 was 19/28 (illiterate). He is accompanied by his wife who helps supplement the history today today.  Records and images were personally reviewed where available.  His daughter called our office to report his memory has gotten much worse- he is getting lost while driving and forgetting a lot of things. He stopped Aricept due to side effects of chest pain, headaches. He reports forgetting dates, his wife states he "forgets everything." He denies getting lost driving but does report that he would have to think of where he is when he is going somewhere, at times he has had to pull over and think then reorient. He forgets if he took his medication and would go back to take it, he stopped using his pillbox for unrecalled reasons. His wife is in charge of bills. No difficulties with ADLs. He denies any headaches, diplopia, dysarthria, dysphagia, neck/back pain, focal numbness/tingling/weakness, anosmia, or tremors.  HPI: This is a pleasant 82 yo RH man with a history of hypertension, hyperlipidemia,with worsening memory loss. When asked about his memory, he states "there ain't none." He noticed memory changes in his late 35s, he would forget names, however recently he would be talking about something and forget what he was talking about. He misplaces things frequently. He got lost driving one time. He was picking his wife up one time and did not know where he was (15 years ago). He occasionally forgets to take his medications and has to be told repeatedly to take it. He has noticed word-finding difficulties. No problems performing ADLs independently. He lives with his wife.  His daughter has noticed changes more in the past year or so. She  would tell him something then the next minute he forgets. He asks the same questions repeatedly. At the end of the visit, his daughter walked out to speak with me personally, and stated that her father underreported symptoms, and that she and her mother have been concerned about his driving. She did not want to mention this in front of him because he would get upset. She denies any paranoia. He states he sees things moving around especially at night, like shadows, sometimes he thinks something is crawling around on the fence. He has constipation.  His father had memory loss.   I personally reviewed MRI brain done in 01/2014 which showed prominent mineral deposition in the globus pallidus, midbrain, and dentate nucleus. This may be normal but has been described in Parkinson's like syndromes. Moderate chronic microvascular change.  PAST MEDICAL HISTORY: Past Medical History:  Diagnosis Date  . Asthma   . Constipation   . COPD (chronic obstructive pulmonary disease) (HCC)    chronic dyspnea  . Glaucoma   . Hemorrhoids   . HYPERLIPIDEMIA   . Hypertensive heart disease   . LVH (left ventricular hypertrophy)    a. 2014 Echo: EF 65-70%, no rwma, Gr1 DD, mild MR.  . Non-obstructive CAD (coronary artery disease)    a. 02/2009 Cath: essentially nl cors; b. 07/2014 MV: mild diaph atten, no ischemia-->low risk.    MEDICATIONS: Current Outpatient Prescriptions on File Prior to Visit  Medication Sig Dispense Refill  . albuterol (PROAIR HFA) 108 (90 BASE) MCG/ACT inhaler Inhale 2 puffs into the  lungs every 4 (four) hours as needed for wheezing or shortness of breath. 1 Inhaler 0  . albuterol (PROVENTIL) (2.5 MG/3ML) 0.083% nebulizer solution Take 3 mLs (2.5 mg total) by nebulization every 6 (six) hours as needed for wheezing or shortness of breath. 75 mL 12  . amLODipine (NORVASC) 2.5 MG tablet Take 1 tablet (2.5 mg total) by mouth daily. 30 tablet 11  . aspirin 81 MG chewable tablet Chew 1 tablet (81 mg  total) by mouth daily. 30 tablet 0  . docusate sodium (COLACE) 50 MG capsule Take 50 mg by mouth daily. Reported on 04/10/2015    . hydrochlorothiazide (MICROZIDE) 12.5 MG capsule TAKE 1 CAPSULE (12.5 MG TOTAL) BY MOUTH EVERY OTHER DAY. 45 capsule 2  . ILEVRO 0.3 % ophthalmic suspension Apply 1 drop to eye at bedtime.  1  . ipratropium (ATROVENT) 0.02 % nebulizer solution Take 2.5 mLs (0.5 mg total) by nebulization 3 (three) times daily as needed for wheezing or shortness of breath. Dx 496 120 mL 5  . ketorolac (ACULAR) 0.4 % SOLN Place 1 drop into the right eye 3 (three) times daily.  13  . LINZESS 145 MCG CAPS capsule Take 1 tablet by mouth daily before breakfast. Reported on 05/15/2015  10  . losartan (COZAAR) 100 MG tablet TAKE 1 TABLET (100 MG TOTAL) BY MOUTH DAILY. 30 tablet 11  . metoprolol succinate (TOPROL-XL) 50 MG 24 hr tablet TAKE 1 TABLET (50 MG TOTAL) BY MOUTH DAILY. PLEASE SCHEDULE APPOINTMENT FOR REFILLS. 30 tablet 0  . nystatin (MYCOSTATIN) 100000 UNIT/ML suspension Take 5 mLs (500,000 Units total) by mouth 4 (four) times daily. 180 mL 1  . pantoprazole (PROTONIX) 40 MG tablet Take 1 tablet (40 mg total) by mouth daily as needed (for heartburn). 15 tablet 0  . prednisoLONE acetate (PRED FORTE) 1 % ophthalmic suspension INSTILL ONE DROP INTO RIGHT EYE 3 TIMES A DAY AND ONE DROP INTO LEFT EYE 4 TIMES A DAY  4  . rosuvastatin (CRESTOR) 10 MG tablet Take 1 tablet (10 mg total) by mouth daily. 30 tablet 2  . SYMBICORT 160-4.5 MCG/ACT inhaler INHALE 2 PUFFS BY MOUTH INTO THE LUNGS 2 (TWO) TIMES DAILY. 10.2 Inhaler 5  . triamcinolone ointment (KENALOG) 0.5 % Apply 1 application topically 2 (two) times daily. 15 g 1  . VIAGRA 100 MG tablet TAKE 0.5-1 TABLETS (50-100 MG TOTAL) BY MOUTH DAILY AS NEEDED FOR ERECTILE DYSFUNCTION. 6 tablet 2   No current facility-administered medications on file prior to visit.     ALLERGIES: No Known Allergies  FAMILY HISTORY: Family History  Problem  Relation Age of Onset  . Pancreatic cancer Mother 54  . Hypertension Mother   . Aneurysm Father 74    brain  . Prostate cancer Brother     x 2 bro    SOCIAL HISTORY: Social History   Social History  . Marital status: Married    Spouse name: N/A  . Number of children: 8  . Years of education: 8   Occupational History  . RETIRED Retired    Location manager   Social History Main Topics  . Smoking status: Former Smoker    Packs/day: 0.25    Years: 48.00    Types: Cigarettes    Quit date: 04/24/2000  . Smokeless tobacco: Never Used  . Alcohol use No  . Drug use: No  . Sexual activity: Yes   Other Topics Concern  . Not on file   Social History Narrative  EXERCISES INTERMITTENTLY AT THE GYM      5 GRANDCHILDREN      2 GREAT-CHILDREN            WEARS GLASSES    REVIEW OF SYSTEMS: Constitutional: No fevers, chills, or sweats, no generalized fatigue, change in appetite Eyes: No visual changes, double vision, eye pain Ear, nose and throat: No hearing loss, ear pain, nasal congestion, sore throat Cardiovascular: No chest pain, palpitations Respiratory:  No shortness of breath at rest or with exertion, wheezes GastrointestinaI: No nausea, vomiting, diarrhea, abdominal pain, fecal incontinence Genitourinary:  No dysuria, urinary retention or frequency Musculoskeletal:  No neck pain, back pain Integumentary: No rash, pruritus, skin lesions Neurological: as above Psychiatric: No depression, insomnia, anxiety Endocrine: No palpitations, fatigue, diaphoresis, mood swings, change in appetite, change in weight, increased thirst Hematologic/Lymphatic:  No anemia, purpura, petechiae. Allergic/Immunologic: no itchy/runny eyes, nasal congestion, recent allergic reactions, rashes  PHYSICAL EXAM: Vitals:   11/25/15 0845  BP: 128/80  Pulse: 80   General: No acute distress Head:  Normocephalic/atraumatic Neck: supple, no paraspinal tenderness, full range of motion Heart:   Regular rate and rhythm Lungs:  Clear to auscultation bilaterally Back: No paraspinal tenderness Skin/Extremities: No rash, no edema Neurological Exam: alert and oriented to person, place, and time. No aphasia or dysarthria. Fund of knowledge is appropriate.  Recent and remote memory are intact.  Attention and concentration are normal.    Able to name objects and repeat phrases. CDT 5/5 MMSE - Mini Mental State Exam 11/25/2015 07/10/2014  Orientation to time 3 3  Orientation to Place 5 4  Registration 3 3  Attention/ Calculation 0 1  Recall 0 1  Language- name 2 objects 2 2  Language- repeat 1 1  Language- follow 3 step command 3 3  Language- read & follow direction 0 (illiterate) 0  Write a sentence 0 (illiterate) 0  Copy design 1 1  Total score 18/28 19    Cranial nerves: Pupils equal, round, reactive to light.Extraocular movements intact with no nystagmus. Visual fields full. Facial sensation intact. No facial asymmetry. Tongue, uvula, palate midline.  Motor: Bulk and tone normal, muscle strength 5/5 throughout with no pronator drift.  Sensation to light touch intact.  No extinction to double simultaneous stimulation.  Deep tendon reflexes 2+ throughout, toes downgoing.  Finger to nose testing intact.  Gait narrow-based and steady, able to tandem walk adequately.  Romberg negative.  IMPRESSION: This is an 82 yo RH man with vascular risk factors including hypertension, hyperlipidemia, and dementia. MMSE today 18/28 (patient illiterate, 19/28 in March 2016), again suggestive of mild dementia. He did not tolerate Aricept and will try Exelon 1.5mg  BID. Side effects were discussed. His daughter expressed concern about driving, he became defensive and stated he is doing well. I have advised doing a driving evaluation, he feels this is unnecessary and was advised to speak to his daughter about her concerns. The importance of physical exercise and brain stimulation exercises, control of vascular risk  factors, for brain health were again discussed. He will follow-up in 6 months and knows to call for any problems with the medication.  Thank you for allowing me to participate in his care.  Please do not hesitate to call for any questions or concerns.  The duration of this appointment visit was 25 minutes of face-to-face time with the patient.  Greater than 50% of this time was spent in counseling, explanation of diagnosis, planning of further management, and coordination of care.  Alexander [name], M.D.   CC: Dr. Oliver Barre

## 2015-11-29 ENCOUNTER — Ambulatory Visit: Payer: Medicare Other | Admitting: Adult Health

## 2015-12-15 ENCOUNTER — Encounter (INDEPENDENT_AMBULATORY_CARE_PROVIDER_SITE_OTHER): Payer: Medicare Other | Admitting: Ophthalmology

## 2015-12-15 DIAGNOSIS — H59033 Cystoid macular edema following cataract surgery, bilateral: Secondary | ICD-10-CM | POA: Diagnosis not present

## 2015-12-15 DIAGNOSIS — I10 Essential (primary) hypertension: Secondary | ICD-10-CM

## 2015-12-15 DIAGNOSIS — H35033 Hypertensive retinopathy, bilateral: Secondary | ICD-10-CM

## 2015-12-15 DIAGNOSIS — H43813 Vitreous degeneration, bilateral: Secondary | ICD-10-CM | POA: Diagnosis not present

## 2015-12-21 ENCOUNTER — Other Ambulatory Visit: Payer: Self-pay | Admitting: Cardiovascular Disease

## 2015-12-21 NOTE — Telephone Encounter (Signed)
Rx request sent to pharmacy.  

## 2015-12-22 ENCOUNTER — Telehealth: Payer: Self-pay | Admitting: *Deleted

## 2015-12-22 NOTE — Telephone Encounter (Signed)
Reduce dose to 1 tablet at night for now and let us know in 2 weeks if dizziness continues. Thanks

## 2015-12-22 NOTE — Telephone Encounter (Signed)
I spoke with patient and his wife and they said that he was doing really well when he first started the exelon but now it is making him dizzy and she said that his demeanor changes.  Please advise.

## 2015-12-22 NOTE — Telephone Encounter (Signed)
[name] given instructions.

## 2015-12-23 ENCOUNTER — Telehealth: Payer: Self-pay | Admitting: Neurology

## 2015-12-23 NOTE — Telephone Encounter (Signed)
Alexander FillerHarry [name] [birth year]. His daughter Celine MansSonja called needing to speak with Dr. Karel JarvisAquino regarding her dad and his last visit on 11/25/15. Her # is [phone]. Thank you

## 2015-12-23 NOTE — Telephone Encounter (Signed)
I spoke with patient's daughter and she was just checking to see how patient's appointment went.

## 2016-01-25 ENCOUNTER — Encounter (INDEPENDENT_AMBULATORY_CARE_PROVIDER_SITE_OTHER): Payer: Medicare Other | Admitting: Ophthalmology

## 2016-01-25 DIAGNOSIS — I10 Essential (primary) hypertension: Secondary | ICD-10-CM

## 2016-01-25 DIAGNOSIS — H43813 Vitreous degeneration, bilateral: Secondary | ICD-10-CM | POA: Diagnosis not present

## 2016-01-25 DIAGNOSIS — H35033 Hypertensive retinopathy, bilateral: Secondary | ICD-10-CM

## 2016-02-03 ENCOUNTER — Encounter (INDEPENDENT_AMBULATORY_CARE_PROVIDER_SITE_OTHER): Payer: Medicare Other | Admitting: Ophthalmology

## 2016-03-01 ENCOUNTER — Telehealth: Payer: Self-pay | Admitting: Neurology

## 2016-03-01 NOTE — Telephone Encounter (Signed)
Alexander FillerHarry [name] 25-Jun-2032. His # [phone]. His medication Rivastigmine 1.5 mg that Dr. Karel JarvisAquino prescribed gives him a headache once he takes it. He was not sure if that was normal? Thank you

## 2016-03-01 NOTE — Telephone Encounter (Signed)
Contacted patient's wife and she states patient is only taking Rivastigmine at night but he is having headaches after taking it. Wants to know if this is normal. Please advise.

## 2016-03-03 NOTE — Telephone Encounter (Signed)
Headaches can happen in about 10% of patients taking it. Are they unbearable headaches? He can try reducing to 1/2 tablet at night for 2 weeks for body to get used to it.

## 2016-03-03 NOTE — Telephone Encounter (Signed)
[name] states the headaches are slight now taking the 1 tablet a night. Advised to reduce to 1/2 tab at night for the two weeks to let body adjust.

## 2016-03-17 ENCOUNTER — Ambulatory Visit (INDEPENDENT_AMBULATORY_CARE_PROVIDER_SITE_OTHER): Payer: Medicare Other | Admitting: Internal Medicine

## 2016-03-17 ENCOUNTER — Encounter: Payer: Self-pay | Admitting: Internal Medicine

## 2016-03-17 VITALS — BP 118/70 | HR 94 | Wt 182.0 lb

## 2016-03-17 DIAGNOSIS — J449 Chronic obstructive pulmonary disease, unspecified: Secondary | ICD-10-CM | POA: Diagnosis not present

## 2016-03-17 DIAGNOSIS — R058 Other specified cough: Secondary | ICD-10-CM

## 2016-03-17 DIAGNOSIS — R05 Cough: Secondary | ICD-10-CM

## 2016-03-17 MED ORDER — BUDESONIDE-FORMOTEROL FUMARATE 160-4.5 MCG/ACT IN AERO: INHALATION_SPRAY | RESPIRATORY_TRACT | 5 refills | Status: DC

## 2016-03-17 MED ORDER — AMOXICILLIN-POT CLAVULANATE 875-125 MG PO TABS: 1.0000 | ORAL_TABLET | Freq: Two times a day (BID) | ORAL | 0 refills | Status: AC

## 2016-03-17 MED ORDER — PREDNISONE 10 MG PO TABS: ORAL_TABLET | ORAL | 0 refills | Status: DC

## 2016-03-17 NOTE — Patient Instructions (Addendum)
Augmentin 875 mg take one pill twice daily  X 10 days - take at breakfast and supper with large glass of water.  It would help reduce the usual side effects (diarrhea and yeast infections) if you ate cultured yogurt at lunch.   Prednisone 10 mg take  4 each am x 2 days,   2 each am x 2 days,  1 each am x 2 days and stop   Take the famotidine one hour before bedtime   GERD (REFLUX)  is an extremely common cause of respiratory symptoms just like yours , many times with no obvious heartburn at all.    It can be treated with medication, but also with lifestyle changes including elevation of the head of your bed (ideally with 6 inch  bed blocks),  Smoking cessation, avoidance of late meals, excessive alcohol, and avoid fatty foods, chocolate, peppermint, colas, red wine, and acidic juices such as orange juice.  NO MINT OR MENTHOL PRODUCTS SO NO COUGH DROPS  USE SUGARLESS CANDY INSTEAD (Jolley ranchers or Stover's or Life Savers) or even ice chips will also do - the key is to swallow to prevent all throat clearing. NO OIL BASED VITAMINS - use powdered substitutes.    Plan A = Automatic = symbicort 160 Take 2 puffs first thing in am and then another 2 puffs about 12 hours later.    Work on inhaler technique:  relax and gently blow all the way out then take a nice smooth deep breath back in, triggering the inhaler at same time you start breathing in.  Hold for up to 5 seconds if you can. Blow out thru nose. Rinse and gargle with water when done      Plan B = Backup Only use your albuterol as a rescue medication to be used if you can't catch your breath by resting or doing a relaxed purse lip breathing pattern.  - The less you use it, the better it will work when you need it. - Ok to use the inhaler up to 2 puffs  every 4 hours if you must but call for appointment if use goes up over your usual need - Don't leave home without it !!  (think of it like the spare tire for your car)   Plan C = Crisis -  only use your albuterol nebulizer if you first try Plan B and it fails to help > ok to use the nebulizer up to every 4 hours but if start needing it regularly call for immediate appointment  Please schedule a follow up office visit in 6 weeks, call sooner if needed  To see Tammy NP on return to work out a better/ more affordable longterm plan for you inhalers/nebulizer - so bring all medications with you

## 2016-03-17 NOTE — Progress Notes (Signed)
Subjective:    Patient ID: Alexander MorelHarry L [name], male    DOB: [birth year], 82 y.o.   MRN: [id number]     Brief patient profile:  81yobm quit smoking 04/2000    gold III COPD & dyspnea   01/09/13  Cardiac evaluation - echo nml  Stress test - peak VO2 68%, limited by ventilation  c. cath Alexander [name](Alexander [name]) -11/10 - nml LV fn, nml coronaries  Spirometry >>FEV1 1.14 - 45% -moderate airway obstruction     02/03/2014 f/u ov/Alexander [name] re:  GOLD III COPD/ very poor hfa  Chief Complaint  Patient presents with  . Acute Visit    Pt c/o wheezing, increased SOB and cough for the past 10-14 days. Cough is prod with moderate clear to grey sputum.      confused with meds, relying on neb sab at baseline and gradually worse x 2 weeks but still p neb can get comfortable at rest, sob across the room. " Prednisone is the only thing that helps me but it wears off over a week:" rec Symbicort 160 Take 2 puffs first thing in am and then another 2 puffs about 12 hours later.  Work on inhaler technique:   Prednisone 10 mg take  4 each am x 2 days,   2 each am x 2 days,  1 each am x 2 days and stop  Only use your albuterol (ventolin)  as a rescue medication  If not better, then use  Nebulizer up to every   4h  as needed    03/17/2016  f/u ov/Alexander [name] re:  COPD III / worse cough on ppi qam / h2 hs  Chief Complaint  Patient presents with  . Acute Visit    Pt c/o cough x 1 month with green sputum.  He also c/o having some increased SOB and wheezing.   cough x one month, indolent onset persistently daily but  is worse noct and tends to keep him up with green mucus each am x one month assoc with purulent am sputum and mild doe over baseline, still very confused with meds  No obvious day to day or daytime variability or assoc  cp or chest tightness, subjective wheeze or overt  r hb symptoms. No unusual exp hx or h/o childhood pna/ asthma or knowledge of premature birth.  Sleeping ok without nocturnal  or early am exacerbation  of  respiratory  c/o's or need for noct saba. Also denies any obvious fluctuation of symptoms with weather or environmental changes or other aggravating or alleviating factors except as outlined above   Current Medications, Allergies, Complete Past Medical History, Past Surgical History, Family History, and Social History were reviewed in Alexander [name] [facility] electronic medical record.  ROS  The following are not active complaints unless bolded sore throat, dysphagia, dental problems, itching, sneezing,  nasal congestion or excess/ purulent secretions, ear ache,   fever, chills, sweats, unintended wt loss, classically pleuritic or exertional cp, hemoptysis,  orthopnea pnd or leg swelling, presyncope, palpitations, abdominal pain, anorexia, nausea, vomiting, diarrhea  or change in bowel or bladder habits, change in stools or urine, dysuria,hematuria,  rash, arthralgias, visual complaints, headache, numbness, weakness or ataxia or problems with walking or coordination,  change in mood/affect or memory.               Objective:   Physical Exam  amb bm nad - vital signs reviewed - Note on arrival 02 sats  96% on RA     03/17/2016  182   02/03/14 177 lb (80.287 kg)  01/27/14 177 lb 12 oz (80.627 kg)  01/06/14 173 lb 8 oz (78.699 kg)     HEENT: nl   d oropharynx. Nl external ear canals without cough reflex- edentulous with upper plate in place - moderate bilateral non-specific turbinate edema    NECK :  without JVD/Nodes/TM/ nl carotid upstrokes bilaterally   LUNGS: no acc muscle use,  Nl contour chest which is clear to A and P bilaterally without cough on insp or exp maneuvers   CV:  RRR  no s3 or murmur or increase in P2, no edema   ABD:  soft and nontender with nl inspiratory excursion in the supine position. No bruits or organomegaly, bowel sounds nl  MS:  Nl gait/ ext warm without deformities, calf tenderness, cyanosis or clubbing No obvious joint restrictions   SKIN: warm and dry  without lesions    NEURO:  alert, approp, nl sensorium with  no motor deficits                Assessment & Plan:

## 2016-03-18 ENCOUNTER — Encounter: Payer: Self-pay | Admitting: Internal Medicine

## 2016-03-18 DIAGNOSIS — R05 Cough: Secondary | ICD-10-CM | POA: Insufficient documentation

## 2016-03-18 DIAGNOSIS — R058 Other specified cough: Secondary | ICD-10-CM | POA: Insufficient documentation

## 2016-03-18 NOTE — Assessment & Plan Note (Signed)
12/2012 FEV1 1.14 - 45% ; Dr Richardo HanksAlva     Continues to be very confused about how/ when to use his various inhalers and actually brought an empty symbicort with him today but he is clear on exam and I don't really believe the copd is the cause of his cough (see separate a/p)    For now rec re-organize his meds using an ABC plan - see avs  - The proper method of use, as well as anticipated side effects, of a metered-dose inhaler are discussed and demonstrated to the patient. Improved effectiveness after extensive coaching during this visit to a level of approximately 75 % from a baseline of 50 %  So continue symb 160 2bid foor now

## 2016-03-18 NOTE — Assessment & Plan Note (Signed)
Upper airway cough syndrome (previously labeled PNDS) , is  so named because it's frequently impossible to sort out how much is  CR/sinusitis with freq throat clearing (which can be related to primary GERD)   vs  causing  secondary (" extra esophageal")  GERD from wide swings in gastric pressure that occur with throat clearing, often  promoting self use of mint and menthol lozenges that reduce the lower esophageal sphincter tone and exacerbate the problem further in a cyclical fashion.   These are the same pts (now being labeled as having "irritable larynx syndrome" by some cough centers) who not infrequently have a history of having failed to tolerate ace inhibitors,  dry powder inhalers or biphosphonates or report having atypical/extraesophageal reflux symptoms that don't respond to standard doses of PPI  and are easily confused as having aecopd or asthma flares by even experienced allergists/ pulmonologists (myself included).   rec max rx for sinus dz with augmentin then sinus CT next step (cxr also as our was closed today) rec pred x 6 days only for any inflammatory/ allergic component  I had an extended discussion with the patient/wife  reviewing all relevant studies completed to date and  lasting 15 to 20 minutes of a 25 minute visit    Each maintenance medication was reviewed in detail including most importantly the difference between maintenance and prns and under what circumstances the prns are to be triggered using an action plan format that is not reflected in the computer generated alphabetically organized AVS.    Please see instructions for details which were reviewed in writing and the patient given a copy highlighting the part that I personally wrote and discussed at today's ov.

## 2016-03-29 ENCOUNTER — Ambulatory Visit (INDEPENDENT_AMBULATORY_CARE_PROVIDER_SITE_OTHER)
Admission: RE | Admit: 2016-03-29 | Discharge: 2016-03-29 | Disposition: A | Discharge status: Home / Self Care | Payer: Medicare Other | Source: Ambulatory Visit | Attending: Acute Care | Admitting: Acute Care

## 2016-03-29 ENCOUNTER — Ambulatory Visit (INDEPENDENT_AMBULATORY_CARE_PROVIDER_SITE_OTHER): Payer: Medicare Other | Admitting: Acute Care

## 2016-03-29 ENCOUNTER — Other Ambulatory Visit (INDEPENDENT_AMBULATORY_CARE_PROVIDER_SITE_OTHER): Payer: Medicare Other

## 2016-03-29 ENCOUNTER — Encounter: Payer: Self-pay | Admitting: Acute Care

## 2016-03-29 ENCOUNTER — Telehealth: Payer: Self-pay | Admitting: Internal Medicine

## 2016-03-29 VITALS — BP 134/72 | HR 82 | Temp 97.9°F | Ht 66.0 in | Wt 179.0 lb

## 2016-03-29 DIAGNOSIS — Z0000 Encounter for general adult medical examination without abnormal findings: Secondary | ICD-10-CM | POA: Diagnosis not present

## 2016-03-29 DIAGNOSIS — R05 Cough: Secondary | ICD-10-CM

## 2016-03-29 DIAGNOSIS — J449 Chronic obstructive pulmonary disease, unspecified: Secondary | ICD-10-CM | POA: Diagnosis not present

## 2016-03-29 DIAGNOSIS — R058 Other specified cough: Secondary | ICD-10-CM

## 2016-03-29 LAB — HEPATIC FUNCTION PANEL
ALK PHOS: 69 U/L (ref 39–117)
ALT: 12 U/L (ref 0–53)
AST: 20 U/L (ref 0–37)
Albumin: 4.3 g/dL (ref 3.5–5.2)
BILIRUBIN DIRECT: 0.1 mg/dL (ref 0.0–0.3)
BILIRUBIN TOTAL: 0.4 mg/dL (ref 0.2–1.2)
Total Protein: 6.6 g/dL (ref 6.0–8.3)

## 2016-03-29 LAB — BASIC METABOLIC PANEL
BUN: 15 mg/dL (ref 6–23)
CHLORIDE: 105 meq/L (ref 96–112)
CO2: 33 mEq/L — ABNORMAL HIGH (ref 19–32)
Calcium: 9.5 mg/dL (ref 8.4–10.5)
Creatinine, Ser: 1.25 mg/dL (ref 0.40–1.50)
GFR: 71.28 mL/min (ref >60.00)
Glucose, Bld: 111 mg/dL — ABNORMAL HIGH (ref 70–99)
POTASSIUM: 4.9 meq/L (ref 3.5–5.1)
SODIUM: 143 meq/L (ref 135–145)

## 2016-03-29 LAB — CBC WITH DIFFERENTIAL/PLATELET
BASOS ABS: 0 10*3/uL (ref 0.0–0.1)
Basophils Relative: 0.6 % (ref 0.0–3.0)
Eosinophils Absolute: 0.3 10*3/uL (ref 0.0–0.7)
Eosinophils Relative: 4.3 % (ref 0.0–5.0)
HCT: 39.6 % (ref 39.0–52.0)
Hemoglobin: 13.1 g/dL (ref 13.0–17.0)
LYMPHS ABS: 1.7 10*3/uL (ref 0.7–4.0)
LYMPHS PCT: 27.4 % (ref 12.0–46.0)
MCHC: 33.1 g/dL (ref 30.0–36.0)
MCV: 92.3 fl (ref 78.0–100.0)
MONOS PCT: 15.9 % — AB (ref 3.0–12.0)
Monocytes Absolute: 1 10*3/uL (ref 0.1–1.0)
NEUTROS PCT: 51.8 % (ref 43.0–77.0)
Neutro Abs: 3.2 10*3/uL (ref 1.4–7.7)
Platelets: 310 10*3/uL (ref 150.0–400.0)
RBC: 4.29 Mil/uL (ref 4.22–5.81)
RDW: 13.2 % (ref 11.5–15.5)
WBC: 6.1 10*3/uL (ref 4.0–10.5)

## 2016-03-29 LAB — URINALYSIS, ROUTINE W REFLEX MICROSCOPIC
BILIRUBIN URINE: NEGATIVE
LEUKOCYTES UA: NEGATIVE
NITRITE: NEGATIVE
RBC / HPF: NONE SEEN (ref 0–?)
Specific Gravity, Urine: 1.02 (ref 1.000–1.030)
TOTAL PROTEIN, URINE-UPE24: 30 — AB
UROBILINOGEN UA: 0.2 (ref 0.0–1.0)
Urine Glucose: NEGATIVE
WBC UA: NONE SEEN (ref 0–?)
pH: 6 (ref 5.0–8.0)

## 2016-03-29 LAB — LIPID PANEL
CHOL/HDL RATIO: 4
Cholesterol: 201 mg/dL — ABNORMAL HIGH (ref 0–200)
HDL: 44.8 mg/dL (ref >39.00)
LDL CALC: 122 mg/dL — AB (ref 0–99)
NONHDL: 155.9
TRIGLYCERIDES: 169 mg/dL — AB (ref 0.0–149.0)
VLDL: 33.8 mg/dL (ref 0.0–40.0)

## 2016-03-29 LAB — TSH: TSH: 0.97 u[IU]/mL (ref 0.35–4.50)

## 2016-03-29 MED ORDER — PREDNISONE 10 MG PO TABS: ORAL_TABLET | ORAL | 0 refills | Status: DC

## 2016-03-29 MED ORDER — LORATADINE 10 MG PO TABS: 10.0000 mg | ORAL_TABLET | Freq: Every day | ORAL | 11 refills | Status: DC

## 2016-03-29 MED ORDER — BUDESONIDE-FORMOTEROL FUMARATE 160-4.5 MCG/ACT IN AERO: 2.0000 | INHALATION_SPRAY | Freq: Two times a day (BID) | RESPIRATORY_TRACT | 0 refills | Status: DC

## 2016-03-29 MED ORDER — HYDROCODONE-HOMATROPINE 5-1.5 MG/5ML PO SYRP: 5.0000 mL | ORAL_SOLUTION | Freq: Every evening | ORAL | 0 refills | Status: DC | PRN

## 2016-03-29 NOTE — Patient Instructions (Addendum)
It is nice to meet you today. CXR today was clear. We willl prescribe a Prednisone taper; 10 mg tablets: 4 tabs x 2 days, 3 tabs x 2 days, 2 tabs x 2 days 1 tab x 2 days then stop. Delsym cough syrup during the day. Hydromet cough syrup 5 cc's  At bedtime for sleep. Please use sugar free Jolly Ranchers for throat soothing. Sips of water for throat soothing. Avoid throat clearing. Add Claritin 10 mg daily for post nasal drip.( Generic is ok) Continue your protonix twice daily as prescribed. Continue your Symbicort twice daily. Remember to rinse after using. Use your Proventil inhaler as needed for break through shortness of breath.( Refills) Follow up with Dr. Vassie LollAlva in 8 weeks. You have an appointment with Rubye Oaksammy Parrett NP for med calendar 05/02/2015. Please contact office for sooner follow up if symptoms do not improve or worsen or seek emergency care

## 2016-03-29 NOTE — Telephone Encounter (Signed)
Spoke with pt's wife. She states that pt is not feeling any better. I have scheduled him for an appointment with SG today at 4:30pm. Nothing further was needed.

## 2016-03-29 NOTE — Assessment & Plan Note (Signed)
Slow to resolve upper airway cough CXR without acute cardiopulmonary process Plan:  Prednisone taper; 10 mg tablets: 4 tabs x 2 days, 3 tabs x 2 days, 2 tabs x 2 days 1 tab x 2 days then stop. Delsym cough syrup during the day. Hydromet cough syrup 5 cc's  At bedtime for sleep. Do not drive if sleepy Please use sugar free Jolly Ranchers for throat soothing. Sips of water for throat soothing.  Avoid throat clearing as this makes cough worse Avoid mint and menthol. Add Claritin 10 mg daily for post nasal drip.( Generic is ok) Continue your protonix twice daily as prescribed. Continue your Symbicort twice daily. Remember to rinse after using. Use your Proventil inhaler as needed for break through shortness of breath.( Refills) Follow up with Dr. Vassie LollAlva in 8 weeks. You have an appointment with Rubye Oaksammy Parrett NP for med calendar 05/02/2015. Please contact office for sooner follow up if symptoms do not improve or worsen or seek emergency care
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
## 2016-04-07 IMAGING — MR MR HEAD W/O CM
10 series · 43 of 48 positions shown · non-contrast
Comparison: None.

CLINICAL DATA: 80-year-old male with high blood pressure and
hyperlipidemia presenting with memory loss. Initial encounter.

EXAM:
MRI HEAD WITHOUT CONTRAST
TECHNIQUE: Multiplanar, multiecho pulse sequences of the brain and surrounding
structures were obtained without intravenous contrast.

[Series 2: T1 · sagittal · 5.0mm · 0.45mm/px · 2 of 20 slices shown]
[im 1/20]
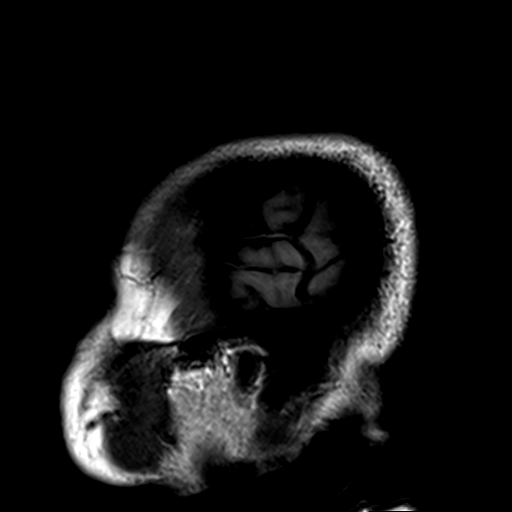
[im 20/20]
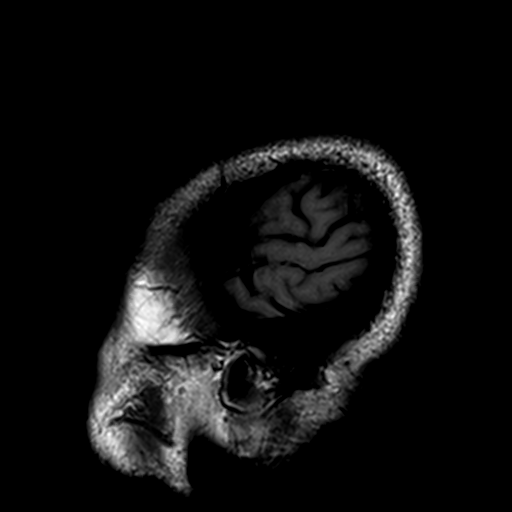

[Series 3: DWI · axial · 5.0mm · 1.80mm/px · z∈[-78,+59]mm · 5 of 44 slices shown (1 of 4)]
[im 1/44]
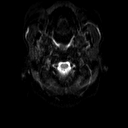
[im 11/44]
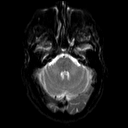
[im 22/44]
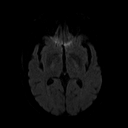
[im 33/44]
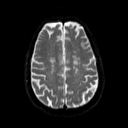
[im 44/44]
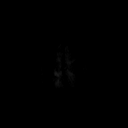

[Series 4: DWI · axial · 5.0mm · 1.80mm/px · z∈[-78,+59]mm · 2 of 20 slices shown (2 of 4)]
[im 1/20]
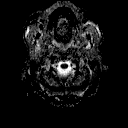
[im 20/20]
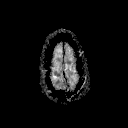

[Series 6: swi_images · axial · 2.0mm · 0.90mm/px · z∈[-88,+70]mm · 8 of 80 slices shown]
[im 1/80]
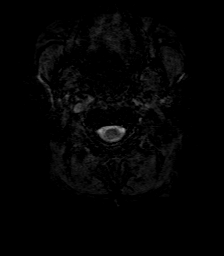
[im 9/80]
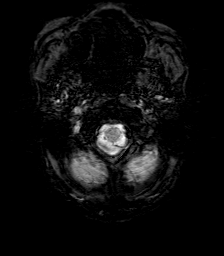
[im 27/80]
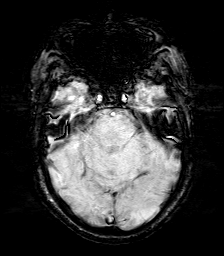
[im 36/80]
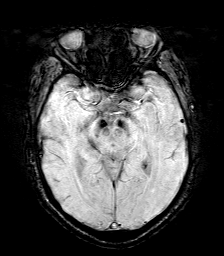
[im 44/80]
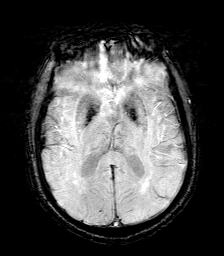
[im 53/80]
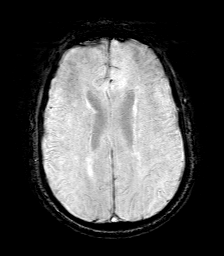
[im 71/80]
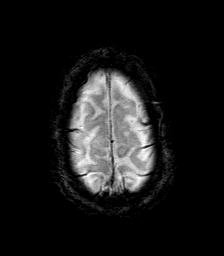
[im 80/80]
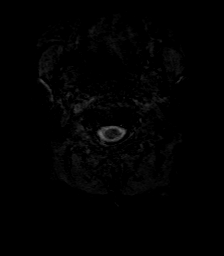

[Series 7: DWI · coronal · 5.0mm · 1.80mm/px · 7 of 60 slices shown (3 of 4)]
[im 1/60]
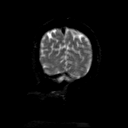
[im 10/60]
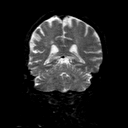
[im 20/60]
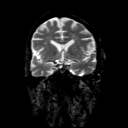
[im 30/60]
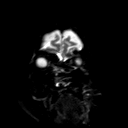
[im 40/60]
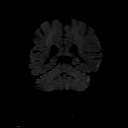
[im 50/60]
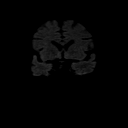
[im 60/60]
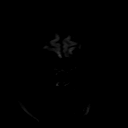

[Series 8: DWI · coronal · 5.0mm · 1.80mm/px · 3 of 29 slices shown (4 of 4)]
[im 1/29]
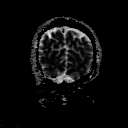
[im 15/29]
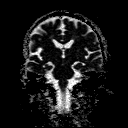
[im 29/29]
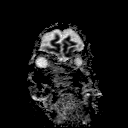

[Series 10: FLAIR · axial · 5.0mm · 0.45mm/px · z∈[-82,+54]mm · 3 of 22 slices shown]
[im 1/22]
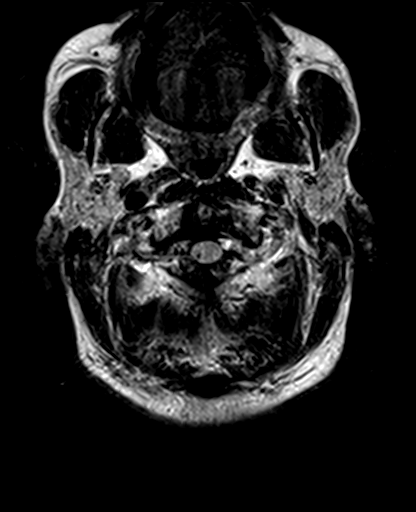
[im 11/22]
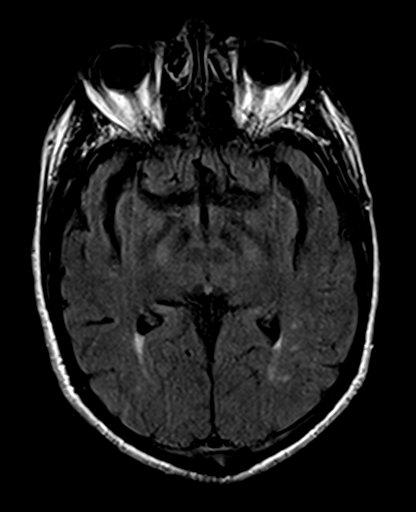
[im 22/22]
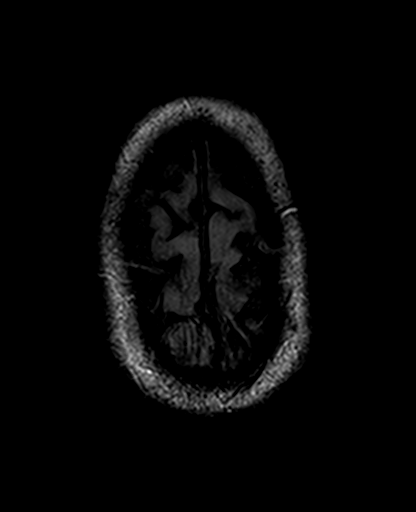

[Series 11: mpr tra · axial · 2.0mm · 0.45mm/px · z∈[-93,+47]mm · 7 of 80 slices shown]
[im 1/80]
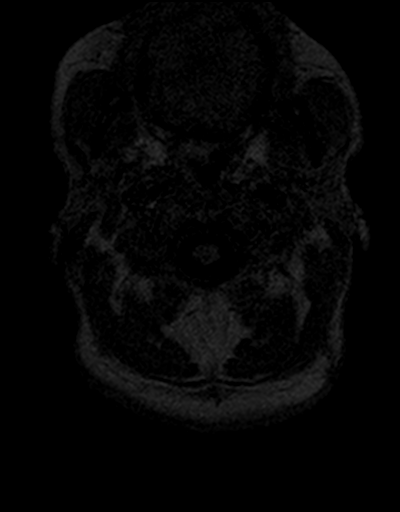
[im 9/80]
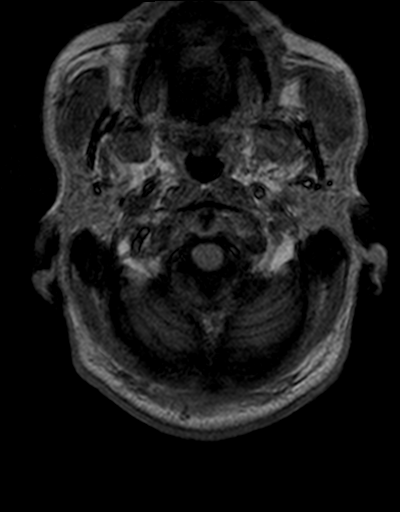
[im 27/80]
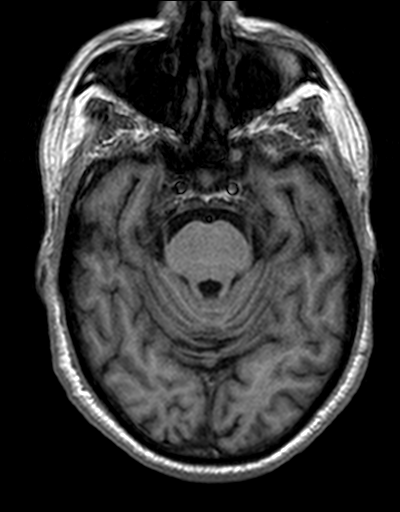
[im 36/80]
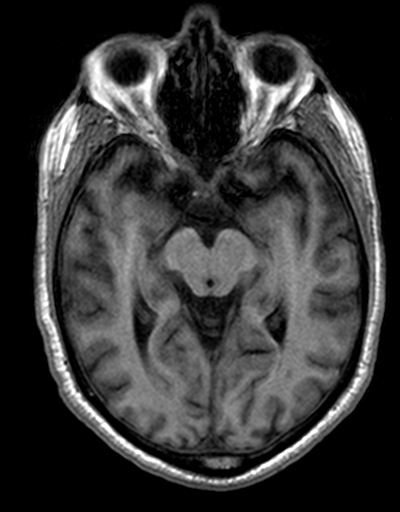
[im 44/80]
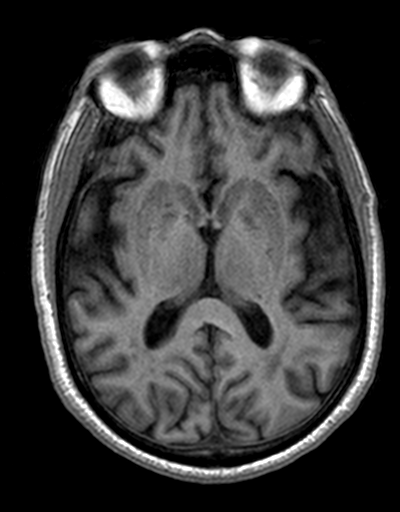
[im 53/80]
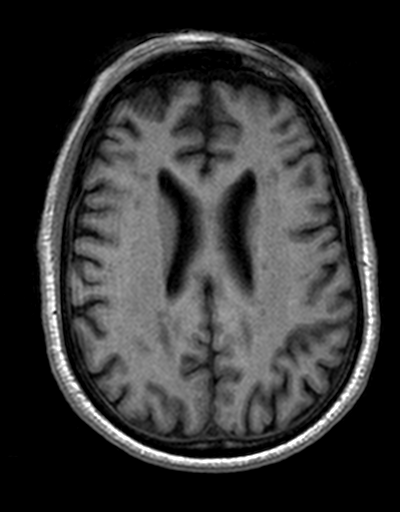
[im 71/80]
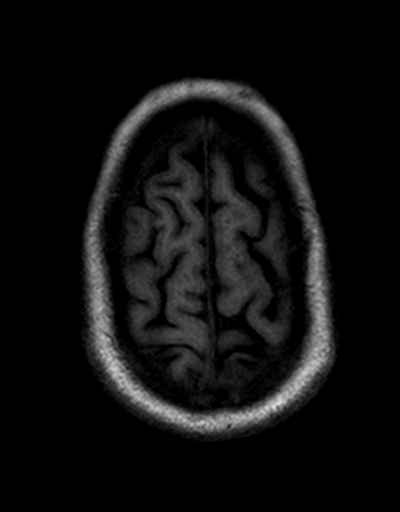

[Series 12: T2 · coronal · 5.0mm · 0.45mm/px · 3 of 24 slices shown (1 of 2)]
[im 1/24]
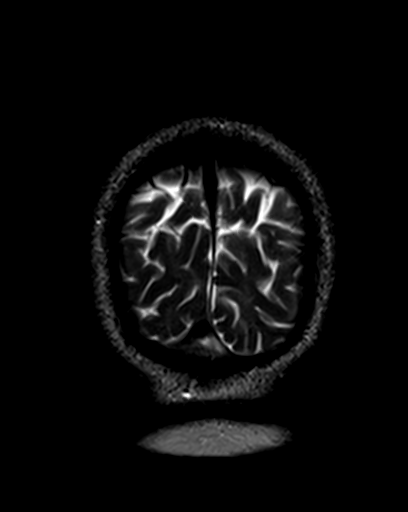
[im 12/24]
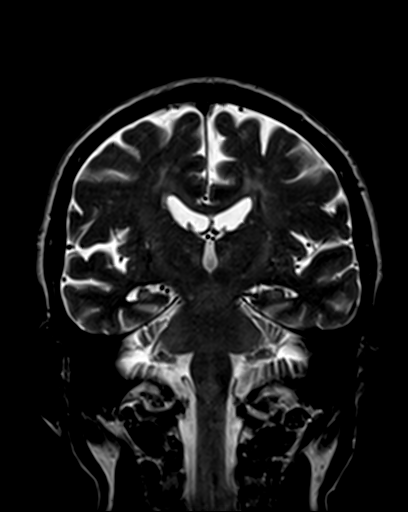
[im 24/24]
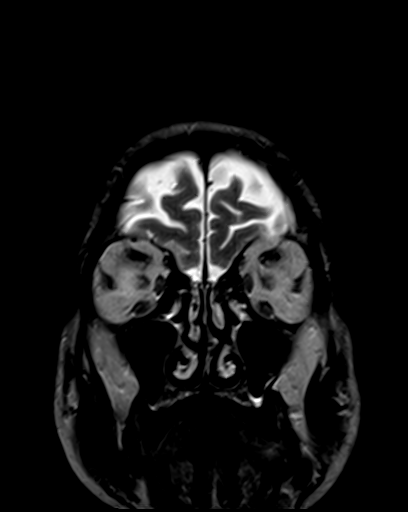

[Series 13: T2 · axial · 5.0mm · 0.45mm/px · z∈[-82,+54]mm · 3 of 22 slices shown (2 of 2)]
[im 1/22]
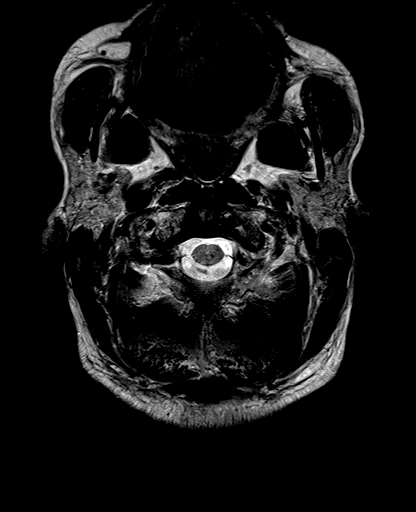
[im 11/22]
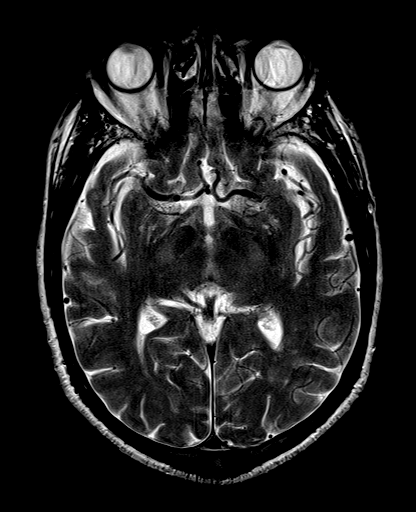
[im 22/22]
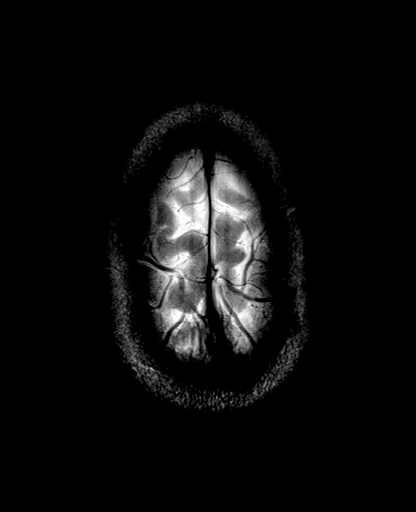

[43 of 48 positions shown; findings below may reference images not displayed]

FINDINGS: No acute infarct.

Prominent mineral deposition globus pallidus, midbrain and dentate
nucleus. This may be normal but has been described with Parkinson's
like syndromes. No other evidence of Parkinson's disease.

Moderate small vessel disease type changes.

No intracranial hemorrhage.

Age-appropriate atrophy without hydrocephalus.

No intracranial mass lesion noted on this unenhanced exam.

Major intracranial vascular structures are patent.

Partially empty sella felt to be incidental. Cervical medullary
junction, pineal region and orbital structures unremarkable.

Opacification left sphenoid sinus with air-fluid level which may
indicate acute sinusitis. Minimal mucosal thickening left maxillary
sinus.
IMPRESSION: Moderate small vessel disease type changes.

Age-appropriate atrophy without hydrocephalus.

Prominent mineral deposition globus pallidus, midbrain and dentate
nucleus. This may be normal but has been described with Parkinson's
like syndromes. No other evidence of Parkinson's disease.

Opacification left sphenoid sinus with air-fluid level which may
indicate acute sinusitis. Minimal mucosal thickening left maxillary
sinus.

## 2016-04-15 ENCOUNTER — Telehealth: Payer: Self-pay | Admitting: Pulmonary Disease

## 2016-04-15 MED ORDER — PREDNISONE 10 MG PO TABS: ORAL_TABLET | ORAL | 0 refills | Status: DC

## 2016-04-15 MED ORDER — AMOXICILLIN-POT CLAVULANATE 875-125 MG PO TABS: 1.0000 | ORAL_TABLET | Freq: Two times a day (BID) | ORAL | 0 refills | Status: AC

## 2016-04-15 NOTE — Telephone Encounter (Signed)
No better since ov / using neb  Prednisone 10 mg take  4 each am x 2 days,   2 each am x 2 days,  1 each am x 2 days and stop Augmentin 875 mg take one pill twice daily  X 10 days - take at breakfast and supper with large glass of water.  It would help reduce the usual side effects (diarrhea and yeast infections) if you ate cultured yogurt at lunch.

## 2016-04-26 ENCOUNTER — Other Ambulatory Visit: Payer: Self-pay | Admitting: *Deleted

## 2016-04-26 MED ORDER — SILDENAFIL CITRATE 100 MG PO TABS: ORAL_TABLET | ORAL | 2 refills | Status: DC

## 2016-04-26 NOTE — Telephone Encounter (Signed)
Rec'd call pt wife requesting refill on husband viagra.Marland Kitchen.Raechel Chute/lmb

## 2016-04-26 NOTE — Telephone Encounter (Signed)
Notified refill sent to [facility].../lmb

## 2016-04-26 NOTE — Telephone Encounter (Signed)
Done erx 

## 2016-04-29 NOTE — Progress Notes (Signed)
Reviewed & agree with plan  

## 2016-05-01 ENCOUNTER — Encounter: Payer: Medicare Other | Admitting: Adult Health

## 2016-05-08 ENCOUNTER — Encounter: Payer: Self-pay | Admitting: Adult Health

## 2016-05-08 ENCOUNTER — Ambulatory Visit (INDEPENDENT_AMBULATORY_CARE_PROVIDER_SITE_OTHER): Payer: PPO | Admitting: Adult Health

## 2016-05-08 DIAGNOSIS — R05 Cough: Secondary | ICD-10-CM

## 2016-05-08 DIAGNOSIS — R058 Other specified cough: Secondary | ICD-10-CM

## 2016-05-08 DIAGNOSIS — J449 Chronic obstructive pulmonary disease, unspecified: Secondary | ICD-10-CM

## 2016-05-08 MED ORDER — ALBUTEROL SULFATE HFA 108 (90 BASE) MCG/ACT IN AERS: 2.0000 | INHALATION_SPRAY | RESPIRATORY_TRACT | 0 refills | Status: DC | PRN

## 2016-05-08 MED ORDER — BUDESONIDE-FORMOTEROL FUMARATE 160-4.5 MCG/ACT IN AERO: INHALATION_SPRAY | RESPIRATORY_TRACT | 0 refills | Status: DC

## 2016-05-08 MED ORDER — ALBUTEROL SULFATE (2.5 MG/3ML) 0.083% IN NEBU: 2.5000 mg | INHALATION_SOLUTION | Freq: Four times a day (QID) | RESPIRATORY_TRACT | 5 refills | Status: DC | PRN

## 2016-05-08 MED ORDER — TIOTROPIUM BROMIDE MONOHYDRATE 1.25 MCG/ACT IN AERS: 2.0000 | INHALATION_SPRAY | Freq: Every day | RESPIRATORY_TRACT | 0 refills | Status: DC

## 2016-05-08 MED ORDER — TIOTROPIUM BROMIDE MONOHYDRATE 2.5 MCG/ACT IN AERS: 2.0000 | INHALATION_SPRAY | Freq: Every day | RESPIRATORY_TRACT | 5 refills | Status: DC

## 2016-05-08 NOTE — Progress Notes (Signed)
@Patient  ID: Alexander [name], male    DOB: Mar 26, 1933, 83 y.o.   MRN: [id number]  Chief Complaint  Patient presents with  . Follow-up    COPD    Referring provider: Corwin Levins, MD  HPI: 83 yo male former smoker (2002) followed for GOLD III COPD   TEST  2014 Spirometry >>FEV1 1.14 - 45% -moderate airway obstruction   05/08/2016 Follow up : COPD /Med review  Pt returns for 1 month  follow up . Was seen last ov with COPD exacerbation , tx w/ pred taper.  He is feeling better with less cough and wheezing  . No longer coughing up green mucus.  Still gets winded easily . Cough is daily but is clear mucus.   We reviewed all his meds and organized them into a med calendar with pt education  Appears to be taking inhalers correctly .  Does admits he does not take crestor, norvasc or asa every day, advised him to discuss this with PCP as compliance is important.    No Known Allergies  Immunization History  Administered Date(s) Administered  . Influenza Split 12/24/2011, 02/23/2015  . Influenza Whole 12/27/2009  . Influenza, High Dose Seasonal PF 01/27/2014, 12/29/2015  . Influenza,inj,Quad PF,36+ Mos 01/09/2013  . Influenza-Unspecified 01/22/2013  . Pneumococcal Polysaccharide-23 05/20/2013  . Tdap 05/06/2015    Past Medical History:  Diagnosis Date  . Asthma   . Constipation   . COPD (chronic obstructive pulmonary disease) (HCC)    chronic dyspnea  . Glaucoma   . Hemorrhoids   . HYPERLIPIDEMIA   . Hypertensive heart disease   . LVH (left ventricular hypertrophy)    a. 2014 Echo: EF 65-70%, no rwma, Gr1 DD, mild MR.  . Non-obstructive CAD (coronary artery disease)    a. 02/2009 Cath: essentially nl cors; b. 07/2014 MV: mild diaph atten, no ischemia-->low risk.    Tobacco History: History  Smoking Status  . Former Smoker  . Packs/day: 0.25  . Years: 48.00  . Types: Cigarettes  . Quit date: 04/24/2000  Smokeless Tobacco  . Never Used   Counseling given: Not  Answered   Outpatient Encounter Prescriptions as of 05/08/2016  Medication Sig  . albuterol (PROAIR HFA) 108 (90 BASE) MCG/ACT inhaler Inhale 2 puffs into the lungs every 4 (four) hours as needed for wheezing or shortness of breath.  Marland Kitchen albuterol (PROVENTIL) (2.5 MG/3ML) 0.083% nebulizer solution Take 3 mLs (2.5 mg total) by nebulization every 6 (six) hours as needed for wheezing or shortness of breath.  Marland Kitchen aspirin 81 MG chewable tablet Chew 1 tablet (81 mg total) by mouth daily.  . budesonide-formoterol (SYMBICORT) 160-4.5 MCG/ACT inhaler INHALE 2 PUFFS BY MOUTH INTO THE LUNGS 2 (TWO) TIMES DAILY.  . budesonide-formoterol (SYMBICORT) 160-4.5 MCG/ACT inhaler Inhale 2 puffs into the lungs 2 (two) times daily.  Marland Kitchen docusate sodium (COLACE) 50 MG capsule Take 50 mg by mouth daily. Reported on 04/10/2015  . guaifenesin (HUMIBID E) 400 MG TABS tablet Take 400 mg by mouth every 4 (four) hours.  . hydrochlorothiazide (MICROZIDE) 12.5 MG capsule TAKE 1 CAPSULE (12.5 MG TOTAL) BY MOUTH EVERY OTHER DAY.  Marland Kitchen HYDROcodone-homatropine (HYCODAN) 5-1.5 MG/5ML syrup Take 5 mLs by mouth at bedtime as needed for cough.  . ILEVRO 0.3 % ophthalmic suspension Apply 1 drop to eye at bedtime.  Marland Kitchen LINZESS 145 MCG CAPS capsule Take 1 tablet by mouth daily before breakfast. Reported on 05/15/2015  . loratadine (CLARITIN) 10 MG tablet Take 1 tablet (  10 mg total) by mouth daily.  . pantoprazole (PROTONIX) 40 MG tablet Take 1 tablet (40 mg total) by mouth daily as needed (for heartburn).  . predniSONE (DELTASONE) 10 MG tablet 40mg X2 days, 30mg  X2 days, 20mg  X2 days, 10mg X2 days, then stop.  . predniSONE (DELTASONE) 10 MG tablet Take  4 each am x 2 days,   2 each am x 2 days,  1 each am x 2 days and stop  . rivastigmine (EXELON) 1.5 MG capsule Take 1 capsule (1.5 mg total) by mouth 2 (two) times daily.  . rosuvastatin (CRESTOR) 10 MG tablet Take 1 tablet (10 mg total) by mouth daily.  . sildenafil (VIAGRA) 100 MG tablet TAKE 0.5-1  TABLETS (50-100 MG TOTAL) BY MOUTH DAILY AS NEEDED FOR ERECTILE DYSFUNCTION.  Marland Kitchen. triamcinolone ointment (KENALOG) 0.5 % Apply 1 application topically 2 (two) times daily.   No facility-administered encounter medications on file as of 05/08/2016.      Review of Systems  Constitutional:   No  weight loss, night sweats,  Fevers, chills, + fatigue, or  lassitude.  HEENT:   No headaches,  Difficulty swallowing,  Tooth/dental problems, or  Sore throat,                No sneezing, itching, ear ache, nasal congestion, post nasal drip,   CV:  No chest pain,  Orthopnea, PND, swelling in lower extremities, anasarca, dizziness, palpitations, syncope.   GI  No heartburn, indigestion, abdominal pain, nausea, vomiting, diarrhea, change in bowel habits, loss of appetite, bloody stools.   Resp:   No chest wall deformity  Skin: no rash or lesions.  GU: no dysuria, change in color of urine, no urgency or frequency.  No flank pain, no hematuria   MS:  No joint pain or swelling.  No decreased range of motion.  No back pain.    Physical Exam  BP 122/70   Pulse 82   Temp 98.3 F (36.8 C) (Oral)   Ht 5\' 7"  (1.702 m)   Wt 182 lb 12.8 oz (82.9 kg)   SpO2 92%   BMI 28.63 kg/m   GEN: A/Ox3; pleasant , NAD, elderly    HEENT:  [place]/AT,  EACs-clear, TMs-wnl, NOSE-clear, THROAT-clear, no lesions, no postnasal drip or exudate noted.   NECK:  Supple w/ fair ROM; no JVD; normal carotid impulses w/o bruits; no thyromegaly or nodules palpated; no lymphadenopathy.    RESP  Decreased BS in bases , . no accessory muscle use, no dullness to percussion  CARD:  RRR, no m/r/g, tr peripheral edema, pulses intact, no cyanosis or clubbing.  GI:   Soft & nt; nml bowel sounds; no organomegaly or masses detected.   Musco: Warm bil, no deformities or joint swelling noted.   Neuro: alert, no focal deficits noted.    Skin: Warm, no lesions or rashes  Psych:  No change in mood or affect. No depression or anxiety.   No memory loss.  Lab Results:  CBC    Component Value Date/Time   WBC 6.1 03/29/2016 1551   RBC 4.29 03/29/2016 1551   HGB 13.1 03/29/2016 1551   HCT 39.6 03/29/2016 1551   PLT 310.0 03/29/2016 1551   MCV 92.3 03/29/2016 1551   MCV 97.4 (A) 12/02/2012 1714   MCH 30.5 04/08/2015 1010   MCHC 33.1 03/29/2016 1551   RDW 13.2 03/29/2016 1551   LYMPHSABS 1.7 03/29/2016 1551   MONOABS 1.0 03/29/2016 1551   EOSABS 0.3 03/29/2016 1551  BASOSABS 0.0 03/29/2016 1551    BMET    Component Value Date/Time   NA 143 03/29/2016 1551   K 4.9 03/29/2016 1551   CL 105 03/29/2016 1551   CO2 33 (H) 03/29/2016 1551   GLUCOSE 111 (H) 03/29/2016 1551   BUN 15 03/29/2016 1551   CREATININE 1.25 03/29/2016 1551   CREATININE 0.99 07/29/2014 0814   CALCIUM 9.5 03/29/2016 1551   GFRNONAA 52 (L) 04/08/2015 0918   GFRAA 60 (L) 04/08/2015 0918    BNP    Component Value Date/Time   BNP 30.2 04/08/2015 1010    ProBNP    Component Value Date/Time   PROBNP 64.3 02/28/2013 1224    Imaging: No results found.   Assessment & Plan:   COPD GOLD III Frequent exacerbations  Add Spiriva   Plan  . Patient Instructions  Add Spiriva 2 puff daily  Continue on Symbicort 2 puffs Twice daily  , rinse after use.  Follow med calendar closely and bring to each visit .  follow up Dr. Sherene Sires  In 2-3 months and As needed       Upper airway cough syndrome Recent flare improved after steroid Cont w/ trigger control   Plan  Patient Instructions  Add Spiriva 2 puff daily  Continue on Symbicort 2 puffs Twice daily  , rinse after use.  Follow med calendar closely and bring to each visit .  follow up Dr. Sherene Sires  In 2-3 months and As needed            Rubye Oaks, NP 05/08/2016

## 2016-05-08 NOTE — Progress Notes (Signed)
Chart and office note reviewed in detail  > agree with a/p as outlined    

## 2016-05-08 NOTE — Assessment & Plan Note (Signed)
Frequent exacerbations  Add Spiriva   Plan  . Patient Instructions  Add Spiriva 2 puff daily  Continue on Symbicort 2 puffs Twice daily  , rinse after use.  Follow med calendar closely and bring to each visit .  follow up Dr. Sherene SiresWert  In 2-3 months and As needed

## 2016-05-08 NOTE — Addendum Note (Signed)
Addended by: Abigail MiyamotoPHELPS, Erynne Kealey D on: 05/08/2016 10:29 AM   Modules accepted: Orders

## 2016-05-08 NOTE — Assessment & Plan Note (Signed)
Recent flare improved after steroid Cont w/ trigger control   Plan  Patient Instructions  Add Spiriva 2 puff daily  Continue on Symbicort 2 puffs Twice daily  , rinse after use.  Follow med calendar closely and bring to each visit .  follow up Dr. Sherene SiresWert  In 2-3 months and As needed

## 2016-05-08 NOTE — Patient Instructions (Signed)
Add Spiriva 2 puff daily  Continue on Symbicort 2 puffs Twice daily  , rinse after use.  Follow med calendar closely and bring to each visit .  follow up Dr. Sherene SiresWert  In 2-3 months and As needed

## 2016-05-09 ENCOUNTER — Encounter: Payer: Self-pay | Admitting: Internal Medicine

## 2016-05-09 ENCOUNTER — Ambulatory Visit (INDEPENDENT_AMBULATORY_CARE_PROVIDER_SITE_OTHER): Payer: PPO | Admitting: Internal Medicine

## 2016-05-09 VITALS — BP 138/80 | HR 90 | Temp 98.1°F | Resp 20 | Wt 181.0 lb

## 2016-05-09 DIAGNOSIS — R739 Hyperglycemia, unspecified: Secondary | ICD-10-CM

## 2016-05-09 DIAGNOSIS — Z23 Encounter for immunization: Secondary | ICD-10-CM

## 2016-05-09 DIAGNOSIS — I10 Essential (primary) hypertension: Secondary | ICD-10-CM

## 2016-05-09 DIAGNOSIS — J309 Allergic rhinitis, unspecified: Secondary | ICD-10-CM | POA: Diagnosis not present

## 2016-05-09 DIAGNOSIS — F32A Depression, unspecified: Secondary | ICD-10-CM | POA: Insufficient documentation

## 2016-05-09 DIAGNOSIS — J310 Chronic rhinitis: Secondary | ICD-10-CM | POA: Insufficient documentation

## 2016-05-09 DIAGNOSIS — F329 Major depressive disorder, single episode, unspecified: Secondary | ICD-10-CM

## 2016-05-09 DIAGNOSIS — Z0001 Encounter for general adult medical examination with abnormal findings: Secondary | ICD-10-CM | POA: Diagnosis not present

## 2016-05-09 DIAGNOSIS — E785 Hyperlipidemia, unspecified: Secondary | ICD-10-CM | POA: Diagnosis not present

## 2016-05-09 MED ORDER — CITALOPRAM HYDROBROMIDE 10 MG PO TABS: 10.0000 mg | ORAL_TABLET | Freq: Every day | ORAL | 3 refills | Status: DC

## 2016-05-09 MED ORDER — ROSUVASTATIN CALCIUM 10 MG PO TABS: 10.0000 mg | ORAL_TABLET | Freq: Every day | ORAL | 3 refills | Status: DC
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
## 2016-05-09 NOTE — Patient Instructions (Addendum)
You had the prevnar 13 pneumonia shot  Please take all new medication as prescribed - the celexa 10 mg per day for depression  You should also consider taking the OTC claritin 10 mg, nasacort AQ, and mucinex for the left ear symptoms  Please take all of your medications every day, including the crestor  Please continue all other medications as before, and refills have been done if requested.  Please have the pharmacy call with any other refills you may need.  Please continue your efforts at being more active, low cholesterol diet, and weight control.  You are otherwise up to date with prevention measures today.  Please keep your appointments with your specialists as you may have planned  Please return in 6 months, or sooner if needed, with Lab testing done 3-5 days before

## 2016-05-09 NOTE — Progress Notes (Signed)
Pre visit review using our clinic review tool, if applicable. No additional management support is needed unless otherwise documented below in the visit note. 

## 2016-05-09 NOTE — Assessment & Plan Note (Signed)
Asympt, for a1c next labs

## 2016-05-09 NOTE — Assessment & Plan Note (Signed)
stable overall by history and exam, recent data reviewed with pt, and pt to continue medical treatment as before,  to f/u any worsening symptoms or concerns BP Readings from Last 3 Encounters:  05/09/16 138/80  05/08/16 122/70  03/29/16 134/72

## 2016-05-09 NOTE — Assessment & Plan Note (Signed)

## 2016-05-09 NOTE — Assessment & Plan Note (Signed)
Encouraged compliance with diet and meds, wife to help with med admin, o/w stable overall by history and exam, recent data reviewed with pt, and pt to continue medical treatment as before,  to f/u any worsening symptoms or concerns Lab Results  Component Value Date   LDLCALC 122 (H) 03/29/2016

## 2016-05-10 NOTE — Assessment & Plan Note (Signed)
Mild to mod, for otc claritin and nasacort with mucinex prn as well left ear congestion,  to f/u any worsening symptoms or concerns

## 2016-05-10 NOTE — Assessment & Plan Note (Addendum)
Mild to mod, verified no SI or HI, declines counseling, for celexa 10 qd,  to f/u any worsening symptoms or concerns   In addition to the time spent performing CPE, I spent an additional 25 minutes face to face,in which greater than 50% of this time was spent in counseling and coordination of care for patient's acute illness as documented.

## 2016-05-12 NOTE — Addendum Note (Signed)
Addended by: Abigail MiyamotoPHELPS, DENISE D on: 05/12/2016 04:23 PM   Modules accepted: Orders

## 2016-05-16 ENCOUNTER — Telehealth: Payer: Self-pay | Admitting: Internal Medicine

## 2016-05-16 NOTE — Telephone Encounter (Signed)
Patient Name: Alexander FillerHARRY Nelms DOB: 1932/11/28 Initial Comment Caller states her husband takes crestor, has side pain after taking Nurse Assessment Nurse: Lane HackerHarley, RN, Elvin SoWindy Date/Time (Eastern Time): 05/16/2016 11:58:04 AM Confirm and document reason for call. If symptomatic, describe symptoms. ---Caller states her husband takes Crestor, has left abdominal side pain, up under the left arm, after taking the med. Happened again this am at 6, lasts about an hr. No pain now. No CP now. - Last seen by MD on 16th and didn't have the problem then. Does the patient have any new or worsening symptoms? ---Yes Will a triage be completed? ---Yes Related visit to physician within the last 2 weeks? ---No Does the PT have any chronic conditions? (i.e. diabetes, asthma, etc.) ---Yes List chronic conditions. ---COPD Is this a behavioral health or substance abuse call? ---No Guidelines Guideline Title Affirmed Question Affirmed Notes COPD Post-Hospitalization Follow-up Call [1] MILD difficulty breathing (e.g., minimal/no SOB at rest, SOB with walking) AND [2] worse than when discharged from hospital Final Disposition User See Physician within 24 Hours [place] WestHarley, RN, Elvin SoWindy Comments [facility] - 4 pm tomorrow appt made by office. Referrals REFERRED TO PCP OFFICE Disagree/Comply: Comply

## 2016-05-17 ENCOUNTER — Ambulatory Visit: Payer: Medicare Other | Admitting: Nurse Practitioner

## 2016-05-18 ENCOUNTER — Encounter: Payer: Self-pay | Admitting: Internal Medicine

## 2016-05-18 ENCOUNTER — Telehealth: Payer: Self-pay | Admitting: Internal Medicine

## 2016-05-18 ENCOUNTER — Ambulatory Visit (INDEPENDENT_AMBULATORY_CARE_PROVIDER_SITE_OTHER): Payer: PPO | Admitting: Internal Medicine

## 2016-05-18 VITALS — BP 140/80 | HR 91 | Resp 20 | Wt 185.0 lb

## 2016-05-18 DIAGNOSIS — R079 Chest pain, unspecified: Secondary | ICD-10-CM

## 2016-05-18 DIAGNOSIS — I10 Essential (primary) hypertension: Secondary | ICD-10-CM | POA: Diagnosis not present

## 2016-05-18 DIAGNOSIS — E785 Hyperlipidemia, unspecified: Secondary | ICD-10-CM

## 2016-05-18 MED ORDER — ROSUVASTATIN CALCIUM 10 MG PO TABS: 10.0000 mg | ORAL_TABLET | Freq: Every day | ORAL | 3 refills | Status: DC

## 2016-05-18 NOTE — Progress Notes (Signed)
Pre visit review using our clinic review tool, if applicable. No additional management support is needed unless otherwise documented below in the visit note. 
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
## 2016-05-18 NOTE — Telephone Encounter (Signed)
Ok but there are no guarantees  I expect this to be likely not approved  Pt wants to change crestor to brand name due to left chest pain at night for last 3 nights

## 2016-05-18 NOTE — Telephone Encounter (Signed)
Pt called stating Crestor copay is $200. They were wondering if we can PA start on this to help with copay. Please advise.

## 2016-05-18 NOTE — Patient Instructions (Signed)
OK to change the generic crestor to Brand Name - a new prescipriton has been sent  Please continue all other medications as before, and refills have been done if requested.  Please have the pharmacy call with any other refills you may need.  Please keep your appointments with your specialists as you may have planned

## 2016-05-19 NOTE — Telephone Encounter (Signed)
PA was denied. Insurance will not cover. Called patient to advise.  Key [id number]

## 2016-05-21 DIAGNOSIS — R079 Chest pain, unspecified: Secondary | ICD-10-CM | POA: Insufficient documentation

## 2016-05-21 NOTE — Assessment & Plan Note (Addendum)
Atypical, declines cxr or other evaluation at this time, exam benign, asked to f/u if recurs

## 2016-05-21 NOTE — Assessment & Plan Note (Signed)
stable overall by history and exam, recent data reviewed with pt, and pt to continue medical treatment as before,  to f/u any worsening symptoms or concerns BP Readings from Last 3 Encounters:  05/18/16 140/80  05/09/16 138/80  05/08/16 122/70

## 2016-05-21 NOTE — Assessment & Plan Note (Signed)
Will defer to pt, to try to change the crestor to brand name, though not clear if insurance will cover

## 2016-05-31 ENCOUNTER — Ambulatory Visit (INDEPENDENT_AMBULATORY_CARE_PROVIDER_SITE_OTHER): Payer: PPO | Admitting: Neurology

## 2016-05-31 ENCOUNTER — Encounter: Payer: Self-pay | Admitting: Neurology

## 2016-05-31 VITALS — BP 128/80 | HR 82 | Ht 67.0 in | Wt 180.2 lb

## 2016-05-31 DIAGNOSIS — F0390 Unspecified dementia without behavioral disturbance: Secondary | ICD-10-CM

## 2016-05-31 DIAGNOSIS — F03B0 Unspecified dementia, moderate, without behavioral disturbance, psychotic disturbance, mood disturbance, and anxiety: Secondary | ICD-10-CM

## 2016-05-31 MED ORDER — RIVASTIGMINE TARTRATE 1.5 MG PO CAPS: 1.5000 mg | ORAL_CAPSULE | Freq: Two times a day (BID) | ORAL | 11 refills | Status: DC

## 2016-05-31 NOTE — Progress Notes (Signed)
NEUROLOGY FOLLOW UP OFFICE NOTE  PEREZ DIRICO [id number]  HISTORY OF PRESENT ILLNESS: I had the pleasure of seeing Alexander [name] in follow-up in the neurology clinic on 05/31/2016.  The patient was last seen 6 months ago for mild dementia. MMSE in August 2017 was 18/28 (illiterate; 19/28 in March 2016). He is again accompanied by his wife who helps supplement the history today. On his last visit, his daughter had called to report he got lost driving and was forgetting a lot of things. He had side effects on Aricept, and was started on Rivastigmine. He had dizziness on BID dosing, and went down to Rivastigmine 1.5mg  qhs, which he is tolerating better. His wife feels his memory is "pretty good, much better." He feels it is better as well. He denies getting lost driving, his wife states she has no concerns with his driving. They both report he is good with remembering his medications. His wife is in charge of bills. No difficulties with ADLs. He has occasional headaches. He denies any diplopia, dysarthria, dysphagia, neck/back pain, focal numbness/tingling/weakness, anosmia, or tremors.  HPI: This is a pleasant 83 yo RH man with a history of hypertension, hyperlipidemia,with worsening memory loss. When asked about his memory, he states "there ain't none." He noticed memory changes in his late 81s, he would forget names, however recently he would be talking about something and forget what he was talking about. He misplaces things frequently. He got lost driving one time. He was picking his wife up one time and did not know where he was (15 years ago). He occasionally forgets to take his medications and has to be told repeatedly to take it. He has noticed word-finding difficulties. No problems performing ADLs independently. He lives with his wife.  His daughter has noticed changes more in the past year or so. She would tell him something then the next minute he forgets. He asks the same questions  repeatedly. At the end of the visit, his daughter walked out to speak with me personally, and stated that her father underreported symptoms, and that she and her mother have been concerned about his driving. She did not want to mention this in front of him because he would get upset. She denies any paranoia. He states he sees things moving around especially at night, like shadows, sometimes he thinks something is crawling around on the fence. He has constipation.  His father had memory loss.   I personally reviewed MRI brain done in 01/2014 which showed prominent mineral deposition in the globus pallidus, midbrain, and dentate nucleus. This may be normal but has been described in Parkinson's like syndromes. Moderate chronic microvascular change.  PAST MEDICAL HISTORY: Past Medical History:  Diagnosis Date  . Asthma   . Constipation   . COPD (chronic obstructive pulmonary disease) (HCC)    chronic dyspnea  . Glaucoma   . Hemorrhoids   . HYPERLIPIDEMIA   . Hypertensive heart disease   . LVH (left ventricular hypertrophy)    a. 2014 Echo: EF 65-70%, no rwma, Gr1 DD, mild MR.  . Non-obstructive CAD (coronary artery disease)    a. 02/2009 Cath: essentially nl cors; b. 07/2014 MV: mild diaph atten, no ischemia-->low risk.    MEDICATIONS: Current Outpatient Prescriptions on File Prior to Visit  Medication Sig Dispense Refill  . albuterol (PROAIR HFA) 108 (90 Base) MCG/ACT inhaler Inhale 2 puffs into the lungs every 4 (four) hours as needed for wheezing or shortness of breath. 1 Inhaler 0  .  albuterol (PROVENTIL) (2.5 MG/3ML) 0.083% nebulizer solution Take 3 mLs (2.5 mg total) by nebulization every 6 (six) hours as needed for wheezing or shortness of breath. 75 mL 5  . amLODipine (NORVASC) 2.5 MG tablet Take 2.5 mg by mouth daily.    Marland Kitchen. aspirin 81 MG chewable tablet Chew 1 tablet (81 mg total) by mouth daily. 30 tablet 0  . budesonide-formoterol (SYMBICORT) 160-4.5 MCG/ACT inhaler INHALE 2 PUFFS  BY MOUTH INTO THE LUNGS 2 (TWO) TIMES DAILY. 10.2 Inhaler 0  . citalopram (CELEXA) 10 MG tablet Take 1 tablet (10 mg total) by mouth daily. 90 tablet 3  . diphenhydramine-acetaminophen (TYLENOL PM) 25-500 MG TABS tablet Take 1 tablet by mouth at bedtime as needed.    . docusate sodium (COLACE) 50 MG capsule Take 50 mg by mouth daily. Reported on 04/10/2015    . famotidine (PEPCID) 40 MG tablet Take 40 mg by mouth at bedtime.    Marland Kitchen. guaifenesin (HUMIBID E) 400 MG TABS tablet Take 400 mg by mouth every 4 (four) hours.    . hydrochlorothiazide (MICROZIDE) 12.5 MG capsule TAKE 1 CAPSULE (12.5 MG TOTAL) BY MOUTH EVERY OTHER DAY. (Patient taking differently: Take 2 capsule every day) 45 capsule 2  . LINZESS 145 MCG CAPS capsule Take 1 tablet by mouth daily before breakfast. Reported on 05/15/2015  10  . pantoprazole (PROTONIX) 40 MG tablet Take 1 tablet (40 mg total) by mouth daily as needed (for heartburn). 15 tablet 0  . rivastigmine (EXELON) 1.5 MG capsule Take 1 capsule (1.5 mg total) by mouth 2 (two) times daily. 60 capsule 11  . rosuvastatin (CRESTOR) 10 MG tablet Take 1 tablet (10 mg total) by mouth daily. 90 tablet 3  . Tiotropium Bromide Monohydrate (SPIRIVA RESPIMAT) 2.5 MCG/ACT AERS Inhale 2 puffs into the lungs daily. 4 g 5   No current facility-administered medications on file prior to visit.     ALLERGIES: No Known Allergies  FAMILY HISTORY: Family History  Problem Relation Age of Onset  . Pancreatic cancer Mother [id number]  . Hypertension Mother   . Aneurysm Father 74    brain  . Prostate cancer Brother     x 2 bro    SOCIAL HISTORY: Social History   Social History  . Marital status: Married    Spouse name: N/A  . Number of children: 8  . Years of education: 8   Occupational History  . RETIRED Retired    Location managerMACHINE OPERATOR   Social History Main Topics  . Smoking status: Former Smoker    Packs/day: 0.25    Years: 48.00    Types: Cigarettes    Quit date: 04/24/2000  .  Smokeless tobacco: Never Used  . Alcohol use No  . Drug use: No  . Sexual activity: Yes   Other Topics Concern  . Not on file   Social History Narrative   EXERCISES INTERMITTENTLY AT THE GYM      5 GRANDCHILDREN      2 GREAT-CHILDREN            WEARS GLASSES      Lives in one story home with wife.    REVIEW OF SYSTEMS: Constitutional: No fevers, chills, or sweats, no generalized fatigue, change in appetite Eyes: No visual changes, double vision, eye pain Ear, nose and throat: No hearing loss, ear pain, nasal congestion, sore throat Cardiovascular: No chest pain, palpitations Respiratory:  No shortness of breath at rest or with exertion, wheezes GastrointestinaI: No nausea, vomiting, diarrhea,  abdominal pain, fecal incontinence Genitourinary:  No dysuria, urinary retention or frequency Musculoskeletal:  No neck pain, back pain Integumentary: No rash, pruritus, skin lesions Neurological: as above Psychiatric: No depression, insomnia, anxiety Endocrine: No palpitations, fatigue, diaphoresis, mood swings, change in appetite, change in weight, increased thirst Hematologic/Lymphatic:  No anemia, purpura, petechiae. Allergic/Immunologic: no itchy/runny eyes, nasal congestion, recent allergic reactions, rashes  PHYSICAL EXAM: Vitals:   05/31/16 0909  BP: 128/80  Pulse: 82   General: No acute distress Head:  Normocephalic/atraumatic Neck: supple, no paraspinal tenderness, full range of motion Heart:  Regular rate and rhythm Lungs:  Clear to auscultation bilaterally Back: No paraspinal tenderness Skin/Extremities: No rash, no edema Neurological Exam: alert and oriented to person, place, and season. No aphasia or dysarthria. Fund of knowledge is appropriate.  Recent and remote memory are impaired.  Attention and concentration are normal.    Able to name objects and repeat phrases. CDT 5/5  MMSE - Mini Mental State Exam 05/31/2016 11/25/2015 07/10/2014  Orientation to time 1 3 3     Orientation to Place 5 5 4   Registration 3 3 3   Attention/ Calculation 1 0 1  Recall 0 0 1  Language- name 2 objects 2 2 2   Language- repeat 1 1 1   Language- follow 3 step command 3 3 3   Language- read & follow direction 0 (illiterate, can read each letter) 0 0  Write a sentence 0 (illiterate) 0 0  Copy design 1 1 1   Total score 17/28 18 19    Cranial nerves: Pupils equal, round, reactive to light.Extraocular movements intact with no nystagmus. Visual fields full. Facial sensation intact. No facial asymmetry. Tongue, uvula, palate midline.  Motor: Bulk and tone normal, muscle strength 5/5 throughout with no pronator drift.  Sensation to light touch intact.  No extinction to double simultaneous stimulation.  Deep tendon reflexes 2+ throughout, toes downgoing.  Finger to nose testing intact.  Gait narrow-based and steady, able to tandem walk adequately.  Romberg negative.  IMPRESSION: This is an 83 yo RH man with vascular risk factors including hypertension, hyperlipidemia, and dementia. MMSE today 17/29 (patient illiterate, 18/28 in August 2017, 19/28 in March 2016). There has been a gradual decline since his initial visit. He is tolerating Exelon 1.5mg  daily, and will again try increasing to BID dosing. His daughter had previously expressed concern about driving, he and his wife today deny any driving concerns, however I discussed that with results of MMSE testing, he should not be driving. He was resistant and I recommended a driving evaluation, which he does not think he needs. I advised that his wife be with him at all times when he drives and to monitor driving, stop if any further worsening. The importance of physical exercise and brain stimulation exercises, control of vascular risk factors, for brain health were again discussed. He will follow-up in 1 year and knows to call for any problems with the medication.  Thank you for allowing me to participate in his care.  Please do not hesitate to  call for any questions or concerns.  The duration of this appointment visit was 25 minutes of face-to-face time with the patient.  Greater than 50% of this time was spent in counseling, explanation of diagnosis, planning of further management, and coordination of care.   Patrcia Dolly, M.D.   CC: Dr. Oliver Barre

## 2016-05-31 NOTE — Patient Instructions (Signed)
1. Increase Exelon 1.5mg  to twice a day 2. Recommend driving evaluation with the DMV. Always have someone in him in the car if driving. 3. Control of blood pressure, cholesterol, as well as physical exercise and brain stimulation exercises are important for brain health. 4. Follow-up in 1 year, call for any changes

## 2016-06-06 ENCOUNTER — Ambulatory Visit: Payer: Medicare Other | Admitting: Pulmonary Disease

## 2016-06-21 ENCOUNTER — Telehealth: Payer: Self-pay | Admitting: Internal Medicine

## 2016-06-21 NOTE — Telephone Encounter (Signed)
Pt wife called in an said that pt is totally out of charlestrol meds.  She said that Dr Jonny Ruizjohn was changing his meds from Crestor to a different med.

## 2016-06-22 MED ORDER — ROSUVASTATIN CALCIUM 10 MG PO TABS: 10.0000 mg | ORAL_TABLET | Freq: Every day | ORAL | 1 refills | Status: DC

## 2016-06-22 NOTE — Telephone Encounter (Signed)
Rec'd fax stating patient is requesting generic crestor now the brand name is too expensive. Requesting new script for Rosuvastatin. Updated med list sent rx to [facility].../lmb

## 2016-07-06 ENCOUNTER — Ambulatory Visit: Payer: Medicare Other | Admitting: Internal Medicine

## 2016-07-13 DIAGNOSIS — K625 Hemorrhage of anus and rectum: Secondary | ICD-10-CM | POA: Diagnosis not present

## 2016-07-13 DIAGNOSIS — K219 Gastro-esophageal reflux disease without esophagitis: Secondary | ICD-10-CM | POA: Diagnosis not present

## 2016-07-13 DIAGNOSIS — R140 Abdominal distension (gaseous): Secondary | ICD-10-CM | POA: Diagnosis not present

## 2016-07-13 DIAGNOSIS — K5904 Chronic idiopathic constipation: Secondary | ICD-10-CM | POA: Diagnosis not present

## 2016-08-04 ENCOUNTER — Other Ambulatory Visit: Payer: Self-pay | Admitting: Internal Medicine

## 2016-08-09 ENCOUNTER — Telehealth: Payer: Self-pay | Admitting: Acute Care

## 2016-08-09 NOTE — Telephone Encounter (Signed)
Pt's spouse is aware of MW's recommendations. Pt states he will try delsym, and will contact us for an apt if he doesn't have any relief. nothing further needed.

## 2016-08-09 NOTE — Telephone Encounter (Signed)
Strongest cough med = delsym and if that's not strong enough ov with all meds in hand  No more phone care > to UC if worsens awaiting ov

## 2016-08-09 NOTE — Telephone Encounter (Signed)
Spoke with pt's wife [name] (dpr on file), requesting refill on Hycodan.  Wishes to pick up rx from office. Last refill: 03/29/2016 #260mL with 0 refills Last ov: 05/08/16 with TP Next ov: none- pt cancelled 2/13 rov with RA and no-showed 3/15 OV with MW.  MW please advise on refill request.  Thanks!   05/08/16 AVS: Patient Instructions  Add Spiriva 2 puff daily  Continue on Symbicort 2 puffs Twice daily  , rinse after use.  Follow med calendar closely and bring to each visit .  follow up Dr. Sherene Sires  In 2-3 months and As needed

## 2016-08-30 ENCOUNTER — Encounter: Payer: Self-pay | Admitting: Internal Medicine

## 2016-08-30 DIAGNOSIS — H04123 Dry eye syndrome of bilateral lacrimal glands: Secondary | ICD-10-CM | POA: Diagnosis not present

## 2016-08-30 DIAGNOSIS — H3589 Other specified retinal disorders: Secondary | ICD-10-CM | POA: Diagnosis not present

## 2016-08-30 DIAGNOSIS — H35033 Hypertensive retinopathy, bilateral: Secondary | ICD-10-CM | POA: Diagnosis not present

## 2016-08-30 DIAGNOSIS — H35353 Cystoid macular degeneration, bilateral: Secondary | ICD-10-CM | POA: Diagnosis not present

## 2016-08-30 DIAGNOSIS — H59031 Cystoid macular edema following cataract surgery, right eye: Secondary | ICD-10-CM | POA: Diagnosis not present

## 2016-08-30 DIAGNOSIS — H11423 Conjunctival edema, bilateral: Secondary | ICD-10-CM | POA: Diagnosis not present

## 2016-08-30 DIAGNOSIS — H35372 Puckering of macula, left eye: Secondary | ICD-10-CM | POA: Diagnosis not present

## 2016-08-30 DIAGNOSIS — Z9849 Cataract extraction status, unspecified eye: Secondary | ICD-10-CM | POA: Diagnosis not present

## 2016-08-30 DIAGNOSIS — Z961 Presence of intraocular lens: Secondary | ICD-10-CM | POA: Diagnosis not present

## 2016-08-30 DIAGNOSIS — H40003 Preglaucoma, unspecified, bilateral: Secondary | ICD-10-CM | POA: Diagnosis not present

## 2016-08-30 DIAGNOSIS — H18413 Arcus senilis, bilateral: Secondary | ICD-10-CM | POA: Diagnosis not present

## 2016-08-30 DIAGNOSIS — H40023 Open angle with borderline findings, high risk, bilateral: Secondary | ICD-10-CM | POA: Diagnosis not present

## 2016-09-04 ENCOUNTER — Encounter (INDEPENDENT_AMBULATORY_CARE_PROVIDER_SITE_OTHER): Payer: PPO | Admitting: Ophthalmology

## 2016-09-04 DIAGNOSIS — H59033 Cystoid macular edema following cataract surgery, bilateral: Secondary | ICD-10-CM | POA: Diagnosis not present

## 2016-09-04 DIAGNOSIS — I10 Essential (primary) hypertension: Secondary | ICD-10-CM | POA: Diagnosis not present

## 2016-09-04 DIAGNOSIS — H43813 Vitreous degeneration, bilateral: Secondary | ICD-10-CM

## 2016-09-04 DIAGNOSIS — H35033 Hypertensive retinopathy, bilateral: Secondary | ICD-10-CM | POA: Diagnosis not present

## 2016-09-06 ENCOUNTER — Ambulatory Visit (INDEPENDENT_AMBULATORY_CARE_PROVIDER_SITE_OTHER): Payer: PPO | Admitting: Internal Medicine

## 2016-09-06 ENCOUNTER — Encounter: Payer: Self-pay | Admitting: Internal Medicine

## 2016-09-06 VITALS — BP 128/82 | HR 72 | Ht 67.0 in | Wt 177.0 lb

## 2016-09-06 DIAGNOSIS — N62 Hypertrophy of breast: Secondary | ICD-10-CM | POA: Diagnosis not present

## 2016-09-06 DIAGNOSIS — E785 Hyperlipidemia, unspecified: Secondary | ICD-10-CM | POA: Diagnosis not present

## 2016-09-06 DIAGNOSIS — I10 Essential (primary) hypertension: Secondary | ICD-10-CM

## 2016-09-06 DIAGNOSIS — R739 Hyperglycemia, unspecified: Secondary | ICD-10-CM

## 2016-09-06 NOTE — Patient Instructions (Signed)
Please continue all other medications as before, and refills have been done if requested.  Please have the pharmacy call with any other refills you may need.  Please keep your appointments with your specialists as you may have planned  You will be contacted regarding the referral for: mammogram, and Endocrinology  Please go to the LAB in the Basement (turn left off the elevator) for the tests to be done at your convenience  You will be contacted by phone if any changes need to be made immediately.  Otherwise, you will receive a letter about your results with an explanation, but please check with MyChart first.  Please remember to sign up for MyChart if you have not done so, as this will be important to you in the future with finding out test results, communicating by private email, and scheduling acute appointments online when needed.
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
## 2016-09-07 ENCOUNTER — Other Ambulatory Visit: Payer: Self-pay

## 2016-09-07 ENCOUNTER — Other Ambulatory Visit: Payer: Self-pay | Admitting: Internal Medicine

## 2016-09-07 ENCOUNTER — Other Ambulatory Visit (INDEPENDENT_AMBULATORY_CARE_PROVIDER_SITE_OTHER): Payer: PPO

## 2016-09-07 DIAGNOSIS — R739 Hyperglycemia, unspecified: Secondary | ICD-10-CM | POA: Diagnosis not present

## 2016-09-07 DIAGNOSIS — N62 Hypertrophy of breast: Secondary | ICD-10-CM

## 2016-09-07 DIAGNOSIS — E785 Hyperlipidemia, unspecified: Secondary | ICD-10-CM | POA: Diagnosis not present

## 2016-09-07 LAB — BASIC METABOLIC PANEL
BUN: 12 mg/dL (ref 6–23)
CALCIUM: 9.7 mg/dL (ref 8.4–10.5)
CO2: 30 meq/L (ref 19–32)
CREATININE: 1.19 mg/dL (ref 0.40–1.50)
Chloride: 103 mEq/L (ref 96–112)
GFR: 75.36 mL/min (ref >60.00)
GLUCOSE: 104 mg/dL — AB (ref 70–99)
Potassium: 4 mEq/L (ref 3.5–5.1)
Sodium: 140 mEq/L (ref 135–145)

## 2016-09-07 LAB — LIPID PANEL
CHOLESTEROL: 139 mg/dL (ref 0–200)
HDL: 53 mg/dL (ref >39.00)
LDL CALC: 71 mg/dL (ref 0–99)
NonHDL: 86.3
Total CHOL/HDL Ratio: 3
Triglycerides: 78 mg/dL (ref 0.0–149.0)
VLDL: 15.6 mg/dL (ref 0.0–40.0)

## 2016-09-07 LAB — HEMOGLOBIN A1C: HEMOGLOBIN A1C: 6 % (ref 4.6–6.5)

## 2016-09-07 LAB — TESTOSTERONE: Testosterone: 214.81 ng/dL — ABNORMAL LOW (ref 300.00–890.00)

## 2016-09-08 ENCOUNTER — Encounter: Payer: Self-pay | Admitting: Internal Medicine

## 2016-09-08 LAB — ESTRADIOL: Estradiol: 20 pg/mL (ref <=39)

## 2016-09-09 NOTE — Assessment & Plan Note (Signed)
stable overall by history and exam, recent data reviewed with pt, and pt to continue medical treatment as before,  to f/u any worsening symptoms or concerns BP Readings from Last 3 Encounters:  09/06/16 128/82  05/31/16 128/80  05/18/16 140/80

## 2016-09-09 NOTE — Assessment & Plan Note (Signed)
Pt reqeusts lipid f/u on new med, cont lower chol diet,  to f/u any worsening symptoms or concerns

## 2016-09-09 NOTE — Assessment & Plan Note (Signed)
Lab Results  Component Value Date   HGBA1C 6.0 09/07/2016   stable overall by history and exam, recent data reviewed with pt, and pt to continue medical treatment as before,  to f/u any worsening symptoms or concerns

## 2016-09-09 NOTE — Assessment & Plan Note (Signed)
Recent onset, no obvious med changes, for hormone levels, mammogram, refer endo, . to f/u any worsening symptoms or concerns

## 2016-09-13 ENCOUNTER — Ambulatory Visit
Admission: RE | Admit: 2016-09-13 | Discharge: 2016-09-13 | Disposition: A | Discharge status: Home / Self Care | Payer: PPO | Source: Ambulatory Visit | Attending: Internal Medicine | Admitting: Internal Medicine

## 2016-09-13 DIAGNOSIS — R928 Other abnormal and inconclusive findings on diagnostic imaging of breast: Secondary | ICD-10-CM | POA: Diagnosis not present

## 2016-09-13 DIAGNOSIS — N62 Hypertrophy of breast: Secondary | ICD-10-CM

## 2016-09-20 ENCOUNTER — Telehealth: Payer: Self-pay | Admitting: Internal Medicine

## 2016-09-20 NOTE — Telephone Encounter (Signed)
Missed call from office. Patient not sure what the call was regarding.  Thank you,  -LL

## 2016-09-21 NOTE — Telephone Encounter (Signed)
This goes to the cma.

## 2016-09-21 NOTE — Telephone Encounter (Signed)
This patient was calling to schedule a new patient appointment see referral note from 09/08/16

## 2016-09-27 NOTE — Telephone Encounter (Signed)
Called patient to scheduled paat.pb

## 2016-10-09 ENCOUNTER — Ambulatory Visit (INDEPENDENT_AMBULATORY_CARE_PROVIDER_SITE_OTHER): Payer: PPO | Admitting: Cardiovascular Disease

## 2016-10-09 VITALS — BP 122/66 | HR 85 | Ht 67.0 in | Wt 170.0 lb

## 2016-10-09 DIAGNOSIS — I119 Hypertensive heart disease without heart failure: Secondary | ICD-10-CM | POA: Diagnosis not present

## 2016-10-09 DIAGNOSIS — I270 Primary pulmonary hypertension: Secondary | ICD-10-CM

## 2016-10-09 DIAGNOSIS — E7800 Pure hypercholesterolemia, unspecified: Secondary | ICD-10-CM

## 2016-10-09 DIAGNOSIS — N62 Hypertrophy of breast: Secondary | ICD-10-CM

## 2016-10-09 DIAGNOSIS — I491 Atrial premature depolarization: Secondary | ICD-10-CM | POA: Diagnosis not present

## 2016-10-09 DIAGNOSIS — J449 Chronic obstructive pulmonary disease, unspecified: Secondary | ICD-10-CM | POA: Diagnosis not present

## 2016-10-09 MED ORDER — HYDROCHLOROTHIAZIDE 12.5 MG PO CAPS: 12.5000 mg | ORAL_CAPSULE | Freq: Every day | ORAL | 2 refills | Status: DC | PRN

## 2016-10-09 MED ORDER — BISOPROLOL FUMARATE 5 MG PO TABS: 2.5000 mg | ORAL_TABLET | Freq: Every day | ORAL | 3 refills | Status: DC
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
## 2016-10-09 NOTE — Patient Instructions (Signed)
Medication Instructions:   ONLY TAKE HCTZ AS NEEDED   START BISOPROLOL 2.5 MG ONCE DAILY= 1/2 OF 5 MG TABLET ONCE DAILY  Follow-Up:  Your physician wants you to follow-up in: 6 MONTHS WITH DR Carmin RichmondKELLY You will receive a reminder letter in the mail two months in advance. If you don't receive a letter, please call our office to schedule the follow-up appointment.    If you need a refill on your cardiac medications before your next appointment, please call your pharmacy.

## 2016-10-11 ENCOUNTER — Encounter: Payer: Self-pay | Admitting: Cardiovascular Disease

## 2016-10-11 DIAGNOSIS — H18413 Arcus senilis, bilateral: Secondary | ICD-10-CM | POA: Diagnosis not present

## 2016-10-11 DIAGNOSIS — H33101 Unspecified retinoschisis, right eye: Secondary | ICD-10-CM | POA: Diagnosis not present

## 2016-10-11 DIAGNOSIS — Z9849 Cataract extraction status, unspecified eye: Secondary | ICD-10-CM | POA: Diagnosis not present

## 2016-10-11 DIAGNOSIS — H3589 Other specified retinal disorders: Secondary | ICD-10-CM | POA: Diagnosis not present

## 2016-10-11 DIAGNOSIS — L0390 Cellulitis, unspecified: Secondary | ICD-10-CM | POA: Diagnosis not present

## 2016-10-11 DIAGNOSIS — Z961 Presence of intraocular lens: Secondary | ICD-10-CM | POA: Diagnosis not present

## 2016-10-11 DIAGNOSIS — H11423 Conjunctival edema, bilateral: Secondary | ICD-10-CM | POA: Diagnosis not present

## 2016-10-11 DIAGNOSIS — H40023 Open angle with borderline findings, high risk, bilateral: Secondary | ICD-10-CM | POA: Diagnosis not present

## 2016-10-11 DIAGNOSIS — H35372 Puckering of macula, left eye: Secondary | ICD-10-CM | POA: Diagnosis not present

## 2016-10-11 DIAGNOSIS — H00032 Abscess of right lower eyelid: Secondary | ICD-10-CM | POA: Diagnosis not present

## 2016-10-16 ENCOUNTER — Encounter (INDEPENDENT_AMBULATORY_CARE_PROVIDER_SITE_OTHER): Payer: PPO | Admitting: Ophthalmology

## 2016-10-16 DIAGNOSIS — H35033 Hypertensive retinopathy, bilateral: Secondary | ICD-10-CM | POA: Diagnosis not present

## 2016-10-16 DIAGNOSIS — H59033 Cystoid macular edema following cataract surgery, bilateral: Secondary | ICD-10-CM | POA: Diagnosis not present

## 2016-10-16 DIAGNOSIS — I10 Essential (primary) hypertension: Secondary | ICD-10-CM | POA: Diagnosis not present

## 2016-10-16 DIAGNOSIS — H43813 Vitreous degeneration, bilateral: Secondary | ICD-10-CM | POA: Diagnosis not present

## 2016-11-02 ENCOUNTER — Ambulatory Visit (INDEPENDENT_AMBULATORY_CARE_PROVIDER_SITE_OTHER): Payer: PPO | Admitting: Endocrinology

## 2016-11-02 ENCOUNTER — Encounter: Payer: Self-pay | Admitting: Endocrinology

## 2016-11-02 VITALS — BP 122/64 | HR 75 | Ht 67.0 in | Wt 174.0 lb

## 2016-11-02 DIAGNOSIS — Z125 Encounter for screening for malignant neoplasm of prostate: Secondary | ICD-10-CM

## 2016-11-02 DIAGNOSIS — N62 Hypertrophy of breast: Secondary | ICD-10-CM | POA: Diagnosis not present

## 2016-11-02 MED ORDER — TAMOXIFEN CITRATE 10 MG PO TABS: 10.0000 mg | ORAL_TABLET | Freq: Every day | ORAL | 5 refills | Status: DC
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
## 2016-11-02 NOTE — Patient Instructions (Addendum)
I have sent a prescription to your pharmacy, to reduce the breast growth Please come back for a follow-up appointment in 2 months, when you will bbe due for some blood tests.  losing more weight also helps reduce the breast growth.

## 2016-12-05 ENCOUNTER — Telehealth: Payer: Self-pay | Admitting: Neurology

## 2016-12-05 NOTE — Telephone Encounter (Signed)
LMOM asking pt's wife to return my call for further information.

## 2016-12-05 NOTE — Telephone Encounter (Signed)
Patient wife called and states that patient is having really bad headaches and feeling dizzy when he takes his medication she is not sure of the name

## 2016-12-05 NOTE — Telephone Encounter (Signed)
Pt's wife returned my call.  She states that pt is experiencing headaches and dizziness when he takes his Exelon.  Pt has been taking this for 6 months.  Per LOV he was to increase Exelon to 1.5mg  BID, however he still takes just once daily.  Pt now gets angry very easily.  Wife says that he will ask her to do something, then get angry when does - claiming he never asked her to do that.  Example; he asked wife to contact our office about Exelon/headaches, I returned call and LMOM, pt heard message and was angry with wife for calling into the office.  He is also asking the same question about 10 times before he remembers that he asked it prior.  His wife states that this has been going on for about 3-4 months.  I have scheduled pt to come into the clinic on Friday, August 17.  His wife and daughter are agreeable to coming.

## 2016-12-08 ENCOUNTER — Ambulatory Visit: Payer: PPO | Admitting: Neurology

## 2016-12-15 ENCOUNTER — Other Ambulatory Visit: Payer: Self-pay | Admitting: Internal Medicine

## 2016-12-18 DIAGNOSIS — R351 Nocturia: Secondary | ICD-10-CM | POA: Diagnosis not present

## 2016-12-18 DIAGNOSIS — N401 Enlarged prostate with lower urinary tract symptoms: Secondary | ICD-10-CM | POA: Diagnosis not present

## 2017-01-03 ENCOUNTER — Encounter: Payer: Self-pay | Admitting: Endocrinology

## 2017-01-03 ENCOUNTER — Ambulatory Visit (INDEPENDENT_AMBULATORY_CARE_PROVIDER_SITE_OTHER): Payer: PPO | Admitting: Endocrinology

## 2017-01-03 VITALS — BP 124/73 | HR 63 | Wt 173.8 lb

## 2017-01-03 DIAGNOSIS — N62 Hypertrophy of breast: Secondary | ICD-10-CM | POA: Diagnosis not present

## 2017-01-03 MED ORDER — TAMOXIFEN CITRATE 10 MG PO TABS: 10.0000 mg | ORAL_TABLET | Freq: Two times a day (BID) | ORAL | 5 refills | Status: DC

## 2017-01-03 NOTE — Patient Instructions (Addendum)
I have sent a prescription to your pharmacy, to increase the tamoxifen to twice a day.  Please come back for a follow-up appointment in 2 months, when you will bbe due for some blood tests.  losing more weight also helps reduce the breast growth.
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
## 2017-01-14 ENCOUNTER — Other Ambulatory Visit: Payer: Self-pay | Admitting: Internal Medicine

## 2017-01-15 ENCOUNTER — Other Ambulatory Visit: Payer: Self-pay | Admitting: Neurology

## 2017-01-15 DIAGNOSIS — F0390 Unspecified dementia without behavioral disturbance: Secondary | ICD-10-CM

## 2017-01-15 DIAGNOSIS — F03A0 Unspecified dementia, mild, without behavioral disturbance, psychotic disturbance, mood disturbance, and anxiety: Secondary | ICD-10-CM

## 2017-01-17 ENCOUNTER — Other Ambulatory Visit: Payer: Self-pay | Admitting: *Deleted

## 2017-01-17 VITALS — Resp 18 | Ht 63.0 in | Wt 174.0 lb

## 2017-01-17 DIAGNOSIS — I10 Essential (primary) hypertension: Secondary | ICD-10-CM

## 2017-01-17 DIAGNOSIS — K219 Gastro-esophageal reflux disease without esophagitis: Secondary | ICD-10-CM

## 2017-01-17 DIAGNOSIS — N62 Hypertrophy of breast: Secondary | ICD-10-CM

## 2017-01-17 DIAGNOSIS — K5909 Other constipation: Secondary | ICD-10-CM

## 2017-01-17 DIAGNOSIS — J449 Chronic obstructive pulmonary disease, unspecified: Secondary | ICD-10-CM

## 2017-01-18 ENCOUNTER — Encounter: Payer: Self-pay | Admitting: *Deleted
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
## 2017-01-19 ENCOUNTER — Telehealth: Payer: Self-pay | Admitting: Pulmonary Disease

## 2017-01-19 NOTE — Telephone Encounter (Signed)
Left message with patient's Alexander [name] to have him call back for an appt with RA next week.

## 2017-01-19 NOTE — Telephone Encounter (Signed)
FYI Cherina.

## 2017-01-19 NOTE — Telephone Encounter (Signed)
Pt's wife called back and schedules Alexander [name] for Monday 10/1 @ 2:15-tr

## 2017-01-22 ENCOUNTER — Encounter: Payer: Self-pay | Admitting: Pulmonary Disease

## 2017-01-22 ENCOUNTER — Ambulatory Visit (INDEPENDENT_AMBULATORY_CARE_PROVIDER_SITE_OTHER): Payer: PPO | Admitting: Pulmonary Disease

## 2017-01-22 DIAGNOSIS — J449 Chronic obstructive pulmonary disease, unspecified: Secondary | ICD-10-CM | POA: Diagnosis not present

## 2017-01-22 DIAGNOSIS — Z23 Encounter for immunization: Secondary | ICD-10-CM | POA: Diagnosis not present

## 2017-01-22 NOTE — Assessment & Plan Note (Signed)
Flu shot  today. Use Symbicort twice daily. Use albuterol as needed only 

## 2017-01-22 NOTE — Assessment & Plan Note (Signed)
We will hold off on any anticholinergic (such as spiriva) given his history of glaucoma and dementia

## 2017-01-22 NOTE — Progress Notes (Signed)
   Subjective:    Patient ID: Alexander [name], male    DOB: 01/10/1933, 83 y.o.   MRN: [id number]  HPI  54 ex smoker for FU of gold C COPD & dyspnea   I received a note from [facility] and home health stating that he was worse and hence Cardizem and for this visit. He states that he is at baseline. His wife used to be a patient states that he is developing some dementia but his breathing is stable. He does not use Symbicort daily. He does carry his albuterol around with them but does not feel the need to use it often  He denies frequent chest colds and has had no interim ER visits or hospitalizations   Significant tests/ events  08/2010 echo nml 09/2012 Stress test - peak VO2 68%, limited by ventilation c. cath [name]) -11/10 - nml LV fn, nml coronaries  09/2012 Spirometry >>FEV1 1.14 - 45% -moderate airway obstruction   Review of Systems Patient denies significant dyspnea,cough, hemoptysis,  chest pain, palpitations, pedal edema, orthopnea, paroxysmal nocturnal dyspnea, lightheadedness, nausea, vomiting, abdominal or  leg pains      Objective:   Physical Exam   Gen. Pleasant, well-nourished, in no distress ENT - no thrush, no post nasal drip Neck: No JVD, no thyromegaly, no carotid bruits Lungs: no use of accessory muscles, no dullness to percussion, decreased  without rales or rhonchi  Cardiovascular: Rhythm regular, heart sounds  normal, no murmurs or gallops, no peripheral edema Musculoskeletal: No deformities, no cyanosis or clubbing         Assessment & Plan:

## 2017-01-22 NOTE — Patient Instructions (Signed)
Flu shot  today. Use Symbicort twice daily. Use albuterol as needed only

## 2017-01-30 ENCOUNTER — Encounter: Payer: Self-pay | Admitting: Internal Medicine

## 2017-01-30 ENCOUNTER — Other Ambulatory Visit (INDEPENDENT_AMBULATORY_CARE_PROVIDER_SITE_OTHER): Payer: PPO

## 2017-01-30 ENCOUNTER — Ambulatory Visit (INDEPENDENT_AMBULATORY_CARE_PROVIDER_SITE_OTHER): Payer: PPO | Admitting: Internal Medicine

## 2017-01-30 VITALS — BP 104/68 | HR 60 | Temp 98.4°F | Ht 63.0 in | Wt 169.0 lb

## 2017-01-30 DIAGNOSIS — R351 Nocturia: Secondary | ICD-10-CM

## 2017-01-30 DIAGNOSIS — N401 Enlarged prostate with lower urinary tract symptoms: Secondary | ICD-10-CM

## 2017-01-30 DIAGNOSIS — N138 Other obstructive and reflux uropathy: Secondary | ICD-10-CM

## 2017-01-30 DIAGNOSIS — J449 Chronic obstructive pulmonary disease, unspecified: Secondary | ICD-10-CM | POA: Diagnosis not present

## 2017-01-30 DIAGNOSIS — I10 Essential (primary) hypertension: Secondary | ICD-10-CM

## 2017-01-30 MED ORDER — SILODOSIN 8 MG PO CAPS: 8.0000 mg | ORAL_CAPSULE | Freq: Every day | ORAL | 3 refills | Status: DC
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
## 2017-01-30 NOTE — Patient Instructions (Addendum)
OK to take the Rapaflo 8 mg per day  Please continue all other medications as before, and refills have been done if requested.  Please have the pharmacy call with any other refills you may need.  Please continue your efforts at being more active, low cholesterol diet, and weight control.  Please keep your appointments with your specialists as you may have planned  Please go to the LAB in the Basement (turn left off the elevator) for the tests to be done today - just the urine testing today  You will be contacted by phone if any changes need to be made immediately.  Otherwise, you will receive a letter about your results with an explanation, but please check with MyChart first.  Please remember to sign up for MyChart if you have not done so, as this will be important to you in the future with finding out test results, communicating by private email, and scheduling acute appointments online when needed.

## 2017-01-30 NOTE — Assessment & Plan Note (Signed)
stable overall by history and exam, and pt to continue medical treatment as before,  to f/u any worsening symptoms or concerns 

## 2017-01-30 NOTE — Assessment & Plan Note (Addendum)
With worsening symptoms reported by wife and pt; to start rapaflo 8 mg, check UA and cx but likely negative, consider urology f/u

## 2017-01-30 NOTE — Assessment & Plan Note (Signed)
stable overall by history and exam, recent data reviewed with pt, and pt to continue medical treatment as before,  to f/u any worsening symptoms or concerns BP Readings from Last 3 Encounters:  01/30/17 104/68  01/22/17 118/64  01/03/17 124/73

## 2017-01-31 ENCOUNTER — Encounter: Payer: Self-pay | Admitting: Internal Medicine

## 2017-01-31 LAB — URINE CULTURE
MICRO NUMBER:: 81123353
RESULT: NO GROWTH
SPECIMEN QUALITY: ADEQUATE

## 2017-01-31 LAB — URINALYSIS, ROUTINE W REFLEX MICROSCOPIC
Bilirubin Urine: NEGATIVE
Hgb urine dipstick: NEGATIVE
Ketones, ur: NEGATIVE
Leukocytes, UA: NEGATIVE
Nitrite: NEGATIVE
PH: 5.5 (ref 5.0–8.0)
RBC / HPF: NONE SEEN (ref 0–?)
Total Protein, Urine: 30 — AB
Urine Glucose: NEGATIVE
Urobilinogen, UA: 0.2 (ref 0.0–1.0)
WBC UA: NONE SEEN (ref 0–?)

## 2017-02-20 ENCOUNTER — Encounter: Payer: Self-pay | Admitting: Endocrinology

## 2017-02-20 ENCOUNTER — Ambulatory Visit (INDEPENDENT_AMBULATORY_CARE_PROVIDER_SITE_OTHER): Payer: PPO | Admitting: Endocrinology

## 2017-02-20 VITALS — BP 112/68 | HR 57 | Wt 171.0 lb

## 2017-02-20 DIAGNOSIS — Z125 Encounter for screening for malignant neoplasm of prostate: Secondary | ICD-10-CM | POA: Diagnosis not present

## 2017-02-20 DIAGNOSIS — N62 Hypertrophy of breast: Secondary | ICD-10-CM | POA: Diagnosis not present

## 2017-02-20 LAB — HCG, QUANTITATIVE, PREGNANCY: QUANTITATIVE HCG: 0.19 m[IU]/mL

## 2017-02-20 LAB — PSA: PSA: 3.05 ng/mL (ref 0.10–4.00)

## 2017-02-20 MED ORDER — TRIAMCINOLONE ACETONIDE 0.1 % EX CREA: 1.0000 "application " | TOPICAL_CREAM | Freq: Three times a day (TID) | CUTANEOUS | 3 refills | Status: DC
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
## 2017-02-20 NOTE — Patient Instructions (Addendum)
blood tests are requested for you today.  We'll let you know about the results. Try stopping the tamoxifen losing more weight also helps reduce the breast growth.  I have sent a prescription to your pharmacy, for the itching.   Please come back for a follow-up appointment in 6 months.

## 2017-02-21 LAB — TESTOSTERONE,FREE AND TOTAL
TESTOSTERONE FREE: 4.9 pg/mL — AB (ref 6.6–18.1)
TESTOSTERONE: 337 ng/dL (ref 264–916)

## 2017-02-21 LAB — PROLACTIN: Prolactin: 6.7 ng/mL (ref 2.0–18.0)

## 2017-03-05 ENCOUNTER — Ambulatory Visit: Payer: PPO | Admitting: Endocrinology

## 2017-04-05 DIAGNOSIS — H35033 Hypertensive retinopathy, bilateral: Secondary | ICD-10-CM | POA: Diagnosis not present

## 2017-04-05 DIAGNOSIS — H52223 Regular astigmatism, bilateral: Secondary | ICD-10-CM | POA: Diagnosis not present

## 2017-04-05 DIAGNOSIS — H401231 Low-tension glaucoma, bilateral, mild stage: Secondary | ICD-10-CM | POA: Diagnosis not present

## 2017-04-05 DIAGNOSIS — I10 Essential (primary) hypertension: Secondary | ICD-10-CM | POA: Diagnosis not present

## 2017-04-05 DIAGNOSIS — H35372 Puckering of macula, left eye: Secondary | ICD-10-CM | POA: Diagnosis not present

## 2017-04-05 DIAGNOSIS — Z9849 Cataract extraction status, unspecified eye: Secondary | ICD-10-CM | POA: Diagnosis not present

## 2017-04-05 DIAGNOSIS — H5203 Hypermetropia, bilateral: Secondary | ICD-10-CM | POA: Diagnosis not present

## 2017-04-05 DIAGNOSIS — H47233 Glaucomatous optic atrophy, bilateral: Secondary | ICD-10-CM | POA: Diagnosis not present

## 2017-04-05 DIAGNOSIS — Z961 Presence of intraocular lens: Secondary | ICD-10-CM | POA: Diagnosis not present

## 2017-04-06 ENCOUNTER — Telehealth: Payer: Self-pay | Admitting: Internal Medicine

## 2017-04-06 NOTE — Telephone Encounter (Signed)
All prior a1c have been normal, with the last one done may 2018  He liekly does not have uncontrolled DM, but ok for Nurse Visit for A1c check, and no OV with me is probably needed for this

## 2017-04-06 NOTE — Telephone Encounter (Signed)
Please schedule pt for a nurse visit for a A1c check. Thanks!

## 2017-04-06 NOTE — Telephone Encounter (Signed)
Would the patient need an OV to come discuss?

## 2017-04-06 NOTE — Telephone Encounter (Signed)
Copied from CRM [id number]. Topic: Quick Communication - See Telephone Encounter >> Apr 06, 2017  9:32 AM Diana EvesHoyt, Maryann B wrote: CRM for notification. See Telephone encounter for:  Pt went to eye doc yesterday and eye doc wanted to know if he has ever been tested for diabetes. He thinks that it might be effecting his eyes.  04/06/17.

## 2017-04-06 NOTE — Telephone Encounter (Signed)
Called patient and left a message... Schedule as: Provider: Nurse Visit Type: Nurse Visit Notes: A1c Check

## 2017-04-09 ENCOUNTER — Encounter: Payer: Self-pay | Admitting: Internal Medicine

## 2017-04-09 ENCOUNTER — Ambulatory Visit (INDEPENDENT_AMBULATORY_CARE_PROVIDER_SITE_OTHER): Payer: PPO | Admitting: *Deleted

## 2017-04-09 DIAGNOSIS — Z131 Encounter for screening for diabetes mellitus: Secondary | ICD-10-CM

## 2017-04-09 LAB — POCT GLYCOSYLATED HEMOGLOBIN (HGB A1C): HEMOGLOBIN A1C: 5.5

## 2017-04-09 NOTE — Progress Notes (Signed)
Results was normal at 5.5.Marland Kitchen.Marland Kitchen. Pt will f/u w/provider in 6 months

## 2017-04-12 ENCOUNTER — Ambulatory Visit: Payer: PPO | Admitting: Internal Medicine

## 2017-04-12 ENCOUNTER — Encounter: Payer: Self-pay | Admitting: Internal Medicine

## 2017-04-12 VITALS — BP 116/78 | HR 74 | Temp 97.7°F | Ht 63.0 in | Wt 173.0 lb

## 2017-04-12 DIAGNOSIS — I10 Essential (primary) hypertension: Secondary | ICD-10-CM | POA: Diagnosis not present

## 2017-04-12 DIAGNOSIS — J309 Allergic rhinitis, unspecified: Secondary | ICD-10-CM

## 2017-04-12 DIAGNOSIS — J449 Chronic obstructive pulmonary disease, unspecified: Secondary | ICD-10-CM | POA: Diagnosis not present

## 2017-04-12 DIAGNOSIS — E7800 Pure hypercholesterolemia, unspecified: Secondary | ICD-10-CM | POA: Diagnosis not present

## 2017-04-12 DIAGNOSIS — N529 Male erectile dysfunction, unspecified: Secondary | ICD-10-CM

## 2017-04-12 DIAGNOSIS — R739 Hyperglycemia, unspecified: Secondary | ICD-10-CM

## 2017-04-12 MED ORDER — TRIAMCINOLONE ACETONIDE 55 MCG/ACT NA AERO: 2.0000 | INHALATION_SPRAY | Freq: Every day | NASAL | 12 refills | Status: DC

## 2017-04-12 MED ORDER — SILDENAFIL CITRATE 100 MG PO TABS: 50.0000 mg | ORAL_TABLET | Freq: Every day | ORAL | 11 refills | Status: DC | PRN

## 2017-04-12 NOTE — Assessment & Plan Note (Signed)
BP Readings from Last 3 Encounters:  04/12/17 116/78  02/20/17 112/68  01/30/17 104/68  stable overall by history and exam, recent data reviewed with pt, and pt to continue medical treatment as before,  to f/u any worsening symptoms or concerns

## 2017-04-12 NOTE — Assessment & Plan Note (Signed)
Lab Results  Component Value Date   LDLCALC 71 09/07/2016  stable overall by history and exam, recent data reviewed with pt, and pt to continue medical treatment as before,  to f/u any worsening symptoms or concerns

## 2017-04-12 NOTE — Assessment & Plan Note (Signed)
Mild to mod, for nasacort asd,  to f/u any worsening symptoms or concerns 

## 2017-04-12 NOTE — Assessment & Plan Note (Signed)
Ok for viagra prn, done erx
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
## 2017-04-12 NOTE — Assessment & Plan Note (Signed)
Lab Results  Component Value Date   HGBA1C 5.5 04/09/2017  stable overall by history and exam, recent data reviewed with pt, and pt to continue medical treatment as before,  to f/u any worsening symptoms or concerns

## 2017-04-12 NOTE — Assessment & Plan Note (Signed)
stable overall by history and exam, and pt to continue medical treatment as before,  to f/u any worsening symptoms or concerns 

## 2017-04-12 NOTE — Patient Instructions (Addendum)
Please take all new medication as prescribed - the viagra, and the Nasal Spray for allergies  Please continue all other medications as before, and refills have been done if requested.  Please have the pharmacy call with any other refills you may need.  Please keep your appointments with your specialists as you may have planned

## 2017-04-13 ENCOUNTER — Other Ambulatory Visit: Payer: Self-pay

## 2017-04-13 DIAGNOSIS — I270 Primary pulmonary hypertension: Secondary | ICD-10-CM

## 2017-04-13 MED ORDER — BISOPROLOL FUMARATE 5 MG PO TABS: 2.5000 mg | ORAL_TABLET | Freq: Every day | ORAL | 0 refills | Status: DC

## 2017-04-16 ENCOUNTER — Telehealth: Payer: Self-pay | Admitting: *Deleted

## 2017-04-16 NOTE — Telephone Encounter (Signed)
Received request from [facility] for alternative to Bisoprolol as this is on long term backorder.    Routed to MD/pharm to review

## 2017-04-18 NOTE — Telephone Encounter (Signed)
Looks like he was on metoprolol succ 50 mg qd prior to being switched at last visit to bisoprolol.   He can go back on the metoprolol dose or he can try Bystolic, but this might be cost prohibitive.  See if he noticed any changes in COPD after getting off the metoprolol - if not then he can go back to it.

## 2017-04-19 NOTE — Telephone Encounter (Signed)
Agree.  To bisoprolol and the Bystolic more cardioselective and of COPD is an issue these would be preferred. .  He had not noticed any change or benefit when he was on treatment compared to previous metoprolol and can resume metoprolol but may want to consider a lower dose initially.

## 2017-04-23 MED ORDER — METOPROLOL SUCCINATE ER 50 MG PO TB24: 50.0000 mg | ORAL_TABLET | Freq: Every day | ORAL | 3 refills | Status: DC

## 2017-04-23 NOTE — Telephone Encounter (Signed)
Spoke to patient. States he did not notice any change in COPD symptoms when stopping the metoprolol and starting the bisoprolol.  Advised that [facility] has requested alternative as this is on long term backorder.   Patient aware to use what he has of bisoprolol, once he runs out he will stop this and start metoprolol succinate 50 mg daily.    rx sent to pharmacy.  Patient aware and verbalized understanding.

## 2017-05-02 ENCOUNTER — Ambulatory Visit: Payer: PPO | Admitting: Cardiovascular Disease

## 2017-05-02 ENCOUNTER — Encounter: Payer: Self-pay | Admitting: Cardiovascular Disease

## 2017-05-02 VITALS — BP 120/70 | HR 72 | Ht 65.0 in | Wt 171.0 lb

## 2017-05-02 DIAGNOSIS — E7800 Pure hypercholesterolemia, unspecified: Secondary | ICD-10-CM | POA: Diagnosis not present

## 2017-05-02 DIAGNOSIS — N62 Hypertrophy of breast: Secondary | ICD-10-CM

## 2017-05-02 DIAGNOSIS — I119 Hypertensive heart disease without heart failure: Secondary | ICD-10-CM

## 2017-05-02 DIAGNOSIS — J449 Chronic obstructive pulmonary disease, unspecified: Secondary | ICD-10-CM
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
## 2017-05-02 NOTE — Patient Instructions (Signed)
Medication Instructions:  STOP metoprolol  Follow-Up: Your physician wants you to follow-up in: 6 months with Dr. Tresa EndoKelly. You will receive a reminder letter in the mail two months in advance. If you don't receive a letter, please call our office to schedule the follow-up appointment.   Any Other Special Instructions Will Be Listed Below (If Applicable).     If you need a refill on your cardiac medications before your next appointment, please call your pharmacy.

## 2017-05-03 ENCOUNTER — Encounter: Payer: Self-pay | Admitting: Cardiovascular Disease

## 2017-05-04 ENCOUNTER — Encounter: Payer: Self-pay | Admitting: Internal Medicine

## 2017-05-04 ENCOUNTER — Ambulatory Visit (INDEPENDENT_AMBULATORY_CARE_PROVIDER_SITE_OTHER): Payer: PPO | Admitting: Internal Medicine

## 2017-05-04 ENCOUNTER — Ambulatory Visit (INDEPENDENT_AMBULATORY_CARE_PROVIDER_SITE_OTHER)
Admission: RE | Admit: 2017-05-04 | Discharge: 2017-05-04 | Disposition: A | Discharge status: Home / Self Care | Payer: PPO | Source: Ambulatory Visit | Attending: Internal Medicine | Admitting: Internal Medicine

## 2017-05-04 VITALS — BP 138/62 | HR 68 | Ht 65.0 in | Wt 173.0 lb

## 2017-05-04 DIAGNOSIS — J449 Chronic obstructive pulmonary disease, unspecified: Secondary | ICD-10-CM

## 2017-05-04 DIAGNOSIS — R05 Cough: Secondary | ICD-10-CM

## 2017-05-04 DIAGNOSIS — R0602 Shortness of breath: Secondary | ICD-10-CM | POA: Diagnosis not present

## 2017-05-04 DIAGNOSIS — R058 Other specified cough: Secondary | ICD-10-CM

## 2017-05-04 MED ORDER — BUDESONIDE-FORMOTEROL FUMARATE 80-4.5 MCG/ACT IN AERO: 2.0000 | INHALATION_SPRAY | Freq: Two times a day (BID) | RESPIRATORY_TRACT | 0 refills | Status: DC

## 2017-05-04 MED ORDER — PREDNISONE 10 MG PO TABS: ORAL_TABLET | ORAL | 0 refills | Status: DC

## 2017-05-04 MED ORDER — AMOXICILLIN-POT CLAVULANATE 875-125 MG PO TABS: 1.0000 | ORAL_TABLET | Freq: Two times a day (BID) | ORAL | 0 refills | Status: AC

## 2017-05-04 NOTE — Progress Notes (Signed)
Subjective:    Patient ID: Alexander [name], male    DOB: 1933-04-10, 84 y.o.   MRN: [id number]     Brief patient profile:  84    yobm quit smoking 04/2000    gold III COPD & dyspnea   01/09/13  Cardiac evaluation - echo nml  Stress test - peak VO2 68%, limited by ventilation  c. cath [name]) -11/10 - nml LV fn, nml coronaries  Spirometry >>FEV1 1.14 - 45% -moderate airway obstruction     02/03/2014 f/u ov/Wert re:  GOLD III COPD/ very poor hfa  Chief Complaint  Patient presents with  . Acute Visit    Pt c/o wheezing, increased SOB and cough for the past 10-14 days. Cough is prod with moderate clear to grey sputum.      confused with meds, relying on neb sab at baseline and gradually worse x 2 weeks but still p neb can get comfortable at rest, sob across the room. " Prednisone is the only thing that helps me but it wears off over a week:" rec Symbicort 160 Take 2 puffs first thing in am and then another 2 puffs about 12 hours later.  Work on inhaler technique:   Prednisone 10 mg take  4 each am x 2 days,   2 each am x 2 days,  1 each am x 2 days and stop  Only use your albuterol (ventolin)  as a rescue medication  If not better, then use  Nebulizer up to every   4h  as needed    03/17/2016  f/u ov/Wert re:  COPD III / worse cough on ppi qam / h2 hs  Chief Complaint  Patient presents with  . Acute Visit    Pt c/o cough x 1 month with green sputum.  He also c/o having some increased SOB and wheezing.   cough x one month, indolent onset persistently daily but  is worse noct and tends to keep him up with green mucus each am x one month assoc with purulent am sputum and mild doe over baseline, still very confused with meds rec Augmentin 875 mg take one pill twice daily  X 10 days - take at breakfast and supper with large glass of water.  It would help reduce the usual side effects (diarrhea and yeast infections) if you ate cultured yogurt at lunch.  Prednisone 10 mg take  4 each  am x 2 days,   2 each am x 2 days,  1 each am x 2 days and stop  Take the famotidine one hour before bedtime  GERD  Plan A = Automatic = symbicort 160 Take 2 puffs first thing in am and then another 2 puffs about 12 hours later.  Work on inhaler technique:  relax and gently blow all the way out then take a nice smooth deep breath back in, triggering the inhaler at same time you start breathing in.  Hold for up to 5 seconds if you can. Blow out thru nose. Rinse and gargle with water when done Plan B = Backup Only use your albuterol as a rescue medication Plan C = Crisis - only use your albuterol nebulizer if you first try Plan B and it fails to help > ok to use the nebulizer up to every 4 hours but if start needing it regularly call for immediate appointment   05/04/2017 acute extended ov/Wert re:  COPD III  Really not clear what meds he's been taking nor is  his wife sure Chief Complaint  Patient presents with  . 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 No unusual exposure hx or h/o childhood pna/ asthma or knowledge of premature birth.  Sleeping ok flat most nights s cough and without nocturnal    exacerbation  of respiratory  c/o's or need for noct saba. Also denies any obvious fluctuation of symptoms with weather or environmental changes or other aggravating or alleviating factors except as outlined above   Current Allergies, Complete Past Medical History, Past Surgical History, Family  History, and Social History were reviewed in [employer] record.  ROS  The following are not active complaints unless bolded Hoarseness, sore throat, dysphagia, dental problems, itching, sneezing,  nasal congestion or discharge of excess mucus or purulent secretions, ear ache,   fever, chills, sweats, unintended wt loss or wt gain, classically pleuritic or exertional cp,  orthopnea pnd or leg swelling, presyncope, palpitations, abdominal pain, anorexia, nausea, vomiting, diarrhea  or change in bowel habits or change in bladder habits, change in stools or change in urine, dysuria, hematuria,  rash, arthralgias, visual complaints, headache, numbness, weakness or ataxia or problems with walking or coordination,  change in mood/affect or memory.        Current Meds - unable to verify   Medication Sig  . budesonide-formoterol (SYMBICORT) 160-4.5 MCG/ACT inhaler INHALE 2 PUFFS BY MOUTH INTO THE LUNGS 2 (TWO) TIMES DAILY.  . diphenhydramine-acetaminophen (TYLENOL PM) 25-500 MG TABS tablet Take 1 tablet by mouth at bedtime as needed.  . famotidine (PEPCID) 40 MG tablet Take 40 mg by mouth at bedtime.  . pantoprazole (PROTONIX) 40 MG tablet Take 1 tablet (40 mg total) by mouth daily as needed (for heartburn).  . sildenafil (VIAGRA) 100 MG tablet Take 0.5-1 tablets (50-100 mg total) by mouth daily as needed for erectile dysfunction.  . silodosin (RAPAFLO) 8 MG CAPS capsule Take 1 capsule (8 mg total) by mouth daily with breakfast.  . triamcinolone (NASACORT) 55 MCG/ACT AERO nasal inhaler Place 2 sprays into the nose daily.  Marland Kitchen triamcinolone cream (KENALOG) 0.1 % Apply 1 application topically 3 (three) times daily. As needed for itching.  . [DISCONTINUED] hydrochlorothiazide (MICROZIDE) 12.5 MG capsule Take 1 capsule (12.5 mg total) by mouth daily as needed.  . [DISCONTINUED] LINZESS 145 MCG CAPS capsule Take 1 tablet by mouth daily before breakfast. Reported on 05/15/2015  .  [DISCONTINUED] rosuvastatin (CRESTOR) 10 MG tablet TAKE 1 TABLET (10 MG TOTAL) BY MOUTH DAILY.                Objective:   Physical Exam  amb stoic bm nad  Vital signs reviewed - Note on arrival 02 sats  95% on RA    05/04/2017       173   03/17/2016    182   02/03/14 177 lb (80.287 kg)  01/27/14 177 lb 12 oz (80.627 kg)  01/06/14 173 lb 8 oz (78.699 kg)     HEENT: nl   oropharynx. Nl external ear canals without cough reflex - full dentures/ moderate bilateral non-specific turbinate edema     NECK :  without JVD/Nodes/TM/ nl carotid upstrokes bilaterally   LUNGS: no acc muscle use,  slt barrel contour / hyper resonant to percussion/ minimal bilateral exp rhonchi which are more upper than lower airway    CV:  RRR  no s3 or murmur or increase in P2, and no edema   ABD:  soft and nontender with nl inspiratory excursion in the supine position. No bruits or organomegaly appreciated, bowel sounds nl  MS:  Nl gait/ ext warm without deformities, calf tenderness, cyanosis or clubbing No obvious joint restrictions   SKIN: warm and dry without lesions    NEURO:  alert, approp, nl sensorium with  no motor or cerebellar deficits apparent.       CXR PA and Lateral:   05/04/2017 :    I personally reviewed images and   impression as follows:   Copd / scarring only              Assessment & Plan:

## 2017-05-04 NOTE — Patient Instructions (Addendum)
Symbicort 80  Take 2 puffs first thing in am and then another 2 puffs about 12 hours later.   Pantoprazole (protonix) 40 mg   Take  30-60 min before first meal of the day and Pepcid (famotidine)  20 mg one @  bedtime until return to office - this is the best way to tell whether stomach acid is contributing to your problem.   Prednisone 10 mg take  4 each am x 2 days,   2 each am x 2 days,  1 each am x 2 days and stop    Augmentin 875 mg take one pill twice daily  X 10 days - take at breakfast and supper with large glass of water.  It would help reduce the usual side effects (diarrhea and yeast infections) if you ate cultured yogurt at lunch.    GERD (REFLUX)  is an extremely common cause of respiratory symptoms just like yours , many times with no obvious heartburn at all.    It can be treated with medication, but also with lifestyle changes including elevation of the head of your bed (ideally with 6 inch  bed blocks),  Smoking cessation, avoidance of late meals, excessive alcohol, and avoid fatty foods, chocolate, peppermint, colas, red wine, and acidic juices such as orange juice.  NO MINT OR MENTHOL PRODUCTS SO NO COUGH DROPS   USE SUGARLESS CANDY INSTEAD (Jolley ranchers or Stover's or Life Savers) or even ice chips will also do - the key is to swallow to prevent all throat clearing. NO OIL BASED VITAMINS - use powdered substitutes.    Work on inhaler technique:  relax and gently blow all the way out then take a nice smooth deep breath back in, triggering the inhaler at same time you start breathing in.  Hold for up to 5 seconds if you can. Blow out thru nose. Rinse and gargle with water when done      Please remember to go to the  x-ray department downstairs in the basement  for your tests - we will call you with the results when they are available.   Please schedule a follow up office visit in 2 weeks, sooner if needed with all medications / inhalers / solution

## 2017-05-05 ENCOUNTER — Encounter: Payer: Self-pay | Admitting: Internal Medicine

## 2017-05-05 NOTE — Assessment & Plan Note (Signed)
Will try max rx for gerd/ sinus dz/lower dose of hfa ics  and return in 2 weeks to regroup  Advised pt and wife: The standardized cough guidelines published in Chest by Stark Fallsichard Irwin in 2006 are still the best available and consist of a multiple step process (up to 12!) , not a single office visit,  and are intended  to address this problem logically,  with an alogrithm dependent on response to empiric treatment at  each progressive step  to determine a specific diagnosis with  minimal addtional testing needed. Therefore if adherence is an issue or can't be accurately verified,  it's very unlikely the standard evaluation and treatment will be successful here.    Furthermore, response to therapy (other than acute cough suppression, which should only be used short term with avoidance of narcotic containing cough syrups if possible), can be a gradual process for which the patient is not likely to  perceive immediate benefit.  Unlike going to an eye doctor where the best perscription is almost always the first one and is immediately effective, this is almost never the case in the management of chronic cough syndromes. Therefore the patient needs to commit up front to consistently adhere to recommendations  for up to 6 weeks of therapy directed at the likely underlying problem(s) before the response can be reasonably evaluated.

## 2017-05-05 NOTE — Assessment & Plan Note (Addendum)
Spirometry 09/27/12  FEV1  1.06 (44%) ratio 47  - Spirometry 05/04/2017  FEV1 0.32 (16%)  Ratio 81 but f/v not physiologic   - 05/04/2017  After extensive coaching inhaler device  effectiveness =    75% from a baseline of 50%  > try symb 80 2bid as cough is the primary concern    DDX of  difficult airways management almost all start with A and  include Adherence, Ace Inhibitors, Acid Reflux, Active Sinus Disease, Alpha 1 Antitripsin deficiency, Anxiety masquerading as Airways dz,  ABPA,  Allergy(esp in young), Aspiration (esp in elderly), Adverse effects of meds,  Active smokers, A bunch of PE's (a small clot burden can't cause this syndrome unless there is already severe underlying pulm or vascular dz with poor reserve) plus two Bs  = Bronchiectasis and Beta blocker use..and one C= CHF    Adherence is always the initial "prime suspect" and is a multilayered concern that requires a "trust but verify" approach in every patient - starting with knowing how to use medications, especially inhalers, correctly, keeping up with refills and understanding the fundamental difference between maintenance and prns vs those medications only taken for a very short course and then stopped and not refilled.  - see hfa teaching - return with all meds in hand using a trust but verify approach to confirm accurate Medication  Reconciliation The principal here is that until we are certain that the  patients are doing what we've asked, it makes no sense to ask them to do more.    ? Acid (or non-acid) GERD > always difficult to exclude as up to 75% of pts in some series report no assoc GI/ Heartburn symptoms> rec max (24h)  acid suppression and diet restrictions/ reviewed and instructions given in writing.    ? Allergy >  Doubt/ cover with Prednisone 10 mg take  4 each am x 2 days,   2 each am x 2 days,  1 each am x 2 days and stop and see if doesn't do better with cough on the lower strength of symbicort  ? Active sinus dz >  continue nasacort/ add  augmentin x 10 days then sinus CT next   ? Anxiety /depression / ? Cognitive decline > usually at the bottom of this list of usual suspects but maybe higher here.   ? Bronchiectasis > consider HRCT   ? chf > nithing to suggest it here.    I had an extended discussion with the patient reviewing all relevant studies completed to date and  lasting 25 minutes of a 40  minute extended office  visit addressing  severe non-specific but potentially very serious refractory respiratory symptoms of uncertain and potentially multiple  etiologies.   Each maintenance medication was reviewed in detail including most importantly the difference between maintenance and prns and under what circumstances the prns are to be triggered using an action plan format that is not reflected in the computer generated alphabetically organized AVS.    Please see AVS for specific instructions unique to this office visit that I personally wrote and verbalized to the the pt in detail and then reviewed with pt  by my nurse highlighting any changes in therapy/plan of care  recommended at today's visit.

## 2017-05-07 NOTE — Progress Notes (Signed)
Spoke with pt and notified of results per Dr. Wert. Pt verbalized understanding and denied any questions. 

## 2017-05-21 ENCOUNTER — Ambulatory Visit (INDEPENDENT_AMBULATORY_CARE_PROVIDER_SITE_OTHER): Payer: PPO | Admitting: Internal Medicine

## 2017-05-21 ENCOUNTER — Other Ambulatory Visit (INDEPENDENT_AMBULATORY_CARE_PROVIDER_SITE_OTHER): Payer: PPO

## 2017-05-21 ENCOUNTER — Encounter: Payer: Self-pay | Admitting: Internal Medicine

## 2017-05-21 VITALS — BP 112/60 | HR 90 | Ht 65.0 in | Wt 175.0 lb

## 2017-05-21 DIAGNOSIS — R05 Cough: Secondary | ICD-10-CM

## 2017-05-21 DIAGNOSIS — R058 Other specified cough: Secondary | ICD-10-CM

## 2017-05-21 DIAGNOSIS — J449 Chronic obstructive pulmonary disease, unspecified: Secondary | ICD-10-CM

## 2017-05-21 DIAGNOSIS — R059 Cough, unspecified: Secondary | ICD-10-CM

## 2017-05-21 LAB — CBC WITH DIFFERENTIAL/PLATELET
BASOS ABS: 0 10*3/uL (ref 0.0–0.1)
BASOS PCT: 0.8 % (ref 0.0–3.0)
EOS PCT: 2.6 % (ref 0.0–5.0)
Eosinophils Absolute: 0.2 10*3/uL (ref 0.0–0.7)
HEMATOCRIT: 39.7 % (ref 39.0–52.0)
Hemoglobin: 13 g/dL (ref 13.0–17.0)
LYMPHS PCT: 17.2 % (ref 12.0–46.0)
Lymphs Abs: 1.1 10*3/uL (ref 0.7–4.0)
MCHC: 32.9 g/dL (ref 30.0–36.0)
MCV: 94.6 fl (ref 78.0–100.0)
MONOS PCT: 16.3 % — AB (ref 3.0–12.0)
Monocytes Absolute: 1 10*3/uL (ref 0.1–1.0)
Neutro Abs: 4 10*3/uL (ref 1.4–7.7)
Neutrophils Relative %: 63.1 % (ref 43.0–77.0)
Platelets: 255 10*3/uL (ref 150.0–400.0)
RBC: 4.19 Mil/uL — ABNORMAL LOW (ref 4.22–5.81)
RDW: 13 % (ref 11.5–15.5)
WBC: 6.3 10*3/uL (ref 4.0–10.5)

## 2017-05-21 MED ORDER — BUDESONIDE-FORMOTEROL FUMARATE 80-4.5 MCG/ACT IN AERO: 2.0000 | INHALATION_SPRAY | Freq: Two times a day (BID) | RESPIRATORY_TRACT | 0 refills | Status: DC

## 2017-05-21 MED ORDER — ACETAMINOPHEN-CODEINE #3 300-30 MG PO TABS: ORAL_TABLET | ORAL | 0 refills | Status: DC

## 2017-05-21 NOTE — Progress Notes (Signed)
Subjective:    Patient ID: Alexander [name], male    DOB: 1932-05-10, 84 y.o.   MRN: [id number]     Brief patient profile:  84  yobm quit smoking 04/2000    gold III COPD & dyspnea   01/09/13  Cardiac evaluation - echo nml  Stress test - peak VO2 68%, limited by ventilation  c. cath [name] -11/10 - nml LV fn, nml coronaries  Spirometry >>FEV1 1.14 - 45% -moderate airway obstruction     02/03/2014 f/u ov/Alexander [name] re:  GOLD III COPD/ very poor hfa  Chief Complaint  Patient presents with  . Acute Visit    Pt c/o wheezing, increased SOB and cough for the past 10-14 days. Cough is prod with moderate clear to grey sputum.      confused with meds, relying on neb sab at baseline and gradually worse x 2 weeks but still p neb can get comfortable at rest, sob across the room. " Prednisone is the only thing that helps me but it wears off over a week:" rec Symbicort 160 Take 2 puffs first thing in am and then another 2 puffs about 12 hours later.  Work on inhaler technique:   Prednisone 10 mg take  4 each am x 2 days,   2 each am x 2 days,  1 each am x 2 days and stop  Only use your albuterol (ventolin)  as a rescue medication  If not better, then use  Nebulizer up to every   4h  as needed    03/17/2016  f/u ov/Alexander [name] re:  COPD III / worse cough on ppi qam / h2 hs  Chief Complaint  Patient presents with  . Acute Visit    Pt c/o cough x 1 month with green sputum.  He also c/o having some increased SOB and wheezing.   cough x one month, indolent onset persistently daily but  is worse noct and tends to keep him up with green mucus each am x one month assoc with purulent am sputum and mild doe over baseline, still very confused with meds rec Augmentin 875 mg take one pill twice daily  X 10 days - take at breakfast and supper with large glass of water.  It would help reduce the usual side effects (diarrhea and yeast infections) if you ate cultured yogurt at lunch.  Prednisone 10 mg take  4 each am  x 2 days,   2 each am x 2 days,  1 each am x 2 days and stop  Take the famotidine one hour before bedtime  GERD  Plan A = Automatic = symbicort 160 Take 2 puffs first thing in am and then another 2 puffs about 12 hours later.  Work on inhaler technique:  relax and gently blow all the way out then take a nice smooth deep breath back in, triggering the inhaler at same time you start breathing in.  Hold for up to 5 seconds if you can. Blow out thru nose. Rinse and gargle with water when done Plan B = Backup Only use your albuterol as a rescue medication Plan C = Crisis - only use your albuterol nebulizer if you first try Plan B and it fails to help > ok to use the nebulizer up to every 4 hours but if start needing it regularly call for immediate appointment     05/04/2017 acute extended ov/Alexander [name] re:  COPD III  Really not clear what meds he's been taking nor is  his wife sure Chief Complaint  Patient presents with  . 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  Pantoprazole (protonix) 40 mg   Take  30-60 min before first meal of the day and Pepcid (famotidine)  20 mg one @  bedtime until return to office Prednisone 10 mg take  4 each am x 2 days,   2 each am x 2 days,  1 each am x 2 days and stop   Augmentin 875 mg take one pill twice daily  X 10 days - take at breakfast and supper with large glass of water.  GERD diet  Work on inhaler technique   Please schedule a follow up office visit in 2 weeks, sooner if needed with all medications / inhalers /  solutions    05/21/2017  f/u ov/Alexander [name] re:  COPD GOLD III / main problem is coughing > sob  Chief Complaint  Patient presents with  . Follow-up    Cough is about the same. He has not been looking at what color sputum he is producing. He still has abx left over, he has only been taking 1 tablet daily.   Cough was gone to his satisfaction until 3 days prior to OV  Then acute worse/ min productive/ still on augmentin  Wife has same "cold"   pt brought symbicort 80 sample well past the red line/ again confused with meds Dspnea:  No change = MMRC3 = can't walk 100 yards even at a slow pace at a flat grade s stopping due to sob  / not using neb saba at all or following action plan  Sleep: disrupted by coughing fits now and has to sleep propped up   No obvious day to day or daytime variability or assoc excess/ purulent sputum or mucus plugs or hemoptysis or cp or chest tightness, subjective wheeze or overt sinus or hb symptoms. No unusual exposure hx or h/o childhood pna/ asthma or knowledge of premature birth.  Also denies any obvious fluctuation of symptoms with weather or environmental changes or other aggravating or alleviating factors except as outlined above   Current Allergies, Complete Past Medical History, Past Surgical History, Family History, and Social History were reviewed in [employer] record.  ROS  The following are not active complaints unless bolded Hoarseness, sore throat, dysphagia, dental problems, itching, sneezing,  nasal congestion or discharge of excess mucus or purulent secretions, ear ache,   fever, chills, sweats, unintended wt loss or wt gain, classically pleuritic or exertional cp,  orthopnea pnd or leg swelling, presyncope, palpitations, abdominal pain, anorexia, nausea, vomiting, diarrhea  or change in bowel habits or change in bladder habits, change in stools or change in urine, dysuria, hematuria,  rash, arthralgias, visual complaints, headache,  numbness, weakness or ataxia or problems with walking or coordination,  change in mood/affect or memory.        Current Meds  Medication Sig  . albuterol (PROVENTIL) (2.5 MG/3ML)  0.083% nebulizer solution Take 2.5 mg by nebulization every 6 (six) hours as needed for wheezing or shortness of breath.  Marland Kitchen amoxicillin-clavulanate (AUGMENTIN) 875-125 MG tablet Take 1 tablet by mouth 2 (two) times daily.  . brimonidine (ALPHAGAN) 0.15 % ophthalmic solution Place 1 drop into both eyes 3 (three) times daily.  . budesonide-formoterol (SYMBICORT) 80-4.5 MCG/ACT inhaler Inhale 2 puffs into the lungs 2 (two) times daily.  . hydrochlorothiazide (MICROZIDE) 12.5 MG capsule Take 12.5 mg by mouth daily.  Marland Kitchen linaclotide (LINZESS) 290 MCG CAPS capsule Take 290 mcg by mouth daily before breakfast.  . pantoprazole (PROTONIX) 40 MG tablet Take 1 tablet (40 mg total) by mouth daily as needed (for heartburn).  . rivastigmine (EXELON) 1.5 MG capsule Take 1.5 mg by mouth 2 (two) times daily.  . rosuvastatin (CRESTOR) 10 MG tablet Take 10 mg by mouth daily.  .                 Objective:   Physical Exam  amb bm nad  Vital signs reviewed - Note on arrival 02 sats  100% on RA       05/21/2017       175  05/04/2017       173   03/17/2016    182   02/03/14 177 lb (80.287 kg)  01/27/14 177 lb 12 oz (80.627 kg)  01/06/14 173 lb 8 oz (78.699 kg)       - full dentures/ moderate bilateral non-specific turbinate edema    slt barrel contour / hyper resonant to percussion/ minimal bilateral exp rhonchi which are more upper than lower airway    HEENT: nl   Oropharynx s excess pnd. Nl external ear canals without cough reflex - mild bilateral non-specific turbinate edema /full dentures    NECK :  without JVD/Nodes/TM/ nl carotid upstrokes bilaterally   LUNGS: no acc muscle use,   Barrel contour chest, min  exp rhonchi/ lots of upper airway "wheeze" better with plm   CV:  RRR  no s3 or murmur or increase in P2,  and no edema   ABD:  soft and nontender with nl inspiratory excursion in the supine position. No bruits or organomegaly appreciated, bowel sounds nl  MS:  Nl gait/ ext warm without deformities, calf tenderness, cyanosis or clubbing No obvious joint restrictions   SKIN: warm and dry without lesions    NEURO:  alert, approp, nl sensorium with  no motor or cerebellar deficits apparent.     Labs ordered 05/21/2017  Allergy profile                Assessment & Plan:

## 2017-05-21 NOTE — Patient Instructions (Addendum)
Plan A = Automatic = symbicort 80 Take 2 puffs first thing in am and then another 2 puffs about 12 hours later.   Work on inhaler technique:  relax and gently blow all the way out then take a nice smooth deep breath back in, triggering the inhaler at same time you start breathing in.  Hold for up to 5 seconds if you can. Blow out thru nose. Rinse and gargle with water when done      Plan B = Backup Only use your albuterol as a rescue medication to be used if you can't catch your breath by resting or doing a relaxed purse lip breathing pattern.  - The less you use it, the better it will work when you need it. - Ok to use the  every 4 hours if you must but call for appointment if use goes up over your usual need      Best cough medicine > delsym 2 tsp every 12 hours and if still coughing supplement with Tylenol #3  Finish your antibiotics  Please see patient coordinator before you leave today  to schedule sinus CT   Please remember to go to the lab department downstairs in the basement  for your tests - we will call you with the results when they are available.      Please schedule a follow up office visit in 2 weeks, sooner if needed  with all medications /inhalers/ solutions in hand so we can verify exactly what you are taking. This includes all medications from all doctors and over the counters

## 2017-05-22 ENCOUNTER — Encounter: Payer: Self-pay | Admitting: Internal Medicine

## 2017-05-22 LAB — RESPIRATORY ALLERGY PROFILE REGION II ~~LOC~~
Allergen, A. alternata, m6: <0.1 kU/L
Allergen, Comm Silver Birch, t9: <0.1 kU/L
Allergen, Cottonwood, t14: <0.1 kU/L
Allergen, Mulberry, t76: <0.1 kU/L
Bermuda Grass: <0.1 kU/L
CLADOSPORIUM HERBARUM (M2) IGE: <0.1 kU/L
CLASS: 0
CLASS: 0
CLASS: 0
CLASS: 0
CLASS: 0
CLASS: 0
CLASS: 0
CLASS: 0
CLASS: 0
CLASS: 0
CLASS: 0
CLASS: 0
CLASS: 0
CLASS: 0
COMMON RAGWEED (SHORT) (W1) IGE: <0.1 kU/L
Class: 0
Class: 0
Class: 0
Class: 0
Class: 0
Class: 0
Class: 0
Class: 0
Class: 0
Class: 0
Dog Dander: <0.1 kU/L
IGE (IMMUNOGLOBULIN E), SERUM: 15 kU/L (ref <=114)
Pecan/Hickory Tree IgE: <0.1 kU/L
Rough Pigweed  IgE: <0.1 kU/L
Sheep Sorrel IgE: <0.1 kU/L

## 2017-05-22 LAB — INTERPRETATION:

## 2017-05-22 NOTE — Progress Notes (Signed)
Spoke with pt and notified of results per Dr. Wert. Pt verbalized understanding and denied any questions. 

## 2017-05-22 NOTE — Assessment & Plan Note (Signed)
Allergy profile 05/21/2017 >  Eos 0.2 /  IgE  Pending  -  Sinus CT 05/21/2017 ordered   Upper airway cough syndrome (previously labeled PNDS),  is so named because it's frequently impossible to sort out how much is  CR/sinusitis with freq throat clearing (which can be related to primary GERD)   vs  causing  secondary (" extra esophageal")  GERD from wide swings in gastric pressure that occur with throat clearing, often  promoting self use of mint and menthol lozenges that reduce the lower esophageal sphincter tone and exacerbate the problem further in a cyclical fashion.   These are the same pts (now being labeled as having "irritable larynx syndrome" by some cough centers) who not infrequently have a history of having failed to tolerate ace inhibitors,  dry powder inhalers or biphosphonates or report having atypical/extraesophageal reflux symptoms that don't respond to standard doses of PPI  and are easily confused as having aecopd or asthma flares by even experienced allergists/ pulmonologists (myself included).    Of the three most common causes of  Sub-acute or recurrent or chronic cough, only one (GERD)  can actually contribute to/ trigger  the other two (asthma and post nasal drip syndrome)  and perpetuate the cylce of cough.  While not intuitively obvious, many patients with chronic low grade reflux do not cough until there is a primary insult that disturbs the protective epithelial barrier and exposes sensitive nerve endings.   This is typically viral but can be direct physical injury such as with an endotracheal tube.   The point is that once this occurs, it is difficult to eliminate the cycle  using anything but a maximally effective acid suppression regimen at least in the short run, accompanied by an appropriate diet to address non acid GERD and control / eliminate the cough itself for at least 3 days with tyl #3 prn  I had an extended discussion with the patient reviewing all relevant studies  completed to date and  lasting 15 to 20 minutes of a 25 minute visit    Each maintenance medication was reviewed in detail including most importantly the difference between maintenance and prns and under what circumstances the prns are to be triggered using an action plan format that is not reflected in the computer generated alphabetically organized AVS.    Please see AVS for specific instructions unique to this visit that I personally wrote and verbalized to the the pt in detail and then reviewed with pt  by my nurse highlighting any  changes in therapy recommended at today's visit to their plan of care.

## 2017-05-22 NOTE — Assessment & Plan Note (Signed)
Spirometry 09/27/12  FEV1  1.06 (44%) ratio 47  - Spirometry 05/04/2017  FEV1 0.32 (16%)  Ratio 81 but f/v not physiologic   - 05/04/2017    > try symb 80 2bid as cough is the primary concern  - 05/21/2017  After extensive coaching inhaler device  effectiveness =    75% from a baseline of 25%   Despite typical trigger and poor baseline/ adherence (empty inhaler) and in setting of  ? Uri> really no worse wheeze vs baseline nor need for saba so rec : no change on rx/ work harder on med reconciliation and return with all meds in hand using a trust but verify approach to confirm accurate Medication  Reconciliation The principal here is that until we are certain that the  patients are doing what we've asked, it makes no sense to ask them to do more.

## 2017-05-29 ENCOUNTER — Ambulatory Visit (INDEPENDENT_AMBULATORY_CARE_PROVIDER_SITE_OTHER)
Admission: RE | Admit: 2017-05-29 | Discharge: 2017-05-29 | Disposition: A | Discharge status: Home / Self Care | Payer: PPO | Source: Ambulatory Visit | Attending: Internal Medicine | Admitting: Internal Medicine

## 2017-05-29 ENCOUNTER — Encounter (INDEPENDENT_AMBULATORY_CARE_PROVIDER_SITE_OTHER): Payer: Self-pay

## 2017-05-29 DIAGNOSIS — R05 Cough: Secondary | ICD-10-CM

## 2017-05-29 DIAGNOSIS — J0100 Acute maxillary sinusitis, unspecified: Secondary | ICD-10-CM | POA: Diagnosis not present

## 2017-05-29 DIAGNOSIS — R059 Cough, unspecified: Secondary | ICD-10-CM

## 2017-05-29 DIAGNOSIS — R058 Other specified cough: Secondary | ICD-10-CM

## 2017-05-30 ENCOUNTER — Other Ambulatory Visit: Payer: Self-pay | Admitting: Internal Medicine

## 2017-05-30 DIAGNOSIS — J309 Allergic rhinitis, unspecified: Secondary | ICD-10-CM

## 2017-05-30 NOTE — Progress Notes (Signed)
Spoke with the pt's spouse and notified of results and recs and she verbalized understanding and will inform the pt [facility]

## 2017-05-31 ENCOUNTER — Ambulatory Visit: Payer: PPO | Admitting: Neurology

## 2017-05-31 ENCOUNTER — Encounter: Payer: Self-pay | Admitting: Neurology

## 2017-05-31 VITALS — BP 122/70 | HR 108 | Resp 18 | Ht 65.0 in | Wt 173.0 lb

## 2017-05-31 DIAGNOSIS — F03B0 Unspecified dementia, moderate, without behavioral disturbance, psychotic disturbance, mood disturbance, and anxiety: Secondary | ICD-10-CM

## 2017-05-31 DIAGNOSIS — F0390 Unspecified dementia without behavioral disturbance: Secondary | ICD-10-CM

## 2017-05-31 MED ORDER — RIVASTIGMINE TARTRATE 1.5 MG PO CAPS: 1.5000 mg | ORAL_CAPSULE | Freq: Two times a day (BID) | ORAL | 3 refills | Status: DC

## 2017-05-31 NOTE — Progress Notes (Signed)
NEUROLOGY FOLLOW UP OFFICE NOTE  TAKAO LIZER [id number]  DOB: 05-14-1932  HISTORY OF PRESENT ILLNESS: I had the pleasure of seeing Catarino Vold in follow-up in the neurology clinic on 05/31/2016.  The patient was last seen a year ago for mild to moderate dementia. MMSE in February 2018 was 16/28 (patient illiterate; 18/28 in August 2017, 19/28 in March 2016). He is again accompanied by his wife who helps supplement the history today. On his initial visit, his daughter had called to report he got lost driving and was forgetting a lot of things. He had side effects on Aricept, and was started on Rivastigmine. He had dizziness on BID dosing more than a year ago and reduced dose to 1.5mg  qhs. He was advised on last visit to increase to BID, but continues to only take it at bedtime without side effects. His wife is always with him when driving, and states he does fine, she has no driving concerns. He does repeat himself, but she states he is excellent with regular ADLs, able to dress, bathe, and choose his clothes without need for help. When asked about personality changes, she states he has only been irritable twice. He is only taking 2 medications, but stated he is on a lot of pills. His wife reminded him he is only on the reflux medication and Exelon. He has occasional headaches. He denies any diplopia, dysarthria, dysphagia, neck/back pain, focal numbness/tingling/weakness, anosmia, or tremors. No falls.   HPI: This is a pleasant 84 yo RH man with a history of hypertension, hyperlipidemia,with worsening memory loss. When asked about his memory, he states "there ain't none." He noticed memory changes in his late 28s, he would forget names, however recently he would be talking about something and forget what he was talking about. He misplaces things frequently. He got lost driving one time. He was picking his wife up one time and did not know where he was (15 years ago). He occasionally forgets to  take his medications and has to be told repeatedly to take it. He has noticed word-finding difficulties. No problems performing ADLs independently. He lives with his wife.  His daughter has noticed changes more in the past year or so. She would tell him something then the next minute he forgets. He asks the same questions repeatedly. At the end of the visit, his daughter walked out to speak with me personally, and stated that her father underreported symptoms, and that she and her mother have been concerned about his driving. She did not want to mention this in front of him because he would get upset. She denies any paranoia. He states he sees things moving around especially at night, like shadows, sometimes he thinks something is crawling around on the fence. He has constipation.  His father had memory loss.   I personally reviewed MRI brain done in 01/2014 which showed prominent mineral deposition in the globus pallidus, midbrain, and dentate nucleus. This may be normal but has been described in Parkinson's like syndromes. Moderate chronic microvascular change.  PAST MEDICAL HISTORY: Past Medical History:  Diagnosis Date  . Asthma   . Constipation   . COPD (chronic obstructive pulmonary disease) (HCC)    chronic dyspnea  . GERD (gastroesophageal reflux disease)   . Glaucoma    Pt denies having glaucoma and does not have eye drops.  . Hemorrhoids   . HYPERLIPIDEMIA   . Hypertension   . Hypertensive heart disease   . LVH (left ventricular hypertrophy)  a. 2014 Echo: EF 65-70%, no rwma, Gr1 DD, mild MR.  . Non-obstructive CAD (coronary artery disease)    a. 02/2009 Cath: essentially nl cors; b. 07/2014 MV: mild diaph atten, no ischemia-->low risk.    MEDICATIONS: Current Outpatient Medications on File Prior to Visit  Medication Sig Dispense Refill  . acetaminophen-codeine (TYLENOL #3) 300-30 MG tablet One every 4 hours as needed for cough 16 tablet 0  . albuterol (PROVENTIL) (2.5 MG/3ML)  0.083% nebulizer solution Take 2.5 mg by nebulization every 6 (six) hours as needed for wheezing or shortness of breath.    Marland Kitchen amoxicillin-clavulanate (AUGMENTIN) 875-125 MG tablet Take 1 tablet by mouth 2 (two) times daily.    . brimonidine (ALPHAGAN) 0.15 % ophthalmic solution Place 1 drop into both eyes 3 (three) times daily.    . budesonide-formoterol (SYMBICORT) 80-4.5 MCG/ACT inhaler Inhale 2 puffs into the lungs 2 (two) times daily. 1 Inhaler 0  . hydrochlorothiazide (MICROZIDE) 12.5 MG capsule Take 12.5 mg by mouth daily.    Marland Kitchen linaclotide (LINZESS) 290 MCG CAPS capsule Take 290 mcg by mouth daily before breakfast.    . pantoprazole (PROTONIX) 40 MG tablet Take 1 tablet (40 mg total) by mouth daily as needed (for heartburn). 15 tablet 0  . rivastigmine (EXELON) 1.5 MG capsule Take 1.5 mg by mouth 2 (two) times daily.    . rosuvastatin (CRESTOR) 10 MG tablet Take 10 mg by mouth daily.     No current facility-administered medications on file prior to visit.     ALLERGIES: No Known Allergies  FAMILY HISTORY: Family History  Problem Relation Age of Onset  . Pancreatic cancer Mother 66  . Hypertension Mother   . Aneurysm Father 74       brain  . Prostate cancer Brother        x 2 bro    SOCIAL HISTORY: Social History   Socioeconomic History  . Marital status: Married    Spouse name: Not on file  . Number of children: 8  . Years of education: 8  . Highest education level: Not on file  Social Needs  . Financial resource strain: Not on file  . Food insecurity - worry: Not on file  . Food insecurity - inability: Not on file  . Transportation needs - medical: Not on file  . Transportation needs - non-medical: Not on file  Occupational History  . Occupation: RETIRED    Employer: RETIRED    Comment: MACHINE OPERATOR  Tobacco Use  . Smoking status: Former Smoker    Packs/day: 0.25    Years: 48.00    Pack years: 12.00    Types: Cigarettes    Last attempt to quit: 04/24/2000      Years since quitting: 17.1  . Smokeless tobacco: Never Used  Substance and Sexual Activity  . Alcohol use: No    Alcohol/week: 0.0 oz  . Drug use: No  . Sexual activity: Yes  Other Topics Concern  . Not on file  Social History Narrative   EXERCISES INTERMITTENTLY AT THE GYM      5 GRANDCHILDREN      2 GREAT-CHILDREN            WEARS GLASSES      Lives in one story home with wife.    REVIEW OF SYSTEMS: Constitutional: No fevers, chills, or sweats, no generalized fatigue, change in appetite Eyes: No visual changes, double vision, eye pain Ear, nose and throat: No hearing loss, ear pain, nasal congestion,  sore throat Cardiovascular: No chest pain, palpitations Respiratory:  No shortness of breath at rest or with exertion, wheezes GastrointestinaI: No nausea, vomiting, diarrhea, abdominal pain, fecal incontinence Genitourinary:  No dysuria, urinary retention or frequency Musculoskeletal:  No neck pain, back pain Integumentary: No rash, pruritus, skin lesions Neurological: as above Psychiatric: No depression, insomnia, anxiety Endocrine: No palpitations, fatigue, diaphoresis, mood swings, change in appetite, change in weight, increased thirst Hematologic/Lymphatic:  No anemia, purpura, petechiae. Allergic/Immunologic: no itchy/runny eyes, nasal congestion, recent allergic reactions, rashes  PHYSICAL EXAM: Vitals:   05/31/17 1152  BP: 122/70  Pulse: (!) 108  Resp: 18   General: No acute distress Head:  Normocephalic/atraumatic Neck: supple, no paraspinal tenderness, full range of motion Heart:  Regular rate and rhythm Lungs:  Clear to auscultation bilaterally Back: No paraspinal tenderness Skin/Extremities: No rash, no edema Neurological Exam: alert and oriented to person, place, and season. No aphasia or dysarthria. Fund of knowledge is appropriate.  Recent and remote memory are impaired.  Attention and concentration are reduced, only able to do serial 7s x 1. He  is illiterate and cannot spell or read.    Able to name objects and repeat phrases. CDT 4/5  MMSE - Mini Mental State Exam 05/31/2017 01/18/2017 05/31/2016  Not completed: - (No Data) -  Orientation to time 0 - 1  Orientation to Place 4 - 5  Registration 3 - 3  Attention/ Calculation 1 - 1  Recall 0 - 0  Language- name 2 objects 2 - 2  Language- repeat 1 - 1  Language- follow 3 step command 3 - 3  Language- read & follow direction 0 - 0  Write a sentence 0 - 0  Copy design 1 - 1  Total score 15 - 17   Cranial nerves: Pupils equal, round, reactive to light.Extraocular movements intact with no nystagmus. Visual fields full. Facial sensation intact. No facial asymmetry. Tongue, uvula, palate midline.  Motor: Bulk and tone normal, muscle strength 5/5 throughout with no pronator drift.  Sensation to light touch intact.  No extinction to double simultaneous stimulation.  Deep tendon reflexes 2+ throughout, toes downgoing.  Finger to nose testing intact.  Gait narrow-based and steady, able to tandem walk adequately.  Romberg negative.  IMPRESSION: This is an 84 yo RH man with vascular risk factors including hypertension, hyperlipidemia, and dementia. MMSE today 15/29 (patient illiterate, 16/28 in February 2018, 18/28 in August 2017, 19/28 in March 2016), symptoms suggestive of mild to moderate dementia. MRI brain showed moderate chronic microvascular disease. There has been a gradual decline since his initial visit. He is tolerating Exelon 1.5mg  daily and did not increase dose as instructed on last visit. He was instructed to increase to BID dosing. We again discussed driving concerns previously raised, his wife states driving is fine, continue to monitor. I again advised that his wife be with him at all times when he drives and to monitor driving, stop if any further worsening. The importance of physical exercise and brain stimulation exercises, control of vascular risk factors, for brain health were again  discussed. He will follow-up in 1 year and knows to call for any problems with the medication.  Thank you for allowing me to participate in his care.  Please do not hesitate to call for any questions or concerns.  The duration of this appointment visit was 25 minutes of face-to-face time with the patient.  Greater than 50% of this time was spent in counseling, explanation of diagnosis, planning  of further management, and coordination of care.   Patrcia Dolly, M.D.   CC: Dr. Oliver Barre

## 2017-05-31 NOTE — Patient Instructions (Signed)
1. Increase Rivastigmine 1.5mg : Take 1 capsule twice a day 2. Continue to monitor driving 3. Follow-up in 1 year, call for any changes  FALL PRECAUTIONS: Be cautious when walking. Scan the area for obstacles that may increase the risk of trips and falls. When getting up in the mornings, sit up at the edge of the bed for a few minutes before getting out of bed. Consider elevating the bed at the head end to avoid drop of blood pressure when getting up. Walk always in a well-lit room (use night lights in the walls). Avoid area rugs or power cords from appliances in the middle of the walkways. Use a walker or a cane if necessary and consider physical therapy for balance exercise. Get your eyesight checked regularly.  FINANCIAL OVERSIGHT: Supervision, especially oversight when making financial decisions or transactions is also recommended.  HOME SAFETY: Consider the safety of the kitchen when operating appliances like stoves, microwave oven, and blender. Consider having supervision and share cooking responsibilities until no longer able to participate in those. Accidents with firearms and other hazards in the house should be identified and addressed as well.  DRIVING: Regarding driving, in patients with progressive memory problems, driving will be impaired. We advise to have someone else do the driving if trouble finding directions or if minor accidents are reported. Independent driving assessment is available to determine safety of driving.  ABILITY TO BE LEFT ALONE: If patient is unable to contact 911 operator, consider using LifeLine, or when the need is there, arrange for someone to stay with patients. Smoking is a fire hazard, consider supervision or cessation. Risk of wandering should be assessed by caregiver and if detected at any point, supervision and safe proof recommendations should be instituted.  MEDICATION SUPERVISION: Inability to self-administer medication needs to be constantly addressed.  Implement a mechanism to ensure safe administration of the medications.  RECOMMENDATIONS FOR ALL PATIENTS WITH MEMORY PROBLEMS: 1. Continue to exercise (Recommend 30 minutes of walking everyday, or 3 hours every week) 2. Increase social interactions - continue going to [place] and enjoy social gatherings with friends and family 3. Eat healthy, avoid fried foods and eat more fruits and vegetables 4. Maintain adequate blood pressure, blood sugar, and blood cholesterol level. Reducing the risk of stroke and cardiovascular disease also helps promoting better memory. 5. Avoid stressful situations. Live a simple life and avoid aggravations. Organize your time and prepare for the next day in anticipation. 6. Sleep well, avoid any interruptions of sleep and avoid any distractions in the bedroom that may interfere with adequate sleep quality 7. Avoid sugar, avoid sweets as there is a strong link between excessive sugar intake, diabetes, and cognitive impairment The Mediterranean diet has been shown to help patients reduce the risk of progressive memory disorders and reduces cardiovascular risk. This includes eating fish, eat fruits and green leafy vegetables, nuts like almonds and hazelnuts, walnuts, and also use olive oil. Avoid fast foods and fried foods as much as possible. Avoid sweets and sugar as sugar use has been linked to worsening of memory function.  There is always a concern of gradual progression of memory problems. If this is the case, then we may need to adjust level of care according to patient needs. Support, both to the patient and caregiver, should then be put into place.

## 2017-06-04 ENCOUNTER — Ambulatory Visit: Payer: PPO | Admitting: Internal Medicine

## 2017-06-04 ENCOUNTER — Encounter: Payer: Self-pay | Admitting: Internal Medicine

## 2017-06-04 VITALS — BP 116/80 | HR 94 | Ht 65.0 in | Wt 176.0 lb

## 2017-06-04 DIAGNOSIS — R05 Cough: Secondary | ICD-10-CM

## 2017-06-04 DIAGNOSIS — R058 Other specified cough: Secondary | ICD-10-CM

## 2017-06-04 DIAGNOSIS — J449 Chronic obstructive pulmonary disease, unspecified: Secondary | ICD-10-CM | POA: Diagnosis not present

## 2017-06-04 MED ORDER — FAMOTIDINE 20 MG PO TABS: ORAL_TABLET | ORAL | Status: DC

## 2017-06-04 NOTE — Patient Instructions (Addendum)
No change in medications for now  Keep appt to see Dr Annalee GentaShoemaker     Please schedule a follow up office visit in 6 weeks, call sooner if needed with all medications /inhalers/ solutions in hand so we can verify exactly what you are taking. This includes all medications from all doctors and over the counters  - pfts on return

## 2017-06-04 NOTE — Progress Notes (Signed)
Subjective:    Patient ID: Alexander [name], male    DOB: 1932-05-01, 84 y.o.   MRN: [id number]     Brief patient profile:  84  yobm quit smoking 04/2000    gold III COPD & dyspnea   01/09/13  Cardiac evaluation - echo nml  Stress test - peak VO2 68%, limited by ventilation  c. cath [name]) -11/10 - nml LV fn, nml coronaries  Spirometry >>FEV1 1.14 - 45% -moderate airway obstruction       05/04/2017 acute extended ov/Lynsi Dooner re:  COPD III  Really not clear what meds he's been taking nor is his wife sure Chief Complaint  Patient presents with  . 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  Pantoprazole (protonix) 40 mg   Take  30-60 min before first meal of the day and Pepcid (famotidine)  20 mg one @  bedtime until return to office Prednisone 10 mg take  4 each am x 2 days,   2 each am x 2 days,  1 each am x 2 days and stop   Augmentin 875 mg take one pill twice daily  X 10 days - take at breakfast and supper with large glass of water.  GERD diet  Work on inhaler technique   Please schedule a follow up office visit in 2 weeks, sooner if needed with all medications / inhalers / solutions    05/21/2017  f/u ov/Viney Acocella re:  COPD GOLD III / main problem is coughing > sob  Chief Complaint  Patient presents with  . Follow-up    Cough is about the same. He has not been looking at what color sputum he is producing. He still has abx left over, he has only been taking 1 tablet daily.   Cough was gone to his satisfaction until 3 days prior to OV  Then  acute worse/ min productive/ still on augmentin  Wife has same "cold"   pt brought symbicort 80 sample well past the red line/ again confused with meds Dspnea:  No change = MMRC3 = can't walk 100 yards even at a slow pace at a flat grade s stopping due to sob  / not using neb saba at all or following action plan  Sleep: disrupted by coughing fits now and has to sleep propped up  rec Plan A = Automatic = symbicort 80 Take 2 puffs first thing in am and then another 2 puffs about 12 hours later.  Work on inhaler technique:   Plan B = Backup Only use your albuterol as a rescue medicatio Best cough medicine > delsym 2 tsp every 12 hours and if still coughing supplement with Tylenol #3 Finish your antibiotics Please see patient coordinator before you leave today  to schedule sinus CT  Please remember to go to the lab department downstairs in the basement  for your tests - we will call you with the results when they are available.  Please schedule a follow up office visit in 2 weeks, sooner if needed  with all medications /inhalers/ solutions in hand so we can verify exactly what you are taking. This includes all medications from all doctors and over the counters   06/04/2017  f/u ov/Karman Biswell re:  Ab/ sinusitis  Chief Complaint  Patient presents with  . Follow-up    Increased cough with grey sputum since the last visit.   using saba neb several times a day  Dyspnea:  MMRC2 = can't walk a nl pace on a flat grade s sob but does fine slow and flat  Cough: esp after supper variably productive min slt dark mucus Sleep: ok flat   Main complaints are related to nasal congestion > seeing shoemaker now  No obvious day to day or daytime variability or assoc truly  excess/ purulent sputum or mucus plugs or hemoptysis or cp or chest tightness, subjective wheeze or overt   hb symptoms. No unusual exposure hx or h/o childhood pna/ asthma or knowledge of premature birth.  Sleeping ok flat without nocturnal  or  early am exacerbation  of respiratory  c/o's or need for noct saba. Also denies any obvious fluctuation of symptoms with weather or environmental changes or other aggravating or alleviating factors except as outlined above   Current Allergies, Complete Past Medical History, Past Surgical History, Family History, and Social History were reviewed in [employer] record.  ROS  The following are not active complaints unless bolded Hoarseness, sore throat, dysphagia, dental problems, itching, sneezing,  nasal congestion or discharge of excess mucus or purulent secretions, ear ache,   fever, chills, sweats, unintended wt loss or wt gain, classically pleuritic or exertional cp,  orthopnea pnd or leg swelling, presyncope, palpitations, abdominal pain, anorexia, nausea, vomiting, diarrhea  or change in bowel habits or change in bladder habits, change in stools or change in urine, dysuria, hematuria,  rash, arthralgias, visual complaints, headache, numbness, weakness or ataxia or problems with walking or coordination,  change in mood/affect or memory.        Current Meds  Medication Sig  . acetaminophen-codeine (TYLENOL #3) 300-30 MG tablet One every 4 hours as needed for cough  . albuterol (PROVENTIL) (2.5 MG/3ML) 0.083% nebulizer solution Take 2.5 mg by nebulization every 6 (six) hours as needed for wheezing or shortness of breath.  . brimonidine (ALPHAGAN) 0.15 % ophthalmic solution Place 1 drop into both eyes 3 (three) times daily.  . budesonide-formoterol (SYMBICORT) 80-4.5 MCG/ACT inhaler Inhale 2 puffs into the lungs 2 (two) times daily.  . hydrochlorothiazide (MICROZIDE) 12.5 MG capsule Take 12.5 mg by mouth daily.  Marland Kitchen linaclotide (LINZESS) 290 MCG CAPS capsule Take 290 mcg by mouth daily before breakfast.  . pantoprazole (PROTONIX) 40 MG tablet Take 1 tablet (40 mg total) by mouth daily as needed (for heartburn).  . rivastigmine (EXELON) 1.5 MG capsule Take 1 capsule (1.5 mg  total) by mouth 2 (two) times daily.  . rosuvastatin (CRESTOR) 10 MG tablet Take 10 mg by mouth daily.              Objective:   Physical Exam  amb bm nad  Vital signs reviewed - Note on arrival 02 sats  99% on RA        06/04/2017      176  05/21/2017       175  05/04/2017       173   03/17/2016    182   02/03/14  177 lb (80.287 kg)  01/27/14 177 lb 12 oz (80.627 kg)  01/06/14 173 lb 8 oz (78.699 kg)        HEENT: nl   Oropharynx s excess pnd. Nl external ear canals without cough reflex - mild bilateral non-specific turbinate edema /full dentures   Lungs clear   HEENT: nl   oropharynx. Nl external ear canals without cough reflex- moderate bilateral non-specific turbinate edema  With mp secretions bilaterally and full dentures   NECK :  without JVD/Nodes/TM/ nl carotid upstrokes bilaterally   LUNGS: no acc muscle use,  Nl contour chest which is clear to A and P bilaterally without cough on insp or exp maneuvers   CV:  RRR  no s3 or murmur or increase in P2, and no edema   ABD:  soft and nontender with nl inspiratory excursion in the supine position. No bruits or organomegaly appreciated, bowel sounds nl  MS:  Nl gait/ ext warm without deformities, calf tenderness, cyanosis or clubbing No obvious joint restrictions   SKIN: warm and dry without lesions    NEURO:  alert, approp, nl sensorium with  no motor or cerebellar deficits apparent.                       Assessment & Plan:

## 2017-06-05 DIAGNOSIS — J449 Chronic obstructive pulmonary disease, unspecified: Secondary | ICD-10-CM | POA: Diagnosis not present

## 2017-06-05 DIAGNOSIS — J310 Chronic rhinitis: Secondary | ICD-10-CM | POA: Diagnosis not present

## 2017-06-05 DIAGNOSIS — J324 Chronic pansinusitis: Secondary | ICD-10-CM | POA: Diagnosis not present

## 2017-06-06 ENCOUNTER — Encounter: Payer: Self-pay | Admitting: Internal Medicine

## 2017-06-06 NOTE — Assessment & Plan Note (Addendum)
Spirometry 09/27/12  FEV1  1.06 (44%) ratio 47  - Spirometry 05/04/2017  FEV1 0.32 (16%)  Ratio 81 but f/v not physiologic   - 05/04/2017    > try symb 80 2bid as cough is the primary concern  - 05/21/2017  After extensive coaching inhaler device  effectiveness =    75% from a baseline of 25%   - The proper method of use, as well as anticipated side effects, of a metered-dose inhaler are discussed and demonstrated to the patient.   Adequate control on present rx, reviewed in detail with pt > no change in rx needed  > needs full pfts on return with all meds in hand using a trust but verify approach to confirm accurate Medication  Reconciliation The principal here is that until we are certain that the  patients are doing what we've asked, it makes no sense to ask them to do more.

## 2017-06-06 NOTE — Assessment & Plan Note (Signed)
Allergy profile 05/21/2017 >  Eos 0.2 /  IgE  15 RAST neg -  Sinus CT 05/29/2017 1. Bilateral maxillary sinusitis with fluid levels. 2. Chronic left sphenoid sinusitis with mild mucosal thickening > f/u shoemaker

## 2017-06-29 ENCOUNTER — Ambulatory Visit: Payer: PPO | Admitting: Internal Medicine

## 2017-06-29 DIAGNOSIS — Z0289 Encounter for other administrative examinations: Secondary | ICD-10-CM

## 2017-07-03 ENCOUNTER — Telehealth: Payer: Self-pay | Admitting: Internal Medicine

## 2017-07-03 NOTE — Telephone Encounter (Signed)
I got paged 5:20 PM 07/03/2017 because I am on call. THe wife is paging. Please call and find out what it is. I wil be in epic for another 15 min  Dr. Kalman ShanMurali Azalyn Sliwa, M.D., [facility]C.P Pulmonary and Critical Care Medicine Staff Physician, [facility] Director - Interstitial Lung Disease  Program  Pulmonary Fibrosis [facility] [place], [place], [id number]  Pager: [phone], If no answer or between  15:00h - 7:00h: call [phone] Telephone: [phone]

## 2017-07-03 NOTE — Telephone Encounter (Signed)
LM detailed message on VM for pt's wife to see what is going on.

## 2017-07-09 DIAGNOSIS — J324 Chronic pansinusitis: Secondary | ICD-10-CM | POA: Diagnosis not present

## 2017-07-09 DIAGNOSIS — R05 Cough: Secondary | ICD-10-CM | POA: Diagnosis not present

## 2017-07-09 DIAGNOSIS — J320 Chronic maxillary sinusitis: Secondary | ICD-10-CM | POA: Diagnosis not present

## 2017-07-17 ENCOUNTER — Encounter: Payer: Self-pay | Admitting: Internal Medicine

## 2017-07-17 ENCOUNTER — Ambulatory Visit (INDEPENDENT_AMBULATORY_CARE_PROVIDER_SITE_OTHER): Payer: PPO | Admitting: Internal Medicine

## 2017-07-17 ENCOUNTER — Ambulatory Visit: Payer: PPO | Admitting: Internal Medicine

## 2017-07-17 VITALS — BP 126/74 | HR 100 | Ht 63.0 in | Wt 173.6 lb

## 2017-07-17 DIAGNOSIS — J449 Chronic obstructive pulmonary disease, unspecified: Secondary | ICD-10-CM

## 2017-07-17 DIAGNOSIS — R05 Cough: Secondary | ICD-10-CM

## 2017-07-17 DIAGNOSIS — R058 Other specified cough: Secondary | ICD-10-CM

## 2017-07-17 LAB — PULMONARY FUNCTION TEST
DL/VA % PRED: 72 %
DL/VA: 2.93 ml/min/mmHg/L
DLCO UNC: 12.58 ml/min/mmHg
DLCO unc % pred: 54 %
FEF 25-75 POST: 0.7 L/s
FEF 25-75 Pre: 0.41 L/sec
FEF2575-%Change-Post: 69 %
FEF2575-%Pred-Post: 52 %
FEF2575-%Pred-Pre: 30 %
FEV1-%CHANGE-POST: 20 %
FEV1-%PRED-PRE: 53 %
FEV1-%Pred-Post: 64 %
FEV1-PRE: 0.97 L
FEV1-Post: 1.17 L
FEV1FVC-%Change-Post: -8 %
FEV1FVC-%PRED-PRE: 71 %
FEV6-%Change-Post: 30 %
FEV6-%Pred-Post: 94 %
FEV6-%Pred-Pre: 72 %
FEV6-Post: 2.28 L
FEV6-Pre: 1.75 L
FEV6FVC-%Change-Post: -1 %
FEV6FVC-%Pred-Post: 103 %
FEV6FVC-%Pred-Pre: 104 %
FVC-%Change-Post: 31 %
FVC-%PRED-POST: 92 %
FVC-%PRED-PRE: 69 %
FVC-POST: 2.4 L
FVC-PRE: 1.82 L
POST FEV1/FVC RATIO: 49 %
PRE FEV6/FVC RATIO: 96 %
Post FEV6/FVC ratio: 95 %
Pre FEV1/FVC ratio: 53 %
RV % pred: 208 %
RV: 4.83 L
TLC % pred: 135 %
TLC: 7.63 L

## 2017-07-17 MED ORDER — BUDESONIDE-FORMOTEROL FUMARATE 160-4.5 MCG/ACT IN AERO: 2.0000 | INHALATION_SPRAY | Freq: Two times a day (BID) | RESPIRATORY_TRACT | 11 refills | Status: DC

## 2017-07-17 MED ORDER — BUDESONIDE-FORMOTEROL FUMARATE 160-4.5 MCG/ACT IN AERO: 2.0000 | INHALATION_SPRAY | Freq: Two times a day (BID) | RESPIRATORY_TRACT | 0 refills | Status: DC

## 2017-07-17 NOTE — Assessment & Plan Note (Signed)
Spirometry 09/27/12  FEV1  1.06 (44%) ratio 47  - Spirometry 05/04/2017  FEV1 0.32 (16%)  Ratio 81 but f/v not physiologic   - 05/04/2017    > try symb 80 2bid as cough is the primary concern  - 05/21/2017  After extensive coaching inhaler device  effectiveness =    75% from a baseline of 25% - PFT's  07/17/2017  FEV1 1.17 (64 % ) ratio 64  p 20 % improvement from saba p nothing prior to study with DLCO  54 % corrects to 72  % for alv volume    - 07/17/2017  After extensive coaching inhaler device  effectiveness =   75% from a baseline of 25% (poor trigger on insp/ short Ti)    He has significant reversiblity plus severe underlying copd so best rx is probably laba/ics until / unless the trigger (sinusitis) is eliminated in which case we could try laba/lama.   Discussed in detail all the  indications, usual  risks and alternatives  relative to the benefits with patient who agrees to proceed with w/u as outlined.     F/u Annalee GentaShoemaker as planned for FESS   Here q 3 m   I had an extended discussion with the patient and wife reviewing all relevant studies completed to date and  lasting 15 to 20 minutes of a 25 minute visit    Each maintenance medication was reviewed in detail including most importantly the difference between maintenance and prns and under what circumstances the prns are to be triggered using an action plan format that is not reflected in the computer generated alphabetically organized AVS.    Please see AVS for specific instructions unique to this visit that I personally wrote and verbalized to the the pt in detail and then reviewed with pt  by my nurse highlighting any  changes in therapy recommended at today's visit to their plan of care.

## 2017-07-17 NOTE — Progress Notes (Signed)
Subjective:    Patient ID: Alexander [name], male    DOB: 1932-05-01, 84 y.o.   MRN: [id number]     Brief patient profile:  84  yobm quit smoking 04/2000    gold III COPD & dyspnea   01/09/13  Cardiac evaluation - echo nml  Stress test - peak VO2 68%, limited by ventilation  c. cath [name]) -11/10 - nml LV fn, nml coronaries  Spirometry >>FEV1 1.14 - 45% -moderate airway obstruction       05/04/2017 acute extended ov/Wert re:  COPD III  Really not clear what meds he's been taking nor is his wife sure Chief Complaint  Patient presents with  . 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  Pantoprazole (protonix) 40 mg   Take  30-60 min before first meal of the day and Pepcid (famotidine)  20 mg one @  bedtime until return to office Prednisone 10 mg take  4 each am x 2 days,   2 each am x 2 days,  1 each am x 2 days and stop   Augmentin 875 mg take one pill twice daily  X 10 days - take at breakfast and supper with large glass of water.  GERD diet  Work on inhaler technique   Please schedule a follow up office visit in 2 weeks, sooner if needed with all medications / inhalers / solutions    05/21/2017  f/u ov/Wert re:  COPD GOLD III / main problem is coughing > sob  Chief Complaint  Patient presents with  . Follow-up    Cough is about the same. He has not been looking at what color sputum he is producing. He still has abx left over, he has only been taking 1 tablet daily.   Cough was gone to his satisfaction until 3 days prior to OV  Then  acute worse/ min productive/ still on augmentin  Wife has same "cold"   pt brought symbicort 80 sample well past the red line/ again confused with meds Dspnea:  No change = MMRC3 = can't walk 100 yards even at a slow pace at a flat grade s stopping due to sob  / not using neb saba at all or following action plan  Sleep: disrupted by coughing fits now and has to sleep propped up  rec Plan A = Automatic = symbicort 80 Take 2 puffs first thing in am and then another 2 puffs about 12 hours later.  Work on inhaler technique:   Plan B = Backup Only use your albuterol as a rescue medicatio Best cough medicine > delsym 2 tsp every 12 hours and if still coughing supplement with Tylenol #3 Finish your antibiotics Please see patient coordinator before you leave today  to schedule sinus CT  Please remember to go to the lab department downstairs in the basement  for your tests - we will call you with the results when they are available.  Please schedule a follow up office visit in 2 weeks, sooner if needed  with all medications /inhalers/ solutions in hand so we can verify exactly what you are taking. This includes all medications from all doctors and over the counters   06/04/2017  f/u ov/Wert re:  Ab/ sinusitis  Chief Complaint  Patient presents with  . Follow-up    Increased cough with grey sputum since the last visit.   using saba neb several times a day  Dyspnea:  MMRC2 = can't walk a nl pace on a flat grade s sob but does fine slow and flat  Cough: esp after supper variably productive min slt dark mucus Sleep: ok flat   Main complaints are related to nasal congestion   rec No change in medications for now Keep appt to see Dr Annalee GentaShoemaker > rec FESS  Please schedule a follow up office visit in 6 weeks, call sooner if needed with all medications /inhalers/ solutions in hand so we can verify exactly what you are taking. This includes all medications from all doctors and over the counters  - pfts  on return       07/17/2017  f/u ov/Wert re:  COPD III/ did not bring meds as req but using symb prn rather than 2 bid  Chief Complaint  Patient presents with  . Follow-up    PFT's done today. He is coughing less, but still producing some green to grey colored sputum.     Dyspnea:   MMRC2 = can't walk a nl pace on a flat grade s sob but does fine slow and flat  Cough: better p abx with gen ant bilateral cp provoked by cough resolved also  Sleep: ok now  SABA use:  Has neb, not needing    No obvious day to day or daytime variability or assoc  mucus plugs or hemoptysis or cp or chest tightness, subjective wheeze or overt sinus or hb symptoms. No unusual exposure hx or h/o childhood pna/ asthma or knowledge of premature birth.  Sleeping ok flat without nocturnal  or early am exacerbation  of respiratory  c/o's or need for noct saba. Also denies any obvious fluctuation of symptoms with weather or environmental changes or other aggravating or alleviating factors except as outlined above   Current Allergies, Complete Past Medical History, Past Surgical History, Family History, and Social History were [facility].  ROS  The following are not active complaints unless bolded Hoarseness, sore throat, dysphagia, dental problems, itching, sneezing,  nasal congestion or discharge of excess mucus or purulent secretions, ear ache,   fever, chills, sweats, unintended wt loss or wt gain, classically pleuritic or exertional cp,  orthopnea pnd or leg swelling, presyncope, palpitations, abdominal pain, anorexia, nausea, vomiting, diarrhea  or change in bowel habits or change in bladder habits, change in stools or change in urine, dysuria, hematuria,  rash, arthralgias, visual complaints, headache, numbness, weakness or ataxia or problems with walking or coordination,  change in mood/affect or memory.        Current Meds  Medication Sig  . acetaminophen-codeine  (TYLENOL #3) 300-30 MG tablet One every 4 hours as needed for cough  . albuterol (PROVENTIL) (2.5 MG/3ML) 0.083% nebulizer solution Take 2.5 mg by nebulization every 6 (six) hours as needed for wheezing or shortness of breath.  . brimonidine (ALPHAGAN) 0.15 % ophthalmic solution Place 1 drop into both eyes 3 (three) times daily.  . famotidine (PEPCID) 20 MG tablet One  at bedtime  . hydrochlorothiazide (MICROZIDE) 12.5 MG capsule Take 12.5 mg by mouth daily.  Marland Kitchen linaclotide (LINZESS) 290 MCG CAPS capsule Take 290 mcg by mouth daily before breakfast.  . pantoprazole (PROTONIX) 40 MG tablet Take 1 tablet (40 mg total) by mouth daily as needed (for heartburn).  . rivastigmine (EXELON) 1.5 MG capsule Take 1 capsule (1.5 mg total) by mouth 2 (two) times daily.  . rosuvastatin (CRESTOR) 10 MG tablet Take 10 mg by mouth daily.  . [DISCONTINUED] budesonide-formoterol (SYMBICORT) 80-4.5 MCG/ACT inhaler Inhale 2 puffs into the lungs 2 (two) times daily.               Objective:   Physical Exam  amb bm nad      07/17/2017       173   06/04/2017      176  05/21/2017       175  05/04/2017       173   03/17/2016    182   02/03/14 177 lb (80.287 kg)  01/27/14 177 lb 12 oz (80.627 kg)  01/06/14 173 lb 8 oz (78.699 kg)     HEENT: nl dentition, turbinates bilaterally, and oropharynx. Nl external ear canals without cough reflex - full dentures/ moderate bilateral non-specific turbinate edema     NECK :  without JVD/Nodes/TM/ nl carotid upstrokes bilaterally   LUNGS: no acc muscle use,  Nl contour chest with distant bs bilaterally without wheeze or cough on insp or exp maneuvers   CV:  RRR  no s3 or murmur or increase in P2, and no edema   ABD:  soft and nontender with nl inspiratory excursion in the supine position. No bruits or organomegaly appreciated, bowel sounds nl  MS:  Nl gait/ ext warm without deformities, calf tenderness, cyanosis or clubbing No obvious joint restrictions   SKIN: warm  and dry without lesions    NEURO:  alert, approp, nl sensorium with  no motor or cerebellar deficits apparent.                      Assessment & Plan:

## 2017-07-17 NOTE — Assessment & Plan Note (Signed)
Allergy profile 05/21/2017 >  Eos 0.2 /  IgE  15 RAST neg -  Sinus CT 05/29/2017 1. Bilateral maxillary sinusitis with fluid levels. 2. Chronic left sphenoid sinusitis with mild mucosal thickening > f/u shoemaker: ov 07/09/17 c/w persistent sinusitis > rec FESS  Reviewed ent office notes/ recs with pt/ wife

## 2017-07-17 NOTE — Progress Notes (Signed)
PFT completed today 07/17/17  

## 2017-07-17 NOTE — Patient Instructions (Signed)
Change symbicort to 160 Take 2 puffs first thing in am and then another 2 puffs about 12 hours later.     If no difference between the 160 and the 80 strength ok to resume the 80   Work on inhaler technique:  relax and gently blow all the way out then take a nice smooth deep breath back in, triggering the inhaler at same time you start breathing in.  Hold for up to 5 seconds if you can. Blow out thru nose. Rinse and gargle with water when done    Keep up appts with Dr Annalee GentaShoemaker and proceed with surgery if he recommends it.

## 2017-07-18 ENCOUNTER — Other Ambulatory Visit: Payer: Self-pay | Admitting: Internal Medicine

## 2017-07-24 ENCOUNTER — Inpatient Hospital Stay (HOSPITAL_COMMUNITY): Admission: RE | Admit: 2017-07-24 | Payer: PPO | Source: Ambulatory Visit

## 2017-07-27 ENCOUNTER — Encounter (HOSPITAL_COMMUNITY): Admission: RE | Payer: Self-pay | Source: Ambulatory Visit

## 2017-07-27 ENCOUNTER — Ambulatory Visit (HOSPITAL_COMMUNITY): Admission: RE | Admit: 2017-07-27 | Payer: PPO | Source: Ambulatory Visit | Admitting: Otolaryngology

## 2017-07-27 SURGERY — SINUS SURGERY, ENDOSCOPIC, USING COMPUTER-ASSISTED NAVIGATION
Anesthesia: General

## 2017-07-31 DIAGNOSIS — Z86010 Personal history of colonic polyps: Secondary | ICD-10-CM | POA: Diagnosis not present

## 2017-07-31 DIAGNOSIS — K5730 Diverticulosis of large intestine without perforation or abscess without bleeding: Secondary | ICD-10-CM | POA: Diagnosis not present

## 2017-07-31 DIAGNOSIS — K219 Gastro-esophageal reflux disease without esophagitis: Secondary | ICD-10-CM | POA: Diagnosis not present

## 2017-07-31 DIAGNOSIS — K5904 Chronic idiopathic constipation: Secondary | ICD-10-CM | POA: Diagnosis not present

## 2017-08-20 ENCOUNTER — Ambulatory Visit: Payer: PPO | Admitting: Endocrinology

## 2017-08-20 DIAGNOSIS — Z0289 Encounter for other administrative examinations: Secondary | ICD-10-CM

## 2017-08-24 ENCOUNTER — Emergency Department (HOSPITAL_COMMUNITY)
Admission: EM | Admit: 2017-08-24 | Discharge: 2017-08-24 | Disposition: A | Discharge status: Home / Self Care | Payer: PPO | Attending: Emergency Medicine | Admitting: Emergency Medicine

## 2017-08-24 ENCOUNTER — Other Ambulatory Visit: Payer: Self-pay

## 2017-08-24 ENCOUNTER — Emergency Department (HOSPITAL_COMMUNITY): Payer: PPO

## 2017-08-24 ENCOUNTER — Encounter (HOSPITAL_COMMUNITY): Payer: Self-pay

## 2017-08-24 DIAGNOSIS — I2510 Atherosclerotic heart disease of native coronary artery without angina pectoris: Secondary | ICD-10-CM | POA: Insufficient documentation

## 2017-08-24 DIAGNOSIS — K5904 Chronic idiopathic constipation: Secondary | ICD-10-CM | POA: Insufficient documentation

## 2017-08-24 DIAGNOSIS — I10 Essential (primary) hypertension: Secondary | ICD-10-CM | POA: Insufficient documentation

## 2017-08-24 DIAGNOSIS — K5900 Constipation, unspecified: Secondary | ICD-10-CM | POA: Diagnosis not present

## 2017-08-24 DIAGNOSIS — J449 Chronic obstructive pulmonary disease, unspecified: Secondary | ICD-10-CM | POA: Insufficient documentation

## 2017-08-24 DIAGNOSIS — Z79899 Other long term (current) drug therapy: Secondary | ICD-10-CM | POA: Diagnosis not present

## 2017-08-24 LAB — COMPREHENSIVE METABOLIC PANEL
ALBUMIN: 3.7 g/dL (ref 3.5–5.0)
ALK PHOS: 66 U/L (ref 38–126)
ALT: 12 U/L — AB (ref 17–63)
ANION GAP: 8 (ref 5–15)
AST: 21 U/L (ref 15–41)
BUN: 14 mg/dL (ref 6–20)
CALCIUM: 8.8 mg/dL — AB (ref 8.9–10.3)
CHLORIDE: 100 mmol/L — AB (ref 101–111)
CO2: 24 mmol/L (ref 22–32)
CREATININE: 0.95 mg/dL (ref 0.61–1.24)
GFR calc Af Amer: >60 mL/min (ref >60)
GFR calc non Af Amer: >60 mL/min (ref >60)
GLUCOSE: 98 mg/dL (ref 65–99)
Potassium: 4.1 mmol/L (ref 3.5–5.1)
SODIUM: 132 mmol/L — AB (ref 135–145)
Total Bilirubin: 1.2 mg/dL (ref 0.3–1.2)
Total Protein: 6.8 g/dL (ref 6.5–8.1)

## 2017-08-24 LAB — CBC
HCT: 34.7 % — ABNORMAL LOW (ref 39.0–52.0)
HEMOGLOBIN: 11.8 g/dL — AB (ref 13.0–17.0)
MCH: 31.2 pg (ref 26.0–34.0)
MCHC: 34 g/dL (ref 30.0–36.0)
MCV: 91.8 fL (ref 78.0–100.0)
Platelets: 266 10*3/uL (ref 150–400)
RBC: 3.78 MIL/uL — AB (ref 4.22–5.81)
RDW: 12.2 % (ref 11.5–15.5)
WBC: 7.4 10*3/uL (ref 4.0–10.5)

## 2017-08-24 LAB — URINALYSIS, ROUTINE W REFLEX MICROSCOPIC
BACTERIA UA: NONE SEEN
Bilirubin Urine: NEGATIVE
Glucose, UA: NEGATIVE mg/dL
Ketones, ur: NEGATIVE mg/dL
Leukocytes, UA: NEGATIVE
Nitrite: NEGATIVE
PROTEIN: NEGATIVE mg/dL
SPECIFIC GRAVITY, URINE: 1.003 — AB (ref 1.005–1.030)
pH: 6 (ref 5.0–8.0)

## 2017-08-24 LAB — LIPASE, BLOOD: LIPASE: 27 U/L (ref 11–51)

## 2017-08-24 MED ORDER — PEG 3350-KCL-NABCB-NACL-NASULF 227.1 G PO PACK: 1.0000 | PACK | Freq: Every day | ORAL | 0 refills | Status: AC

## 2017-08-24 MED ORDER — BISACODYL 10 MG RE SUPP: 10.0000 mg | RECTAL | 0 refills | Status: DC | PRN

## 2017-08-24 MED ORDER — BISACODYL 10 MG RE SUPP: 10.0000 mg | Freq: Once | RECTAL | Status: DC

## 2017-08-24 MED ORDER — BISACODYL 10 MG RE SUPP
10.0000 mg | Freq: Once | RECTAL | Status: AC
  Administered 2017-08-24: 10 mg via RECTAL
  Filled 2017-08-24: qty 1
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
## 2017-08-24 NOTE — ED Notes (Signed)
Patient in restroom.

## 2017-08-24 NOTE — Discharge Instructions (Addendum)
Based on our work-up in the ER, we think that you are having severe constipation.  We are discharging you home with prescription for GoLYTELY and also rectal suppository for the constipation.  Return to the ER immediately if you start having severe pain, worsening nausea or vomiting.  Also return to the ER if you stop passing gas from behind completely.  We recommend that you continue seeing a primary doctor.  It is possible that you might need to get referral to a GI doctor.

## 2017-08-24 NOTE — ED Notes (Signed)
Patient transported to X-ray 

## 2017-08-27 ENCOUNTER — Ambulatory Visit: Payer: Self-pay | Admitting: *Deleted

## 2017-08-27 ENCOUNTER — Other Ambulatory Visit (INDEPENDENT_AMBULATORY_CARE_PROVIDER_SITE_OTHER): Payer: PPO

## 2017-08-27 ENCOUNTER — Encounter: Payer: Self-pay | Admitting: Internal Medicine

## 2017-08-27 ENCOUNTER — Ambulatory Visit (INDEPENDENT_AMBULATORY_CARE_PROVIDER_SITE_OTHER): Payer: PPO | Admitting: Internal Medicine

## 2017-08-27 VITALS — BP 126/84 | HR 94 | Temp 97.9°F | Ht 65.0 in | Wt 168.0 lb

## 2017-08-27 DIAGNOSIS — E7800 Pure hypercholesterolemia, unspecified: Secondary | ICD-10-CM | POA: Diagnosis not present

## 2017-08-27 DIAGNOSIS — R739 Hyperglycemia, unspecified: Secondary | ICD-10-CM

## 2017-08-27 DIAGNOSIS — R1084 Generalized abdominal pain: Secondary | ICD-10-CM | POA: Insufficient documentation

## 2017-08-27 DIAGNOSIS — E871 Hypo-osmolality and hyponatremia: Secondary | ICD-10-CM | POA: Diagnosis not present

## 2017-08-27 DIAGNOSIS — R634 Abnormal weight loss: Secondary | ICD-10-CM | POA: Diagnosis not present

## 2017-08-27 DIAGNOSIS — D649 Anemia, unspecified: Secondary | ICD-10-CM

## 2017-08-27 LAB — URINALYSIS, ROUTINE W REFLEX MICROSCOPIC
BILIRUBIN URINE: NEGATIVE
KETONES UR: NEGATIVE
LEUKOCYTES UA: NEGATIVE
NITRITE: NEGATIVE
Specific Gravity, Urine: <=1.005 — AB (ref 1.000–1.030)
TOTAL PROTEIN, URINE-UPE24: NEGATIVE
Urine Glucose: NEGATIVE
Urobilinogen, UA: 0.2 (ref 0.0–1.0)
pH: 6 (ref 5.0–8.0)

## 2017-08-27 LAB — HEPATIC FUNCTION PANEL
ALT: 10 U/L (ref 0–53)
AST: 18 U/L (ref 0–37)
Albumin: 4 g/dL (ref 3.5–5.2)
Alkaline Phosphatase: 63 U/L (ref 39–117)
BILIRUBIN DIRECT: 0.1 mg/dL (ref 0.0–0.3)
TOTAL PROTEIN: 6.7 g/dL (ref 6.0–8.3)
Total Bilirubin: 0.4 mg/dL (ref 0.2–1.2)

## 2017-08-27 LAB — LIPID PANEL
CHOLESTEROL: 176 mg/dL (ref 0–200)
HDL: 45 mg/dL (ref >39.00)
LDL Cholesterol: 115 mg/dL — ABNORMAL HIGH (ref 0–99)
NonHDL: 131.42
Total CHOL/HDL Ratio: 4
Triglycerides: 81 mg/dL (ref 0.0–149.0)
VLDL: 16.2 mg/dL (ref 0.0–40.0)

## 2017-08-27 LAB — BASIC METABOLIC PANEL
BUN: 12 mg/dL (ref 6–23)
CHLORIDE: 103 meq/L (ref 96–112)
CO2: 28 mEq/L (ref 19–32)
Calcium: 9.2 mg/dL (ref 8.4–10.5)
Creatinine, Ser: 0.95 mg/dL (ref 0.40–1.50)
GFR: 97.5 mL/min (ref >60.00)
Glucose, Bld: 104 mg/dL — ABNORMAL HIGH (ref 70–99)
POTASSIUM: 4.3 meq/L (ref 3.5–5.1)
SODIUM: 140 meq/L (ref 135–145)

## 2017-08-27 LAB — CBC WITH DIFFERENTIAL/PLATELET
BASOS PCT: 0.9 % (ref 0.0–3.0)
Basophils Absolute: 0 10*3/uL (ref 0.0–0.1)
Eosinophils Absolute: 0.2 10*3/uL (ref 0.0–0.7)
Eosinophils Relative: 5 % (ref 0.0–5.0)
HEMATOCRIT: 36.5 % — AB (ref 39.0–52.0)
HEMOGLOBIN: 12.2 g/dL — AB (ref 13.0–17.0)
LYMPHS PCT: 32.7 % (ref 12.0–46.0)
Lymphs Abs: 1.5 10*3/uL (ref 0.7–4.0)
MCHC: 33.4 g/dL (ref 30.0–36.0)
MCV: 94.2 fl (ref 78.0–100.0)
Monocytes Absolute: 0.6 10*3/uL (ref 0.1–1.0)
Monocytes Relative: 13.2 % — ABNORMAL HIGH (ref 3.0–12.0)
NEUTROS ABS: 2.2 10*3/uL (ref 1.4–7.7)
Neutrophils Relative %: 48.2 % (ref 43.0–77.0)
PLATELETS: 316 10*3/uL (ref 150.0–400.0)
RBC: 3.88 Mil/uL — ABNORMAL LOW (ref 4.22–5.81)
RDW: 12.6 % (ref 11.5–15.5)
WBC: 4.5 10*3/uL (ref 4.0–10.5)

## 2017-08-27 LAB — IBC PANEL
Iron: 58 ug/dL (ref 42–165)
Saturation Ratios: 19 % — ABNORMAL LOW (ref 20.0–50.0)
TRANSFERRIN: 218 mg/dL (ref 212.0–360.0)

## 2017-08-27 LAB — TSH: TSH: 1.22 u[IU]/mL (ref 0.35–4.50)

## 2017-08-27 LAB — HEMOGLOBIN A1C: HEMOGLOBIN A1C: 5.8 % (ref 4.6–6.5)

## 2017-08-27 LAB — VITAMIN B12: Vitamin B-12: 330 pg/mL (ref 211–911)

## 2017-08-27 MED ORDER — LACTULOSE 10 GM/15ML PO SOLN: 30.0000 g | Freq: Two times a day (BID) | ORAL | 5 refills | Status: DC | PRN

## 2017-08-27 NOTE — Patient Instructions (Signed)
Please take all new medication as prescribed - the lactulose  Please continue all other medications as before, except for the Trulance since you have stopped  Please have the pharmacy call with any other refills you may need.  Please continue your efforts at being more active, low cholesterol diet, and weight control.  Please keep your appointments with your specialists as you may have planned  You will be contacted regarding the referral for: CT scan for the abdomen and pelvis  Please go to the LAB in the Basement (turn left off the elevator) for the tests to be done today  You will be contacted by phone if any changes need to be made immediately.  Otherwise, you will receive a letter about your results with an explanation, but please check with MyChart first.  Please remember to sign up for MyChart if you have not done so, as this will be important to you in the future with finding out test results, communicating by private email, and scheduling acute appointments online when needed.  Please return in 3 months, or sooner if needed

## 2017-08-27 NOTE — Assessment & Plan Note (Signed)
Mild new onset, ? Chronic dz but check iron, b12

## 2017-08-27 NOTE — Assessment & Plan Note (Signed)
Lab Results  Component Value Date   HGBA1C 5.8 08/27/2017  stable overall by history and exam, recent data reviewed with pt, and pt to continue medical treatment as before,  to f/u any worsening symptoms or concerns

## 2017-08-27 NOTE — Telephone Encounter (Signed)
Noted  

## 2017-08-27 NOTE — Telephone Encounter (Signed)
Pt's wife is stating her husband has not had a bm in a week. He denies bloating, abd pain, rectal pain, fever, nausea or vomiting. He was in the emergency department on Friday and was given a suppostitory and golytely to take home. An appointment already scheduled for this morning with his pcp.  Home care advice given to pt's wife with verbal understanding. Will route to flow at [facility] at primary care at [facility] Reason for Disposition . [1] Constipation persists > 1 week AND [2] following Constipation Care Advice  Answer Assessment - Initial Assessment Questions 1. STOOL PATTERN OR FREQUENCY: "How often do you pass bowel movements (BMs)?"  (Normal range: tid to q 3 days)  "When was the last BM passed?"       Normally every other day 2. STRAINING: "Do you have to strain to have a BM?"      yes 3. RECTAL PAIN: "Does your rectum hurt when the stool comes out?" If so, ask: "Do you have hemorrhoids? How bad is the pain?"  (Scale 1-10; or mild, moderate, severe)     Hemorrhoids, no pain 4. STOOL COMPOSITION: "Are the stools hard?"      No stools 5. BLOOD ON STOOLS: "Has there been any blood on the toilet tissue or on the surface of the BM?" If so, ask: "When was the last time?"      Sometimes blood on the tissue when wiping, still no bm 6. CHRONIC CONSTIPATION: "Is this a new problem for you?"  If no, ask: How long have you had this problem?" (days, weeks, months)      Has hx of constipation and it was last Monday  7. CHANGES IN DIET: "Have there been any recent changes in your diet?"      Liquid diet since last Friday 8. MEDICATIONS: "Have you been taking any new medications?"     no 9. LAXATIVES: "Have you been using any laxatives or enemas?"  If yes, ask "What, how often, and when was the last time?"     Yes, laxatives and enemas 10. CAUSE: "What do you think is causing the constipation?"        no 11. OTHER SYMPTOMS: "Do you have any other symptoms?" (e.g., abdominal pain, fever, vomiting)     no  Protocols used: CONSTIPATION-A-AH

## 2017-08-27 NOTE — Assessment & Plan Note (Signed)
Lab Results  Component Value Date   LDLCALC 115 (H) 08/27/2017  for lower chol diet

## 2017-08-27 NOTE — Assessment & Plan Note (Signed)
For labs as documented,  to f/u any worsening symptoms or concerns
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
## 2017-08-27 NOTE — Assessment & Plan Note (Addendum)
Etiology unclear, possible constipation but cant r/o other with lower appetite, wt loss  - for CT abd/pelvis, labs as ordered  Note:  Total time for pt hx, exam, review of record with pt in the room, determination of diagnoses and plan for further eval and tx is > 40 min, with over 50% spent in coordination and counseling of patient including the differential dx, tx, further evaluation and other management of abd pain, anemia, hyponatremia, wt loss, hyperglycemia, HLD

## 2017-08-27 NOTE — Assessment & Plan Note (Signed)
Mild, for f/u lab  

## 2017-09-06 ENCOUNTER — Ambulatory Visit: Payer: PPO | Admitting: Endocrinology

## 2017-09-06 ENCOUNTER — Ambulatory Visit (HOSPITAL_COMMUNITY)
Admission: EM | Admit: 2017-09-06 | Discharge: 2017-09-06 | Disposition: A | Discharge status: Home / Self Care | Payer: PPO | Attending: Family Medicine | Admitting: Family Medicine

## 2017-09-06 ENCOUNTER — Encounter (HOSPITAL_COMMUNITY): Payer: Self-pay | Admitting: Family Medicine

## 2017-09-06 ENCOUNTER — Ambulatory Visit (INDEPENDENT_AMBULATORY_CARE_PROVIDER_SITE_OTHER): Payer: PPO

## 2017-09-06 DIAGNOSIS — K5900 Constipation, unspecified: Secondary | ICD-10-CM | POA: Diagnosis not present

## 2017-09-06 DIAGNOSIS — Z0289 Encounter for other administrative examinations: Secondary | ICD-10-CM

## 2017-09-06 MED ORDER — LACTULOSE 10 GM/15ML PO SOLN: 30.0000 g | Freq: Two times a day (BID) | ORAL | 5 refills | Status: DC | PRN

## 2017-09-06 MED ORDER — DOCUSATE SODIUM 250 MG PO CAPS: 250.0000 mg | ORAL_CAPSULE | Freq: Every day | ORAL | 0 refills | Status: DC
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
## 2017-09-06 NOTE — Discharge Instructions (Addendum)
Please continue lactulose, I have refilled this  Please begin docusate/colace twice daily to begin with, decrease to daily after bowels moving more regular.  Use magnesium citrate as needed- over the counter May also try senna/ex-lax over the counter as needed as well.   Please follow up with gastroenterology for further treatment and management.
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
## 2017-09-06 NOTE — ED Triage Notes (Signed)
Pt here for issues with constipation for over a month. He has been prescribed multiple meds. The lat one was lactulose and he had minimal relief with that. denies any N,V,d.

## 2017-09-07 ENCOUNTER — Ambulatory Visit (INDEPENDENT_AMBULATORY_CARE_PROVIDER_SITE_OTHER): Payer: PPO | Admitting: *Deleted

## 2017-09-07 VITALS — BP 138/70 | HR 90 | Resp 18 | Ht 65.0 in | Wt 170.0 lb

## 2017-09-07 DIAGNOSIS — Z0000 Encounter for general adult medical examination without abnormal findings: Secondary | ICD-10-CM | POA: Diagnosis not present

## 2017-09-07 NOTE — Patient Instructions (Addendum)
Continue doing brain stimulating activities (puzzles, reading, adult coloring books, staying active) to keep memory sharp.   Continue to eat heart healthy diet (full of fruits, vegetables, whole grains, lean protein, water--limit salt, fat, and sugar intake) and increase physical activity as tolerated.   [name] , Thank you for taking time to come for your Medicare Wellness Visit. I appreciate your ongoing commitment to your health goals. Please review the following plan we discussed and let me know if I can assist you in the future.   These are the goals we discussed: Goals    . Patient Stated     Stay as healthy and as independent as possible.        This is a list of the screening recommended for you and due dates:  Health Maintenance  Topic Date Due  . Flu Shot  11/22/2017  . Tetanus Vaccine  05/05/2025  . Pneumonia vaccines  Completed     Constipation, Adult Constipation is when a person:  Poops (has a bowel movement) fewer times in a week than normal.  Has a hard time pooping.  Has poop that is dry, hard, or bigger than normal.  Follow these instructions at home: Eating and drinking   Eat foods that have a lot of fiber, such as: ? Fresh fruits and vegetables. ? Whole grains. ? Beans.  Eat less of foods that are high in fat, low in fiber, or overly processed, such as: ? [place] fries. ? Hamburgers. ? Cookies. ? Candy. ? Soda.  Drink enough fluid to keep your pee (urine) clear or pale yellow. General instructions  Exercise regularly or as told by your doctor.  Go to the restroom when you feel like you need to poop. Do not hold it in.  Take over-the-counter and prescription medicines only as told by your doctor. These include any fiber supplements.  Do pelvic floor retraining exercises, such as: ? Doing deep breathing while relaxing your lower belly (abdomen). ? Relaxing your pelvic floor while pooping.  Watch your condition for any  changes.  Keep all follow-up visits as told by your doctor. This is important. Contact a doctor if:  You have pain that gets worse.  You have a fever.  You have not pooped for 4 days.  You throw up (vomit).  You are not hungry.  You lose weight.  You are bleeding from the anus.  You have thin, pencil-like poop (stool). Get help right away if:  You have a fever, and your symptoms suddenly get worse.  You leak poop or have blood in your poop.  Your belly feels hard or bigger than normal (is bloated).  You have very bad belly pain.  You feel dizzy or you faint. This information is not intended to replace advice given to you by your health care provider. Make sure you discuss any questions you have with your health care provider. Document Released: 09/27/2007 Document Revised: 10/29/2015 Document Reviewed: 09/29/2015 Elsevier Interactive Patient Education  2018 [employer].   Health Maintenance, Male A healthy lifestyle and preventive care is important for your health and wellness. Ask your health care provider about what schedule of regular examinations is right for you. What should I know about weight and diet? Eat a Healthy Diet  Eat plenty of vegetables, fruits, whole grains, low-fat dairy products, and lean protein.  Do not eat a lot of foods high in solid fats, added sugars, or salt.  Maintain a Healthy Weight Regular exercise can help  you achieve or maintain a healthy weight. You should:  Do at least 150 minutes of exercise each week. The exercise should increase your heart rate and make you sweat (moderate-intensity exercise).  Do strength-training exercises at least twice a week.  Watch Your Levels of Cholesterol and Blood Lipids  Have your blood tested for lipids and cholesterol every 5 years starting at 82 years of age. If you are at high risk for heart disease, you should start having your blood tested when you are 82 years old. You may need to have  your cholesterol levels checked more often if: ? Your lipid or cholesterol levels are high. ? You are older than 82 years of age. ? You are at high risk for heart disease.  What should I know about cancer screening? Many types of cancers can be detected early and may often be prevented. Lung Cancer  You should be screened every year for lung cancer if: ? You are a current smoker who has smoked for at least 30 years. ? You are a former smoker who has quit within the past 15 years.  Talk to your health care provider about your screening options, when you should start screening, and how often you should be screened.  Colorectal Cancer  Routine colorectal cancer screening usually begins at 82 years of age and should be repeated every 5-10 years until you are 82 years old. You may need to be screened more often if early forms of precancerous polyps or small growths are found. Your health care provider may recommend screening at an earlier age if you have risk factors for colon cancer.  Your health care provider may recommend using home test kits to check for hidden blood in the stool.  A small camera at the end of a tube can be used to examine your colon (sigmoidoscopy or colonoscopy). This checks for the earliest forms of colorectal cancer.  Prostate and Testicular Cancer  Depending on your age and overall health, your health care provider may do certain tests to screen for prostate and testicular cancer.  Talk to your health care provider about any symptoms or concerns you have about testicular or prostate cancer.  Skin Cancer  Check your skin from head to toe regularly.  Tell your health care provider about any new moles or changes in moles, especially if: ? There is a change in a mole's size, shape, or color. ? You have a mole that is larger than a pencil eraser.  Always use sunscreen. Apply sunscreen liberally and repeat throughout the day.  Protect yourself by wearing long  sleeves, pants, a wide-brimmed hat, and sunglasses when outside.  What should I know about heart disease, diabetes, and high blood pressure?  If you are 24-23 years of age, have your blood pressure checked every 3-5 years. If you are 49 years of age or older, have your blood pressure checked every year. You should have your blood pressure measured twice-once when you are at a hospital or clinic, and once when you are not at a hospital or clinic. Record the average of the two measurements. To check your blood pressure when you are not at a hospital or clinic, you can use: ? An automated blood pressure machine at a pharmacy. ? A home blood pressure monitor.  Talk to your health care provider about your target blood pressure.  If you are between 17-64 years old, ask your health care provider if you should take aspirin to prevent heart disease.  Have regular diabetes screenings by checking your fasting blood sugar level. ? If you are at a normal weight and have a low risk for diabetes, have this test once every three years after the age of 19. ? If you are overweight and have a high risk for diabetes, consider being tested at a younger age or more often.  A one-time screening for abdominal aortic aneurysm (AAA) by ultrasound is recommended for men aged 65-75 years who are current or former smokers. What should I know about preventing infection? Hepatitis B If you have a higher risk for hepatitis B, you should be screened for this virus. Talk with your health care provider to find out if you are at risk for hepatitis B infection. Hepatitis C Blood testing is recommended for:  Everyone born from [birth year] through 1963.  Anyone with known risk factors for hepatitis C.  Sexually Transmitted Diseases (STDs)  You should be screened each year for STDs including gonorrhea and chlamydia if: ? You are sexually active and are younger than 82 years of age. ? You are older than 82 years of age and your  health care provider tells you that you are at risk for this type of infection. ? Your sexual activity has changed since you were last screened and you are at an increased risk for chlamydia or gonorrhea. Ask your health care provider if you are at risk.  Talk with your health care provider about whether you are at high risk of being infected with HIV. Your health care provider may recommend a prescription medicine to help prevent HIV infection.  What else can I do?  Schedule regular health, dental, and eye exams.  Stay current with your vaccines (immunizations).  Do not use any tobacco products, such as cigarettes, chewing tobacco, and e-cigarettes. If you need help quitting, ask your health care provider.  Limit alcohol intake to no more than 2 drinks per day. One drink equals 12 ounces of beer, 5 ounces of wine, or 1 ounces of hard liquor.  Do not use street drugs.  Do not share needles.  Ask your health care provider for help if you need support or information about quitting drugs.  Tell your health care provider if you often feel depressed.  Tell your health care provider if you have ever been abused or do not feel safe at home. This information is not intended to replace advice given to you by your health care provider. Make sure you discuss any questions you have with your health care provider. Document Released: 10/07/2007 Document Revised: 12/08/2015 Document Reviewed: 01/12/2015 Elsevier Interactive Patient Education  [employer].

## 2017-09-10 ENCOUNTER — Inpatient Hospital Stay: Admission: RE | Admit: 2017-09-10 | Payer: PPO | Source: Ambulatory Visit

## 2017-10-18 ENCOUNTER — Encounter: Payer: Self-pay | Admitting: Internal Medicine

## 2017-10-18 ENCOUNTER — Ambulatory Visit: Payer: PPO | Admitting: Internal Medicine

## 2017-10-18 VITALS — BP 128/80 | HR 82 | Ht 65.0 in | Wt 166.0 lb

## 2017-10-18 DIAGNOSIS — J449 Chronic obstructive pulmonary disease, unspecified: Secondary | ICD-10-CM | POA: Diagnosis not present

## 2017-10-18 MED ORDER — ALBUTEROL SULFATE HFA 108 (90 BASE) MCG/ACT IN AERS: INHALATION_SPRAY | RESPIRATORY_TRACT | 1 refills | Status: DC

## 2017-10-18 MED ORDER — BUDESONIDE-FORMOTEROL FUMARATE 80-4.5 MCG/ACT IN AERO: INHALATION_SPRAY | RESPIRATORY_TRACT | 11 refills | Status: DC

## 2017-10-18 NOTE — Progress Notes (Signed)
Subjective:    Patient ID: Alexander [name], male    DOB: May 16, 1932, 84 y.o.   MRN: [id number]     Brief patient profile:  84  yobm quit smoking 04/2000    gold III COPD & dyspnea   01/09/13  Cardiac evaluation - echo nml  Stress test - peak VO2 68%, limited by ventilation  c. cath [name]) -11/10 - nml LV fn, nml coronaries  Spirometry >>FEV1 1.14 - 45% -moderate airway obstruction       05/04/2017 acute extended ov/Nalin Mazzocco re:  COPD III  Really not clear what meds he's been taking nor is his wife sure Chief Complaint  Patient presents with  . Acute Visit    productive cough with clear,gray sputum,SOB w/ activity,feels it has improved with the inhaler he uses  daily cough x 3 months, worse in am but all day long and doe = MMRC3 = can't walk 100 yards even at a slow pace at a flat grade s stopping due to sob  - still shops at [facility] but can't get around without resting  Assoc rhinitis/ nasal congestion  Very confused again with meds  rec Symbicort 80  Take 2 puffs first thing in am and then another 2 puffs about 12 hours later.  Pantoprazole (protonix) 40 mg   Take  30-60 min before first meal of the day and Pepcid (famotidine)  20 mg one @  bedtime until return to office Prednisone 10 mg take  4 each am x 2 days,   2 each am x 2 days,  1 each am x 2 days and stop   Augmentin 875 mg take one pill twice daily  X 10 days - take at breakfast and supper with large glass of water.  GERD diet  Work on inhaler technique   Please schedule a follow up office visit in 2 weeks, sooner if needed with all medications / inhalers / solutions    05/21/2017  f/u ov/Manju Kulkarni re:  COPD GOLD III / main problem is coughing > sob  Chief Complaint  Patient presents with  . Follow-up    Cough is about the same. He has not been looking at what color sputum he is producing. He still has abx left over, he has only been taking 1 tablet daily.   Cough was gone to his satisfaction until 3 days prior to OV  Then  acute worse/ min productive/ still on augmentin  Wife has same "cold"   pt brought symbicort 80 sample well past the red line/ again confused with meds Dspnea:  No change = MMRC3 = can't walk 100 yards even at a slow pace at a flat grade s stopping due to sob  / not using neb saba at all or following action plan  Sleep: disrupted by coughing fits now and has to sleep propped up  rec Plan A = Automatic = symbicort 80 Take 2 puffs first thing in am and then another 2 puffs about 12 hours later.  Work on inhaler technique:   Plan B = Backup Only use your albuterol as a rescue medicatio Best cough medicine > delsym 2 tsp every 12 hours and if still coughing supplement with Tylenol #3 Finish your antibiotics Please see patient coordinator before you leave today  to schedule sinus CT  Please remember to go to the lab department downstairs in the basement  for your tests - we will call you with the results when they are available.  Please schedule a follow up office visit in 2 weeks, sooner if needed  with all medications /inhalers/ solutions in hand so we can verify exactly what you are taking. This includes all medications from all doctors and over the counters   06/04/2017  f/u ov/Okley Magnussen re:  Ab/ sinusitis  Chief Complaint  Patient presents with  . Follow-up    Increased cough with grey sputum since the last visit.   using saba neb several times a day  Dyspnea:  MMRC2 = can't walk a nl pace on a flat grade s sob but does fine slow and flat  Cough: esp after supper variably productive min slt dark mucus Sleep: ok flat   Main complaints are related to nasal congestion   rec No change in medications for now Keep appt to see Dr Annalee GentaShoemaker > rec FESS  Please schedule a follow up office visit in 6 weeks, call sooner if needed with all medications /inhalers/ solutions in hand so we can verify exactly what you are taking. This includes all medications from all doctors and over the counters  - pfts  on return       07/17/2017  f/u ov/Shanicqua Coldren re:  COPD III/ did not bring meds as req but using symb prn rather than 2 bid  Chief Complaint  Patient presents with  . Follow-up    PFT's done today. He is coughing less, but still producing some green to grey colored sputum.     Dyspnea:   MMRC2 = can't walk a nl pace on a flat grade s sob but does fine slow and flat  Cough: better p abx with gen ant bilateral cp provoked by cough resolved also  Sleep: ok now  SABA use:  Has neb, not needing rec Change symbicort to 160 Take 2 puffs first thing in am and then another 2 puffs about 12 hours later.  If no difference between the 160 and the 80 strength ok to resume the 80  Work on inhaler technique F/u with Annalee GentaShoemaker      10/18/2017  f/u ov/Molina Hollenback re:  Copd GOLD III Chief Complaint  Patient presents with  . Follow-up    Breathing has improved some. He uses his neb every am.     Dyspnea:  MMRC2 = can't walk a nl pace on a flat grade s sob but does fine slow and flat   Cough: not a problem  SABA use: confused, doesn't give straight answers    No obvious day to day or daytime variability or assoc excess/ purulent sputum or mucus plugs or hemoptysis or cp or chest tightness, subjective wheeze or overt sinus or hb symptoms.   Sleeping: ok flat without nocturnal  or early am exacerbation  of respiratory  c/o's or need for noct saba. Also denies any obvious fluctuation of symptoms with weather or environmental changes or other aggravating or alleviating factors except as outlined above   No unusual exposure hx or h/o childhood pna/ asthma or knowledge of premature birth.  Current Allergies, Complete Past Medical History, Past Surgical History, Family History, and Social History were reviewed in [facility] electronic medical record.  ROS  The following are not active complaints unless bolded Hoarseness, sore throat, dysphagia, dental problems, itching, sneezing,  nasal congestion or  discharge of excess mucus or purulent secretions, ear ache,   fever, chills, sweats, unintended wt loss or wt gain, classically pleuritic or exertional cp,  orthopnea pnd or arm/hand swelling  or leg swelling, presyncope,  palpitations, abdominal pain, anorexia, nausea, vomiting, diarrhea  or change in bowel habits or change in bladder habits, change in stools or change in urine, dysuria, hematuria,  rash, arthralgias, visual complaints, headache, numbness, weakness or ataxia or problems with walking or coordination,  change in mood or  memory.        Current Meds  Medication Sig  . albuterol (PROVENTIL) (2.5 MG/3ML) 0.083% nebulizer solution Take 2.5 mg by nebulization every 6 (six) hours as needed for wheezing or shortness of breath.  . carboxymethylcellulose (REFRESH TEARS) 0.5 % SOLN Place 1-2 drops into both eyes 3 (three) times daily as needed (eye irritation.).  Marland Kitchen famotidine (PEPCID) 20 MG tablet One at bedtime (Patient taking differently: Take 20 mg by mouth at bedtime. )  . lactulose (CHRONULAC) 10 GM/15ML solution Take 45 mLs (30 g total) by mouth 2 (two) times daily as needed for mild constipation.  . pantoprazole (PROTONIX) 40 MG tablet Take 1 tablet (40 mg total) by mouth daily as needed (for heartburn). (Patient taking differently: Take 40 mg by mouth daily before breakfast. )  . rivastigmine (EXELON) 1.5 MG capsule Take 1 capsule (1.5 mg total) by mouth 2 (two) times daily.  .   budesonide-formoterol (SYMBICORT) 160-4.5 MCG/ACT inhaler Inhale 2 puffs into the lungs 2 (two) times daily. (Patient taking differently: Inhale 2 puffs into the lungs 2 (two) times daily as needed (for respiratory issues.). )                                         Objective:   Physical Exam  amb bm nad      10/18/2017      166 07/17/2017       173   06/04/2017      176  05/21/2017       175  05/04/2017       173   03/17/2016    182   02/03/14 177 lb (80.287 kg)  01/27/14 177  lb 12 oz (80.627 kg)  01/06/14 173 lb 8 oz (78.699 kg)    Vital signs reviewed - Note on arrival 02 sats  95% on RA         HEENT: nl  oropharynx. Nl external ear canals without cough reflex - mild bilateral non-specific turbinate edema/ full dentures      NECK :  without JVD/Nodes/TM/ nl carotid upstrokes bilaterally   LUNGS: no acc muscle use,  Mod barrel  contour chest wall with bilateral  Distant bs s audible wheeze and  without cough on insp or exp maneuver and mod   Hyperresonant  to  percussion bilaterally     CV:  RRR  no s3 or murmur or increase in P2, and no edema   ABD:  soft and nontender with pos mid insp Hoover's  in the supine position. No bruits or organomegaly appreciated, bowel sounds nl  MS:   Nl gait/  ext warm without deformities, calf tenderness, cyanosis or clubbing No obvious joint restrictions   SKIN: warm and dry without lesions    NEURO:  alert, approp, nl sensorium with  no motor or cerebellar deficits apparent.                         Assessment & Plan:

## 2017-10-18 NOTE — Patient Instructions (Signed)
Plan A = Automatic = Symbicort 80 Take 2 puffs first thing in am and then another 2 puffs about 12 hours later.    Work on inhaler technique:  relax and gently blow all the way out then take a nice smooth deep breath back in, triggering the inhaler at same time you start breathing in.  Hold for up to 5 seconds if you can. Blow out thru nose. Rinse and gargle with water when done    Plan B = Backup Only use your albuterol as a rescue medication to be used if you can't catch your breath by resting or doing a relaxed purse lip breathing pattern.  - The less you use it, the better it will work when you need it. - Ok to use the inhaler up to 2 puffs  every 4 hours if you must but call for appointment if use goes up over your usual need - Don't leave home without it !!  (think of it like the spare tire for your car)   Plan C = Crisis - only use your albuterol nebulizer if you first try Plan B and it fails to help > ok to use the nebulizer up to every 4 hours but if start needing it regularly call for immediate appointment      If you are satisfied with your treatment plan,  let your doctor know and he/she can either refill your medications or you can return here when your prescription runs out.     If in any way you are not 100% satisfied,  please tell us.  If 100% better, tell your friends!  Pulmonary follow up is as needed   BUT YOU MUST BRING ALL ACTIVE MEDICATIONS

## 2017-10-19 ENCOUNTER — Encounter: Payer: Self-pay | Admitting: Internal Medicine

## 2017-10-19 NOTE — Assessment & Plan Note (Addendum)
Spirometry 09/27/12  FEV1  1.06 (44%) ratio 47  - Spirometry 05/04/2017  FEV1 0.32 (16%)  Ratio 81 but f/v not physiologic   - 05/04/2017    > try symb 80 2bid as cough is the primary concern  - 05/21/2017  After extensive coaching inhaler device  effectiveness =    75% from a baseline of 25% - PFT's  07/17/2017  FEV1 1.17 (64 % ) ratio 64  p 20 % improvement from saba p nothing prior to study with DLCO  54 % corrects to 72  % for alv volume     - 10/18/2017  After extensive coaching inhaler device  effectiveness =    75% (early trigger)  Despite consistent inconsistency with using meds correctly he's doing relatively well as long as stays on symbicort 160 2bid typical of AB phenotype and would keep things as simple as possible for him going forward to avoid confusion / complicate the medication reconciliation process  Pulmonary f/u can be prn  But pt must return with all meds in hand using a trust but verify approach to confirm accurate Medication  Reconciliation The principal here is that until we are certain that the  patients are doing what we've asked, it makes no sense to ask them to do more.    I had an extended discussion with the patient reviewing all relevant studies completed to date and  lasting 15 to 20 minutes of a 25 minute visit    See device teaching which extended face to face time for this visit.  Each maintenance medication was reviewed in detail including emphasizing most importantly the difference between maintenance and prns and under what circumstances the prns are to be triggered using an action plan format that is not reflected in the computer generated alphabetically organized AVS which I have not found useful in most complex patients, especially with respiratory illnesses  Please see AVS for specific instructions unique to this visit that I personally wrote and verbalized to the the pt in detail and then reviewed with pt  by my nurse highlighting any  changes in therapy  recommended at today's visit to their plan of care.

## 2017-11-10 ENCOUNTER — Ambulatory Visit (INDEPENDENT_AMBULATORY_CARE_PROVIDER_SITE_OTHER): Payer: PPO

## 2017-11-10 ENCOUNTER — Ambulatory Visit (HOSPITAL_COMMUNITY)
Admission: EM | Admit: 2017-11-10 | Discharge: 2017-11-10 | Disposition: A | Discharge status: Home / Self Care | Payer: PPO | Attending: Family Medicine | Admitting: Family Medicine

## 2017-11-10 ENCOUNTER — Encounter (HOSPITAL_COMMUNITY): Payer: Self-pay | Admitting: *Deleted

## 2017-11-10 DIAGNOSIS — K5909 Other constipation: Secondary | ICD-10-CM

## 2017-11-10 DIAGNOSIS — K5900 Constipation, unspecified: Secondary | ICD-10-CM | POA: Diagnosis not present

## 2017-11-10 LAB — POCT I-STAT, CHEM 8
BUN: 9 mg/dL (ref 8–23)
CALCIUM ION: 1.24 mmol/L (ref 1.15–1.40)
Chloride: 104 mmol/L (ref 98–111)
Creatinine, Ser: 1 mg/dL (ref 0.61–1.24)
GLUCOSE: 108 mg/dL — AB (ref 70–99)
HCT: 42 % (ref 39.0–52.0)
HEMOGLOBIN: 14.3 g/dL (ref 13.0–17.0)
Potassium: 3.9 mmol/L (ref 3.5–5.1)
SODIUM: 143 mmol/L (ref 135–145)
TCO2: 28 mmol/L (ref 22–32)

## 2017-11-10 LAB — OCCULT BLOOD, POC DEVICE: FECAL OCCULT BLD: NEGATIVE

## 2017-11-10 MED ORDER — BISACODYL 5 MG PO TBEC: 5.0000 mg | DELAYED_RELEASE_TABLET | Freq: Two times a day (BID) | ORAL | 0 refills | Status: DC

## 2017-11-10 NOTE — ED Triage Notes (Signed)
C/O constipation x 4 days with bloating.  Denies nausea/vomiting, or pain.  Reports "a lot of bleeding this morning".  Has been taking lactulose Rx x 1 month "but it's not really working".

## 2017-11-10 NOTE — Discharge Instructions (Signed)
Drink plenty of water, at least half your body weight in ounces Incorporate fiber rich foods in your diet, and avoid foods that may cause constipation like milk and dairy products, fried/fast foods, red meat Dulcolax prescribed.  Take as directed Follow up with PCP or GI specialist this week for reevaluation and management of chronic constipation Go to the ED if you have any new or worsening symptoms or do not have a bowel movement in the next day or two with treatment
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
## 2017-11-19 ENCOUNTER — Telehealth: Payer: Self-pay | Admitting: Internal Medicine

## 2017-11-19 DIAGNOSIS — J449 Chronic obstructive pulmonary disease, unspecified: Secondary | ICD-10-CM

## 2017-11-19 NOTE — Telephone Encounter (Signed)
ATC x 2 line was busy 

## 2017-11-19 NOTE — Telephone Encounter (Signed)
LMTCB for patient or his wife.

## 2017-11-20 MED ORDER — BUDESONIDE-FORMOTEROL FUMARATE 80-4.5 MCG/ACT IN AERO: INHALATION_SPRAY | RESPIRATORY_TRACT | 11 refills | Status: DC

## 2017-11-20 NOTE — Telephone Encounter (Signed)
Patient wife is returning the call. CB is [phone].

## 2017-11-20 NOTE — Telephone Encounter (Signed)
Called and spoke with [name], Alexander [name]. She stated that the Patient needed a refill for his Symbicort 80.  She was unsure about emergency inhaler.  She stated that she would call back if he needed the emergency inhaler, Proair is listed on Patient's med list.  Symbicort prescription was printed 10/18/17, by MW.  She was unsure of printed prescription.  New prescription for Symbicort 80 sent to [facility] [address].  Nothing further at this time.

## 2017-11-27 ENCOUNTER — Encounter: Payer: Self-pay | Admitting: Internal Medicine

## 2017-11-27 ENCOUNTER — Ambulatory Visit (INDEPENDENT_AMBULATORY_CARE_PROVIDER_SITE_OTHER): Payer: PPO | Admitting: Internal Medicine

## 2017-11-27 VITALS — BP 116/72 | HR 57 | Temp 98.1°F | Ht 65.0 in | Wt 168.0 lb

## 2017-11-27 DIAGNOSIS — Z0000 Encounter for general adult medical examination without abnormal findings: Secondary | ICD-10-CM

## 2017-11-27 NOTE — Patient Instructions (Signed)
Please continue all other medications as before, and refills have been done if requested.  Please have the pharmacy call with any other refills you may need.  Please continue your efforts at being more active, low cholesterol diet, and weight control.  You are otherwise up to date with prevention measures today.  Please keep your appointments with your specialists as you may have planned  Please return in 6 months, or sooner if needed 
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
## 2017-11-27 NOTE — Assessment & Plan Note (Signed)

## 2017-12-14 ENCOUNTER — Telehealth: Payer: Self-pay | Admitting: Internal Medicine

## 2017-12-14 MED ORDER — ALBUTEROL SULFATE (2.5 MG/3ML) 0.083% IN NEBU: 2.5000 mg | INHALATION_SOLUTION | Freq: Four times a day (QID) | RESPIRATORY_TRACT | 0 refills | Status: DC | PRN

## 2017-12-14 NOTE — Telephone Encounter (Signed)
Copied from CRM [id number]. Topic: Quick Communication - Rx Refill/Question >> Dec 14, 2017 11:52 AM Marylen PontoMcneil, Ja-Kwan wrote: Medication: albuterol (PROVENTIL) (2.5 MG/3ML) 0.083% nebulizer solution  Has the patient contacted their pharmacy? yes   Preferred Pharmacy (with phone number or street name): [id number] [place], [place] - [address] [phone] (Phone) [phone] (Fax)  Agent: Please be advised that RX refills may take up to 3 business days. We ask that you follow-up with your pharmacy.

## 2018-01-04 ENCOUNTER — Ambulatory Visit (INDEPENDENT_AMBULATORY_CARE_PROVIDER_SITE_OTHER)
Admission: RE | Admit: 2018-01-04 | Discharge: 2018-01-04 | Disposition: A | Discharge status: Home / Self Care | Payer: PPO | Source: Ambulatory Visit | Attending: Internal Medicine | Admitting: Internal Medicine

## 2018-01-04 ENCOUNTER — Other Ambulatory Visit (INDEPENDENT_AMBULATORY_CARE_PROVIDER_SITE_OTHER): Payer: PPO

## 2018-01-04 ENCOUNTER — Encounter: Payer: Self-pay | Admitting: Internal Medicine

## 2018-01-04 ENCOUNTER — Ambulatory Visit (INDEPENDENT_AMBULATORY_CARE_PROVIDER_SITE_OTHER): Payer: PPO | Admitting: Internal Medicine

## 2018-01-04 VITALS — BP 138/72 | HR 102 | Ht 65.0 in | Wt 166.8 lb

## 2018-01-04 DIAGNOSIS — R0602 Shortness of breath: Secondary | ICD-10-CM | POA: Diagnosis not present

## 2018-01-04 DIAGNOSIS — R0609 Other forms of dyspnea: Secondary | ICD-10-CM | POA: Diagnosis not present

## 2018-01-04 DIAGNOSIS — R05 Cough: Secondary | ICD-10-CM

## 2018-01-04 DIAGNOSIS — J449 Chronic obstructive pulmonary disease, unspecified: Secondary | ICD-10-CM

## 2018-01-04 DIAGNOSIS — R1084 Generalized abdominal pain: Secondary | ICD-10-CM | POA: Diagnosis not present

## 2018-01-04 DIAGNOSIS — R058 Other specified cough: Secondary | ICD-10-CM

## 2018-01-04 LAB — CBC WITH DIFFERENTIAL/PLATELET
BASOS PCT: 1 % (ref 0.0–3.0)
Basophils Absolute: 0 10*3/uL (ref 0.0–0.1)
EOS ABS: 0.3 10*3/uL (ref 0.0–0.7)
Eosinophils Relative: 7.9 % — ABNORMAL HIGH (ref 0.0–5.0)
HEMATOCRIT: 37.9 % — AB (ref 39.0–52.0)
HEMOGLOBIN: 12.6 g/dL — AB (ref 13.0–17.0)
LYMPHS PCT: 21.1 % (ref 12.0–46.0)
Lymphs Abs: 0.8 10*3/uL (ref 0.7–4.0)
MCHC: 33.2 g/dL (ref 30.0–36.0)
MCV: 93.8 fl (ref 78.0–100.0)
MONOS PCT: 10.3 % (ref 3.0–12.0)
Monocytes Absolute: 0.4 10*3/uL (ref 0.1–1.0)
Neutro Abs: 2.4 10*3/uL (ref 1.4–7.7)
Neutrophils Relative %: 59.7 % (ref 43.0–77.0)
Platelets: 283 10*3/uL (ref 150.0–400.0)
RBC: 4.04 Mil/uL — ABNORMAL LOW (ref 4.22–5.81)
RDW: 12.8 % (ref 11.5–15.5)
WBC: 4 10*3/uL (ref 4.0–10.5)

## 2018-01-04 LAB — BRAIN NATRIURETIC PEPTIDE: Pro B Natriuretic peptide (BNP): 73 pg/mL (ref 0.0–100.0)

## 2018-01-04 LAB — BASIC METABOLIC PANEL
BUN: 10 mg/dL (ref 6–23)
CALCIUM: 9.3 mg/dL (ref 8.4–10.5)
CO2: 29 meq/L (ref 19–32)
Chloride: 106 mEq/L (ref 96–112)
Creatinine, Ser: 1.12 mg/dL (ref 0.40–1.50)
GFR: 80.56 mL/min (ref >60.00)
GLUCOSE: 99 mg/dL (ref 70–99)
POTASSIUM: 4.3 meq/L (ref 3.5–5.1)
SODIUM: 144 meq/L (ref 135–145)

## 2018-01-04 LAB — TSH: TSH: 1.2 u[IU]/mL (ref 0.35–4.50)

## 2018-01-04 MED ORDER — ALBUTEROL SULFATE HFA 108 (90 BASE) MCG/ACT IN AERS: INHALATION_SPRAY | RESPIRATORY_TRACT | 1 refills | Status: DC

## 2018-01-04 NOTE — Patient Instructions (Addendum)
Continue pantoprazole 40 mg Take 30-60 min before first meal of the day / famotidine 40 mg after supper   GERD (REFLUX)  is an extremely common cause of respiratory symptoms just like yours , many times with no obvious heartburn at all.    It can be treated with medication, but also with lifestyle changes including elevation of the head of your bed (ideally with 6 inch  bed blocks),  Smoking cessation, avoidance of late meals, excessive alcohol, and avoid fatty foods, chocolate, peppermint, colas, red wine, and acidic juices such as orange juice.  NO MINT OR MENTHOL PRODUCTS SO NO COUGH DROPS   USE SUGARLESS CANDY INSTEAD (Jolley ranchers or Stover's or Life Savers) or even ice chips will also do - the key is to swallow to prevent all throat clearing. NO OIL BASED VITAMINS - use powdered substitutes.  Plan A = Automatic = Symbicort 80 Take 2 puffs first thing in am and then another 2 puffs about 12 hours later.  Work on inhaler technique:  relax and gently blow all the way out then take a nice smooth deep breath back in, triggering the inhaler at same time you start breathing in.  Hold for up to 5 seconds if you can. Blow out thru nose. Rinse and gargle with water when done  Plan B = Backup Only use your albuterol as a rescue medication to be used if you can't catch your breath by resting or doing a relaxed purse lip breathing pattern.  - The less you use it, the better it will work when you need it. - Ok to use the inhaler up to 2 puffs  every 4 hours if you must but call for appointment if use goes up over your usual need - Don't leave home without it !!  (think of it like the spare tire for your car)    Plan C = Crisis - only use your albuterol nebulizer if you first try Plan B and it fails to help > ok to use the nebulizer up to every 4 hours but if start needing it regularly call for immediate appointment   For cough >>> Take delsym two tsp every 12 hours to suppress the urge to cough.  Swallowing water and/or using ice chips/non mint and menthol containing candies (such as lifesavers or sugarless jolly ranchers) are also effective.  You should rest your voice and avoid activities that you know make you cough.   For constipation:  Double the dose of lactulose you are taking until stools turn soft or you get diarrhea then back off to previous dose  Please remember to go to the lab and x-ray department downstairs in the basement  for your tests - we will call you with the results when they are available.     Please schedule a follow up office visit in 4 weeks, sooner if needed  with all medications /inhalers/ solutions in hand so we can verify exactly what you are taking. This includes all medications from all doctors and over the counters

## 2018-01-04 NOTE — Progress Notes (Signed)
Subjective:    Patient ID: Alexander [name], male    DOB: 1933-02-04    MRN: [id number]    Brief patient profile:  84  yobm quit smoking 04/2000    gold III COPD & dyspnea    01/09/13  Cardiac evaluation - echo nml  Stress test - peak VO2 68%, limited by ventilation  c. cath [name]) -11/10 - nml LV fn, nml coronaries  Spirometry >>FEV1 1.14 - 45% -moderate airway obstruction       05/04/2017 acute extended ov/Alexander [name] re:  COPD III  Really not clear what meds he's been taking nor is his wife sure Chief Complaint  Patient presents with  . 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  Pantoprazole (protonix) 40 mg   Take  30-60 min before first meal of the day and Pepcid (famotidine)  20 mg one @  bedtime until return to office Prednisone 10 mg take  4 each am x 2 days,   2 each am x 2 days,  1 each am x 2 days and stop   Augmentin 875 mg take one pill twice daily  X 10 days - take at breakfast and supper with large glass of water.  GERD diet  Work on inhaler technique   Please schedule a follow up office visit in 2 weeks, sooner if needed with all medications / inhalers / solutions    05/21/2017  f/u ov/Alexander [name] re:  COPD GOLD III / main problem is coughing > sob  Chief Complaint  Patient presents with  . Follow-up    Cough is about the same. He has not been looking at what color sputum he is producing. He still has abx left over, he has only been taking 1 tablet daily.   Cough was gone to his satisfaction until 3 days prior to OV  Then acute worse/  min productive/ still on augmentin  Wife has same "cold"   pt brought symbicort 80 sample well past the red line/ again confused with meds Dspnea:  No change = MMRC3 = can't walk 100 yards even at a slow pace at a flat grade s stopping due to sob  / not using neb saba at all or following action plan  Sleep: disrupted by coughing fits now and has to sleep propped up  rec Plan A = Automatic = symbicort 80 Take 2 puffs first thing in am and then another 2 puffs about 12 hours later.  Work on inhaler technique:   Plan B = Backup Only use your albuterol as a rescue medicatio Best cough medicine > delsym 2 tsp every 12 hours and if still coughing supplement with Tylenol #3 Finish your antibiotics Please see patient coordinator before you leave today  to schedule sinus CT  Please remember to go to the lab department downstairs in the basement  for your tests - we will call you with the results when they are available.  Please schedule a follow up office visit in 2 weeks, sooner if needed  with all medications /inhalers/ solutions in hand so we can verify exactly what you are taking. This includes all medications from all doctors and over the counters   06/04/2017  f/u ov/Alexander [name] re:  Ab/ sinusitis  Chief Complaint  Patient presents with  . Follow-up    Increased cough with grey sputum since the last visit.   using saba neb several times a day  Dyspnea:  MMRC2 = can't walk a nl pace on a flat grade s sob but does fine slow and flat  Cough: esp after supper variably productive min slt dark mucus Sleep: ok flat   Main complaints are related to nasal congestion   rec No change in medications for now Keep appt to see Dr Annalee Genta > rec FESS  Please schedule a follow up office visit in 6 weeks, call sooner if needed with all medications /inhalers/ solutions in hand so we can verify exactly what you are taking. This includes all medications from all doctors and over the counters  - pfts on return        07/17/2017  f/u ov/Alexander [name] re:  COPD III/ did not bring meds as req but using symb prn rather than 2 bid  Chief Complaint  Patient presents with  . Follow-up    PFT's done today. He is coughing less, but still producing some green to grey colored sputum.     Dyspnea:   MMRC2 = can't walk a nl pace on a flat grade s sob but does fine slow and flat  Cough: better p abx with gen ant bilateral cp provoked by cough resolved also  Sleep: ok now  SABA use:  Has neb, not needing rec Change symbicort to 160 Take 2 puffs first thing in am and then another 2 puffs about 12 hours later.  If no difference between the 160 and the 80 strength ok to resume the 80  Work on inhaler technique F/u with Annalee Genta      10/18/2017  f/u ov/Alexander [name] re:  Copd GOLD III Chief Complaint  Patient presents with  . Follow-up    Breathing has improved some. He uses his neb every am.     Dyspnea:  MMRC2 = can't walk a nl pace on a flat grade s sob but does fine slow and flat   Cough: not a problem  SABA use: confused, doesn't give straight answers  rec Plan A = Automatic = Symbicort 80 Take 2 puffs first thing in am and then another 2 puffs about 12 hours later.  Work on inhaler technique:  relax and gently blow all the way out then take a nice smooth deep breath back in, triggering the inhaler at same time you start breathing in.  Hold for up to 5 seconds if you can. Blow out thru nose. Rinse and gargle with water when done Plan B = Backup Only use your albuterol as a rescue medication  Plan C = Crisis - only use your albuterol nebulizer if you first try Plan B   BUT YOU MUST BRING ALL ACTIVE MEDICATIONS     01/04/2018  Acute ov/Alexander [name] re: cough/ sob worse/ brought all meds/inhalers but doesn't have proair any more  Chief Complaint  Patient presents with  . Acute Visit    Pt c/o increased SOB x 2 wks. He is using his albuterol neb daily and states it makes him "sick". He has not been using  his albuterol  inhaler. He has been coughing up yellow sputum for the past month.  doe = MMRC3 = can't walk 100 yards even at a slow pace at a flat grade s stopping due to sob   Cough severe and  worse after supper but not really productive of excess/ purulent sputum or mucus plugs   Sleeping flat on 2 pillows ok   No obvious day to day or daytime variability or assoc   hemoptysis or cp or chest tightness, subjective wheeze or overt sinus or hb symptoms.   Sleeping as above  without nocturnal  or early am exacerbation  of respiratory  c/o's or need for noct saba. Also denies any obvious fluctuation of symptoms with weather or environmental changes or other aggravating or alleviating factors except as outlined above   No unusual exposure hx or h/o childhood pna/ asthma or knowledge of premature birth.  Current Allergies, Complete Past Medical History, Past Surgical History, Family History, and Social History were reviewed in [facility] electronic medical record.  ROS  The following are not active complaints unless bolded Hoarseness, sore throat, dysphagia, dental problems, itching, sneezing,  nasal congestion or discharge of excess mucus or purulent secretions, ear ache,   fever, chills, sweats, unintended wt loss or wt gain, classically pleuritic or exertional cp,  orthopnea pnd or arm/hand swelling  or leg swelling, presyncope, palpitations, abdominal pain, anorexia, nausea, vomiting, diarrhea  or change in bowel= constipation on lactulose or change in bladder habits, change in stools or change in urine, dysuria, hematuria,  rash, arthralgias, visual complaints, headache, numbness, weakness or ataxia or problems with walking or coordination,  change in mood or  memory.        Current Meds  Medication Sig  . albuterol (PROAIR HFA) 108 (90 Base) MCG/ACT inhaler 2 puffs every 4 hours as needed only  if your can't catch your breath  . albuterol (PROVENTIL) (2.5 MG/3ML) 0.083% nebulizer solution Take 3 mLs  (2.5 mg total) by nebulization every 6 (six) hours as needed for wheezing or shortness of breath.  . bisacodyl (DULCOLAX) 5 MG EC tablet Take 1 tablet (5 mg total) by mouth 2 (two) times daily.  . budesonide-formoterol (SYMBICORT) 80-4.5 MCG/ACT inhaler Take 2 puffs first thing in am and then another 2 puffs about 12 hours later.  . docusate sodium (COLACE) 250 MG capsule Take 250 mg by mouth daily.  . famotidine (PEPCID) 20 MG tablet One at bedtime (Patient taking differently: Take 20 mg by mouth at bedtime. )  . hydrochlorothiazide (MICROZIDE) 12.5 MG capsule Take 12.5 mg by mouth daily.  Marland Kitchen. lactulose (CHRONULAC) 10 GM/15ML solution Take 45 mLs (30 g total) by mouth 2 (two) times daily as needed for mild constipation.  . pantoprazole (PROTONIX) 40 MG tablet Take 1 tablet (40 mg total) by mouth daily as needed (for heartburn). (Patient taking differently: Take 40 mg by mouth daily before breakfast. )  . rivastigmine (EXELON) 1.5 MG capsule Take 1 capsule (1.5 mg total) by mouth 2 (two) times daily.  . [DISCONTINUED] albuterol (PROAIR HFA) 108 (90 Base) MCG/ACT inhaler 2 puffs every 4 hours as needed only  if your can't catch your breath                Objective:   Physical Exam  amb bm dry hacking  cough   01/04/2018      166  10/18/2017      166 07/17/2017       173  06/04/2017      176  05/21/2017       175  05/04/2017       173   03/17/2016    182   02/03/14 177 lb (80.287 kg)  01/27/14 177 lb 12 oz (80.627 kg)  01/06/14 173 lb 8 oz (78.699 kg)    Vital signs reviewed - Note on arrival 02 sats  92% on RA      HEENT: nl dentition,  and oropharynx. Nl external ear canals without cough reflex - moderate bilateral non-specific turbinate edema/ full dentures      NECK :  without JVD/Nodes/TM/ nl carotid upstrokes bilaterally   LUNGS: no acc muscle use,  Nl contour chest which is clear to A and P bilaterally without cough on insp or exp maneuvers   CV:  RRR  no s3 or murmur or  increase in P2, and no edema   ABD:  Mod distended but  nontender with nl inspiratory excursion in the supine position. No bruits or organomegaly appreciated, bowel sounds nl  MS:  Nl gait/ ext warm without deformities, calf tenderness, cyanosis or clubbing No obvious joint restrictions   SKIN: warm and dry without lesions    NEURO:  alert, approp, nl sensorium with  no motor or cerebellar deficits apparent.        CXR PA and Lateral:   01/04/2018 :    I personally reviewed images and agree with radiology impression as follows:    No active cardiopulmonary disease.   Labs ordered/ reviewed:      Chemistry      Component Value Date/Time   NA 144 01/04/2018 1115   K 4.3 01/04/2018 1115   CL 106 01/04/2018 1115   CO2 29 01/04/2018 1115   BUN 10 01/04/2018 1115   CREATININE 1.12 01/04/2018 1115   CREATININE 0.99 07/29/2014 0814      Component Value Date/Time   CALCIUM 9.3 01/04/2018 1115   ALKPHOS 63 08/27/2017 1153   AST 18 08/27/2017 1153   ALT 10 08/27/2017 1153   BILITOT 0.4 08/27/2017 1153        Lab Results  Component Value Date   WBC 4.0 01/04/2018   HGB 12.6 (L) 01/04/2018   HCT 37.9 (L) 01/04/2018   MCV 93.8 01/04/2018   PLT 283.0 01/04/2018       EOS                                                               0.3                                    01/04/2018       Lab Results  Component Value Date   TSH 1.20 01/04/2018     Lab Results  Component Value Date   PROBNP 73.0 01/04/2018                                Assessment & Plan:

## 2018-01-05 ENCOUNTER — Encounter: Payer: Self-pay | Admitting: Internal Medicine

## 2018-01-05 NOTE — Assessment & Plan Note (Signed)
Quit smoking 2002  Spirometry 09/27/12  FEV1  1.06 (44%) ratio 47  - Spirometry 05/04/2017  FEV1 0.32 (16%)  Ratio 81 but f/v not physiologic   - 05/04/2017    > try symb 80 2bid as cough is the primary concern  - 05/21/2017  After extensive coaching inhaler device  effectiveness =    75% from a baseline of 25% - PFT's  07/17/2017  FEV1 1.17 (64 % ) ratio 64  p 20 % improvement from saba p nothing prior to study with DLCO  54 % corrects to 72  % for alv volume     - 01/04/2018  After extensive coaching inhaler device,  effectiveness =    75% > continue symb 80 2bid   Over using saba as misunderstands when to use so reviewed: I spent extra time with pt today reviewing appropriate use of albuterol for prn use on exertion with the following points: 1) saba is for relief of sob that does not improve by walking a slower pace or resting but rather if the pt does not improve after trying this first. 2) If the pt is convinced, as many are, that saba helps recover from activity faster then it's easy to tell if this is the case by re-challenging : ie stop, take the inhaler, then p 5 minutes try the exact same activity (intensity of workload) that just caused the symptoms and see if they are substantially diminished or not after saba 3) if there is an activity that reproducibly causes the symptoms, try the saba 15 min before the activity on alternate days   If in fact the saba really does help, then fine to continue to use it prn but advised may need to look closer at the maintenance regimen being used to achieve better control of airways disease with exertion.

## 2018-01-05 NOTE — Assessment & Plan Note (Addendum)
01/04/2018   Walked RA  2 laps @ 185 ft each stopped due to  Sob/ fast pace, sats still 89%  No evidence of chf/thyroid dz/ anemia and he does have copd GOLD III criteria so will focus on that for now.

## 2018-01-05 NOTE — Assessment & Plan Note (Signed)
C/w constipation that may be playing a role in his sense of sob > rec double dose of lactulose until develops diarrhea then back off to prior dosing and f/u with PCP prn

## 2018-01-05 NOTE — Assessment & Plan Note (Addendum)
Allergy profile 05/21/2017 >  Eos 0.2 /  IgE  15 RAST neg -  Sinus CT 05/29/2017 1. Bilateral maxillary sinusitis with fluid levels. 2. Chronic left sphenoid sinusitis with mild mucosal thickening > f/u shoemaker: ov 07/09/17 c/w persistent sinusitis > rec FESS> pt canceled   Upper airway cough syndrome (previously labeled PNDS),  is so named because it's frequently impossible to sort out how much is  CR/sinusitis with freq throat clearing (which can be related to primary GERD)   vs  causing  secondary (" extra esophageal")  GERD from wide swings in gastric pressure that occur with throat clearing, often  promoting self use of mint and menthol lozenges that reduce the lower esophageal sphincter tone and exacerbate the problem further in a cyclical fashion.   These are the same pts (now being labeled as having "irritable larynx syndrome" by some cough centers) who not infrequently have a history of having failed to tolerate ace inhibitors,  dry powder inhalers or biphosphonates or report having atypical/extraesophageal reflux symptoms that don't respond to standard doses of PPI  and are easily confused as having aecopd or asthma flares by even experienced allergists/ pulmonologists (myself included).  Of the three most common causes of  Sub-acute / recurrent or chronic cough, only one (GERD)  can actually contribute to/ trigger  the other two (asthma and post nasal drip syndrome)  and perpetuate the cylce of cough.  While not intuitively obvious, many patients with chronic low grade reflux do not cough until there is a primary insult that disturbs the protective epithelial barrier and exposes sensitive nerve endings.   This is typically viral but can due to PNDS and  either may apply here.     The point is that once this occurs, it is difficult to eliminate the cycle  using anything but a maximally effective acid suppression regimen at least in the short run, accompanied by an appropriate diet to address non  acid GERD and control / eliminate the cough if possible using non-narcotic cough meds due to ongoing issues with constipation      May well be the sinusitis is still present and the active cause for his coughing fits though now dry quality noted makes it less likely but certainly doesn't rule it out (sino-brochial reflex does not require direct drainage to trigger symptoms.    For now rec max rx for gerd/ use desym for cough, and keep dose of ICS low to keep from irritating his upper airways/ repeat Sinus CT and re-refer to ENT if needed    I had an extended discussion with the patient reviewing all relevant studies completed to date and  lasting 25 minutes of a 40  minute extended acute office visit addressing multiple   severe non-specific but potentially very serious refractory respiratory symptoms of uncertain and potentially multiple  etiologies.   Each maintenance medication was reviewed in detail including most importantly the difference between maintenance and prns and under what circumstances the prns are to be triggered using an action plan format that is not reflected in the computer generated alphabetically organized AVS.    Please see AVS for specific instructions unique to this office visit that I personally wrote and verbalized to the the pt in detail and then reviewed with pt  by my nurse highlighting any changes in therapy/plan of care  recommended at today's visit.

## 2018-01-07 NOTE — Progress Notes (Signed)
Spoke with pt and notified of results per Dr. Wert. Pt verbalized understanding and denied any questions. 

## 2018-01-25 ENCOUNTER — Other Ambulatory Visit: Payer: Self-pay | Admitting: Internal Medicine

## 2018-02-01 ENCOUNTER — Ambulatory Visit (INDEPENDENT_AMBULATORY_CARE_PROVIDER_SITE_OTHER): Payer: PPO | Admitting: Internal Medicine

## 2018-02-01 ENCOUNTER — Encounter: Payer: Self-pay | Admitting: Internal Medicine

## 2018-02-01 VITALS — BP 114/72 | HR 76 | Ht 65.0 in | Wt 161.0 lb

## 2018-02-01 DIAGNOSIS — R05 Cough: Secondary | ICD-10-CM | POA: Diagnosis not present

## 2018-02-01 DIAGNOSIS — R0609 Other forms of dyspnea: Secondary | ICD-10-CM

## 2018-02-01 DIAGNOSIS — R059 Cough, unspecified: Secondary | ICD-10-CM

## 2018-02-01 DIAGNOSIS — J449 Chronic obstructive pulmonary disease, unspecified: Secondary | ICD-10-CM | POA: Diagnosis not present

## 2018-02-01 DIAGNOSIS — R058 Other specified cough: Secondary | ICD-10-CM

## 2018-02-01 MED ORDER — ALBUTEROL SULFATE (2.5 MG/3ML) 0.083% IN NEBU: 2.5000 mg | INHALATION_SOLUTION | Freq: Four times a day (QID) | RESPIRATORY_TRACT | 0 refills | Status: DC | PRN

## 2018-02-01 MED ORDER — ALBUTEROL SULFATE HFA 108 (90 BASE) MCG/ACT IN AERS: INHALATION_SPRAY | RESPIRATORY_TRACT | 1 refills | Status: DC

## 2018-02-01 NOTE — Assessment & Plan Note (Signed)
Allergy profile 05/21/2017 >  Eos 0.2 /  IgE  15 RAST neg -  Sinus CT 05/29/2017 1. Bilateral maxillary sinusitis with fluid levels. 2. Chronic left sphenoid sinusitis with mild mucosal thickening > f/u shoemaker: ov 07/09/17 c/w persistent sinusitis > rec FESS> pt canceled   - repeat Sinus CT 02/01/2018 >>>    Upper airway cough syndrome (previously labeled PNDS),  is so named because it's frequently impossible to sort out how much is  CR/sinusitis with freq throat clearing (which can be related to primary GERD)   vs  causing  secondary (" extra esophageal")  GERD from wide swings in gastric pressure that occur with throat clearing, often  promoting self use of mint and menthol lozenges that reduce the lower esophageal sphincter tone and exacerbate the problem further in a cyclical fashion.   These are the same pts (now being labeled as having "irritable larynx syndrome" by some cough centers) who not infrequently have a history of having failed to tolerate ace inhibitors,  dry powder inhalers or biphosphonates or report having atypical/extraesophageal reflux symptoms that don't respond to standard doses of PPI  and are easily confused as having aecopd or asthma flares by even experienced allergists/ pulmonologists (myself included).   Pt does not recall ent recs and the absence of significant mucus production/ noct flares is against sinusitis as cause and more in favor of irritable larynx syndrome at this point so if conservative rx ( delsym/ non-mint hard rock candy) doesn't work then try gabapentin next

## 2018-02-01 NOTE — Patient Instructions (Addendum)
Work on inhaler technique:  relax and gently blow all the way out then take a nice smooth deep breath back in, triggering the inhaler at same time you start breathing in.  Hold for up to 5 seconds if you can. Blow out thru nose. Rinse and gargle with water when done  For cough use delsym up to 2 tsp every 12 hours as needed      GERD (REFLUX)  is an extremely common cause of respiratory symptoms just like yours , many times with no obvious heartburn at all.    It can be treated with medication, but also with lifestyle changes including elevation of the head of your bed (ideally with 6 inch  bed blocks),  Smoking cessation, avoidance of late meals, excessive alcohol, and avoid fatty foods, chocolate, peppermint, colas, red wine, and acidic juices such as orange juice.  NO MINT OR MENTHOL PRODUCTS SO NO COUGH DROPS   USE SUGARLESS CANDY INSTEAD (Jolley ranchers or Stover's or Life Savers) or even ice chips will also do - the key is to swallow to prevent all throat clearing. NO OIL BASED VITAMINS - use powdered substitutes.    Please see patient coordinator before you leave today  to schedule sinus CT and I will call you with results      Please schedule a follow up office visit in 6 weeks, call sooner if needed with all medications /inhalers/ solutions in hand so we can verify exactly what you are taking. This includes all medications from all doctors and over the counters - PLEASE separate them into two bags:  the ones you take automatically, no matter what, vs the ones you take just when you feel you need them "BAG #2 is UP TO YOU"  - this will really help [place] help you take your medications more effectively.

## 2018-02-01 NOTE — Progress Notes (Addendum)
Subjective:    Patient ID: Alexander [name], male    DOB: 04/16/1933    MRN: [id number]    Brief patient profile:  84  yobm quit smoking 04/2000    gold III COPD & dyspnea    01/09/13  Cardiac evaluation - echo nml  Stress test - peak VO2 68%, limited by ventilation  c. cath Alexander [name]) -11/10 - nml LV fn, nml coronaries  Spirometry >>FEV1 1.14 - 45% -moderate airway obstruction       05/04/2017 acute extended ov/Alexander [name] re:  COPD III  Really not clear what meds he's been taking nor is his wife sure Chief Complaint  Patient presents with  . Acute Visit    productive cough with clear,gray sputum,SOB w/ activity,feels it has improved with the inhaler he uses  daily cough x 3 months, worse in am but all day long and doe = MMRC3 = can't walk 100 yards even at a slow pace at a flat grade s stopping due to sob  - still shops at [facility] but can't get around without resting  Assoc rhinitis/ nasal congestion  Very confused again with meds  rec Symbicort 80  Take 2 puffs first thing in am and then another 2 puffs about 12 hours later.  Pantoprazole (protonix) 40 mg   Take  30-60 min before first meal of the day and Pepcid (famotidine)  20 mg one @  bedtime until return to office Prednisone 10 mg take  4 each am x 2 days,   2 each am x 2 days,  1 each am x 2 days and stop   Augmentin 875 mg take one pill twice daily  X 10 days - take at breakfast and supper with large glass of water.  GERD diet  Work on inhaler technique   Please schedule a follow up office visit in 2 weeks, sooner if needed with all medications / inhalers / solutions    05/21/2017  f/u ov/Alexander [name] re:  COPD GOLD III / main problem is coughing > sob  Chief Complaint  Patient presents with  . Follow-up    Cough is about the same. He has not been looking at what color sputum he is producing. He still has abx left over, he has only been taking 1 tablet daily.   Cough was gone to his satisfaction until 3 days prior to OV  Then acute worse/  min productive/ still on augmentin  Wife has same "cold"   pt brought symbicort 80 sample well past the red line/ again confused with meds Dspnea:  No change = MMRC3 = can't walk 100 yards even at a slow pace at a flat grade s stopping due to sob  / not using neb saba at all or following action plan  Sleep: disrupted by coughing fits now and has to sleep propped up  rec Plan A = Automatic = symbicort 80 Take 2 puffs first thing in am and then another 2 puffs about 12 hours later.  Work on inhaler technique:   Plan B = Backup Only use your albuterol as a rescue medicatio Best cough medicine > delsym 2 tsp every 12 hours and if still coughing supplement with Tylenol #3 Finish your antibiotics Please see patient coordinator before you leave today  to schedule sinus CT  Please remember to go to the lab department downstairs in the basement  for your tests - we will call you with the results when they are available.  Please schedule a follow up office visit in 2 weeks, sooner if needed  with all medications /inhalers/ solutions in hand so we can verify exactly what you are taking. This includes all medications from all doctors and over the counters   06/04/2017  f/u ov/Alexander [name] re:  Ab/ sinusitis  Chief Complaint  Patient presents with  . Follow-up    Increased cough with grey sputum since the last visit.   using saba neb several times a day  Dyspnea:  MMRC2 = can't walk a nl pace on a flat grade s sob but does fine slow and flat  Cough: esp after supper variably productive min slt dark mucus Sleep: ok flat   Main complaints are related to nasal congestion   rec No change in medications for now Keep appt to see Alexander [name] > rec FESS> did not do       10/18/2017  f/u ov/Alexander [name] re:  Copd GOLD III Chief Complaint  Patient presents with  . Follow-up    Breathing has improved some. He uses his neb every am.    Dyspnea:  MMRC2 = can't walk a nl pace on a flat grade s sob but does fine slow and  flat   Cough: not a problem SABA use: confused, doesn't give straight answers  rec Plan A = Automatic = Symbicort 80 Take 2 puffs first thing in am and then another 2 puffs about 12 hours later.  Work on inhaler technique:  relax and gently blow all the way out then take a nice smooth deep breath back in, triggering the inhaler at same time you start breathing in.  Hold for up to 5 seconds if you can. Blow out thru nose. Rinse and gargle with water when done Plan B = Backup Only use your albuterol as a rescue medication  Plan C = Crisis - only use your albuterol nebulizer if you first try Plan B   BUT YOU MUST BRING ALL ACTIVE MEDICATIONS     01/04/2018  Acute ov/Alexander [name] re: cough/ sob worse/ brought all meds/inhalers but doesn't have proair any more  Chief Complaint  Patient presents with  . Acute Visit    Pt c/o increased SOB x 2 wks. He is using his albuterol neb daily and states it makes him "sick". He has not been using his albuterol inhaler. He has been coughing up yellow sputum for the past month.  doe = MMRC3 = can't walk 100 yards even at a slow pace at a flat grade s stopping due to sob   Cough severe and  worse after supper but not really productive of excess/ purulent sputum or mucus plugs   Sleeping flat on 2 pillows ok  rec Continue pantoprazole 40 mg Take 30-60 min before first meal of the day / famotidine 40 mg after supper  GERD diet  Plan A = Automatic = Symbicort 80 Take 2 puffs first thing in am and then another 2 puffs about 12 hours later.  Work on inhaler technique:   Plan B = Backup Only use your albuterol as a rescue medication   Plan C = Crisis - only use your albuterol nebulizer if you first try Plan B and it fails to help > ok to use the nebulizer up to every 4 hours but if start needing it regularly call for immediate appointment  For cough >>> Take delsym two tsp every 12 hours to suppress the urge to cough.   For constipation:  Double the  dose of lactulose  you are taking until stools turn soft or you get diarrhea then back off to previous dose Please remember to go to the lab and x-ray department downstairs in the basement  for your tests - we will call you with the results when they are available.  Please schedule a follow up office visit in 4 weeks, sooner if needed  with all medications /inhalers/ solutions in hand so we can verify exactly what you are taking. This includes all medications from all doctors and over the counters       02/01/2018  f/u ov/Hayes Czaja re:  UACS > sob  Did not bring all meds/ out of saba completely / poor hfa  Chief Complaint  Patient presents with  . Follow-up    Breathing is unchanged. He states he does not have an albuterol inhaler or neb.   Dyspnea: did 4 hours of shopping on day before ov which is much better than last ov though"breathing feels the same"  Cough: constant day > noct throat clearing since 09/2017  Sleeping: bed flat two pillows  SABA use: none 02: none     No obvious day to day or daytime variability or assoc excess/ purulent sputum or mucus plugs or hemoptysis or cp or chest tightness, subjective wheeze or overt sinus or hb symptoms.   Sleeping as above  without nocturnal  or early am exacerbation  of respiratory  c/o's or need for noct saba. Also denies any obvious fluctuation of symptoms with weather or environmental changes or other aggravating or alleviating factors except as outlined above   No unusual exposure hx or h/o childhood pna/ asthma or knowledge of premature birth.  Current Allergies, Complete Past Medical History, Past Surgical History, Family History, and Social History were reviewed in [employer] record.  ROS  The following are not active complaints unless bolded Hoarseness, sore throat, dysphagia, dental problems, itching, sneezing,  nasal congestion or discharge of excess mucus or purulent secretions, ear ache,   fever, chills, sweats, unintended wt  loss or wt gain, classically pleuritic or exertional cp,  orthopnea pnd or arm/hand swelling  or leg swelling, presyncope, palpitations, abdominal pain, anorexia, nausea, vomiting, diarrhea  or change in bowel habits or change in bladder habits, change in stools or change in urine, dysuria, hematuria,  rash, arthralgias, visual complaints, headache, numbness, weakness or ataxia or problems with walking or coordination,  change in mood or  memory.        Current Meds  Medication Sig  . bisacodyl (DULCOLAX) 5 MG EC tablet Take 1 tablet (5 mg total) by mouth 2 (two) times daily.  . budesonide-formoterol (SYMBICORT) 80-4.5 MCG/ACT inhaler Take 2 puffs first thing in am and then another 2 puffs about 12 hours later.  Marland Kitchen guaiFENesin (MUCINEX) 600 MG 12 hr tablet Take 600 mg by mouth 2 (two) times daily as needed.  . pantoprazole (PROTONIX) 40 MG tablet Take 1 tablet (40 mg total) by mouth daily as needed (for heartburn). (Patient taking differently: Take 40 mg by mouth daily before breakfast. )  . rivastigmine (EXELON) 1.5 MG capsule Take 1 capsule (1.5 mg total) by mouth 2 (two) times daily.  Marland Kitchen triamcinolone cream (KENALOG) 0.1 % Apply 1 application topically 2 (two) times daily.               Objective:   Physical Exam  amb bm with vigorous throat cleairng   02/01/2018    161  01/04/2018  166  10/18/2017      166 07/17/2017       173   06/04/2017      176  05/21/2017       175  05/04/2017       173   03/17/2016    182   02/03/14 177 lb (80.287 kg)  01/27/14 177 lb 12 oz (80.627 kg)  01/06/14 173 lb 8 oz (78.699 kg)     Vital signs reviewed - Note on arrival 02 sats  96% on RA           HEENT: Full dentures - nl  oropharynx which is pristine.  Nl external ear canals without cough reflex -  Mild bilateral non-specific turbinate edema     NECK :  without JVD/Nodes/TM/ nl carotid upstrokes bilaterally   LUNGS: no acc muscle use,  Mild barrel  contour chest wall with bilateral   Distant bs s audible wheeze and  without cough on insp or exp maneuver and mild  Hyperresonant  to  percussion bilaterally     CV:  RRR  no s3 or murmur or increase in P2, and no edema   ABD:  soft and nontender with pos late  insp Hoover's  in the supine position. No bruits or organomegaly appreciated, bowel sounds nl  MS:   Nl gait/  ext warm without deformities, calf tenderness, cyanosis or clubbing No obvious joint restrictions   SKIN: warm and dry without lesions    NEURO:  alert, approp, nl sensorium with  no motor or cerebellar deficits apparent.           Assessment & Plan:

## 2018-02-01 NOTE — Assessment & Plan Note (Signed)
Quit smoking 2002  Spirometry 09/27/12  FEV1  1.06 (44%) ratio 47  - Spirometry 05/04/2017  FEV1 0.32 (16%)  Ratio 81 but f/v not physiologic   - 05/04/2017    > try symb 80 2bid as cough is the primary concern  - 05/21/2017  After extensive coaching inhaler device  effectiveness =    75% from a baseline of 25% - PFT's  07/17/2017  FEV1 1.17 (64 % ) ratio 64  p 20 % improvement from saba p nothing prior to study with DLCO  54 % corrects to 72  % for alv volume     -  02/01/2018  After extensive coaching inhaler device,  effectiveness =    75% (poor trigger, short Ti)    Copd component improving despite suboptimal hfa > no change low dose symbicort given tendency to uacs

## 2018-02-01 NOTE — Assessment & Plan Note (Signed)
01/04/2018   Walked RA  2 laps @ 185 ft each stopped due to  Sob/ fast pace, sats still 89% - 02/01/2018  Walked RA x 3 laps @ 185 ft each stopped due to  End of study, fast pace, min sob  And no desat      Clearly improved from priors with clear lungs on exam again pointing to uacs and not copd as his limiting problem    I had an extended discussion with the patient and wife reviewing all relevant studies completed to date and  lasting 15 to 20 minutes of a 25 minute visit    See device teaching which extended face to face time for this visit.  Each maintenance medication was reviewed in detail including emphasizing most importantly the difference between maintenance and prns and under what circumstances the prns are to be triggered using an action plan format that is not reflected in the computer generated alphabetically organized AVS which I have not found useful in most complex patients, especially with respiratory illnesses  Please see AVS for specific instructions unique to this visit that I personally wrote and verbalized to the the pt in detail and then reviewed with pt  by my nurse highlighting any  changes in therapy recommended at today's visit to their plan of care.    rec return with all meds in hand using a trust but verify approach to confirm accurate Medication  Reconciliation The principal here is that until we are certain that the  patients are doing what we've asked, it makes no sense to ask them to do more.

## 2018-02-13 ENCOUNTER — Ambulatory Visit (INDEPENDENT_AMBULATORY_CARE_PROVIDER_SITE_OTHER)
Admission: RE | Admit: 2018-02-13 | Discharge: 2018-02-13 | Disposition: A | Discharge status: Home / Self Care | Payer: PPO | Source: Ambulatory Visit | Attending: Internal Medicine | Admitting: Internal Medicine

## 2018-02-13 DIAGNOSIS — R05 Cough: Secondary | ICD-10-CM

## 2018-02-13 DIAGNOSIS — R058 Other specified cough: Secondary | ICD-10-CM

## 2018-02-13 DIAGNOSIS — R059 Cough, unspecified: Secondary | ICD-10-CM

## 2018-02-13 DIAGNOSIS — J329 Chronic sinusitis, unspecified: Secondary | ICD-10-CM | POA: Diagnosis not present

## 2018-03-08 ENCOUNTER — Ambulatory Visit (INDEPENDENT_AMBULATORY_CARE_PROVIDER_SITE_OTHER): Payer: PPO | Admitting: Internal Medicine

## 2018-03-08 ENCOUNTER — Encounter: Payer: Self-pay | Admitting: Internal Medicine

## 2018-03-08 VITALS — BP 122/76 | HR 90 | Temp 97.9°F | Ht 65.0 in | Wt 169.0 lb

## 2018-03-08 DIAGNOSIS — T7840XA Allergy, unspecified, initial encounter: Secondary | ICD-10-CM | POA: Diagnosis not present

## 2018-03-08 DIAGNOSIS — J309 Allergic rhinitis, unspecified: Secondary | ICD-10-CM | POA: Diagnosis not present

## 2018-03-08 DIAGNOSIS — J449 Chronic obstructive pulmonary disease, unspecified: Secondary | ICD-10-CM

## 2018-03-08 DIAGNOSIS — R739 Hyperglycemia, unspecified: Secondary | ICD-10-CM

## 2018-03-08 MED ORDER — TRIAMCINOLONE ACETONIDE 55 MCG/ACT NA AERO: 2.0000 | INHALATION_SPRAY | Freq: Every day | NASAL | 12 refills | Status: DC

## 2018-03-08 MED ORDER — SILDENAFIL CITRATE 100 MG PO TABS: 50.0000 mg | ORAL_TABLET | Freq: Every day | ORAL | 11 refills | Status: DC | PRN

## 2018-03-08 MED ORDER — METHYLPREDNISOLONE ACETATE 80 MG/ML IJ SUSP: 80.0000 mg | Freq: Once | INTRAMUSCULAR | Status: DC

## 2018-03-08 MED ORDER — ALBUTEROL SULFATE HFA 108 (90 BASE) MCG/ACT IN AERS: INHALATION_SPRAY | RESPIRATORY_TRACT | 5 refills | Status: DC

## 2018-03-08 MED ORDER — CETIRIZINE HCL 10 MG PO TABS: 10.0000 mg | ORAL_TABLET | Freq: Every day | ORAL | 11 refills | Status: DC
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
## 2018-03-08 NOTE — Patient Instructions (Signed)
.  You had the steroid shot today  Please take all new medication as prescribed - the zyrtec and nasacort for allergies  OK to continue the Mucinex twice per day  Please continue all other medications as before, and refills have been done if requested.  Please have the pharmacy call with any other refills you may need.  Please continue your efforts at being more active, low cholesterol diet, and weight control.  Please keep your appointments with your specialists as you may have planned  Please return in 6 months, or sooner if needed, with Lab testing done 3-5 days before

## 2018-03-08 NOTE — Assessment & Plan Note (Signed)
stable overall by history and exam, recent data reviewed with pt, and pt to continue medical treatment as before,  to f/u any worsening symptoms or concerns  

## 2018-03-08 NOTE — Assessment & Plan Note (Signed)
To add zyrtec, nasacort asd, for depomedrol IM 80 today, cont mucinex bid

## 2018-03-15 ENCOUNTER — Encounter: Payer: Self-pay | Admitting: Internal Medicine

## 2018-03-15 ENCOUNTER — Ambulatory Visit (INDEPENDENT_AMBULATORY_CARE_PROVIDER_SITE_OTHER): Payer: PPO | Admitting: Internal Medicine

## 2018-03-15 DIAGNOSIS — J449 Chronic obstructive pulmonary disease, unspecified: Secondary | ICD-10-CM | POA: Diagnosis not present

## 2018-03-15 NOTE — Patient Instructions (Addendum)
Plan A =  Symbicort 80 Take 2 puffs first thing in am and then another 2 puffs about 12 hours later.   Plan B = Back up >>>Only use your albuterol nebulizer as a rescue medication to be used if you can't catch your breath by resting or doing a relaxed purse lip breathing pattern.  - The less you use it, the better it will work when you need it. - Ok to use up to  every 4 hours if you must but call for immediate appointment if use goes up over your usual need   Please schedule a follow up visit in 6  months but call sooner if needed

## 2018-03-15 NOTE — Progress Notes (Signed)
Subjective:   Patient ID: Alexander MorelHarry L [name], male    DOB: 1932-10-09    MRN: [id number]     Brief patient profile:  85  yobm quit smoking 04/2000    gold III COPD & dyspnea    01/09/13  Cardiac evaluation - echo nml  Stress test - peak VO2 68%, limited by ventilation  c. cath [name] -11/10 - nml LV fn, nml coronaries  Spirometry >>FEV1 1.14 - 45% -moderate airway obstruction       05/04/2017 acute extended ov/Alexander [name] re:  COPD III  Really not clear what meds he's been taking nor is his wife sure Chief Complaint  Patient presents with  . Acute Visit    productive cough with clear,gray sputum,SOB w/ activity,feels it has improved with the inhaler he uses  daily cough x 3 months, worse in am but all day long and doe = MMRC3 = can't walk 100 yards even at a slow pace at a flat grade s stopping due to sob  - [facility] but can't get around without resting  Assoc rhinitis/ nasal congestion  Very confused again with meds  rec Symbicort 80  Take 2 puffs first thing in am and then another 2 puffs about 12 hours later.  Pantoprazole (protonix) 40 mg   Take  30-60 min before first meal of the day and Pepcid (famotidine)  20 mg one @  bedtime until return to office Prednisone 10 mg take  4 each am x 2 days,   2 each am x 2 days,  1 each am x 2 days and stop   Augmentin 875 mg take one pill twice daily  X 10 days - take at breakfast and supper with large glass of water.  GERD diet  Work on inhaler technique   Please schedule a follow up office visit in 2 weeks, sooner if needed with all medications / inhalers / solutions    05/21/2017  f/u ov/Alexander [name] re:  COPD GOLD III / main problem is coughing > sob  Chief Complaint  Patient presents with  . Follow-up    Cough is about the same. He has not been looking at what color sputum he is producing. He still has abx left over, he has only been taking 1 tablet daily.   Cough was gone to his satisfaction until 3 days prior to OV  Then acute  worse/ min productive/ still on augmentin  Wife has same "cold"   pt brought symbicort 80 sample well past the red line/ again confused with meds Dspnea:  No change = MMRC3 = can't walk 100 yards even at a slow pace at a flat grade s stopping due to sob  / not using neb saba at all or following action plan  Sleep: disrupted by coughing fits now and has to sleep propped up  rec Plan A = Automatic = symbicort 80 Take 2 puffs first thing in am and then another 2 puffs about 12 hours later.  Work on inhaler technique:   Plan B = Backup Only use your albuterol as a rescue medicatio Best cough medicine > delsym 2 tsp every 12 hours and if still coughing supplement with Tylenol #3 Finish your antibiotics Please see patient coordinator before you leave today  to schedule sinus CT  Please remember to go to the lab department downstairs in the basement  for your tests - we will call you with the results when they are available.  Please schedule a follow up office visit in 2 weeks,     06/04/2017  f/u ov/Alexander [name] re:  Ab/ sinusitis  Chief Complaint  Patient presents with  . Follow-up    Increased cough with grey sputum since the last visit.   using saba neb several times a day  Dyspnea:  MMRC2 = can't walk a nl pace on a flat grade s sob but does fine slow and flat  Cough: esp after supper variably productive min slt dark mucus Sleep: ok flat   Main complaints are related to nasal congestion   rec No change in medications for now Keep appt to see Dr Annalee Genta > rec FESS> did not do       10/18/2017  f/u ov/Alexander [name] re:  Copd GOLD III Chief Complaint  Patient presents with  . Follow-up    Breathing has improved some. He uses his neb every am.    Dyspnea:  MMRC2 = can't walk a nl pace on a flat grade s sob but does fine slow and flat   Cough: not a problem SABA use: confused, doesn't give straight answers  rec Plan A = Automatic = Symbicort 80 Take 2 puffs first thing in am and then another 2  puffs about 12 hours later.  Work on inhaler technique:  relax and gently blow all the way out then take a nice smooth deep breath back in, triggering the inhaler at same time you start breathing in.  Hold for up to 5 seconds if you can. Blow out thru nose. Rinse and gargle with water when done Plan B = Backup Only use your albuterol as a rescue medication  Plan C = Crisis - only use your albuterol nebulizer if you first try Plan B   BUT YOU MUST BRING ALL ACTIVE MEDICATIONS     01/04/2018  Acute ov/Alexander [name] re: cough/ sob worse/ brought all meds/inhalers but doesn't have proair any more  Chief Complaint  Patient presents with  . Acute Visit    Pt c/o increased SOB x 2 wks. He is using his albuterol neb daily and states it makes him "sick". He has not been using his albuterol inhaler. He has been coughing up yellow sputum for the past month.  doe = MMRC3 = can't walk 100 yards even at a slow pace at a flat grade s stopping due to sob   Cough severe and  worse after supper but not really productive of excess/ purulent sputum or mucus plugs   Sleeping flat on 2 pillows ok  rec Continue pantoprazole 40 mg Take 30-60 min before first meal of the day / famotidine 40 mg after supper  GERD diet  Plan A = Automatic = Symbicort 80 Take 2 puffs first thing in am and then another 2 puffs about 12 hours later.  Work on inhaler technique:   Plan B = Backup Only use your albuterol as a rescue medication   Plan C = Crisis - only use your albuterol nebulizer if you first try Plan B and it fails to help > ok to use the nebulizer up to every 4 hours but if start needing it regularly call for immediate appointment  For cough >>> Take delsym two tsp every 12 hours to suppress the urge to cough.   For constipation:  Double the dose of lactulose you are taking until stools turn soft or you get diarrhea then back off to previous dose Please remember to go to the lab and x-ray  department downstairs in the basement   for your tests - we will call you with the results when they are available.  Please schedule a follow up office visit in 4 weeks, sooner if needed  with all medications /inhalers/ solutions in hand so we can verify exactly what you are taking. This includes all medications from all doctors and over the counters       02/01/2018  f/u ov/Alexander [name] re:  UACS > sob  Did not bring all meds/ out of saba completely / poor hfa  Chief Complaint  Patient presents with  . Follow-up    Breathing is unchanged. He states he does not have an albuterol inhaler or neb.   Dyspnea: did 4 hours of shopping on day before ov which is much better than last ov though"breathing feels the same"  Cough: constant day > noct throat clearing since 09/2017  Sleeping: bed flat two pillows  rec Work on inhaler technique:   For cough use delsym up to 2 tsp every 12 hours as needed  GERD idet  Sinus CT   improved vs  [employer] Please schedule a follow up office visit in 6 weeks, call sooner if needed with all medications /inhalers/ solutions in hand so we can verify exactly what you are taking.     03/15/2018  f/u ov/Alexander [name] re: uacs  / ab symb 80 2bid did not bring meds  Chief Complaint  Patient presents with  . Follow-up    Breathing is overall doing well.  He has had some cough with yellow sputum. He is using his albuterol inhaler 2 x daily on average.   Dyspnea:  Not limited by breathing from desired activities   Cough: minimal daytime / slt beige < a tsp not exac in am  Sleeping: bed is flat 2 pillows  SABA use: never purchased.  No need for saba / no need for neb . 02: none    No obvious day to day or daytime variability or assoc   mucus plugs or hemoptysis or cp or chest tightness, subjective wheeze or overt sinus or hb symptoms.   Sleeping as above  without nocturnal  or early am exacerbation  of respiratory  c/o's or need for noct saba. Also denies any obvious fluctuation of symptoms with weather or environmental  changes or other aggravating or alleviating factors except as outlined above   No unusual exposure hx or h/o childhood pna/ asthma or knowledge of premature birth.  Current Allergies, Complete Past Medical History, Past Surgical History, Family History, and Social History were reviewed in [facility] electronic medical record.  ROS  The following are not active complaints unless bolded Hoarseness, sore throat, dysphagia, dental problems, itching, sneezing,  nasal congestion or discharge of excess mucus or purulent secretions, ear ache,   fever, chills, sweats, unintended wt loss or wt gain, classically pleuritic or exertional cp,  orthopnea pnd or arm/hand swelling  or leg swelling, presyncope, palpitations, abdominal pain, anorexia, nausea, vomiting, diarrhea  or change in bowel habits or change in bladder habits, change in stools or change in urine, dysuria, hematuria,  rash, arthralgias, visual complaints, headache, numbness, weakness or ataxia or problems with walking or coordination,  change in mood or  memory.        Current Meds  Medication Sig  . albuterol (PROAIR HFA) 108 (90 Base) MCG/ACT inhaler 2 puffs every 4 hours as needed only  if your can't catch your breath  . albuterol (PROVENTIL) (2.5 MG/3ML) 0.083%  nebulizer solution Take 3 mLs (2.5 mg total) by nebulization every 6 (six) hours as needed for wheezing or shortness of breath.  . bisacodyl (DULCOLAX) 5 MG EC tablet Take 1 tablet (5 mg total) by mouth 2 (two) times daily.  . budesonide-formoterol (SYMBICORT) 80-4.5 MCG/ACT inhaler Take 2 puffs first thing in am and then another 2 puffs about 12 hours later.  . cetirizine (ZYRTEC) 10 MG tablet Take 1 tablet (10 mg total) by mouth daily.  Marland Kitchen lactulose (CHRONULAC) 10 GM/15ML solution TAKE 45 MLS (30 G TOTAL) BY MOUTH 2 (TWO) TIMES DAILY AS NEEDED FOR MILD CONSTIPATION.  Marland Kitchen pantoprazole (PROTONIX) 40 MG tablet Take 1 tablet (40 mg total) by mouth daily as needed (for heartburn).  (Patient taking differently: Take 40 mg by mouth daily before breakfast. )  . rivastigmine (EXELON) 1.5 MG capsule Take 1 capsule (1.5 mg total) by mouth 2 (two) times daily.  . sildenafil (VIAGRA) 100 MG tablet Take 0.5-1 tablets (50-100 mg total) by mouth daily as needed for erectile dysfunction.  . triamcinolone (NASACORT) 55 MCG/ACT AERO nasal inhaler Place 2 sprays into the nose daily.  Marland Kitchen triamcinolone cream (KENALOG) 0.1 % Apply 1 application topically 2 (two) times daily.                 Objective:   Physical Exam  amb bm nad again very confused with details of care as was his wife.  03/15/2018    165  02/01/2018    161  01/04/2018      166  10/18/2017      166 07/17/2017       173   06/04/2017      176  05/21/2017       175  05/04/2017       173   03/17/2016    182   02/03/14 177 lb (80.287 kg)  01/27/14 177 lb 12 oz (80.627 kg)  01/06/14 173 lb 8 oz (78.699 kg)     Vital signs reviewed - Note on arrival 02 sats  96% on RA         HEENT:full dentures/ nl   and oropharynx. Nl external ear canals without cough reflex - mild bilateral non-specific turbinate edema     NECK :  without JVD/Nodes/TM/ nl carotid upstrokes bilaterally   LUNGS: no acc muscle use,  Nl contour chest which is clear to A and P bilaterally without cough on insp or exp maneuvers   CV:  RRR  no s3 or murmur or increase in P2, and no edema   ABD:  soft and nontender with nl inspiratory excursion in the supine position. No bruits or organomegaly appreciated, bowel sounds nl  MS:  Nl gait/ ext warm without deformities, calf tenderness, cyanosis or clubbing No obvious joint restrictions   SKIN: warm and dry without lesions    NEURO:  alert, approp, nl sensorium with  no motor or cerebellar deficits apparent.            Assessment & Plan:

## 2018-03-16 ENCOUNTER — Encounter: Payer: Self-pay | Admitting: Internal Medicine

## 2018-03-16 NOTE — Assessment & Plan Note (Signed)
Quit smoking 2002  Spirometry 09/27/12  FEV1  1.06 (44%) ratio 47  - Spirometry 05/04/2017  FEV1 0.32 (16%)  Ratio 81 but f/v not physiologic   - 05/04/2017    > try symb 80 2bid as cough is the primary concern  - 05/21/2017  After extensive coaching inhaler device  effectiveness =    75% from a baseline of 25% - PFT's  07/17/2017  FEV1 1.17 (64 % ) ratio 64  p 20 % improvement from saba p nothing prior to study with DLCO  54 % corrects to 72  % for alv volume   -  02/01/2018  After extensive coaching inhaler device,  effectiveness =    75% (poor trigger, short Ti)    I had an extended discussion with the patient reviewing all relevant studies completed to date and  lasting 15 to 20 minutes of a 25 minute visit on the following ongoing concerns:   Despite inability to do accurate med reconciliation, he's doing well on present rx and no changes needed   However, I had another extended discussion with pt and wife of the dangers of inaccurate medication reconciliation and that this was a risk to outpt rx that neither of them has taken seriously to date.   Urged for all ov's going forward with all providers that he return  with all meds in hand using a trust but verify approach to confirm accurate Medication  Reconciliation The principal here is that until we are certain that the  patients are doing what we've asked, it makes no sense to ask them to do more.    Each maintenance medication was reviewed in detail including most importantly the difference between maintenance and as needed and under what circumstances the prns are to be used.  Please see AVS for specific  Instructions which are unique to this visit and I personally typed out  which were reviewed in detail in writing with the patient and a copy provided.      >>> f/u in 6 m, sooner if needed

## 2018-04-12 ENCOUNTER — Encounter: Payer: Self-pay | Admitting: Internal Medicine

## 2018-04-12 ENCOUNTER — Ambulatory Visit (INDEPENDENT_AMBULATORY_CARE_PROVIDER_SITE_OTHER): Payer: PPO | Admitting: Internal Medicine

## 2018-04-12 ENCOUNTER — Ambulatory Visit: Payer: PPO | Admitting: Family Medicine

## 2018-04-12 DIAGNOSIS — I119 Hypertensive heart disease without heart failure: Secondary | ICD-10-CM | POA: Diagnosis not present

## 2018-04-12 DIAGNOSIS — L309 Dermatitis, unspecified: Secondary | ICD-10-CM | POA: Diagnosis not present

## 2018-04-12 DIAGNOSIS — J449 Chronic obstructive pulmonary disease, unspecified: Secondary | ICD-10-CM

## 2018-04-12 MED ORDER — PREDNISONE 10 MG PO TABS: ORAL_TABLET | ORAL | 0 refills | Status: DC

## 2018-04-12 MED ORDER — AZITHROMYCIN 250 MG PO TABS: ORAL_TABLET | ORAL | 1 refills | Status: DC

## 2018-04-12 MED ORDER — METHYLPREDNISOLONE ACETATE 80 MG/ML IJ SUSP
80.0000 mg | Freq: Once | INTRAMUSCULAR | Status: AC
  Administered 2018-04-12: 80 mg via INTRAMUSCULAR

## 2018-04-12 MED ORDER — TRIAMCINOLONE ACETONIDE 0.1 % EX CREA: 1.0000 "application " | TOPICAL_CREAM | Freq: Two times a day (BID) | CUTANEOUS | 2 refills | Status: DC
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
## 2018-04-12 NOTE — Assessment & Plan Note (Signed)
Mild to mod, for depomedrol IM 80, predpac asd, to f/u any worsening symptoms or concerns 

## 2018-04-12 NOTE — Assessment & Plan Note (Signed)
stable overall by history and exam, recent data reviewed with pt, and pt to continue medical treatment as before,  to f/u any worsening symptoms or concerns  

## 2018-04-12 NOTE — Patient Instructions (Signed)
You had the steroid shot today  Please take all new medication as prescribed  - the antibiotic, prednisone, and the cream if needed  Please continue all other medications as before, and refills have been done if requested.  Please have the pharmacy call with any other refills you may need.  Please keep your appointments with your specialists as you may have planned

## 2018-05-01 ENCOUNTER — Other Ambulatory Visit: Payer: Self-pay | Admitting: Internal Medicine

## 2018-05-02 DIAGNOSIS — L710 Perioral dermatitis: Secondary | ICD-10-CM | POA: Diagnosis not present

## 2018-05-21 ENCOUNTER — Encounter: Payer: Self-pay | Admitting: Internal Medicine

## 2018-05-21 ENCOUNTER — Ambulatory Visit (INDEPENDENT_AMBULATORY_CARE_PROVIDER_SITE_OTHER): Payer: PPO | Admitting: Internal Medicine

## 2018-05-21 VITALS — BP 114/72 | HR 74 | Temp 98.0°F | Resp 16 | Ht 65.0 in | Wt 165.0 lb

## 2018-05-21 DIAGNOSIS — H9222 Otorrhagia, left ear: Secondary | ICD-10-CM | POA: Diagnosis not present
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
## 2018-05-21 NOTE — Assessment & Plan Note (Signed)
Small amount of blood in left ear canal without evidence of infection Blood likely related to use of a Q-tip Advised holding off on using Q-tips for several days No treatment needed

## 2018-05-30 ENCOUNTER — Ambulatory Visit (INDEPENDENT_AMBULATORY_CARE_PROVIDER_SITE_OTHER): Payer: PPO | Admitting: Internal Medicine

## 2018-05-30 ENCOUNTER — Ambulatory Visit: Payer: PPO | Admitting: Neurology

## 2018-05-30 ENCOUNTER — Other Ambulatory Visit: Payer: Self-pay

## 2018-05-30 ENCOUNTER — Other Ambulatory Visit (INDEPENDENT_AMBULATORY_CARE_PROVIDER_SITE_OTHER): Payer: PPO

## 2018-05-30 ENCOUNTER — Encounter: Payer: Self-pay | Admitting: Internal Medicine

## 2018-05-30 ENCOUNTER — Encounter: Payer: Self-pay | Admitting: Neurology

## 2018-05-30 ENCOUNTER — Encounter

## 2018-05-30 VITALS — BP 118/78 | HR 77 | Temp 98.0°F | Ht 65.0 in | Wt 160.0 lb

## 2018-05-30 VITALS — BP 104/62 | HR 71 | Ht 65.0 in | Wt 160.0 lb

## 2018-05-30 DIAGNOSIS — R21 Rash and other nonspecific skin eruption: Secondary | ICD-10-CM

## 2018-05-30 DIAGNOSIS — Z0001 Encounter for general adult medical examination with abnormal findings: Secondary | ICD-10-CM | POA: Diagnosis not present

## 2018-05-30 DIAGNOSIS — F03A0 Unspecified dementia, mild, without behavioral disturbance, psychotic disturbance, mood disturbance, and anxiety: Secondary | ICD-10-CM

## 2018-05-30 DIAGNOSIS — F0390 Unspecified dementia without behavioral disturbance: Secondary | ICD-10-CM

## 2018-05-30 DIAGNOSIS — F03B0 Unspecified dementia, moderate, without behavioral disturbance, psychotic disturbance, mood disturbance, and anxiety: Secondary | ICD-10-CM

## 2018-05-30 DIAGNOSIS — R739 Hyperglycemia, unspecified: Secondary | ICD-10-CM

## 2018-05-30 DIAGNOSIS — Z0000 Encounter for general adult medical examination without abnormal findings: Secondary | ICD-10-CM | POA: Diagnosis not present

## 2018-05-30 DIAGNOSIS — N529 Male erectile dysfunction, unspecified: Secondary | ICD-10-CM

## 2018-05-30 DIAGNOSIS — F32A Depression, unspecified: Secondary | ICD-10-CM

## 2018-05-30 DIAGNOSIS — F329 Major depressive disorder, single episode, unspecified: Secondary | ICD-10-CM | POA: Diagnosis not present

## 2018-05-30 LAB — CBC WITH DIFFERENTIAL/PLATELET
Basophils Absolute: 0.1 10*3/uL (ref 0.0–0.1)
Basophils Relative: 1.4 % (ref 0.0–3.0)
Eosinophils Absolute: 0.3 10*3/uL (ref 0.0–0.7)
Eosinophils Relative: 7.2 % — ABNORMAL HIGH (ref 0.0–5.0)
HCT: 40.3 % (ref 39.0–52.0)
Hemoglobin: 13.3 g/dL (ref 13.0–17.0)
Lymphocytes Relative: 28 % (ref 12.0–46.0)
Lymphs Abs: 1.1 10*3/uL (ref 0.7–4.0)
MCHC: 33.1 g/dL (ref 30.0–36.0)
MCV: 93.8 fl (ref 78.0–100.0)
MONOS PCT: 11.6 % (ref 3.0–12.0)
Monocytes Absolute: 0.5 10*3/uL (ref 0.1–1.0)
Neutro Abs: 2 10*3/uL (ref 1.4–7.7)
Neutrophils Relative %: 51.8 % (ref 43.0–77.0)
Platelets: 316 10*3/uL (ref 150.0–400.0)
RBC: 4.3 Mil/uL (ref 4.22–5.81)
RDW: 13.3 % (ref 11.5–15.5)
WBC: 3.9 10*3/uL — ABNORMAL LOW (ref 4.0–10.5)

## 2018-05-30 LAB — BASIC METABOLIC PANEL
BUN: 13 mg/dL (ref 6–23)
CO2: 32 mEq/L (ref 19–32)
Calcium: 9.7 mg/dL (ref 8.4–10.5)
Chloride: 103 mEq/L (ref 96–112)
Creatinine, Ser: 1.17 mg/dL (ref 0.40–1.50)
GFR: 72 mL/min (ref >60.00)
Glucose, Bld: 92 mg/dL (ref 70–99)
Potassium: 4.6 mEq/L (ref 3.5–5.1)
Sodium: 142 mEq/L (ref 135–145)

## 2018-05-30 LAB — URINALYSIS, ROUTINE W REFLEX MICROSCOPIC
Bilirubin Urine: NEGATIVE
Leukocytes, UA: NEGATIVE
Nitrite: NEGATIVE
SPECIFIC GRAVITY, URINE: 1.025 (ref 1.000–1.030)
Total Protein, Urine: 30 — AB
Urine Glucose: NEGATIVE
Urobilinogen, UA: 0.2 (ref 0.0–1.0)
pH: 5.5 (ref 5.0–8.0)

## 2018-05-30 LAB — HEPATIC FUNCTION PANEL
ALT: 10 U/L (ref 0–53)
AST: 15 U/L (ref 0–37)
Albumin: 4.4 g/dL (ref 3.5–5.2)
Alkaline Phosphatase: 63 U/L (ref 39–117)
Bilirubin, Direct: 0.2 mg/dL (ref 0.0–0.3)
Total Bilirubin: 0.9 mg/dL (ref 0.2–1.2)
Total Protein: 6.5 g/dL (ref 6.0–8.3)

## 2018-05-30 LAB — LIPID PANEL
Cholesterol: 183 mg/dL (ref 0–200)
HDL: 52.3 mg/dL (ref >39.00)
LDL Cholesterol: 113 mg/dL — ABNORMAL HIGH (ref 0–99)
NonHDL: 130.62
TRIGLYCERIDES: 86 mg/dL (ref 0.0–149.0)
Total CHOL/HDL Ratio: 3
VLDL: 17.2 mg/dL (ref 0.0–40.0)

## 2018-05-30 LAB — TSH: TSH: 1.96 u[IU]/mL (ref 0.35–4.50)

## 2018-05-30 LAB — HEMOGLOBIN A1C: Hgb A1c MFr Bld: 5.6 % (ref 4.6–6.5)

## 2018-05-30 MED ORDER — SILDENAFIL CITRATE 100 MG PO TABS: 50.0000 mg | ORAL_TABLET | Freq: Every day | ORAL | 11 refills | Status: DC | PRN

## 2018-05-30 MED ORDER — MEMANTINE HCL 10 MG PO TABS: 10.0000 mg | ORAL_TABLET | Freq: Two times a day (BID) | ORAL | 11 refills | Status: DC

## 2018-05-30 MED ORDER — TRIAMCINOLONE ACETONIDE 0.1 % EX CREA: 1.0000 "application " | TOPICAL_CREAM | Freq: Two times a day (BID) | CUTANEOUS | 2 refills | Status: DC

## 2018-05-30 MED ORDER — CITALOPRAM HYDROBROMIDE 10 MG PO TABS: 10.0000 mg | ORAL_TABLET | Freq: Every day | ORAL | 3 refills | Status: DC

## 2018-05-30 NOTE — Assessment & Plan Note (Signed)
Mild worsening, for celexa 10 qd

## 2018-05-30 NOTE — Patient Instructions (Signed)
1. Stop the Rivastigmine for a week and see how the dizziness feels. If no change, then dizziness is not from the medication.  2. After a week, start the new medication Memantine 10mg : Take 1 tablet twice a day  3. No further driving  4. Follow-up in 6 months, call for any changes  FALL PRECAUTIONS: Be cautious when walking. Scan the area for obstacles that may increase the risk of trips and falls. When getting up in the mornings, sit up at the edge of the bed for a few minutes before getting out of bed. Consider elevating the bed at the head end to avoid drop of blood pressure when getting up. Walk always in a well-lit room (use night lights in the walls). Avoid area rugs or power cords from appliances in the middle of the walkways. Use a walker or a cane if necessary and consider physical therapy for balance exercise. Get your eyesight checked regularly.  FINANCIAL OVERSIGHT: Supervision, especially oversight when making financial decisions or transactions is also recommended.  HOME SAFETY: Consider the safety of the kitchen when operating appliances like stoves, microwave oven, and blender. Consider having supervision and share cooking responsibilities until no longer able to participate in those. Accidents with firearms and other hazards in the house should be identified and addressed as well.  DRIVING: Regarding driving, in patients with progressive memory problems, driving will be impaired. We advise to have someone else do the driving if trouble finding directions or if minor accidents are reported. Independent driving assessment is available to determine safety of driving.  ABILITY TO BE LEFT ALONE: If patient is unable to contact 911 operator, consider using LifeLine, or when the need is there, arrange for someone to stay with patients. Smoking is a fire hazard, consider supervision or cessation. Risk of wandering should be assessed by caregiver and if detected at any point, supervision and  safe proof recommendations should be instituted.  MEDICATION SUPERVISION: Inability to self-administer medication needs to be constantly addressed. Implement a mechanism to ensure safe administration of the medications.  RECOMMENDATIONS FOR ALL PATIENTS WITH MEMORY PROBLEMS: 1. Continue to exercise (Recommend 30 minutes of walking everyday, or 3 hours every week) 2. Increase social interactions - continue going to [place] and enjoy social gatherings with friends and family 3. Eat healthy, avoid fried foods and eat more fruits and vegetables 4. Maintain adequate blood pressure, blood sugar, and blood cholesterol level. Reducing the risk of stroke and cardiovascular disease also helps promoting better memory. 5. Avoid stressful situations. Live a simple life and avoid aggravations. Organize your time and prepare for the next day in anticipation. 6. Sleep well, avoid any interruptions of sleep and avoid any distractions in the bedroom that may interfere with adequate sleep quality 7. Avoid sugar, avoid sweets as there is a strong link between excessive sugar intake, diabetes, and cognitive impairment The Mediterranean diet has been shown to help patients reduce the risk of progressive memory disorders and reduces cardiovascular risk. This includes eating fish, eat fruits and green leafy vegetables, nuts like almonds and hazelnuts, walnuts, and also use olive oil. Avoid fast foods and fried foods as much as possible. Avoid sweets and sugar as sugar use has been linked to worsening of memory function.  There is always a concern of gradual progression of memory problems. If this is the case, then we may need to adjust level of care according to patient needs. Support, both to the patient and caregiver, should then be put into  place.

## 2018-05-30 NOTE — Assessment & Plan Note (Signed)
C/w mild dermatitis with several small itchy bumps below the lip, for triam cr prn

## 2018-05-30 NOTE — Patient Instructions (Signed)
Please take all new medication as prescribed - the celexa 10 mg for depression, and the steroid cream for the rash below the lower lip area  Please continue all other medications as before, and refills have been done if requested.  Please have the pharmacy call with any other refills you may need.  Please continue your efforts at being more active, low cholesterol diet, and weight control.  You are otherwise up to date with prevention measures today.  Please keep your appointments with your specialists as you may have planned  Please go to the LAB in the Basement (turn left off the elevator) for the tests to be done today  You will be contacted by phone if any changes need to be made immediately.  Otherwise, you will receive a letter about your results with an explanation, but please check with MyChart first.  Please remember to sign up for MyChart if you have not done so, as this will be important to you in the future with finding out test results, communicating by private email, and scheduling acute appointments online when needed.  Please return in 6 months, or sooner if needed
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
## 2018-05-30 NOTE — Assessment & Plan Note (Signed)
Ok for viagra prn 

## 2018-05-30 NOTE — Assessment & Plan Note (Signed)
Has neurology f/u later today

## 2018-05-30 NOTE — Assessment & Plan Note (Signed)
stable overall by history and exam, recent data reviewed with pt, and pt to continue medical treatment as before,  to f/u any worsening symptoms or concerns  

## 2018-05-30 NOTE — Assessment & Plan Note (Signed)

## 2018-05-30 NOTE — Progress Notes (Signed)
NEUROLOGY FOLLOW UP OFFICE NOTE  Alexander [name] [id number]  DOB: Sep 20, 1932  HISTORY OF PRESENT ILLNESS: I had the pleasure of seeing Alexander [name] in follow-up in the neurology clinic on 05/30/2018.  The patient was last seen a year ago for mild to moderate dementia. MMSE in February 2019 was 15/29 (patient illiterate - 16/28 in February 2018, 18/28 in August 2017, 19/28 in March 2016). He is again accompanied by his wife who helps supplement the history today. On his initial visit, his daughter had called to report he got lost driving and was forgetting a lot of things. He had side effects on Donepezil and has been taking Rivastigmine, instructed to increase dose to 1.5mg  BID on last visit. His wife administers medications. They both report that after taking the Rivastigmine, he would feel dizzy for an hour. This usually goes away and he can function fine after. He remains active in the house doing yard work. He was also having stomach issues and has famotidine and pantoprazole which helps. His wife reports she does majority of the driving. She manages finances as well. She reports he repeats himself. She denies any paranoia or hallucinations, and seems to be hesitant to speak about his mood. He states he does not think he has anxiety. When asked about irritability, she states it has not changed, he keeps to himself a lot. He was given a prescription for citalopram this morning by Dr. Jonny Ruiz for pickup today. Sleep is good, no wandering behavior. He is able to bathe and dress independently. He denies any significant headaches, vision changes, focal numbness/tingling/weakness, no falls.   HPI: This is a pleasant 85 yo RH man with a history of hypertension, hyperlipidemia,with worsening memory loss. When asked about his memory, he states "there ain't none." He noticed memory changes in his late 48s, he would forget names, however recently he would be talking about something and forget what he was  talking about. He misplaces things frequently. He got lost driving one time. He was picking his wife up one time and did not know where he was (15 years ago). He occasionally forgets to take his medications and has to be told repeatedly to take it. He has noticed word-finding difficulties. No problems performing ADLs independently. He lives with his wife.  His daughter has noticed changes more in the past year or so. She would tell him something then the next minute he forgets. He asks the same questions repeatedly. At the end of the visit, his daughter walked out to speak with me personally, and stated that her father underreported symptoms, and that she and her mother have been concerned about his driving. She did not want to mention this in front of him because he would get upset. She denies any paranoia. He states he sees things moving around especially at night, like shadows, sometimes he thinks something is crawling around on the fence. He has constipation.  His father had memory loss.   I personally reviewed MRI brain done in 01/2014 which showed prominent mineral deposition in the globus pallidus, midbrain, and dentate nucleus. This may be normal but has been described in Parkinson's like syndromes. Moderate chronic microvascular change.  PAST MEDICAL HISTORY: Past Medical History:  Diagnosis Date  . Asthma   . Constipation   . COPD (chronic obstructive pulmonary disease) (HCC)    chronic dyspnea  . GERD (gastroesophageal reflux disease)   . Hemorrhoids   . HYPERLIPIDEMIA   . Hypertension   . Hypertensive  heart disease   . LVH (left ventricular hypertrophy)    a. 2014 Echo: EF 65-70%, no rwma, Gr1 DD, mild MR.  . Non-obstructive CAD (coronary artery disease)    a. 02/2009 Cath: essentially nl cors; b. 07/2014 MV: mild diaph atten, no ischemia-->low risk.    MEDICATIONS: Current Outpatient Medications on File Prior to Visit  Medication Sig Dispense Refill  . albuterol (PROAIR HFA) 108  (90 Base) MCG/ACT inhaler 2 puffs every 4 hours as needed only  if your can't catch your breath 1 Inhaler 5  . albuterol (PROVENTIL) (2.5 MG/3ML) 0.083% nebulizer solution Take 3 mLs (2.5 mg total) by nebulization every 6 (six) hours as needed for wheezing or shortness of breath. 75 mL 0  . bisacodyl (DULCOLAX) 5 MG EC tablet Take 1 tablet (5 mg total) by mouth 2 (two) times daily. 14 tablet 0  . budesonide-formoterol (SYMBICORT) 80-4.5 MCG/ACT inhaler Take 2 puffs first thing in am and then another 2 puffs about 12 hours later. 1 Inhaler 11  . cetirizine (ZYRTEC) 10 MG tablet Take 1 tablet (10 mg total) by mouth daily. 30 tablet 11  . hydrochlorothiazide (MICROZIDE) 12.5 MG capsule TAKE 1 CAPSULE (12.5 MG TOTAL) BY MOUTH DAILY. 45 capsule 2  . lactulose (CHRONULAC) 10 GM/15ML solution TAKE 45 MLS (30 G TOTAL) BY MOUTH 2 (TWO) TIMES DAILY AS NEEDED FOR MILD CONSTIPATION. 240 mL 5  . pantoprazole (PROTONIX) 40 MG tablet Take 1 tablet (40 mg total) by mouth daily as needed (for heartburn). (Patient taking differently: Take 40 mg by mouth daily before breakfast. ) 15 tablet 0  . rivastigmine (EXELON) 1.5 MG capsule Take 1 capsule (1.5 mg total) by mouth 2 (two) times daily. 180 capsule 3  . triamcinolone (NASACORT) 55 MCG/ACT AERO nasal inhaler Place 2 sprays into the nose daily. 1 Inhaler 12   No current facility-administered medications on file prior to visit.     ALLERGIES: No Known Allergies  FAMILY HISTORY: Family History  Problem Relation Age of Onset  . Pancreatic cancer Mother [id number]  . Hypertension Mother   . Aneurysm Father 74       brain  . Prostate cancer Brother        x 2 bro    SOCIAL HISTORY: Social History   Socioeconomic History  . Marital status: Married    Spouse name: Not on file  . Number of children: 8  . Years of education: 8  . Highest education level: Not on file  Occupational History  . Occupation: RETIRED    Employer: RETIRED    Comment: MACHINE OPERATOR    Social Needs  . Financial resource strain: Not hard at all  . Food insecurity:    Worry: Never true    Inability: Never true  . Transportation needs:    Medical: No    Non-medical: No  Tobacco Use  . Smoking status: Former Smoker    Packs/day: 0.25    Years: 48.00    Pack years: 12.00    Types: Cigarettes    Last attempt to quit: 04/24/2000    Years since quitting: 18.1  . Smokeless tobacco: Never Used  Substance and Sexual Activity  . Alcohol use: Not Currently  . Drug use: No  . Sexual activity: Yes  Lifestyle  . Physical activity:    Days per week: 3 days    Minutes per session: 50 min  . Stress: Not at all  Relationships  . Social connections:    Talks  on phone: More than three times a week    Gets together: More than three times a week    Attends religious service: More than 4 times per year    Active member of club or organization: Yes    Attends meetings of clubs or organizations: More than 4 times per year    Relationship status: Married  . Intimate partner violence:    Fear of current or ex partner: No    Emotionally abused: No    Physically abused: No    Forced sexual activity: No  Other Topics Concern  . Not on file  Social History Narrative   EXERCISES INTERMITTENTLY AT THE GYM      5 GRANDCHILDREN      2 GREAT-CHILDREN            WEARS GLASSES      Lives in one story home with wife.    REVIEW OF SYSTEMS: Constitutional: No fevers, chills, or sweats, no generalized fatigue, change in appetite Eyes: No visual changes, double vision, eye pain Ear, nose and throat: No hearing loss, ear pain, nasal congestion, sore throat Cardiovascular: No chest pain, palpitations Respiratory:  No shortness of breath at rest or with exertion, wheezes GastrointestinaI: No nausea, vomiting, diarrhea, abdominal pain, fecal incontinence Genitourinary:  No dysuria, urinary retention or frequency Musculoskeletal:  No neck pain, back pain Integumentary: No rash,  pruritus, skin lesions Neurological: as above Psychiatric: No depression, insomnia, anxiety Endocrine: No palpitations, fatigue, diaphoresis, mood swings, change in appetite, change in weight, increased thirst Hematologic/Lymphatic:  No anemia, purpura, petechiae. Allergic/Immunologic: no itchy/runny eyes, nasal congestion, recent allergic reactions, rashes  PHYSICAL EXAM: Vitals:   05/30/18 1023  BP: 104/62  Pulse: 71  SpO2: 92%   General: No acute distress Head:  Normocephalic/atraumatic Neck: supple, no paraspinal tenderness, full range of motion Heart:  Regular rate and rhythm Lungs:  Clear to auscultation bilaterally Back: No paraspinal tenderness Skin/Extremities: No rash, no edema Neurological Exam: alert and oriented to person, city/state. No aphasia or dysarthria. Fund of knowledge is reduced.  Recent and remote memory are impaired.  Attention and concentration are reduced. He is illiterate and has difficulty spelling/reading.  Able to name objects, difficulty repeating. MMSE - Mini Mental State Exam 05/30/2018 09/07/2017 05/31/2017  Not completed: - (No Data) -  Orientation to time 0 - 0  Orientation to Place 3 - 4  Registration 3 - 3  Attention/ Calculation 3 - 1  Recall 0 - 0  Language- name 2 objects 2 - 2  Language- repeat 0 - 1  Language- follow 3 step command 3 - 3  Language- read & follow direction 0 - 0  Write a sentence 0 - 0  Copy design 0 - 1  Total score 14 - 15   Cranial nerves: Pupils equal, round, reactive to light.Extraocular movements intact with no nystagmus. Visual fields full. Facial sensation intact. No facial asymmetry. Tongue, uvula, palate midline.  Motor: Bulk and tone normal, muscle strength 5/5 throughout with no pronator drift.  Sensation to light touch intact.  No extinction to double simultaneous stimulation.  Deep tendon reflexes 2+ throughout, toes downgoing.  Finger to nose testing intact.  Gait narrow-based and steady, able to tandem walk  adequately.  Romberg negative.  IMPRESSION: This is an 85 yo RH man with vascular risk factors including hypertension, hyperlipidemia, and dementia. MMSE today 14/28 (patient illiterate). There has been gradual progression over the years, consistent with moderate dementia, possibly vascular etiology. MRI  brain showed moderate chronic microvascular disease. They are concerned about dizziness/GI symptoms from Rivastigmine, we discussed holding medication for a week and monitoring if symptoms resolve off medication. He was given a prescription for Memantine 10mg  BID to start afterward, side effects discussed. It seems his wife is hesitant to speak about mood changes, he has been started on citalopram by Dr. Jonny RuizJohn. We discussed mood issues that can occur with dementia. No further driving. Continue close supervision. We again discussed the importance of control of vascular risk factors, physical exercise, and brain stimulation exercises for brain health. He will follow-up in 6 months and knows to call for any problems with the medication.  Thank you for allowing me to participate in his care.  Please do not hesitate to call for any questions or concerns.  The duration of this appointment visit was 30 minutes of face-to-face time with the patient.  Greater than 50% of this time was spent in counseling, explanation of diagnosis, planning of further management, and coordination of care.   Patrcia DollyKaren Fowler Antos, M.D.   CC: Dr. Oliver BarreJames John

## 2018-06-03 ENCOUNTER — Other Ambulatory Visit: Payer: Self-pay | Admitting: Internal Medicine

## 2018-06-07 ENCOUNTER — Telehealth: Payer: Self-pay | Admitting: Neurology

## 2018-06-07 ENCOUNTER — Other Ambulatory Visit: Payer: Self-pay | Admitting: Neurology

## 2018-06-07 NOTE — Telephone Encounter (Signed)
Patient wife called and wants to speak to someone about patient medication. She state that he was to start a new medication and he has been throwing up and was not sure to start the medication or wait  Please call

## 2018-06-10 ENCOUNTER — Other Ambulatory Visit: Payer: Self-pay | Admitting: Internal Medicine

## 2018-06-11 NOTE — Telephone Encounter (Signed)
Spoke with pt's wife.  She states that pt was sick last week and did not start the Memantine due to N/V.  States that vomiting stopped over the weekend.  [name] to have pt start Memantine BID today.    Asked how pt's dizziness has been since stopping Rivastigmine.  Alexander [name] states that he has done "wonderful" since stopping, with the exception of contracting a stomach virus he has been "just great".

## 2018-06-14 ENCOUNTER — Telehealth: Payer: Self-pay | Admitting: Neurology

## 2018-06-14 NOTE — Telephone Encounter (Signed)
[name] called and is needing to speak with someone today regarding his medication Memantine 10 MG.  He has been sleeping all day and has had a headache. She is needing to know should she take 1/2 or give him the whole? Please Call. Thanks

## 2018-06-17 NOTE — Telephone Encounter (Signed)
Spoke with pt's wife, [name], who states that pt has had N/V since starting Memantine.  Also states that pt's "bowels have stopped" and that pt is sleeping all the time.  States this is not his normal routine. [name] states that pt has not had Memantine since Friday.  [name] to give pt half tab daily x3days, half tab BID x3days, half tab AM full tab PM x3days, then in crease to 1 full tab BID.  Asked that she call office either end-of-week or beginning of next week with update.  If no improvement we will d/c medication (verbal per Dr. Karel Jarvis)

## 2018-07-19 ENCOUNTER — Other Ambulatory Visit: Payer: Self-pay | Admitting: Internal Medicine

## 2018-08-21 ENCOUNTER — Telehealth: Payer: Self-pay | Admitting: Internal Medicine

## 2018-08-21 NOTE — Telephone Encounter (Signed)
Contacted wife and reviewed Dr. Thurston Hole recommendations.  Patient is using the nebulizer now and wife will continue to have patient use it as directed by Dr. Sherene Sires for the shortness of breath. Wife also states sometimes the patient refuses some of his meds but will stress the importance of increasing the protonix twice daily.  Explains patient will sometimes take famotidine instead of protonix but she will make sure he is taking one of these twice daily during this period of SOB.    Also discussed new protocols for patients who may have potential exposure or risk of exposure to Covid19 and why Dr. Sherene Sires recommends if SOB is not improving that patient would need to report to ED for further treatment.  Wife acknowledged understanding and states she will take him to ED if symptoms worsen or do not improve.  Nothing further needed at this time.  Will close encounter.

## 2018-08-21 NOTE — Telephone Encounter (Signed)
Nebulizer is the right thing to do and continue other meds as they are but increase the protonix to Take 30- 60 min before your first and last meals of the day while having breathing problems  If can't get comfortable with neb up to q 4 h need to go to ER and explain they were exposed to [facility] residents as the virus is quite active there even in people with no symptoms

## 2018-08-21 NOTE — Telephone Encounter (Signed)
Returned call to wife who is with patient (patient has mild dementia). Patient reports increased SOB began late yesterday and continues today. No increased cough, no fever or any other symptoms except more SOB with activity.  Wife states just walking to bathroom makes him 'more winded' than usual.  Usual cough produces white phelgm.  Wife states she hopes to have 'something called in to head this off' before symptoms increase.  She states patient has been feeling very well, much better than usual in recent months and has even been mowing grass. Patient is taking all his usual meds as prescribed: using symbicort and has used albuterol once or twice today and wife states she will try albuterol nebulizer now, as she has not tried this.     Wife states they did attend a relatives funeral on Monday 08/20/18 in [place] and had out of town family and children from [place].  No one was known to be sick or exposed to Covid19.  The funeral gathering was about 30 people.    LOV 03/15/18  Plan A =  Symbicort 80 Take 2 puffs first thing in am and then another 2 puffs about 12 hours later.   Plan B = Back up >>>Only use your albuterol nebulizer as a rescue medication to be used if you can't catch your breath by resting or doing a relaxed purse lip breathing pattern.  - The less you use it, the better it will work when you need it. - Ok to use up to  every 4 hours if you must but call for immediate appointment if use goes up over your usual need  Please schedule a follow up visit in 6  months but call sooner if needed   Next Scheduled OV 11/04/2018 with Dr. Sherene Sires  Will route to Dr. Sherene Sires for review/recommendations.  ([facility] [address] if prescriptions) Dr. Sherene Sires please advise.  Thank you   Primary Pulmonologist: Wert Last office visit and with whom: 03/15/18 Wert What do we see them for (pulmonary problems): COPD  Reason for call: increased SOB late yesterday and today 08/21/18  In the last month, have you been  in contact with someone who was confirmed or suspected to have Conoravirus / COVID-19?  No   Do you have any of the following symptoms developed in the last 30 days? Fever: No Cough: no increase in usual cough Shortness of breath: increased cough past 2 days  When did your symptoms start?  Late yesterday 08/20/18  If the patient has a fever, what is the last reading?  (use n/a if patient denies fever)  NA . IF THE PATIENT STATES THEY DO NOT OWN A THERMOMETER, THEY MUST GO AND PURCHASE ONE When did the fever start?: NA Have you taken any medication to suppress a fever (ie Ibuprofen, Aleve, Tylenol)?: No

## 2018-08-24 ENCOUNTER — Other Ambulatory Visit: Payer: Self-pay | Admitting: Internal Medicine

## 2018-09-02 ENCOUNTER — Other Ambulatory Visit: Payer: Self-pay

## 2018-09-02 ENCOUNTER — Encounter (HOSPITAL_COMMUNITY): Payer: Self-pay | Admitting: Emergency Medicine

## 2018-09-02 ENCOUNTER — Telehealth: Payer: Self-pay | Admitting: Internal Medicine

## 2018-09-02 ENCOUNTER — Inpatient Hospital Stay (HOSPITAL_COMMUNITY)
Admission: EM | Admit: 2018-09-02 | Discharge: 2018-09-03 | DRG: 190 | Disposition: A | Discharge status: Home / Self Care | Payer: PPO | Attending: Family Medicine | Admitting: Family Medicine

## 2018-09-02 ENCOUNTER — Emergency Department (HOSPITAL_COMMUNITY): Payer: PPO

## 2018-09-02 DIAGNOSIS — Z87891 Personal history of nicotine dependence: Secondary | ICD-10-CM

## 2018-09-02 DIAGNOSIS — K219 Gastro-esophageal reflux disease without esophagitis: Secondary | ICD-10-CM | POA: Diagnosis not present

## 2018-09-02 DIAGNOSIS — I110 Hypertensive heart disease with heart failure: Secondary | ICD-10-CM | POA: Diagnosis not present

## 2018-09-02 DIAGNOSIS — J449 Chronic obstructive pulmonary disease, unspecified: Secondary | ICD-10-CM | POA: Diagnosis present

## 2018-09-02 DIAGNOSIS — F0390 Unspecified dementia without behavioral disturbance: Secondary | ICD-10-CM | POA: Diagnosis present

## 2018-09-02 DIAGNOSIS — I5042 Chronic combined systolic (congestive) and diastolic (congestive) heart failure: Secondary | ICD-10-CM | POA: Diagnosis not present

## 2018-09-02 DIAGNOSIS — Z8249 Family history of ischemic heart disease and other diseases of the circulatory system: Secondary | ICD-10-CM

## 2018-09-02 DIAGNOSIS — J9601 Acute respiratory failure with hypoxia: Secondary | ICD-10-CM | POA: Diagnosis not present

## 2018-09-02 DIAGNOSIS — R000 Tachycardia, unspecified: Secondary | ICD-10-CM | POA: Diagnosis not present

## 2018-09-02 DIAGNOSIS — J441 Chronic obstructive pulmonary disease with (acute) exacerbation: Principal | ICD-10-CM | POA: Diagnosis present

## 2018-09-02 DIAGNOSIS — Z20828 Contact with and (suspected) exposure to other viral communicable diseases: Secondary | ICD-10-CM | POA: Diagnosis present

## 2018-09-02 DIAGNOSIS — I10 Essential (primary) hypertension: Secondary | ICD-10-CM | POA: Diagnosis not present

## 2018-09-02 DIAGNOSIS — R0602 Shortness of breath: Secondary | ICD-10-CM | POA: Diagnosis not present

## 2018-09-02 DIAGNOSIS — Z7951 Long term (current) use of inhaled steroids: Secondary | ICD-10-CM

## 2018-09-02 DIAGNOSIS — I2510 Atherosclerotic heart disease of native coronary artery without angina pectoris: Secondary | ICD-10-CM | POA: Diagnosis not present

## 2018-09-02 DIAGNOSIS — Z79899 Other long term (current) drug therapy: Secondary | ICD-10-CM

## 2018-09-02 DIAGNOSIS — E785 Hyperlipidemia, unspecified: Secondary | ICD-10-CM | POA: Diagnosis not present

## 2018-09-02 DIAGNOSIS — R05 Cough: Secondary | ICD-10-CM | POA: Diagnosis not present

## 2018-09-02 LAB — CBC WITH DIFFERENTIAL/PLATELET
Abs Immature Granulocytes: 0.01 10*3/uL (ref 0.00–0.07)
Abs Immature Granulocytes: 0.01 10*3/uL (ref 0.00–0.07)
Basophils Absolute: 0.1 10*3/uL (ref 0.0–0.1)
Basophils Absolute: 0 10*3/uL (ref 0.0–0.1)
Basophils Relative: 1 %
Basophils Relative: 1 %
Eosinophils Absolute: 0.2 10*3/uL (ref 0.0–0.5)
Eosinophils Absolute: 0 10*3/uL (ref 0.0–0.5)
Eosinophils Relative: 4 %
Eosinophils Relative: 0 %
HCT: 43.4 % (ref 39.0–52.0)
HCT: 42.8 % (ref 39.0–52.0)
Hemoglobin: 13.5 g/dL (ref 13.0–17.0)
Hemoglobin: 13.6 g/dL (ref 13.0–17.0)
Immature Granulocytes: 0 %
Immature Granulocytes: 0 %
Lymphocytes Relative: 26 %
Lymphocytes Relative: 14 %
Lymphs Abs: 1.4 10*3/uL (ref 0.7–4.0)
Lymphs Abs: 0.6 10*3/uL — ABNORMAL LOW (ref 0.7–4.0)
MCH: 30.4 pg (ref 26.0–34.0)
MCH: 31.5 pg (ref 26.0–34.0)
MCHC: 31.1 g/dL (ref 30.0–36.0)
MCHC: 31.8 g/dL (ref 30.0–36.0)
MCV: 97.7 fL (ref 80.0–100.0)
MCV: 99.1 fL (ref 80.0–100.0)
Monocytes Absolute: 0.6 10*3/uL (ref 0.1–1.0)
Monocytes Absolute: 0 10*3/uL — ABNORMAL LOW (ref 0.1–1.0)
Monocytes Relative: 12 %
Monocytes Relative: 1 %
Neutro Abs: 2.9 10*3/uL (ref 1.7–7.7)
Neutro Abs: 3.7 10*3/uL (ref 1.7–7.7)
Neutrophils Relative %: 57 %
Neutrophils Relative %: 84 %
Platelets: 292 10*3/uL (ref 150–400)
Platelets: 290 10*3/uL (ref 150–400)
RBC: 4.44 MIL/uL (ref 4.22–5.81)
RBC: 4.32 MIL/uL (ref 4.22–5.81)
RDW: 12.5 % (ref 11.5–15.5)
RDW: 12.5 % (ref 11.5–15.5)
WBC: 5.2 10*3/uL (ref 4.0–10.5)
WBC: 4.4 10*3/uL (ref 4.0–10.5)
nRBC: 0 % (ref 0.0–0.2)
nRBC: 0 % (ref 0.0–0.2)

## 2018-09-02 LAB — BASIC METABOLIC PANEL
Anion gap: 9 (ref 5–15)
BUN: 12 mg/dL (ref 8–23)
CO2: 29 mmol/L (ref 22–32)
Calcium: 9.4 mg/dL (ref 8.9–10.3)
Chloride: 106 mmol/L (ref 98–111)
Creatinine, Ser: 0.89 mg/dL (ref 0.61–1.24)
GFR calc Af Amer: >60 mL/min (ref >60)
GFR calc non Af Amer: >60 mL/min (ref >60)
Glucose, Bld: 87 mg/dL (ref 70–99)
Potassium: 4 mmol/L (ref 3.5–5.1)
Sodium: 144 mmol/L (ref 135–145)

## 2018-09-02 LAB — TROPONIN I: Troponin I: <0.03 ng/mL (ref <0.03)

## 2018-09-02 LAB — BLOOD GAS, VENOUS
Acid-Base Excess: 1.4 mmol/L (ref 0.0–2.0)
Bicarbonate: 26.2 mmol/L (ref 20.0–28.0)
Drawn by: 55256
O2 Saturation: 94.7 %
Patient temperature: 98.6
pCO2, Ven: 44.5 mmHg (ref 44.0–60.0)
pH, Ven: 7.388 (ref 7.250–7.430)
pO2, Ven: 76.6 mmHg — ABNORMAL HIGH (ref 32.0–45.0)

## 2018-09-02 LAB — SARS CORONAVIRUS 2 BY RT PCR (HOSPITAL ORDER, PERFORMED IN ~~LOC~~ HOSPITAL LAB): SARS Coronavirus 2: NEGATIVE

## 2018-09-02 LAB — MAGNESIUM: Magnesium: 2.6 mg/dL — ABNORMAL HIGH (ref 1.7–2.4)

## 2018-09-02 MED ORDER — MOMETASONE FURO-FORMOTEROL FUM 200-5 MCG/ACT IN AERO
1.0000 | INHALATION_SPRAY | Freq: Two times a day (BID) | RESPIRATORY_TRACT | Status: DC
  Administered 2018-09-02: 1 via RESPIRATORY_TRACT
  Filled 2018-09-02: qty 8.8

## 2018-09-02 MED ORDER — ALBUTEROL SULFATE (2.5 MG/3ML) 0.083% IN NEBU: 2.5000 mg | INHALATION_SOLUTION | Freq: Four times a day (QID) | RESPIRATORY_TRACT | Status: DC | PRN

## 2018-09-02 MED ORDER — IPRATROPIUM-ALBUTEROL 0.5-2.5 (3) MG/3ML IN SOLN
3.0000 mL | Freq: Four times a day (QID) | RESPIRATORY_TRACT | Status: DC
  Administered 2018-09-02: 3 mL via RESPIRATORY_TRACT
  Filled 2018-09-02: qty 3

## 2018-09-02 MED ORDER — METHYLPREDNISOLONE SODIUM SUCC 125 MG IJ SOLR
60.0000 mg | Freq: Two times a day (BID) | INTRAMUSCULAR | Status: DC
  Administered 2018-09-03: 60 mg via INTRAVENOUS
  Filled 2018-09-02: qty 2

## 2018-09-02 MED ORDER — SODIUM CHLORIDE 0.9% FLUSH
3.0000 mL | Freq: Two times a day (BID) | INTRAVENOUS | Status: DC
  Administered 2018-09-02: 3 mL via INTRAVENOUS

## 2018-09-02 MED ORDER — ALBUTEROL SULFATE HFA 108 (90 BASE) MCG/ACT IN AERS
6.0000 | INHALATION_SPRAY | Freq: Once | RESPIRATORY_TRACT | Status: AC
  Administered 2018-09-02: 6 via RESPIRATORY_TRACT
  Filled 2018-09-02: qty 6.7

## 2018-09-02 MED ORDER — ALBUTEROL SULFATE HFA 108 (90 BASE) MCG/ACT IN AERS
2.0000 | INHALATION_SPRAY | Freq: Once | RESPIRATORY_TRACT | Status: AC
  Administered 2018-09-02: 2 via RESPIRATORY_TRACT

## 2018-09-02 MED ORDER — SODIUM CHLORIDE 0.9 % IV SOLN
INTRAVENOUS | Status: DC | PRN
  Administered 2018-09-02: 500 mL via INTRAVENOUS

## 2018-09-02 MED ORDER — AZITHROMYCIN 250 MG PO TABS: 500.0000 mg | ORAL_TABLET | Freq: Every day | ORAL | Status: DC

## 2018-09-02 MED ORDER — MAGNESIUM SULFATE 2 GM/50ML IV SOLN
2.0000 g | Freq: Once | INTRAVENOUS | Status: AC
  Administered 2018-09-02: 2 g via INTRAVENOUS
  Filled 2018-09-02: qty 50

## 2018-09-02 MED ORDER — ALBUTEROL SULFATE HFA 108 (90 BASE) MCG/ACT IN AERS: 2.0000 | INHALATION_SPRAY | RESPIRATORY_TRACT | Status: DC | PRN

## 2018-09-02 MED ORDER — METHYLPREDNISOLONE SODIUM SUCC 125 MG IJ SOLR
125.0000 mg | Freq: Once | INTRAMUSCULAR | Status: AC
  Administered 2018-09-02: 125 mg via INTRAVENOUS
  Filled 2018-09-02: qty 2

## 2018-09-02 MED ORDER — IPRATROPIUM-ALBUTEROL 0.5-2.5 (3) MG/3ML IN SOLN
3.0000 mL | Freq: Three times a day (TID) | RESPIRATORY_TRACT | Status: DC
  Administered 2018-09-03: 3 mL via RESPIRATORY_TRACT
  Filled 2018-09-02: qty 3

## 2018-09-02 MED ORDER — SODIUM CHLORIDE 0.9 % IV BOLUS
500.0000 mL | Freq: Once | INTRAVENOUS | Status: AC
  Administered 2018-09-02: 500 mL via INTRAVENOUS

## 2018-09-02 MED ORDER — LEVALBUTEROL HCL 0.63 MG/3ML IN NEBU: 0.6300 mg | INHALATION_SOLUTION | Freq: Four times a day (QID) | RESPIRATORY_TRACT | Status: DC | PRN

## 2018-09-02 MED ORDER — PREDNISONE 20 MG PO TABS: 40.0000 mg | ORAL_TABLET | Freq: Every day | ORAL | Status: DC

## 2018-09-02 MED ORDER — SODIUM CHLORIDE 0.9 % IV SOLN
500.0000 mg | INTRAVENOUS | Status: AC
  Administered 2018-09-02: 500 mg via INTRAVENOUS
  Filled 2018-09-02: qty 500

## 2018-09-02 NOTE — Telephone Encounter (Signed)
Called and spoke with pt's wife [name] who stated pt has had increased SOB x5 days, has a cough with gray-green mucus, and also has increased fatigue.  Pt is taking symbicort daily as prescribed and is using nebulizer twice daily.  Pt denies any fever, body aches, or chills. Pt has not done any recent travelling and has not been around anyone that has been sick.  Pt and wife [name] would like to know recommendations to help with pt's symptoms. Dr. Sherene Sires, please advise on this. Thanks!

## 2018-09-02 NOTE — Telephone Encounter (Signed)
Spoke with patient's wife Alexander [name]. She stated that they went over to [facility] and someone there advised them that they are not doing the drive up testing. Asked Alexander [name] if she had actually went to ED, she stated that she did not as this information was explained to them during the previous call. She stated that she will take the patient back to [facility] and have him go through the ED.   I apologize to patient and his wife for the miscommunication.   Nothing further needed at time of call.
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
## 2018-09-02 NOTE — ED Notes (Signed)
Bed: WTR7 Expected date:  Expected time:  Means of arrival:  Comments: 

## 2018-09-02 NOTE — Telephone Encounter (Signed)
Unfortunately needs covid testing before we can see for this problem in office so suggest they go to [facility] and f/u here if neg but bring all active meds with them when they do
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
## 2018-09-02 NOTE — ED Triage Notes (Signed)
Pt c/o SOB for year that gotten worse in past couple weeks.

## 2018-09-02 NOTE — ED Notes (Signed)
Called pt spouse [name]. Gave her an update on pt new room number and current condition.

## 2018-09-02 NOTE — ED Notes (Signed)
Pt ambulated 30 ft w/o assist.   Pt's O2 stayed about 94%, RR were at 26, and HR maintained at 107.  Pt stated that he felt short of breath.    Upon getting in to bed Pt's O2 dropped to 85 and remained at 90 for a few mintues.   PT is at 99% O2.

## 2018-09-02 NOTE — Telephone Encounter (Signed)
Called and spoke with pt's wife [name] letting her know the info stated by MW that pt needs to go to [facility] to be tested for COVID due to his symptoms. Stated to her after the test has been performed, if pt has a negative result, then we could get him into office to further evaluate. [name] expressed understanding. Nothing further needed.
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
## 2018-09-03 LAB — CBC WITH DIFFERENTIAL/PLATELET
Abs Immature Granulocytes: 0.02 10*3/uL (ref 0.00–0.07)
Basophils Absolute: 0 10*3/uL (ref 0.0–0.1)
Basophils Relative: 0 %
Eosinophils Absolute: 0 10*3/uL (ref 0.0–0.5)
Eosinophils Relative: 0 %
HCT: 40.3 % (ref 39.0–52.0)
Hemoglobin: 13.1 g/dL (ref 13.0–17.0)
Immature Granulocytes: 0 %
Lymphocytes Relative: 12 %
Lymphs Abs: 0.7 10*3/uL (ref 0.7–4.0)
MCH: 31.3 pg (ref 26.0–34.0)
MCHC: 32.5 g/dL (ref 30.0–36.0)
MCV: 96.2 fL (ref 80.0–100.0)
Monocytes Absolute: 0.1 10*3/uL (ref 0.1–1.0)
Monocytes Relative: 2 %
Neutro Abs: 5.1 10*3/uL (ref 1.7–7.7)
Neutrophils Relative %: 86 %
Platelets: 290 10*3/uL (ref 150–400)
RBC: 4.19 MIL/uL — ABNORMAL LOW (ref 4.22–5.81)
RDW: 12.3 % (ref 11.5–15.5)
WBC: 6 10*3/uL (ref 4.0–10.5)
nRBC: 0 % (ref 0.0–0.2)

## 2018-09-03 LAB — RESPIRATORY PANEL BY PCR

## 2018-09-03 LAB — COMPREHENSIVE METABOLIC PANEL
ALT: 16 U/L (ref 0–44)
AST: 21 U/L (ref 15–41)
Albumin: 4.1 g/dL (ref 3.5–5.0)
Alkaline Phosphatase: 58 U/L (ref 38–126)
Anion gap: 9 (ref 5–15)
BUN: 14 mg/dL (ref 8–23)
CO2: 25 mmol/L (ref 22–32)
Calcium: 9.2 mg/dL (ref 8.9–10.3)
Chloride: 107 mmol/L (ref 98–111)
Creatinine, Ser: 0.84 mg/dL (ref 0.61–1.24)
GFR calc Af Amer: >60 mL/min (ref >60)
GFR calc non Af Amer: >60 mL/min (ref >60)
Glucose, Bld: 129 mg/dL — ABNORMAL HIGH (ref 70–99)
Potassium: 4.1 mmol/L (ref 3.5–5.1)
Sodium: 141 mmol/L (ref 135–145)
Total Bilirubin: 1.1 mg/dL (ref 0.3–1.2)
Total Protein: 6.7 g/dL (ref 6.5–8.1)

## 2018-09-03 LAB — URINALYSIS, ROUTINE W REFLEX MICROSCOPIC
Bacteria, UA: NONE SEEN
Bilirubin Urine: NEGATIVE
Glucose, UA: NEGATIVE mg/dL
Hgb urine dipstick: NEGATIVE
Ketones, ur: 5 mg/dL — AB
Leukocytes,Ua: NEGATIVE
Nitrite: NEGATIVE
Protein, ur: 30 mg/dL — AB
Specific Gravity, Urine: 1.028 (ref 1.005–1.030)
pH: 5 (ref 5.0–8.0)

## 2018-09-03 LAB — PHOSPHORUS: Phosphorus: 3.8 mg/dL (ref 2.5–4.6)

## 2018-09-03 LAB — TSH: TSH: 0.263 u[IU]/mL — ABNORMAL LOW (ref 0.350–4.500)

## 2018-09-03 LAB — MAGNESIUM: Magnesium: 2.3 mg/dL (ref 1.7–2.4)

## 2018-09-03 MED ORDER — AZITHROMYCIN 250 MG PO TABS: ORAL_TABLET | ORAL | 0 refills | Status: DC

## 2018-09-03 MED ORDER — BISACODYL 5 MG PO TBEC
5.0000 mg | DELAYED_RELEASE_TABLET | Freq: Two times a day (BID) | ORAL | Status: DC
  Administered 2018-09-03: 5 mg via ORAL
  Filled 2018-09-03: qty 1

## 2018-09-03 MED ORDER — ENOXAPARIN SODIUM 40 MG/0.4ML ~~LOC~~ SOLN: 40.0000 mg | Freq: Every day | SUBCUTANEOUS | Status: DC

## 2018-09-03 MED ORDER — PANTOPRAZOLE SODIUM 40 MG PO TBEC
40.0000 mg | DELAYED_RELEASE_TABLET | Freq: Every day | ORAL | Status: DC
  Administered 2018-09-03: 40 mg via ORAL
  Filled 2018-09-03: qty 1

## 2018-09-03 MED ORDER — ACETAMINOPHEN 325 MG PO TABS: 650.0000 mg | ORAL_TABLET | Freq: Four times a day (QID) | ORAL | Status: DC | PRN

## 2018-09-03 MED ORDER — PREDNISONE 10 MG (21) PO TBPK: ORAL_TABLET | ORAL | 0 refills | Status: DC

## 2018-09-03 MED ORDER — ONDANSETRON HCL 4 MG/2ML IJ SOLN: 4.0000 mg | Freq: Four times a day (QID) | INTRAMUSCULAR | Status: DC | PRN

## 2018-09-03 MED ORDER — FAMOTIDINE 20 MG PO TABS
40.0000 mg | ORAL_TABLET | Freq: Every day | ORAL | Status: DC
  Administered 2018-09-03: 40 mg via ORAL
  Filled 2018-09-03: qty 2

## 2018-09-03 MED ORDER — HYDROCODONE-ACETAMINOPHEN 5-325 MG PO TABS: 1.0000 | ORAL_TABLET | ORAL | Status: DC | PRN

## 2018-09-03 MED ORDER — ACETAMINOPHEN 650 MG RE SUPP: 650.0000 mg | Freq: Four times a day (QID) | RECTAL | Status: DC | PRN

## 2018-09-03 MED ORDER — MEMANTINE HCL 10 MG PO TABS
5.0000 mg | ORAL_TABLET | Freq: Two times a day (BID) | ORAL | Status: DC
  Administered 2018-09-03: 5 mg via ORAL
  Filled 2018-09-03: qty 1

## 2018-09-03 MED ORDER — ONDANSETRON HCL 4 MG PO TABS: 4.0000 mg | ORAL_TABLET | Freq: Four times a day (QID) | ORAL | Status: DC | PRN

## 2018-09-03 MED ORDER — LACTULOSE 10 GM/15ML PO SOLN: 30.0000 g | Freq: Two times a day (BID) | ORAL | Status: DC | PRN

## 2018-09-03 MED ORDER — MOMETASONE FURO-FORMOTEROL FUM 100-5 MCG/ACT IN AERO: 2.0000 | INHALATION_SPRAY | Freq: Two times a day (BID) | RESPIRATORY_TRACT | Status: DC

## 2018-09-03 NOTE — Discharge Summary (Addendum)
Physician Discharge Summary  Alexander [name] UJW:[id number] DOB: [birth year] DOA: 09/02/2018  PCP: Corwin Levins, MD  Admit date: 09/02/2018 Discharge date: 09/03/2018  Time spent: 35 minutes  Recommendations for Outpatient Follow-up:  1. Complete azithromycin and prednisone as an outpatient 2. Does need x-ray and follow-up with Dr. Sherene Sires in the outpatient setting 3. Would recommend further screening and dementia management per Dr. Karel Jarvis patient's neurologist  Discharge Diagnoses:  Principal Problem:   COPD with acute exacerbation [facility]) Active Problems:   Hyperlipidemia   Essential hypertension   Dementia without behavioral disturbance (HCC)   Acute respiratory failure with hypoxia (HCC)   COPD exacerbation (HCC)   Discharge Condition: Improved  Diet recommendation: Heart healthy  Filed Weights   09/02/18 2210  Weight: 65 kg    History of present illness:  Briefly this is an 85 year old male who was admitted overnight 511 through 5/12 2020 with acute COPD exacerbation and flare He was sent to the emergency room by his pulmonologist Dr. Sherene Sires who felt there was risk for Covid19 based on the fact that he had been in [place] recently for funeral where there were 30 people present It was felt that at the time of admission he was wheezing and he was uncomfortable however he never dropped his sats He was adamant about leaving on the morning of 5/12-he had no wheeze his labs were unremarkable he had nothing on chest x-ray and it was felt that he could probably go home to complete prednisone and azithromycin In addition-because of his dementia with a prior MMSE of 15/30, as well as his insistence on going home-although it would have been reasonable to keep him in the hospital at least overnight, for his own safety and because of his mild agitation and insistence, it was felt he would be best served going home on the day of d/c  I spoke with his wife and let her know that he is stable  from my perspective and he should follow-up in the outpatient setting with pulmonology  Discharge Exam: Vitals:   09/03/18 0718 09/03/18 0840  BP:    Pulse:    Resp:    Temp:    SpO2: 98% 94%    General: Awake alert pleasant no distress Cardiovascular: S1-S2 no murmur Respiratory: Chest clear no rales no rhonchi no egophony no tactile vocal fremitus arrested Abdomen soft nontender no rebound no guarding neurologically intact  Discharge Instructions   Discharge Instructions    Diet - low sodium heart healthy   Complete by:  As directed    Discharge instructions   Complete by:  As directed    Finish steroids and azithromycin Check cxr in 1 week Follow up dr. Sherene Sires   Increase activity slowly   Complete by:  As directed      Allergies as of 09/03/2018   No Known Allergies     Medication List    STOP taking these medications   cetirizine 10 MG tablet Commonly known as:  ZYRTEC   triamcinolone cream 0.1 % Commonly known as:  KENALOG     TAKE these medications   albuterol 108 (90 Base) MCG/ACT inhaler Commonly known as:  ProAir HFA 2 puffs every 4 hours as needed only  if your can't catch your breath What changed:    how much to take  how to take this  when to take this  additional instructions   albuterol (2.5 MG/3ML) 0.083% nebulizer solution Commonly known as:  PROVENTIL TAKE 3 MLS  BY NEBULIZATION EVERY 6 (SIX) HOURS AS NEEDED FOR WHEEZING OR SHORTNESS OF BREATH. What changed:  See the new instructions.   azithromycin 250 MG tablet Commonly known as:  ZITHROMAX Complete course   bisacodyl 5 MG EC tablet Commonly known as:  DULCOLAX Take 1 tablet (5 mg total) by mouth 2 (two) times daily.   budesonide-formoterol 80-4.5 MCG/ACT inhaler Commonly known as:  Symbicort Take 2 puffs first thing in am and then another 2 puffs about 12 hours later.   citalopram 10 MG tablet Commonly known as:  CeleXA Take 1 tablet (10 mg total) by mouth daily.    famotidine 40 MG tablet Commonly known as:  PEPCID Take 40 mg by mouth at bedtime.   hydrochlorothiazide 12.5 MG capsule Commonly known as:  MICROZIDE TAKE 1 CAPSULE (12.5 MG TOTAL) BY MOUTH DAILY.   lactulose 10 GM/15ML solution Commonly known as:  CHRONULAC TAKE 45 MLS (30 G TOTAL) BY MOUTH 2 (TWO) TIMES DAILY AS NEEDED FOR MILD CONSTIPATION.   memantine 10 MG tablet Commonly known as:  NAMENDA Take 1 tablet (10 mg total) by mouth 2 (two) times daily. What changed:  how much to take   pantoprazole 40 MG tablet Commonly known as:  PROTONIX Take 1 tablet (40 mg total) by mouth daily as needed (for heartburn). What changed:  when to take this   predniSONE 10 MG (21) Tbpk tablet Commonly known as:  STERAPRED UNI-PAK 21 TAB Take 2 tablets for 2 days then 1 tablet  For 2 days then stop   sildenafil 100 MG tablet Commonly known as:  Viagra Take 0.5-1 tablets (50-100 mg total) by mouth daily as needed for erectile dysfunction.   triamcinolone 55 MCG/ACT Aero nasal inhaler Commonly known as:  NASACORT Place 2 sprays into the nose daily.      No Known Allergies    The results of significant diagnostics from this hospitalization (including imaging, microbiology, ancillary and laboratory) are listed below for reference.    Significant Diagnostic Studies: Dg Chest Portable 1 View  Result Date: 09/02/2018 CLINICAL DATA:  Shortness of breath, productive cough. EXAM: PORTABLE CHEST 1 VIEW COMPARISON:  Radiographs of January 04, 2018. FINDINGS: The heart size and mediastinal contours are within normal limits. Both lungs are clear. No pneumothorax or pleural effusion is noted. The visualized skeletal structures are unremarkable. IMPRESSION: No active disease. Electronically Signed   By: Lupita Raider M.D.   On: 09/02/2018 15:58    Microbiology: Recent Results (from the past 240 hour(s))  SARS Coronavirus 2 (CEPHEID- Performed in [facility] hospital lab), Hosp Order      Status: None   Collection Time: 09/02/18  4:01 PM  Result Value Ref Range Status   SARS Coronavirus 2 NEGATIVE NEGATIVE Final    Comment: (NOTE) If result is NEGATIVE SARS-CoV-2 target nucleic acids are NOT DETECTED. The SARS-CoV-2 RNA is generally detectable in upper and lower  respiratory specimens during the acute phase of infection. The lowest  concentration of SARS-CoV-2 viral copies this assay can detect is 250  copies / mL. A negative result does not preclude SARS-CoV-2 infection  and should not be used as the sole basis for treatment or other  patient management decisions.  A negative result may occur with  improper specimen collection / handling, submission of specimen other  than nasopharyngeal swab, presence of viral mutation(s) within the  areas targeted by this assay, and inadequate number of viral copies  (<250 copies / mL). A negative result  must be combined with clinical  observations, patient history, and epidemiological information. If result is POSITIVE SARS-CoV-2 target nucleic acids are DETECTED. The SARS-CoV-2 RNA is generally detectable in upper and lower  respiratory specimens dur ing the acute phase of infection.  Positive  results are indicative of active infection with SARS-CoV-2.  Clinical  correlation with patient history and other diagnostic information is  necessary to determine patient infection status.  Positive results do  not rule out bacterial infection or co-infection with other viruses. If result is PRESUMPTIVE POSTIVE SARS-CoV-2 nucleic acids MAY BE PRESENT.   A presumptive positive result was obtained on the submitted specimen  and confirmed on repeat testing.  While 2019 novel coronavirus  (SARS-CoV-2) nucleic acids may be present in the submitted sample  additional confirmatory testing may be necessary for epidemiological  and / or clinical management purposes  to differentiate between  SARS-CoV-2 and other Sarbecovirus currently known to  infect humans.  If clinically indicated additional testing with an alternate test  methodology [phone]([id number]) is advised. The SARS-CoV-2 RNA is generally  detectable in upper and lower respiratory sp ecimens during the acute  phase of infection. The expected result is Negative. Fact Sheet for Patients:  BoilerBrush.com.cyhttps://www.fda.gov/media/136312/download Fact Sheet for Healthcare Providers: https://pope.com/https://www.fda.gov/media/136313/download This test is not yet approved or cleared by the [place] FDA and has been authorized for detection and/or diagnosis of SARS-CoV-2 by FDA under an Emergency Use Authorization (EUA).  This EUA will remain in effect (meaning this test can be used) for the duration of the COVID-19 declaration under Section 564(b)(1) of the Act, 21 U.S.C. section 360bbb-3(b)(1), unless the authorization is terminated or revoked sooner. Performed at [facility], [address] [place], [place] [address]   Respiratory Panel by PCR     Status: None   Collection Time: 09/02/18 10:04 PM  Result Value Ref Range Status   Adenovirus NOT DETECTED NOT DETECTED Final   Coronavirus 229E NOT DETECTED NOT DETECTED Final    Comment: (NOTE) The Coronavirus on the Respiratory Panel, DOES NOT test for the novel  Coronavirus (2019 nCoV)    Coronavirus HKU1 NOT DETECTED NOT DETECTED Final   Coronavirus NL63 NOT DETECTED NOT DETECTED Final   Coronavirus OC43 NOT DETECTED NOT DETECTED Final   Metapneumovirus NOT DETECTED NOT DETECTED Final   Rhinovirus / Enterovirus NOT DETECTED NOT DETECTED Final   Influenza A NOT DETECTED NOT DETECTED Final   Influenza B NOT DETECTED NOT DETECTED Final   Parainfluenza Virus 1 NOT DETECTED NOT DETECTED Final   Parainfluenza Virus 2 NOT DETECTED NOT DETECTED Final   Parainfluenza Virus 3 NOT DETECTED NOT DETECTED Final   Parainfluenza Virus 4 NOT DETECTED NOT DETECTED Final   Respiratory Syncytial Virus NOT DETECTED NOT DETECTED Final   Bordetella  pertussis NOT DETECTED NOT DETECTED Final   Chlamydophila pneumoniae NOT DETECTED NOT DETECTED Final   Mycoplasma pneumoniae NOT DETECTED NOT DETECTED Final    Comment: Performed at [facility], [address] [place], [place] [address]     Labs: Basic Metabolic Panel: Recent Labs  Lab 09/02/18 1558 09/02/18 2222 09/03/18 0507  NA 144  --  141  K 4.0  --  4.1  CL 106  --  107  CO2 29  --  25  GLUCOSE 87  --  129*  BUN 12  --  14  CREATININE 0.89  --  0.84  CALCIUM 9.4  --  9.2  MG  --  2.6*  2.3  PHOS  --   --  3.8   Liver Function Tests: Recent Labs  Lab 09/03/18 0507  AST 21  ALT 16  ALKPHOS 58  BILITOT 1.1  PROT 6.7  ALBUMIN 4.1   No results for input(s): LIPASE, AMYLASE in the last 168 hours. No results for input(s): AMMONIA in the last 168 hours. CBC: Recent Labs  Lab 09/02/18 1558 09/02/18 2222 09/03/18 0507  WBC 5.2 4.4 6.0  NEUTROABS 2.9 3.7 5.1  HGB 13.5 13.6 13.1  HCT 43.4 42.8 40.3  MCV 97.7 99.1 96.2  PLT 292 290 290   Cardiac Enzymes: Recent Labs  Lab 09/02/18 2222  TROPONINI <0.03   BNP: BNP (last 3 results) No results for input(s): BNP in the last 8760 hours.  ProBNP (last 3 results) Recent Labs    01/04/18 1115  PROBNP 73.0    CBG: No results for input(s): GLUCAP in the last 168 hours.     Signed:  Rhetta Mura MD   [facility] 09/03/2018, 9:16 AM

## 2018-09-03 NOTE — Progress Notes (Signed)
SATURATION QUALIFICATIONS: (This note is used to comply with regulatory documentation for home oxygen)  Patient Saturations on Room Air at Rest =94%  Patient Saturations on Room Air while Ambulating = 92%  Dot Been, Yancey Flemings

## 2018-09-04 ENCOUNTER — Telehealth: Payer: Self-pay | Admitting: *Deleted

## 2018-09-04 LAB — HIV ANTIBODY (ROUTINE TESTING W REFLEX): HIV Screen 4th Generation wRfx: NONREACTIVE

## 2018-09-04 NOTE — Telephone Encounter (Signed)
Pt was on TCM report discharge 09/03/18 for COPD sxs. Pt already had a 6 month f/u appt scheduled for 09/06/18. Called pt to verify appt. Pt is aware and already spoke w/nurse concerning COVID screening. He's not capable of during virtual appt so he's coming to the office. Change appt to an hosp f/u appt and completed TCM call below.,/lmb  Transition Care Management Follow-up Telephone Call   Date discharged? 09/03/18   How have you been since you were released from the hospital? Pt states he is feeling alright   Do you understand why you were in the hospital? YES   Do you understand the discharge instructions? YES   Where were you discharged to? Home   Items Reviewed:  Medications reviewed: YES, currently taking prednisone pack and zpac  Allergies reviewed: YES  Dietary changes reviewed: YES, heart healthy  Referrals reviewed: No referral recommended, but he states he has appt w/his pulmonologist next Thurs 09/12/18   Functional Questionnaire:   Activities of Daily Living (ADLs):   He states he are independent in the following: ambulation, bathing and hygiene, feeding, continence, grooming, toileting and dressing States he doesn't require assistance    Any transportation issues/concerns? NO   Any patient concerns? NO   Confirmed importance and date/time of follow-up visits scheduled YES, appt 09/06/18  Provider Appointment booked with Dr. Jonny Ruiz  Confirmed with patient if condition begins to worsen call PCP or go to the ER.  Patient was given the office number and encouraged to call back with question or concerns.  : YES

## 2018-09-06 ENCOUNTER — Other Ambulatory Visit: Payer: Self-pay

## 2018-09-06 ENCOUNTER — Encounter: Payer: Self-pay | Admitting: Internal Medicine

## 2018-09-06 ENCOUNTER — Ambulatory Visit (INDEPENDENT_AMBULATORY_CARE_PROVIDER_SITE_OTHER): Payer: PPO | Admitting: Internal Medicine

## 2018-09-06 VITALS — BP 126/78 | HR 68 | Temp 97.6°F | Ht 63.0 in | Wt 156.0 lb

## 2018-09-06 DIAGNOSIS — Z8709 Personal history of other diseases of the respiratory system: Secondary | ICD-10-CM

## 2018-09-06 DIAGNOSIS — J441 Chronic obstructive pulmonary disease with (acute) exacerbation: Secondary | ICD-10-CM | POA: Diagnosis not present

## 2018-09-06 DIAGNOSIS — F0390 Unspecified dementia without behavioral disturbance: Secondary | ICD-10-CM

## 2018-09-06 DIAGNOSIS — R21 Rash and other nonspecific skin eruption: Secondary | ICD-10-CM

## 2018-09-06 DIAGNOSIS — J9601 Acute respiratory failure with hypoxia: Secondary | ICD-10-CM

## 2018-09-06 MED ORDER — CLOTRIMAZOLE-BETAMETHASONE 1-0.05 % EX CREA: TOPICAL_CREAM | CUTANEOUS | 1 refills | Status: DC
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
## 2018-09-06 NOTE — Patient Outreach (Signed)
[facility]) Care Management  09/06/2018  Alexander [name] 08-Sep-1932 [id number]   EMMI- General Discharge RED ON EMMI ALERT Day # 1 Date: 09/05/2018 Red Alert Reason:  Questions about discharge papers? Yes  Know who to call about changes in condition? No  Scheduled follow-up? Doesn't Apply  Other questions/problems? Yes    Outreach attempt: spoke with wife Alexander [name].  She is able to verify HIPAA.  Discussed reason for call and addressed red alerts.  She states she does not remember such a call but states that patient is doing ok.  However, she is concerned about some areas to his right arm that appear to be holes.  Advised her that they maybe some areas where blood was drawn or IV started.  Discussed areas and how they look and if there were any signs of redness or swelling.  She states no.  Advised her if areas begin to look infected to notify physician.  She verbalized understanding.  She states that she will ask physician about them today on the visit.  Advised her that she may not be able to go in with patient to visit so be sure to let nurse know of concerns.  She verbalized understanding.  Wife states she found out from the hospitalization that she could not be with the patient but glad CM advised her about this possibility with the MD appointment.  Wife denies any other problems.  She states patient has all his medications and taking a prescribed.  Wife appreciative of call but states she has a lot going on right now.  Advised on self care in order to care for her spouse she verbalized understanding.  CM contact information provided for any further questions or concerns.    Plan: RN CM will send letter and close case.  Bary Leriche, RN, MSN [facility] Care Management Care Management Coordinator Direct Line [phone] Toll Free: [phone]  Fax: [phone]

## 2018-09-06 NOTE — Patient Instructions (Signed)
Please take all new medication as prescribed - the cream  Please continue all other medications as before, and refills have been done if requested.  Please have the pharmacy call with any other refills you may need.  Please continue your efforts at being more active, low cholesterol diet  Please keep your appointments with your specialists as you may have planned  Please return in 6 months, or sooner if needed

## 2018-09-07 ENCOUNTER — Encounter: Payer: Self-pay | Admitting: Internal Medicine

## 2018-09-07 NOTE — Assessment & Plan Note (Signed)
Resolved, stable overall by history and exam, recent data reviewed with pt, and pt to continue medical treatment as before,  to f/u any worsening symptoms or concerns 

## 2018-09-07 NOTE — Assessment & Plan Note (Signed)
Mild to mod, for lotrisone asd prn,  to f/u any worsening symptoms or concerns 

## 2018-09-07 NOTE — Assessment & Plan Note (Signed)
stable overall by history and exam, recent data reviewed with pt, and pt to continue medical treatment as before,  to f/u any worsening symptoms or concerns  

## 2018-09-07 NOTE — Assessment & Plan Note (Signed)
Improved, cont same tx 

## 2018-09-09 ENCOUNTER — Telehealth: Payer: Self-pay | Admitting: Neurology

## 2018-09-11 ENCOUNTER — Telehealth: Payer: Self-pay | Admitting: Neurology

## 2018-09-11 ENCOUNTER — Other Ambulatory Visit: Payer: Self-pay

## 2018-09-11 NOTE — Telephone Encounter (Signed)
[name] returned your call. Thanks

## 2018-09-12 ENCOUNTER — Ambulatory Visit (INDEPENDENT_AMBULATORY_CARE_PROVIDER_SITE_OTHER): Payer: PPO | Admitting: Internal Medicine

## 2018-09-12 ENCOUNTER — Encounter: Payer: Self-pay | Admitting: Internal Medicine

## 2018-09-12 ENCOUNTER — Telehealth (INDEPENDENT_AMBULATORY_CARE_PROVIDER_SITE_OTHER): Payer: PPO | Admitting: Neurology

## 2018-09-12 DIAGNOSIS — F03B0 Unspecified dementia, moderate, without behavioral disturbance, psychotic disturbance, mood disturbance, and anxiety: Secondary | ICD-10-CM

## 2018-09-12 DIAGNOSIS — F0390 Unspecified dementia without behavioral disturbance: Secondary | ICD-10-CM

## 2018-09-12 DIAGNOSIS — J449 Chronic obstructive pulmonary disease, unspecified: Secondary | ICD-10-CM | POA: Diagnosis not present

## 2018-09-12 MED ORDER — PREDNISONE 10 MG PO TABS: ORAL_TABLET | ORAL | 11 refills | Status: DC

## 2018-09-12 NOTE — Patient Instructions (Addendum)
Plan A = Automatic = Symbicort 80 Take 2 puffs first thing in am and then another 2 puffs about 12 hours later.   Work on Chemical engineer technique:  relax and gently blow all the way out then take a nice smooth deep breath back in, triggering the inhaler at same time you start breathing in.  Hold for up to 5 seconds if you can. Blow out thru nose. Rinse and gargle with water when done     Plan B = Backup Only use your albuterol inhaler as a rescue medication to be used if you can't catch your breath by resting or doing a relaxed purse lip breathing pattern.  - The less you use it, the better it will work when you need it. - Ok to use the inhaler up to 2 puffs  every 4 hours if you must but call for appointment if use goes up over your usual need - Don't leave home without it !!  (think of it like the spare tire for your car)   Plan C = Crisis - only use your albuterol nebulizer if you first try Plan B and it fails to help > ok to use the nebulizer up to every 4 hours but if start needing it regularly call for immediate appointment   Plan D = Deltasone - take this if need nebulizer more than rarely  Prednisone 10 mg take  4 each am x 2 days,   2 each am x 2 days,  1 each am x 2 days and stop   Plan E = ER - go to ER or call 911 if all else fails     Please schedule a follow up visit in 3 months but call sooner if needed  with all medications /inhalers/ solutions in hand so we can verify exactly what you are taking. This includes all medications from all doctors and over the counters

## 2018-09-12 NOTE — Progress Notes (Signed)
Virtual Visit via Telephone Note The purpose of this virtual visit is to provide medical care while limiting exposure to the novel coronavirus.    Consent was obtained for phone visit:  Yes.   Answered questions that patient had about telehealth interaction:  Yes.   I discussed the limitations, risks, security and privacy concerns of performing an evaluation and management service by telephone. I also discussed with the patient that there may be a patient responsible charge related to this service. The patient expressed understanding and agreed to proceed.  Pt location: Home Physician Location: office Name of referring provider:  Corwin LevinsJohn, James W, MD I connected with .Alexander MorelHarry L [name] at patients initiation/request on 09/12/2018 at 11:30 AM EDT by telephone and verified that I am speaking with the correct person using two identifiers.  Pt MRN:  [id number] Pt DOB:  11/18/1932   History of Present Illness:  The patient was last seen in February 2020 for mild to moderate dementia. MMSE in February 2020 was 14/28 (patient illiterate). His wife called our office asking for an earlier appointment because he was worsening. He was admitted on 5/11 for COPD exacerbation, the shortness of breath is better. He cannot recall why he was in the hospital, stating he cannot think a lot of times. His wife reports that he was worsening because he was losing weight, he was previously 185 lbs and in a couple of months down to 158 lbs. He had no appetite. Since hospital discharge, his appetite is much better. She states he gets slightly confused every once in a while. His wife manages finances and medications. She states he does not drive. He is independent with dressing and bathing. Sleep is good, no wandering behavior. No paranoia or hallucinations. He had side effects on Donepezil and Rivastigmine, and was started on Memantine in February. They called to report nausea/vomiting, and dizziness on higher dose. He has  been taking 5mg  BID for the past 3 months with no further dizziness. He denies any headaches, no falls.   HPI: This is a pleasant 85 yo RH man with a history of hypertension, hyperlipidemia,with worsening memory loss. When asked about his memory, he states "there ain't none." He noticed memory changes in his late 1060s, he would forget names, however recently he would be talking about something and forget what he was talking about. He misplaces things frequently. He got lost driving one time. He was picking his wife up one time and did not know where he was (15 years ago). He occasionally forgets to take his medications and has to be told repeatedly to take it. He has noticed word-finding difficulties. No problems performing ADLs independently. He lives with his wife.  His daughter has noticed changes more in the past year or so. She would tell him something then the next minute he forgets. He asks the same questions repeatedly. At the end of the visit, his daughter walked out to speak with me personally, and stated that her father underreported symptoms, and that she and her mother have been concerned about his driving. She did not want to mention this in front of him because he would get upset. She denies any paranoia. He states he sees things moving around especially at night, like shadows, sometimes he thinks something is crawling around on the fence. He has constipation.  His father had memory loss.   I personally reviewed MRI brain done in 01/2014 which showed prominent mineral deposition in the globus pallidus, midbrain, and  dentate nucleus. This may be normal but has been described in Parkinson's like syndromes. Moderate chronic microvascular change.  Observations/Objective:  Patient is awake, alert, able to answer questions clearly but not oriented to year/day of week.  Montreal Cognitive Assessment  09/12/2018 (MOCA-Blind - done over phone)  Visuospatial/ Executive (0/5) -  Naming (0/3) -    Attention: Read list of digits (0/2) 1  Attention: Read list of letters (0/1) 1  Attention: Serial 7 subtraction starting at 100 (0/3) 0  Language: Repeat phrase (0/2) 2  Language : Fluency (0/1) 0  Abstraction (0/2) 0  Delayed Recall (0/5) 1  Orientation (0/6) 4  Total 9  Adjusted Score (based on education) 10   Assessment and Plan:   This is an 85 yo RH man with vascular risk factors including hypertension, hyperlipidemia, and moderate dementia, likely vascular. MRI brain showed moderate chronic microvascular disease. His wife called about worsening symptoms, however it appears memory has been stable since last visit 3 months ago, she was concerned about weight loss, and reports appetite is now better. MOCA-Blind (done over phone) today is 10/30 (MMSE 14/28 in February 2020). He is taking Memantine 5mg  BID, previously reporting dizziness on higher dose, we discussed another re-trial of 10mg  BID, and if dizziness recurs, continue on 5mg  BID. We discussed expectations from medications for dementia. No clear significant behavioral changes. We again discussed no driving. He will follow-up in 6 months, they know to call for any changes.   Follow Up Instructions:   -I discussed the assessment and treatment plan with the patient. The patient was provided an opportunity to ask questions and all were answered. The patient agreed with the plan and demonstrated an understanding of the instructions.   The patient was advised to call back or seek an in-person evaluation if the symptoms worsen or if the condition fails to improve as anticipated.    Total Time spent in visit with the patient was:  25 minutes, of which 100% of the time was spent in counseling and/or coordinating care on the above.   Pt understands and agrees with the plan of care outlined.     Van Clines, MD

## 2018-09-12 NOTE — Progress Notes (Signed)
Subjective:   Patient ID: Alexander MorelHarry L Riege, male    DOB: 1932-10-09    MRN: [id number]     Brief patient profile:  85  yobm quit smoking 04/2000    gold III COPD & dyspnea    01/09/13  Cardiac evaluation - echo nml  Stress test - peak VO2 68%, limited by ventilation  c. cath Alexander [name](Kelly) -11/10 - nml LV fn, nml coronaries  Spirometry >>FEV1 1.14 - 45% -moderate airway obstruction       05/04/2017 acute extended ov/Alexander [name] re:  COPD III  Really not clear what meds he's been taking nor is his wife sure Chief Complaint  Patient presents with  . Acute Visit    productive cough with clear,gray sputum,SOB w/ activity,feels it has improved with the inhaler he uses  daily cough x 3 months, worse in am but all day long and doe = MMRC3 = can't walk 100 yards even at a slow pace at a flat grade s stopping due to sob  - [facility] but can't get around without resting  Assoc rhinitis/ nasal congestion  Very confused again with meds  rec Symbicort 80  Take 2 puffs first thing in am and then another 2 puffs about 12 hours later.  Pantoprazole (protonix) 40 mg   Take  30-60 min before first meal of the day and Pepcid (famotidine)  20 mg one @  bedtime until return to office Prednisone 10 mg take  4 each am x 2 days,   2 each am x 2 days,  1 each am x 2 days and stop   Augmentin 875 mg take one pill twice daily  X 10 days - take at breakfast and supper with large glass of water.  GERD diet  Work on inhaler technique   Please schedule a follow up office visit in 2 weeks, sooner if needed with all medications / inhalers / solutions    05/21/2017  f/u ov/Alexander [name] re:  COPD GOLD III / main problem is coughing > sob  Chief Complaint  Patient presents with  . Follow-up    Cough is about the same. He has not been looking at what color sputum he is producing. He still has abx left over, he has only been taking 1 tablet daily.   Cough was gone to his satisfaction until 3 days prior to OV  Then acute  worse/ min productive/ still on augmentin  Wife has same "cold"   pt brought symbicort 80 sample well past the red line/ again confused with meds Dspnea:  No change = MMRC3 = can't walk 100 yards even at a slow pace at a flat grade s stopping due to sob  / not using neb saba at all or following action plan  Sleep: disrupted by coughing fits now and has to sleep propped up  rec Plan A = Automatic = symbicort 80 Take 2 puffs first thing in am and then another 2 puffs about 12 hours later.  Work on inhaler technique:   Plan B = Backup Only use your albuterol as a rescue medicatio Best cough medicine > delsym 2 tsp every 12 hours and if still coughing supplement with Tylenol #3 Finish your antibiotics Please see patient coordinator before you leave today  to schedule sinus CT  Please remember to go to the lab department downstairs in the basement  for your tests - we will call you with the results when they are available.  Please schedule a follow up office visit in 2 weeks,     06/04/2017  f/u ov/Alexander [name] re:  Ab/ sinusitis  Chief Complaint  Patient presents with  . Follow-up    Increased cough with grey sputum since the last visit.   using saba neb several times a day  Dyspnea:  MMRC2 = can't walk a nl pace on a flat grade s sob but does fine slow and flat  Cough: esp after supper variably productive min slt dark mucus Sleep: ok flat   Main complaints are related to nasal congestion   rec No change in medications for now Keep appt to see Dr Annalee GentaShoemaker > rec FESS> did not do          02/01/2018  f/u ov/Alexander [name] re:  UACS > sob  Did not bring all meds/ out of saba completely / poor hfa  Chief Complaint  Patient presents with  . Follow-up    Breathing is unchanged. He states he does not have an albuterol inhaler or neb.   Dyspnea: did 4 hours of shopping on day before ov which is much better than last ov though"breathing feels the same"  Cough: constant day > noct throat clearing since  09/2017  Sleeping: bed flat two pillows  rec Work on inhaler technique:   For cough use delsym up to 2 tsp every 12 hours as needed  GERD idet  Sinus CT   improved vs  [employer] Please schedule a follow up office visit in 6 weeks, call sooner if needed with all medications /inhalers/ solutions in hand so we can verify exactly what you are taking.     03/15/2018  f/u ov/Ronaldo [name] re: uacs  / ab symb 80 2bid did not bring meds  Chief Complaint  Patient presents with  . Follow-up    Breathing is overall doing well.  He has had some cough with yellow sputum. He is using his albuterol inhaler 2 x daily on average.   Dyspnea:  Not limited by breathing from desired activities   Cough: minimal daytime / slt beige < a tsp not exac in am  Sleeping: bed is flat 2 pillows  SABA use: never purchased.  No need for saba / no need for neb . 02: none   rec Plan A =  Symbicort 80 Take 2 puffs first thing in am and then another 2 puffs about 12 hours later.  Plan B = Back up >>>Only use your albuterol nebulizer as a rescue medication    Admit date: 09/02/2018 Discharge date: 09/03/2018    Recommendations for Outpatient Follow-up:  1. Complete azithromycin and prednisone as an outpatient 2. Does need x-ray and follow-up with Dr. Sherene SiresWert in the outpatient setting 3. Would recommend further screening and dementia management per Dr. Karel JarvisAquino patient's neurologist  Discharge Diagnoses:  Principal Problem:   COPD with acute exacerbation [facility](HCC) Active Problems:   Hyperlipidemia   Essential hypertension   Dementia without behavioral disturbance (HCC)   Acute respiratory failure with hypoxia (HCC)   COPD exacerbation (HCC)     History of present illness:  Briefly this is an 85 year old male who was admitted overnight 511 through 5/12 2020 with acute COPD exacerbation and flare He was sent to the emergency room by his pulmonologist Dr. Sherene SiresWert who felt there was risk for Covid19 based on the fact that [place] had  [place] 30 people present It was felt that at the time of admission  he was wheezing and he was uncomfortable however he never dropped his sats He was adamant about leaving on the morning of 5/12-he had no wheeze his labs were unremarkable he had nothing on chest x-ray and it was felt that he could probably go home to complete prednisone and azithromycin In addition-because of his dementia with a prior MMSE of 15/30, as well as his insistence on going home-although it would have been reasonable to keep him in the hospital at least overnight, for his own safety and because of his mild agitation and insistence, it was felt he would be best served going home on the day of d/c     09/12/2018  f/u ov/Teyana Pierron re: GOLD III / sp exac >>  post hosp ov :  covid 19 pcr neg / did not bring all meds  Chief Complaint  Patient presents with  . COPD GOLD III   Dyspnea:  Able to walk mb and back uphill to house  Cough: better Sleeping: ok on L side/ 2 pillows/flat bed / no AM Congestion SABA use: hfa and neb 02: none   No obvious day to day or daytime variability or assoc excess/ purulent sputum or mucus plugs or hemoptysis or cp or chest tightness, subjective wheeze or overt sinus or hb symptoms.   Sleeping as above  without nocturnal  or early am exacerbation  of respiratory  c/o's or need for noct saba. Also denies any obvious fluctuation of symptoms with weather or environmental changes or other aggravating or alleviating factors except as outlined above   No unusual exposure hx or h/o childhood pna/ asthma or knowledge of premature birth.  Current Allergies, Complete Past Medical History, Past Surgical History, Family History, and Social History were reviewed in [employer] record.  ROS  The following are not active complaints unless bolded Hoarseness, sore throat, dysphagia, dental problems, itching, sneezing,  nasal congestion or  discharge of excess mucus or purulent secretions, ear ache,   fever, chills, sweats, unintended wt loss or wt gain, classically pleuritic or exertional cp,  orthopnea pnd or arm/hand swelling  or leg swelling, presyncope, palpitations, abdominal pain, anorexia, nausea, vomiting, diarrhea  or change in bowel habits or change in bladder habits, change in stools or change in urine, dysuria, hematuria,  rash, arthralgias, visual complaints, headache, numbness, weakness or ataxia or problems with walking or coordination,  change in mood or  memory.        Current Meds  Medication Sig  . albuterol (PROAIR HFA) 108 (90 Base) MCG/ACT inhaler 2 puffs every 4 hours as needed only  if your can't catch your breath (Patient taking differently: Inhale 2 puffs into the lungs 2 (two) times a day. )  . albuterol (PROVENTIL) (2.5 MG/3ML) 0.083% nebulizer solution TAKE 3 MLS BY NEBULIZATION EVERY 6 (SIX) HOURS AS NEEDED FOR WHEEZING OR SHORTNESS OF BREATH. (Patient taking differently: Take 2.5 mg by nebulization every 6 (six) hours as needed for wheezing or shortness of breath. )  . bisacodyl (DULCOLAX) 5 MG EC tablet Take 1 tablet (5 mg total) by mouth 2 (two) times daily.  . budesonide-formoterol (SYMBICORT) 80-4.5 MCG/ACT inhaler Take 2 puffs first thing in am and then another 2 puffs about 12 hours later.  . clotrimazole-betamethasone (LOTRISONE) cream Use as directed twice per day as needed  . famotidine (PEPCID) 40 MG tablet Take 40 mg by mouth at bedtime.   Marland Kitchen lactulose (CHRONULAC) 10 GM/15ML solution TAKE 45 MLS (30 G TOTAL) BY  MOUTH 2 (TWO) TIMES DAILY AS NEEDED FOR MILD CONSTIPATION.  . memantine (NAMENDA) 10 MG tablet Take 1 tablet (10 mg total) by mouth 2 (two) times daily. (Patient taking differently: Take 5 mg by mouth 2 (two) times daily. )  . pantoprazole (PROTONIX) 40 MG tablet Take 1 tablet (40 mg total) by mouth daily as needed (for heartburn). (Patient taking differently: Take 40 mg by mouth daily  before breakfast. )                  Objective:   Physical Exam   09/12/2018     158 03/15/2018    165  02/01/2018    161  01/04/2018      166  10/18/2017      166 07/17/2017       173   06/04/2017      176  05/21/2017       175  05/04/2017       173   03/17/2016    182   02/03/14 177 lb (80.287 kg)  01/27/14 177 lb 12 oz (80.627 kg)  01/06/14 173 lb 8 oz (78.699 kg)    Vital signs reviewed - Note on arrival 02 sats  95% on RA    Elderly bm nad   HEENT: Full dentures/ nl turbinates bilaterally, and oropharynx. Nl external ear canals without cough reflex   NECK :  without JVD/Nodes/TM/ nl carotid upstrokes bilaterally   LUNGS: no acc muscle use,  Nl contour chest which is clear to A and P bilaterally without cough on insp or exp maneuvers   CV:  RRR  no s3 or murmur or increase in P2, and no edema   ABD:  soft and nontender with nl inspiratory excursion in the supine position. No bruits or organomegaly appreciated, bowel sounds nl  MS:    ext warm without deformities, calf tenderness, cyanosis or clubbing No obvious joint restrictions   SKIN: warm and dry with classic tinea corporis rash over chest    NEURO:  alert, approp  with  no motor or cerebellar deficits apparent.      I personally reviewed images and agree with radiology impression as follows:  pCXR:   09/02/2018:  No active dz   Labs ordered/ reviewed:      Chemistry      Component Value Date/Time   NA 141 09/03/2018 0507   K 4.1 09/03/2018 0507   CL 107 09/03/2018 0507   CO2 25 09/03/2018 0507   BUN 14 09/03/2018 0507   CREATININE 0.84 09/03/2018 0507   CREATININE 0.99 07/29/2014 0814      Component Value Date/Time   CALCIUM 9.2 09/03/2018 0507   ALKPHOS 58 09/03/2018 0507   AST 21 09/03/2018 0507   ALT 16 09/03/2018 0507   BILITOT 1.1 09/03/2018 0507        Lab Results  Component Value Date   WBC 6.0 09/03/2018   HGB 13.1 09/03/2018   HCT 40.3 09/03/2018   MCV 96.2 09/03/2018    PLT 290 09/03/2018         Lab Results  Component Value Date   TSH 0.263 (L) 09/03/2018               Assessment & Plan:

## 2018-09-13 ENCOUNTER — Ambulatory Visit: Payer: PPO | Admitting: Internal Medicine

## 2018-09-17 ENCOUNTER — Encounter: Payer: Self-pay | Admitting: Internal Medicine

## 2018-09-17 NOTE — Assessment & Plan Note (Addendum)
Quit smoking 2002  Spirometry 09/27/12  FEV1  1.06 (44%) ratio 47  - Spirometry 05/04/2017  FEV1 0.32 (16%)  Ratio 81 but f/v not physiologic   - 05/04/2017    > try symb 80 2bid as cough is the primary concern  - 05/21/2017  After extensive coaching inhaler device  effectiveness =    75% from a baseline of 25% - PFT's  07/17/2017  FEV1 1.17 (64 % ) ratio 64  p 20 % improvement from saba p nothing prior to study with DLCO  54 % corrects to 72  % for alv volume      - 09/12/2018  After extensive coaching inhaler device,  effectiveness =    75%  09/12/2018 added pred as plan D  Much better p standard rx for aecopd   He and wife still struggling with complexity of care and concept of accurate medication reconciliation and now planning neuro w/u for likely cognitive decline so plan is KIS from pulmonary perspective and keep him out of ER going forward so added prednisone to list of contingencies    I had an extended discussion with the patient and wife reviewing all relevant studies completed to date and  lasting 15 to 20 minutes of a 25 minute visit    See device teaching which extended face to face time for this visit.  Each maintenance medication was reviewed in detail including emphasizing most importantly the difference between maintenance and prns and under what circumstances the prns are to be triggered using an action plan format that is not reflected in the computer generated alphabetically organized AVS which I have not found useful in most complex patients, especially with respiratory illnesses  Please see AVS for specific instructions unique to this visit that I personally wrote and verbalized to the the pt in detail and then reviewed with pt  by my nurse highlighting any  changes in therapy recommended at today's visit to their plan of care.

## 2018-09-19 ENCOUNTER — Other Ambulatory Visit: Payer: Self-pay | Admitting: Internal Medicine

## 2018-10-01 ENCOUNTER — Other Ambulatory Visit: Payer: Self-pay

## 2018-10-01 ENCOUNTER — Encounter: Payer: Self-pay | Admitting: Internal Medicine

## 2018-10-01 ENCOUNTER — Ambulatory Visit (INDEPENDENT_AMBULATORY_CARE_PROVIDER_SITE_OTHER): Payer: PPO | Admitting: Internal Medicine

## 2018-10-01 VITALS — BP 126/82 | HR 77 | Temp 98.4°F | Ht 65.0 in | Wt 151.0 lb

## 2018-10-01 DIAGNOSIS — I10 Essential (primary) hypertension: Secondary | ICD-10-CM

## 2018-10-01 DIAGNOSIS — R739 Hyperglycemia, unspecified: Secondary | ICD-10-CM

## 2018-10-01 DIAGNOSIS — R21 Rash and other nonspecific skin eruption: Secondary | ICD-10-CM | POA: Diagnosis not present

## 2018-10-01 DIAGNOSIS — H9193 Unspecified hearing loss, bilateral: Secondary | ICD-10-CM

## 2018-10-01 MED ORDER — FLUCONAZOLE 100 MG PO TABS: 100.0000 mg | ORAL_TABLET | Freq: Every day | ORAL | 0 refills | Status: DC
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
## 2018-10-01 NOTE — Patient Instructions (Signed)
You can also take Mucinex (or it's generic off brand) for congestion  Please take all new medication as prescribed - the diflucan for 1 wk  Please continue all other medications as before, and refills have been done if requested.  Please have the pharmacy call with any other refills you may need.  Please continue your efforts at being more active, low cholesterol diet, and weight control.  Please keep your appointments with your specialists as you may have planned  You will be contacted regarding the referral for: Dermatology

## 2018-10-06 ENCOUNTER — Encounter: Payer: Self-pay | Admitting: Internal Medicine

## 2018-10-06 DIAGNOSIS — H9193 Unspecified hearing loss, bilateral: Secondary | ICD-10-CM | POA: Insufficient documentation

## 2018-10-06 NOTE — Assessment & Plan Note (Signed)
stable overall by history and exam, recent data reviewed with pt, and pt to continue medical treatment as before,  to f/u any worsening symptoms or concerns  

## 2018-10-06 NOTE — Assessment & Plan Note (Signed)
C/w fungal most likely, ok for diflucan course,  to f/u any worsening symptoms or concerns

## 2018-10-06 NOTE — Assessment & Plan Note (Signed)
Urged to wear his hearing aids, ok for mucinex otc for probable left eustachian valve like symptoms

## 2018-10-23 DIAGNOSIS — L720 Epidermal cyst: Secondary | ICD-10-CM | POA: Diagnosis not present

## 2018-10-23 DIAGNOSIS — L299 Pruritus, unspecified: Secondary | ICD-10-CM | POA: Diagnosis not present

## 2018-11-04 ENCOUNTER — Ambulatory Visit: Payer: PPO | Admitting: Internal Medicine

## 2018-11-14 ENCOUNTER — Telehealth: Payer: Self-pay | Admitting: Internal Medicine

## 2018-11-14 MED ORDER — LINACLOTIDE 72 MCG PO CAPS: 72.0000 ug | ORAL_CAPSULE | Freq: Every day | ORAL | 5 refills | Status: DC

## 2018-11-14 NOTE — Telephone Encounter (Signed)
Ok this is done 

## 2018-11-14 NOTE — Telephone Encounter (Signed)
Pt came in office month ago and Dr Jenny Reichmann was suppose to sent in prescription for Linzess and they never received it.    Please send to [facility]

## 2018-11-14 NOTE — Addendum Note (Signed)
Addended by: Biagio Borg on: 11/14/2018 12:18 PM   Modules accepted: Orders

## 2018-11-14 NOTE — Telephone Encounter (Signed)
Dr. Jenny Reichmann please advise. I do not see Linzess on the medicaiton list to send in.

## 2018-12-09 ENCOUNTER — Ambulatory Visit (INDEPENDENT_AMBULATORY_CARE_PROVIDER_SITE_OTHER): Payer: PPO | Admitting: Internal Medicine

## 2018-12-09 ENCOUNTER — Encounter: Payer: Self-pay | Admitting: Internal Medicine

## 2018-12-09 ENCOUNTER — Other Ambulatory Visit: Payer: Self-pay

## 2018-12-09 VITALS — BP 132/80 | HR 74 | Temp 96.4°F | Ht 65.0 in | Wt 154.6 lb

## 2018-12-09 DIAGNOSIS — R058 Other specified cough: Secondary | ICD-10-CM

## 2018-12-09 DIAGNOSIS — J449 Chronic obstructive pulmonary disease, unspecified: Secondary | ICD-10-CM

## 2018-12-09 DIAGNOSIS — R0609 Other forms of dyspnea: Secondary | ICD-10-CM | POA: Diagnosis not present

## 2018-12-09 DIAGNOSIS — R05 Cough: Secondary | ICD-10-CM

## 2018-12-09 MED ORDER — PREDNISONE 10 MG PO TABS: ORAL_TABLET | ORAL | 11 refills | Status: DC

## 2018-12-09 MED ORDER — BUDESONIDE-FORMOTEROL FUMARATE 80-4.5 MCG/ACT IN AERO: 2.0000 | INHALATION_SPRAY | Freq: Every day | RESPIRATORY_TRACT | 0 refills | Status: DC

## 2018-12-09 NOTE — Assessment & Plan Note (Signed)
Quit smoking 2002  Spirometry 09/27/12  FEV1  1.06 (44%) ratio 47  - Spirometry 05/04/2017  FEV1 0.32 (16%)  Ratio 81 but f/v not physiologic   - 05/04/2017    > try symb 80 2bid as cough is the primary concern  - 05/21/2017  After extensive coaching inhaler device  effectiveness =    75% from a baseline of 25% - PFT's  07/17/2017  FEV1 1.17 (64 % ) ratio 64  p 20 % improvement from saba p nothing prior to study with DLCO  54 % corrects to 72  % for alv volume   - 09/12/2018 added pred as plan D - 12/09/2018  After extensive coaching inhaler device,  effectiveness =    90%   His symbicort was well below the zero mark and he still doesn't understand contingency/ action plans (not does his wife articulate any understanding today) but despite these issues All goals of chronic asthma control met including optimal function and elimination of symptoms with minimal need for rescue therapy.  Contingencies discussed in full including contacting this office immediately if not controlling the symptoms using the rule of two's.      I had an extended discussion with the patient reviewing all relevant studies completed to date and  lasting 15 to 20 minutes of a 25 minute visit    I performed detailed device teaching using a teach back method which extended face to face time for this visit (see above)  Each maintenance medication was reviewed in detail including emphasizing most importantly the difference between maintenance and prns and under what circumstances the prns are to be triggered using an action plan format that is not reflected in the computer generated alphabetically organized AVS which I have not found useful in most complex patients, especially with respiratory illnesses  Please see AVS for specific instructions unique to this visit that I personally wrote and verbalized to the the pt in detail and then reviewed with pt  by my nurse highlighting any  changes in therapy recommended at today's visit to  their plan of care.

## 2018-12-09 NOTE — Assessment & Plan Note (Signed)
Allergy profile 05/21/2017 >  Eos 0.2 /  IgE  15 RAST neg -  Sinus CT 05/29/2017 1. Bilateral maxillary sinusitis with fluid levels. 2. Chronic left sphenoid sinusitis with mild mucosal thickening > f/u shoemaker: ov 07/09/17 c/w persistent sinusitis > rec FESS> pt canceled  - repeat Sinus CT 02/14/2018 > Bilateral maxillary sinus disease seen on the most recent CT scan has markedly improved. There is mild residual mucosal thickening in the inferior maxillary sinuses bilaterally. Mild mucosal thickening in the sphenoid sinuses is greater on the left and has increased since the prior study.    Cough resolved on gerd rx and avoiding high dose ICS> no change rx

## 2018-12-09 NOTE — Progress Notes (Signed)
Subjective:   Patient ID: Alexander MorelHarry L [name], male    DOB: 1932-10-09    MRN: [id number]     Brief patient profile:  85  yobm quit smoking 04/2000    gold III COPD & dyspnea    01/09/13  Cardiac evaluation - echo nml  Stress test - peak VO2 68%, limited by ventilation  c. cath [name] -11/10 - nml LV fn, nml coronaries  Spirometry >>FEV1 1.14 - 45% -moderate airway obstruction       05/04/2017 acute extended ov/Ozell Juhasz re:  COPD III  Really not clear what meds he's been taking nor is his wife sure Chief Complaint  Patient presents with  . Acute Visit    productive cough with clear,gray sputum,SOB w/ activity,feels it has improved with the inhaler he uses  daily cough x 3 months, worse in am but all day long and doe = MMRC3 = can't walk 100 yards even at a slow pace at a flat grade s stopping due to sob  - [facility] but can't get around without resting  Assoc rhinitis/ nasal congestion  Very confused again with meds  rec Symbicort 80  Take 2 puffs first thing in am and then another 2 puffs about 12 hours later.  Pantoprazole (protonix) 40 mg   Take  30-60 min before first meal of the day and Pepcid (famotidine)  20 mg one @  bedtime until return to office Prednisone 10 mg take  4 each am x 2 days,   2 each am x 2 days,  1 each am x 2 days and stop   Augmentin 875 mg take one pill twice daily  X 10 days - take at breakfast and supper with large glass of water.  GERD diet  Work on inhaler technique   Please schedule a follow up office visit in 2 weeks, sooner if needed with all medications / inhalers / solutions    05/21/2017  f/u ov/[name] re:  COPD GOLD III / main problem is coughing > sob  Chief Complaint  Patient presents with  . Follow-up    Cough is about the same. He has not been looking at what color sputum he is producing. He still has abx left over, he has only been taking 1 tablet daily.   Cough was gone to his satisfaction until 3 days prior to OV  Then acute  worse/ min productive/ still on augmentin  Wife has same "cold"   pt brought symbicort 80 sample well past the red line/ again confused with meds Dspnea:  No change = MMRC3 = can't walk 100 yards even at a slow pace at a flat grade s stopping due to sob  / not using neb saba at all or following action plan  Sleep: disrupted by coughing fits now and has to sleep propped up  rec Plan A = Automatic = symbicort 80 Take 2 puffs first thing in am and then another 2 puffs about 12 hours later.  Work on inhaler technique:   Plan B = Backup Only use your albuterol as a rescue medicatio Best cough medicine > delsym 2 tsp every 12 hours and if still coughing supplement with Tylenol #3 Finish your antibiotics Please see patient coordinator before you leave today  to schedule sinus CT  Please remember to go to the lab department downstairs in the basement  for your tests - we will call you with the results when they are available.  Please schedule a follow up office visit in 2 weeks,     06/04/2017  f/u ov/[name] re:  Ab/ sinusitis  Chief Complaint  Patient presents with  . Follow-up    Increased cough with grey sputum since the last visit.   using saba neb several times a day  Dyspnea:  MMRC2 = can't walk a nl pace on a flat grade s sob but does fine slow and flat  Cough: esp after supper variably productive min slt dark mucus Sleep: ok flat   Main complaints are related to nasal congestion   rec No change in medications for now Keep appt to see Dr Annalee GentaShoemaker > rec FESS> did not do          02/01/2018  f/u ov/[name] re:  UACS > sob  Did not bring all meds/ out of saba completely / poor hfa  Chief Complaint  Patient presents with  . Follow-up    Breathing is unchanged. He states he does not have an albuterol inhaler or neb.   Dyspnea: did 4 hours of shopping on day before ov which is much better than last ov though"breathing feels the same"  Cough: constant day > noct throat clearing since  09/2017  Sleeping: bed flat two pillows  rec Work on inhaler technique:   For cough use delsym up to 2 tsp every 12 hours as needed  GERD idet  Sinus CT   improved vs  [employer] Please schedule a follow up office visit in 6 weeks, call sooner if needed with all medications /inhalers/ solutions in hand so we can verify exactly what you are taking.     03/15/2018  f/u ov/[name] re: uacs  / ab symb 80 2bid did not bring meds  Chief Complaint  Patient presents with  . Follow-up    Breathing is overall doing well.  He has had some cough with yellow sputum. He is using his albuterol inhaler 2 x daily on average.   Dyspnea:  Not limited by breathing from desired activities   Cough: minimal daytime / slt beige < a tsp not exac in am  Sleeping: bed is flat 2 pillows  SABA use: never purchased.  No need for saba / no need for neb . 02: none   rec Plan A =  Symbicort 80 Take 2 puffs first thing in am and then another 2 puffs about 12 hours later.  Plan B = Back up >>>Only use your albuterol nebulizer as a rescue medication    Admit date: 09/02/2018 Discharge date: 09/03/2018    Recommendations for Outpatient Follow-up:  1. Complete azithromycin and prednisone as an outpatient 2. Does need x-ray and follow-up with Dr. Sherene SiresWert in the outpatient setting 3. Would recommend further screening and dementia management per Dr. Karel JarvisAquino patient's neurologist  Discharge Diagnoses:  Principal Problem:   COPD with acute exacerbation [facility](HCC) Active Problems:   Hyperlipidemia   Essential hypertension   Dementia without behavioral disturbance (HCC)   Acute respiratory failure with hypoxia (HCC)   COPD exacerbation (HCC)     History of present illness:  Briefly this is an 85 year old male who was admitted overnight 511 through 5/12 2020 with acute COPD exacerbation and flare He was sent to the emergency room by his pulmonologist Dr. Sherene SiresWert who felt there was risk for Covid19 based on the fact that [place] had  [place] 30 people present It was felt that at the time of admission  he was wheezing and he was uncomfortable however he never dropped his sats He was adamant about leaving on the morning of 5/12-he had no wheeze his labs were unremarkable he had nothing on chest x-ray and it was felt that he could probably go home to complete prednisone and azithromycin In addition-because of his dementia with a prior MMSE of 15/30, as well as his insistence on going home-although it would have been reasonable to keep him in the hospital at least overnight, for his own safety and because of his mild agitation and insistence, it was felt he would be best served going home on the day of d/c     09/12/2018  f/u ov/Ave Scharnhorst re: GOLD III / sp exac >>  post hosp ov :  covid 19 pcr neg / did not bring all meds  Chief Complaint  Patient presents with  . COPD GOLD III   Dyspnea:  Able to walk mb and back uphill to house  Cough: better Sleeping: ok on L side/ 2 pillows/flat bed / no AM Congestion SABA use: hfa and neb rec Plan A = Automatic = Symbicort 80 Take 2 puffs first thing in am and then another 2 puffs about 12 hours later.  Work on Archivistperfecting inhaler technique: Plan B = Backup Only use your albuterol inhaler as a rescue medication  Plan C = Crisis - only use your albuterol nebulizer if you first try Plan B and it fails to help > ok to use the nebulizer up to every 4 hours but if start needing it regularly call for immediate appointment Plan D = Deltasone - take this if need nebulizer more than rarely  Prednisone 10 mg take  4 each am x 2 days,   2 each am x 2 days,  1 each am x 2 days and stop  Plan E = ER - go to ER or call 911 if all else fails     12/09/2018  f/u ov/Natiya Seelinger re: GOLD III  Did not bring all  meds / still not clear on ABCD plan  Chief Complaint  Patient presents with  . Follow-up    Patient reports that he is doing ok. He reports sob with alot of  exertion. He reports using his rescue inhaler 1-2 times per week.   Dyspnea:  Able to work yard/ weed eating / push mower - "out in yard all day"  Cough: no  Sleeping: ok flat in bed SABA use: once a week = rescue and neb about the same frequency  02: none    No obvious day to day or daytime variability or assoc excess/ purulent sputum or mucus plugs or hemoptysis or cp or chest tightness, subjective wheeze or overt sinus or hb symptoms.   Sleeping ok  without nocturnal  or early am exacerbation  of respiratory  c/o's or need for noct saba. Also denies any obvious fluctuation of symptoms with weather or environmental changes or other aggravating or alleviating factors except as outlined above   No unusual exposure hx or h/o childhood pna/ asthma or knowledge of premature birth.  Current Allergies, Complete Past Medical History, Past Surgical History, Family History, and Social History were reviewed in [facility] electronic medical record.  ROS  The following are not active complaints unless bolded Hoarseness, sore throat, dysphagia, dental problems, itching, sneezing,  nasal congestion or discharge of excess mucus or purulent secretions, ear ache,   fever, chills, sweats, unintended wt loss or wt gain, classically pleuritic  or exertional cp,  orthopnea pnd or arm/hand swelling  or leg swelling, presyncope, palpitations, abdominal pain, anorexia, nausea, vomiting, diarrhea  or change in bowel habits or change in bladder habits, change in stools or change in urine, dysuria, hematuria,  rash, arthralgias, visual complaints, headache, numbness, weakness or ataxia or problems with walking or coordination,  change in mood or  memory.        Current Meds  Medication Sig  . albuterol (PROAIR HFA) 108 (90 Base) MCG/ACT inhaler 2 puffs every 4 hours as needed only  if your can't catch your breath (Patient taking differently: Inhale 2 puffs into the lungs 2 (two) times a day. )  . albuterol  (PROVENTIL) (2.5 MG/3ML) 0.083% nebulizer solution Take 3 mLs (2.5 mg total) by nebulization every 6 (six) hours as needed for wheezing or shortness of breath. DX J44.9  . budesonide-formoterol (SYMBICORT) 80-4.5 MCG/ACT inhaler Take 2 puffs first thing in am and then another 2 puffs about 12 hours later.  . citalopram (CELEXA) 10 MG tablet Take 10 mg by mouth daily.  . famotidine (PEPCID) 40 MG tablet Take 40 mg by mouth at bedtime.   Marland Kitchen lactulose (CHRONULAC) 10 GM/15ML solution TAKE 45 MLS (30 G TOTAL) BY MOUTH 2 (TWO) TIMES DAILY AS NEEDED FOR MILD CONSTIPATION.  Marland Kitchen linaclotide (LINZESS) 72 MCG capsule Take 1 capsule (72 mcg total) by mouth daily before breakfast.  . memantine (NAMENDA) 10 MG tablet Take 1 tablet (10 mg total) by mouth 2 (two) times daily. (Patient taking differently: Take 5 mg by mouth 2 (two) times daily. )  . pantoprazole (PROTONIX) 40 MG tablet Take 1 tablet (40 mg total) by mouth daily as needed (for heartburn). (Patient taking differently: Take 40 mg by mouth daily before breakfast. )  . [DISCONTINUED] bisacodyl (DULCOLAX) 5 MG EC tablet Take 1 tablet (5 mg total) by mouth 2 (two) times daily.  . [DISCONTINUED] clotrimazole-betamethasone (LOTRISONE) cream Use as directed twice per day as needed  . [DISCONTINUED] fluconazole (DIFLUCAN) 100 MG tablet Take 1 tablet (100 mg total) by mouth daily.  .   predniSONE (DELTASONE) 10 MG tablet Take  4 each am x 2 days,   2 each am x 2 days,  1 each am x 2 days and stop                   Objective:   Physical Exam  12/09/2018      154   09/12/2018     158 03/15/2018    165  02/01/2018    161  01/04/2018      166  10/18/2017      166 07/17/2017       173   06/04/2017      176  05/21/2017       175  05/04/2017       173   03/17/2016    182   02/03/14 177 lb (80.287 kg)  01/27/14 177 lb 12 oz (80.627 kg)  01/06/14 173 lb 8 oz (78.699 kg)    Vital signs reviewed - Note on arrival 02 sats  97% on RA      Elderly amb bm nad,  easily confused with details of care    HEENT:Full dentures/ nl turbinates bilaterally, and oropharynx. Nl external ear canals without cough reflex   NECK :  without JVD/Nodes/TM/ nl carotid upstrokes bilaterally   LUNGS: no acc muscle use,  Nl contour chest which is clear to A and P  bilaterally without cough on insp or exp maneuvers   CV:  RRR  no s3 or murmur or increase in P2, and no edema   ABD:  soft and nontender with nl inspiratory excursion in the supine position. No bruits or organomegaly appreciated, bowel sounds nl  MS:  Nl gait/ ext warm without deformities, calf tenderness, cyanosis or clubbing No obvious joint restrictions   SKIN: warm and dry without lesions    NEURO:  alert, approp, nl sensorium with  no motor or cerebellar deficits apparent.           Assessment & Plan:

## 2018-12-09 NOTE — Patient Instructions (Addendum)
Plan A = Automatic = Symbicort 80 Take 2 puffs first thing in am and then another 2 puffs about 12 hours later.   Work on Doctor, hospital technique:  relax and gently blow all the way out then take a nice smooth deep breath back in, triggering the inhaler at same time you start breathing in.  Hold for up to 5 seconds if you can. Blow out thru nose. Rinse and gargle with water when done  Plan B = Backup Only use your albuterol inhaler as a rescue medication to be used if you can't catch your breath by resting or doing a relaxed purse lip breathing pattern.  - The less you use it, the better it will work when you need it. - Ok to use the inhaler up to 2 puffs  every 4 hours if you must but call for appointment if use goes up over your usual need - Don't leave home without it !!  (think of it like the spare tire for your car)   Plan C = Crisis - only use your albuterol nebulizer if you first try Plan B and it fails to help > ok to use the nebulizer up to every 4 hours but if start needing it regularly call for immediate appointment   Plan D = Deltasone (Prednisone)  - take this if need nebulizer more than rarely  Prednisone 10 mg take  4 each am x 2 days,   2 each am x 2 days,  1 each am x 2 days and stop   Plan E = ER - go to ER or call 911 if all else fails     Please schedule a follow up visit in 3 months but call sooner if needed  with all medications /inhalers/ solutions in hand so we can verify exactly what you are taking. This includes all medications from all doctors and over the counters

## 2018-12-29 ENCOUNTER — Other Ambulatory Visit: Payer: Self-pay | Admitting: Internal Medicine

## 2019-01-03 ENCOUNTER — Encounter: Payer: Self-pay | Admitting: Internal Medicine

## 2019-01-03 ENCOUNTER — Other Ambulatory Visit: Payer: Self-pay

## 2019-01-03 ENCOUNTER — Ambulatory Visit (INDEPENDENT_AMBULATORY_CARE_PROVIDER_SITE_OTHER): Payer: PPO | Admitting: Internal Medicine

## 2019-01-03 VITALS — BP 126/80 | HR 76 | Temp 98.5°F | Ht 65.0 in | Wt 152.0 lb

## 2019-01-03 DIAGNOSIS — I10 Essential (primary) hypertension: Secondary | ICD-10-CM | POA: Diagnosis not present

## 2019-01-03 DIAGNOSIS — N401 Enlarged prostate with lower urinary tract symptoms: Secondary | ICD-10-CM | POA: Diagnosis not present

## 2019-01-03 DIAGNOSIS — R739 Hyperglycemia, unspecified: Secondary | ICD-10-CM

## 2019-01-03 DIAGNOSIS — K5909 Other constipation: Secondary | ICD-10-CM | POA: Diagnosis not present

## 2019-01-03 DIAGNOSIS — K921 Melena: Secondary | ICD-10-CM

## 2019-01-03 DIAGNOSIS — N138 Other obstructive and reflux uropathy: Secondary | ICD-10-CM | POA: Diagnosis not present

## 2019-01-03 MED ORDER — LINACLOTIDE 145 MCG PO CAPS: 145.0000 ug | ORAL_CAPSULE | Freq: Every day | ORAL | 3 refills | Status: DC

## 2019-01-03 MED ORDER — TAMSULOSIN HCL 0.4 MG PO CAPS: 0.4000 mg | ORAL_CAPSULE | Freq: Every day | ORAL | 3 refills | Status: DC
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
## 2019-01-03 NOTE — Patient Instructions (Signed)
OK to take 2 of the Linzess 72 mg per day to use them up  After that, Please take all new medication as prescribed - the higher dose Linzess at 145 mg per day  Please take all new medication as prescribed - the flomax for the prostate (and let us know if the insurance does not cover)  Please continue all other medications as before, and refills have been done if requested.  Please have the pharmacy call with any other refills you may need.  Please continue your efforts at being more active, low cholesterol diet, and weight control  Please keep your appointments with your specialists as you may have planned

## 2019-01-04 ENCOUNTER — Encounter: Payer: Self-pay | Admitting: Internal Medicine

## 2019-01-04 NOTE — Assessment & Plan Note (Signed)
Etiology unclear, declines cbc or GI evaluation today

## 2019-01-04 NOTE — Assessment & Plan Note (Signed)
For flomax asd,  to f/u any worsening symptoms or concerns

## 2019-01-04 NOTE — Assessment & Plan Note (Signed)
stable overall by history and exam, recent data reviewed with pt, and pt to continue medical treatment as before,  to f/u any worsening symptoms or concerns  

## 2019-01-04 NOTE — Assessment & Plan Note (Signed)
[place] for increased linzess to 145 qd,  to f/u any worsening symptoms or concerns

## 2019-01-07 ENCOUNTER — Ambulatory Visit: Payer: PPO | Admitting: Neurology

## 2019-02-12 DIAGNOSIS — Z23 Encounter for immunization: Secondary | ICD-10-CM | POA: Diagnosis not present

## 2019-02-12 DIAGNOSIS — L720 Epidermal cyst: Secondary | ICD-10-CM | POA: Diagnosis not present

## 2019-02-12 DIAGNOSIS — L299 Pruritus, unspecified: Secondary | ICD-10-CM | POA: Diagnosis not present

## 2019-02-19 ENCOUNTER — Ambulatory Visit (INDEPENDENT_AMBULATORY_CARE_PROVIDER_SITE_OTHER): Payer: PPO | Admitting: Internal Medicine

## 2019-02-19 ENCOUNTER — Encounter: Payer: Self-pay | Admitting: Internal Medicine

## 2019-02-19 ENCOUNTER — Other Ambulatory Visit: Payer: Self-pay

## 2019-02-19 VITALS — BP 122/82 | HR 99 | Temp 98.4°F | Ht 65.0 in | Wt 154.0 lb

## 2019-02-19 DIAGNOSIS — J449 Chronic obstructive pulmonary disease, unspecified: Secondary | ICD-10-CM

## 2019-02-19 DIAGNOSIS — M25511 Pain in right shoulder: Secondary | ICD-10-CM | POA: Diagnosis not present

## 2019-02-19 DIAGNOSIS — R739 Hyperglycemia, unspecified: Secondary | ICD-10-CM | POA: Diagnosis not present

## 2019-02-19 DIAGNOSIS — F0390 Unspecified dementia without behavioral disturbance: Secondary | ICD-10-CM

## 2019-02-19 DIAGNOSIS — I10 Essential (primary) hypertension: Secondary | ICD-10-CM | POA: Diagnosis not present

## 2019-02-19 MED ORDER — TRAMADOL HCL 50 MG PO TABS: 50.0000 mg | ORAL_TABLET | Freq: Four times a day (QID) | ORAL | 0 refills | Status: DC | PRN

## 2019-02-19 NOTE — Assessment & Plan Note (Signed)
stable overall by history and exam, recent data reviewed with pt, and pt to continue medical treatment as before,  to f/u any worsening symptoms or concerns  
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
## 2019-02-19 NOTE — Assessment & Plan Note (Signed)
C/w bursitis vs rotater cuff tendonitis - for referral sport med for further consideration and probable cortisone

## 2019-02-19 NOTE — Patient Instructions (Signed)
Please take all new medication as prescribed - the pain medication only if needed  Please continue all other medications as before, and refills have been done if requested.  Please have the pharmacy call with any other refills you may need.  Please continue your efforts at being more active, low cholesterol diet, and weight control.  Please keep your appointments with your specialists as you may have planned  You will be contacted regarding the referral for: Sports Medicine in this office

## 2019-02-21 DIAGNOSIS — M25511 Pain in right shoulder: Secondary | ICD-10-CM | POA: Diagnosis not present

## 2019-02-25 ENCOUNTER — Other Ambulatory Visit: Payer: Self-pay | Admitting: Internal Medicine

## 2019-02-25 DIAGNOSIS — R0609 Other forms of dyspnea: Secondary | ICD-10-CM

## 2019-02-25 DIAGNOSIS — J449 Chronic obstructive pulmonary disease, unspecified: Secondary | ICD-10-CM

## 2019-02-25 MED ORDER — BUDESONIDE-FORMOTEROL FUMARATE 80-4.5 MCG/ACT IN AERO: 2.0000 | INHALATION_SPRAY | Freq: Every day | RESPIRATORY_TRACT | 2 refills | Status: DC

## 2019-02-25 NOTE — Telephone Encounter (Signed)
Requested medication (s) are due for refill today: yes  Requested medication (s) are on the active medication list: yes  Last refill:  12/09/2018  Future visit scheduled: yes  Notes to clinic:  Last filled by different provider    Requested Prescriptions  Pending Prescriptions Disp Refills   budesonide-formoterol (SYMBICORT) 80-4.5 MCG/ACT inhaler 1 Inhaler 0    Sig: Inhale 2 puffs into the lungs daily.     There is no refill protocol information for this order

## 2019-02-25 NOTE — Telephone Encounter (Signed)
Medication Refill - Medication: budesonide-formoterol (SYMBICORT) 80-4.5 MCG/ACT inhaler [[id number]]   Has the patient contacted their pharmacy? No. (Agent: If no, request that the patient contact the pharmacy for the refill.) (Agent: If yes, when and what did the pharmacy advise?)  Preferred Pharmacy (with phone number or street name):  [id number] [address], [place]. [phone] (Phone)     Agent: Please be advised that RX refills may take up to 3 business days. We ask that you follow-up with your pharmacy.

## 2019-02-25 NOTE — Telephone Encounter (Signed)
Routing to CMA 

## 2019-03-06 ENCOUNTER — Ambulatory Visit: Payer: PPO | Admitting: Family Medicine

## 2019-03-11 ENCOUNTER — Ambulatory Visit (INDEPENDENT_AMBULATORY_CARE_PROVIDER_SITE_OTHER): Payer: PPO | Admitting: Internal Medicine

## 2019-03-11 ENCOUNTER — Encounter: Payer: Self-pay | Admitting: Internal Medicine

## 2019-03-11 ENCOUNTER — Other Ambulatory Visit: Payer: Self-pay

## 2019-03-11 DIAGNOSIS — J449 Chronic obstructive pulmonary disease, unspecified: Secondary | ICD-10-CM

## 2019-03-11 MED ORDER — PREDNISONE 10 MG PO TABS: ORAL_TABLET | ORAL | 11 refills | Status: DC

## 2019-03-11 MED ORDER — BUDESONIDE-FORMOTEROL FUMARATE 80-4.5 MCG/ACT IN AERO: INHALATION_SPRAY | RESPIRATORY_TRACT | 11 refills | Status: DC

## 2019-03-11 MED ORDER — ALBUTEROL SULFATE HFA 108 (90 BASE) MCG/ACT IN AERS: INHALATION_SPRAY | RESPIRATORY_TRACT | 11 refills | Status: DC

## 2019-03-11 NOTE — Assessment & Plan Note (Signed)
Quit smoking 2002  Spirometry 09/27/12  FEV1  1.06 (44%) ratio 47  - Spirometry 05/04/2017  FEV1 0.32 (16%)  Ratio 81 but f/v not physiologic   - 05/04/2017    > try symb 80 2bid as cough is the primary concern   - PFT's  07/17/2017  FEV1 1.17 (64 % ) ratio 64  p 20 % improvement from saba p nothing prior to study with DLCO  54 % corrects to 72  % for alv volume   - 09/12/2018 added pred as plan D - 03/11/2019  After extensive coaching inhaler device,  effectiveness =    75% > continue symb 80 2bid   Adequate control on present rx, reviewed in detail with pt > no change in rx needed    Reviewed plan ABCDE line by line with pt/ wife.   I had an extended discussion with the patient reviewing all relevant studies completed to date and  lasting 15 to 20 minutes of a 25 minute visit    I performed detailed device teaching using a teach back method which extended face to face time for this visit (see above)  Each maintenance medication was reviewed in detail including emphasizing most importantly the difference between maintenance and prns and under what circumstances the prns are to be triggered using an action plan format that is not reflected in the computer generated alphabetically organized AVS which I have not found useful in most complex patients, especially with respiratory illnesses  Please see AVS for specific instructions unique to this visit that I personally wrote and verbalized to the the pt in detail and then reviewed with pt  by my nurse highlighting any  changes in therapy recommended at today's visit to their plan of care.

## 2019-03-11 NOTE — Progress Notes (Signed)
Subjective:   Patient ID: Alexander [name], male    DOB: 1932/08/28    MRN: [id number]     Brief patient profile:  86  yobm quit smoking 04/2000    gold III COPD     01/09/13  Cardiac evaluation - echo nml  Stress test - peak VO2 68%, limited by ventilation  c. cath [facility]) -11/10 - nml LV fn, nml coronaries  Spirometry >>FEV1 1.14 - 45% -moderate airway obstruction       05/04/2017 acute extended ov/Alexander [name] re:  COPD III  Really not clear what meds he's been taking nor is his wife sure Chief Complaint  Patient presents with  . Acute Visit    productive cough with clear,gray sputum,SOB w/ activity,feels it has improved with the inhaler he uses  daily cough x 3 months, worse in am but all day long and doe = MMRC3 = can't walk 100 yards even at a slow pace at a flat grade s stopping due to sob  - still shops at [facility] but can't get around without resting  Assoc rhinitis/ nasal congestion  Very confused again with meds  rec Symbicort 80  Take 2 puffs first thing in am and then another 2 puffs about 12 hours later.  Pantoprazole (protonix) 40 mg   Take  30-60 min before first meal of the day and Pepcid (famotidine)  20 mg one @  bedtime until return to office Prednisone 10 mg take  4 each am x 2 days,   2 each am x 2 days,  1 each am x 2 days and stop   Augmentin 875 mg take one pill twice daily  X 10 days - take at breakfast and supper with large glass of water.  GERD diet  Work on inhaler technique   Please schedule a follow up office visit in 2 weeks, sooner if needed with all medications / inhalers / solutions    05/21/2017  f/u ov/Alexander [name] re:  COPD GOLD III / main problem is coughing > sob  Chief Complaint  Patient presents with  . Follow-up    Cough is about the same. He has not been looking at what color sputum he is producing. He still has abx left over, he has only been taking 1 tablet daily.   Cough was gone to his satisfaction until 3 days prior to OV  Then acute worse/ min  productive/ still on augmentin  Wife has same "cold"   pt brought symbicort 80 sample well past the red line/ again confused with meds Dspnea:  No change = MMRC3 = can't walk 100 yards even at a slow pace at a flat grade s stopping due to sob  / not using neb saba at all or following action plan  Sleep: disrupted by coughing fits now and has to sleep propped up  rec Plan A = Automatic = symbicort 80 Take 2 puffs first thing in am and then another 2 puffs about 12 hours later.  Work on inhaler technique:   Plan B = Backup Only use your albuterol as a rescue medicatio Best cough medicine > delsym 2 tsp every 12 hours and if still coughing supplement with Tylenol #3 Finish your antibiotics Please see patient coordinator before you leave today  to schedule sinus CT  Please remember to go to the lab department downstairs in the basement  for your tests - we will call you with the results when they are available.    Please  schedule a follow up office visit in 2 weeks,     06/04/2017  f/u ov/Alexander [name] re:  Ab/ sinusitis  Chief Complaint  Patient presents with  . Follow-up    Increased cough with grey sputum since the last visit.   using saba neb several times a day  Dyspnea:  MMRC2 = can't walk a nl pace on a flat grade s sob but does fine slow and flat  Cough: esp after supper variably productive min slt dark mucus Sleep: ok flat   Main complaints are related to nasal congestion   rec No change in medications for now Keep appt to see Dr Annalee GentaShoemaker > rec FESS> did not do      03/15/2018  f/u ov/Alexander [name] re: uacs  / ab symb 80 2bid did not bring meds  Chief Complaint  Patient presents with  . Follow-up    Breathing is overall doing well.  He has had some cough with yellow sputum. He is using his albuterol inhaler 2 x daily on average.   Dyspnea:  Not limited by breathing from desired activities   Cough: minimal daytime / slt beige < a tsp not exac in am  Sleeping: bed is flat 2 pillows  SABA  use: never purchased.  No need for saba / no need for neb . 02: none   rec Plan A =  Symbicort 80 Take 2 puffs first thing in am and then another 2 puffs about 12 hours later.  Plan B = Back up >>>Only use your albuterol nebulizer as a rescue medication         09/12/2018  f/u ov/Alexander [name] re: GOLD III / sp exac >>  post hosp ov :  covid 19 pcr neg / did not bring all meds  Chief Complaint  Patient presents with  . COPD GOLD III   Dyspnea:  Able to walk mb and back uphill to house  Cough: better Sleeping: ok on L side/ 2 pillows/flat bed / no AM Congestion SABA use: hfa and neb rec Plan A = Automatic = Symbicort 80 Take 2 puffs first thing in am and then another 2 puffs about 12 hours later.  Work on Archivistperfecting inhaler technique: Plan B = Backup Only use your albuterol inhaler as a rescue medication  Plan C = Crisis - only use your albuterol nebulizer if you first try Plan B and it fails to help > ok to use the nebulizer up to every 4 hours but if start needing it regularly call for immediate appointment Plan D = Deltasone - take this if need nebulizer more than rarely  Prednisone 10 mg take  4 each am x 2 days,   2 each am x 2 days,  1 each am x 2 days and stop  Plan E = ER - go to ER or call 911 if all else fails     12/09/2018  f/u ov/Alexander [name] re: GOLD III  Did not bring all  meds / still not clear on ABCD plan  Chief Complaint  Patient presents with  . Follow-up    Patient reports that he is doing ok. He reports sob with alot of exertion. He reports using his rescue inhaler 1-2 times per week.   Dyspnea:  Able to work yard/ weed eating / push mower - "out in yard all day"  Cough: no  Sleeping: ok flat in bed SABA use: once a week = rescue and neb about the same frequency  02: none  rec Plan A = Automatic = Symbicort 80 Take 2 puffs first thing in am and then another 2 puffs about 12 hours later.  Work on Archivist:  Plan B = Backup Only use your albuterol  inhaler as a rescue medication  Plan C = Crisis - only use your albuterol nebulizer if you first try Plan B and it fails to help > ok to use the nebulizer up to every 4 hours but if start needing it regularly call for immediate appointment Plan D = Deltasone (Prednisone)  - take this if need nebulizer more than rarely  Prednisone 10 mg take  4 each am x 2 days,   2 each am x 2 days,  1 each am x 2 days and stop     03/11/2019  f/u ov/Alexander [name] re:  Copd III / ab did not bring meds, has not needed Plan D  Chief Complaint  Patient presents with  . Follow-up    Breathing is doing well. He states he is using his albuterol inhaler daily.   Dyspnea:  Still doing yard work Cough: none  Sleeping: ok flat, two pillows  SABA use: confused with how/ when to use "albuterol inhaler" and actually doesn't have one now per wife as was not refilled   02: none    No obvious day to day or daytime variability or assoc excess/ purulent sputum or mucus plugs or hemoptysis or cp or chest tightness, subjective wheeze or overt sinus or hb symptoms.   Sleeping as above  without nocturnal  or early am exacerbation  of respiratory  c/o's or need for noct saba. Also denies any obvious fluctuation of symptoms with weather or environmental changes or other aggravating or alleviating factors except as outlined above   No unusual exposure hx or h/o childhood pna/ asthma or knowledge of premature birth.  Current Allergies, Complete Past Medical History, Past Surgical History, Family History, and Social History were reviewed in [employer] record.  ROS  The following are not active complaints unless bolded Hoarseness, sore throat, dysphagia, dental problems, itching, sneezing,  nasal congestion or discharge of excess mucus or purulent secretions, ear ache,   fever, chills, sweats, unintended wt loss or wt gain, classically pleuritic or exertional cp,  orthopnea pnd or arm/hand swelling  or leg swelling,  presyncope, palpitations, abdominal pain, anorexia, nausea, vomiting, diarrhea  or change in bowel habits or change in bladder habits, change in stools or change in urine, dysuria, hematuria,  rash, arthralgias, visual complaints, headache, numbness, weakness or ataxia or problems with walking or coordination,  change in mood or  memory.        Current Meds  - NOTE:   Unable to verify as accurately reflecting what pt takes     Medication Sig  . albuterol (PROAIR HFA) 108 (90 Base) MCG/ACT inhaler 2 puffs every 4 hours as needed only  if your can't catch your breath (Patient taking differently: Inhale 2 puffs into the lungs 2 (two) times a day. )  . albuterol (PROVENTIL) (2.5 MG/3ML) 0.083% nebulizer solution Take 3 mLs (2.5 mg total) by nebulization every 6 (six) hours as needed for wheezing or shortness of breath. DX J44.9  . budesonide-formoterol (SYMBICORT) 80-4.5 MCG/ACT inhaler Take 2 puffs first thing in am and then another 2 puffs about 12 hours later.  . citalopram (CELEXA) 10 MG tablet Take 10 mg by mouth daily.  . clotrimazole-betamethasone (LOTRISONE) cream USE AS DIRECTED TWICE PER DAY AS  NEEDED  . famotidine (PEPCID) 40 MG tablet Take 40 mg by mouth at bedtime.   Marland Kitchen lactulose (CHRONULAC) 10 GM/15ML solution TAKE 45 MLS (30 G TOTAL) BY MOUTH 2 (TWO) TIMES DAILY AS NEEDED FOR MILD CONSTIPATION.  Marland Kitchen linaclotide (LINZESS) 145 MCG CAPS capsule Take 1 capsule (145 mcg total) by mouth daily before breakfast.  . memantine (NAMENDA) 10 MG tablet Take 1 tablet (10 mg total) by mouth 2 (two) times daily. (Patient taking differently: Take 5 mg by mouth 2 (two) times daily. )  . tamsulosin (FLOMAX) 0.4 MG CAPS capsule Take 1 capsule (0.4 mg total) by mouth daily.                            Objective:   Physical Exam  03/11/2019    152 12/09/2018      154   09/12/2018     158 03/15/2018    165  02/01/2018    161  01/04/2018      166  10/18/2017      166 07/17/2017       173   06/04/2017       176  05/21/2017       175  05/04/2017       173   03/17/2016    182   02/03/14 177 lb (80.287 kg)  01/27/14 177 lb 12 oz (80.627 kg)  01/06/14 173 lb 8 oz (78.699 kg)     Vital signs reviewed - Note on arrival 02 sats  100% on RA      Report :Full dentures    HEENT : pt wearing mask not removed for exam due to covid - 19 concerns.    NECK :  without JVD/Nodes/TM/ nl carotid upstrokes bilaterally   LUNGS: no acc muscle use,  Mild barrel  contour chest wall with bilateral  Distant bs s audible wheeze and  without cough on insp or exp maneuvers  and mild  Hyperresonant  to  percussion bilaterally     CV:  RRR  no s3 or murmur or increase in P2, and no edema   ABD:  soft and nontender with pos end  insp Hoover's  in the supine position. No bruits or organomegaly appreciated, bowel sounds nl  MS:   Nl gait/  ext warm without deformities, calf tenderness, cyanosis or clubbing No obvious joint restrictions   SKIN: warm and dry without lesions    NEURO:  alert,    no motor or cerebellar deficits apparent.                Assessment & Plan:

## 2019-03-11 NOTE — Patient Instructions (Signed)
Plan A = Automatic = Symbicort 80 Take 2 puffs first thing in am and then another 2 puffs about 12 hours later.   Work on Doctor, hospital technique:  relax and gently blow all the way out then take a nice smooth deep breath back in, triggering the inhaler at same time you start breathing in.  Hold for up to 5 seconds if you can. Blow out thru nose. Rinse and gargle with water when done  Plan B = Backup Only use your albuterol inhaler as a rescue medication to be used if you can't catch your breath by resting or doing a relaxed purse lip breathing pattern.  - The less you use it, the better it will work when you need it. - Ok to use the inhaler up to 2 puffs  every 4 hours if you must but call for appointment if use goes up over your usual need - Don't leave home without it !!  (think of it like the spare tire for your car)   Plan C = Crisis - only use your albuterol nebulizer if you first try Plan B and it fails to help > ok to use the nebulizer up to every 4 hours but if start needing it regularly call for immediate appointment   Plan D = Deltasone (Prednisone)  - take this if need nebulizer more than rarely  Prednisone 10 mg take  4 each am x 2 days,   2 each am x 2 days,  1 each am x 2 days and stop   Plan E = ER - go to ER or call 911 if all else fails     Please schedule a follow up visit in 6 months but call sooner if needed  with all medications /inhalers/ solutions in hand so we can verify exactly what you are taking. This includes all medications from all doctors and over the counters

## 2019-03-12 ENCOUNTER — Encounter: Payer: Self-pay | Admitting: Internal Medicine

## 2019-03-12 ENCOUNTER — Ambulatory Visit (INDEPENDENT_AMBULATORY_CARE_PROVIDER_SITE_OTHER): Payer: PPO | Admitting: Internal Medicine

## 2019-03-12 ENCOUNTER — Other Ambulatory Visit: Payer: Self-pay

## 2019-03-12 VITALS — BP 122/72 | HR 79 | Temp 97.7°F | Ht 63.0 in | Wt 155.0 lb

## 2019-03-12 DIAGNOSIS — R739 Hyperglycemia, unspecified: Secondary | ICD-10-CM | POA: Diagnosis not present

## 2019-03-12 DIAGNOSIS — E785 Hyperlipidemia, unspecified: Secondary | ICD-10-CM

## 2019-03-12 DIAGNOSIS — E559 Vitamin D deficiency, unspecified: Secondary | ICD-10-CM | POA: Diagnosis not present

## 2019-03-12 DIAGNOSIS — F0390 Unspecified dementia without behavioral disturbance: Secondary | ICD-10-CM | POA: Diagnosis not present

## 2019-03-12 DIAGNOSIS — E611 Iron deficiency: Secondary | ICD-10-CM | POA: Diagnosis not present

## 2019-03-12 DIAGNOSIS — I10 Essential (primary) hypertension: Secondary | ICD-10-CM

## 2019-03-12 DIAGNOSIS — E538 Deficiency of other specified B group vitamins: Secondary | ICD-10-CM

## 2019-03-12 MED ORDER — PRAVASTATIN SODIUM 40 MG PO TABS: 40.0000 mg | ORAL_TABLET | Freq: Every day | ORAL | 3 refills | Status: DC
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
## 2019-03-12 NOTE — Patient Instructions (Signed)
Please take all new medication as prescribed - the pravastatin 40 mg per day for high cholesterol  Please continue all other medications as before, and refills have been done if requested.  Please have the pharmacy call with any other refills you may need.  Please continue your efforts at being more active, low cholesterol diet, and weight control.  Please keep your appointments with your specialists as you may have planned  Please return in Feb 2021, or sooner if needed, with Lab testing done 3-5 days before

## 2019-03-16 ENCOUNTER — Encounter: Payer: Self-pay | Admitting: Internal Medicine

## 2019-03-16 NOTE — Assessment & Plan Note (Signed)
stable overall by history and exam, recent data reviewed with pt, and pt to continue medical treatment as before,  to f/u any worsening symptoms or concerns  

## 2019-03-16 NOTE — Assessment & Plan Note (Addendum)
Mild uncontrolled, to add pravastatin 40 qd, o/w stable overall by history and exam, recent data reviewed with pt, and pt to continue medical treatment as before,  to f/u any worsening symptoms or concerns

## 2019-04-14 DIAGNOSIS — L700 Acne vulgaris: Secondary | ICD-10-CM | POA: Diagnosis not present

## 2019-04-14 DIAGNOSIS — L298 Other pruritus: Secondary | ICD-10-CM | POA: Diagnosis not present

## 2019-04-22 DIAGNOSIS — H40033 Anatomical narrow angle, bilateral: Secondary | ICD-10-CM | POA: Diagnosis not present

## 2019-04-22 DIAGNOSIS — H43393 Other vitreous opacities, bilateral: Secondary | ICD-10-CM | POA: Diagnosis not present

## 2019-05-13 ENCOUNTER — Ambulatory Visit: Payer: PPO

## 2019-05-13 DIAGNOSIS — R208 Other disturbances of skin sensation: Secondary | ICD-10-CM | POA: Diagnosis not present

## 2019-05-16 ENCOUNTER — Telehealth (INDEPENDENT_AMBULATORY_CARE_PROVIDER_SITE_OTHER): Payer: PPO | Admitting: Neurology

## 2019-05-16 ENCOUNTER — Ambulatory Visit: Payer: PPO | Attending: Internal Medicine

## 2019-05-16 ENCOUNTER — Other Ambulatory Visit: Payer: Self-pay

## 2019-05-16 ENCOUNTER — Encounter: Payer: Self-pay | Admitting: Neurology

## 2019-05-16 DIAGNOSIS — Z23 Encounter for immunization: Secondary | ICD-10-CM | POA: Insufficient documentation

## 2019-05-16 NOTE — Progress Notes (Signed)
   Covid-19 Vaccination Clinic  Name:  Alexander [name]    MRN: [id number] DOB: 1932-06-22  05/16/2019  [name] was observed post Covid-19 immunization for 15 minutes without incidence. He was provided with Vaccine Information Sheet and instruction to access the V-Safe system.   [name] was instructed to call 911 with any severe reactions post vaccine: Marland Kitchen Difficulty breathing  . Swelling of your face and throat  . A fast heartbeat  . A bad rash all over your body  . Dizziness and weakness    Immunizations Administered    Name Date Dose VIS Date Route   Pfizer COVID-19 Vaccine 05/16/2019  8:45 AM 0.3 mL 04/04/2019 Intramuscular   Manufacturer: [employer], [employer]   Lot: [id number]   NDC: [address]-1

## 2019-05-26 ENCOUNTER — Other Ambulatory Visit: Payer: Self-pay

## 2019-05-26 ENCOUNTER — Encounter: Payer: Self-pay | Admitting: Internal Medicine

## 2019-05-26 ENCOUNTER — Ambulatory Visit (INDEPENDENT_AMBULATORY_CARE_PROVIDER_SITE_OTHER): Payer: PPO | Admitting: Internal Medicine

## 2019-05-26 DIAGNOSIS — J449 Chronic obstructive pulmonary disease, unspecified: Secondary | ICD-10-CM

## 2019-05-26 MED ORDER — FAMOTIDINE 40 MG PO TABS: 40.0000 mg | ORAL_TABLET | Freq: Every day | ORAL | 11 refills | Status: DC

## 2019-05-26 MED ORDER — PANTOPRAZOLE SODIUM 40 MG PO TBEC: 40.0000 mg | DELAYED_RELEASE_TABLET | Freq: Every day | ORAL | 11 refills | Status: DC

## 2019-05-26 MED ORDER — BREZTRI AEROSPHERE 160-9-4.8 MCG/ACT IN AERO: 2.0000 | INHALATION_SPRAY | Freq: Two times a day (BID) | RESPIRATORY_TRACT | 0 refills | Status: DC

## 2019-05-26 MED ORDER — BREZTRI AEROSPHERE 160-9-4.8 MCG/ACT IN AERO: 2.0000 | INHALATION_SPRAY | Freq: Two times a day (BID) | RESPIRATORY_TRACT | 11 refills | Status: DC

## 2019-05-26 NOTE — Progress Notes (Signed)
Subjective:   Patient ID: Alexander [name], male    DOB: 1932/08/28    MRN: [id number]     Brief patient profile:  86  yobm quit smoking 04/2000    gold III COPD     01/09/13  Cardiac evaluation - echo nml  Stress test - peak VO2 68%, limited by ventilation  c. cath [facility]) -11/10 - nml LV fn, nml coronaries  Spirometry >>FEV1 1.14 - 45% -moderate airway obstruction       05/04/2017 acute extended ov/Alexander [name] re:  COPD III  Really not clear what meds he's been taking nor is his wife sure Chief Complaint  Patient presents with  . Acute Visit    productive cough with clear,gray sputum,SOB w/ activity,feels it has improved with the inhaler he uses  daily cough x 3 months, worse in am but all day long and doe = MMRC3 = can't walk 100 yards even at a slow pace at a flat grade s stopping due to sob  - still shops at [facility] but can't get around without resting  Assoc rhinitis/ nasal congestion  Very confused again with meds  rec Symbicort 80  Take 2 puffs first thing in am and then another 2 puffs about 12 hours later.  Pantoprazole (protonix) 40 mg   Take  30-60 min before first meal of the day and Pepcid (famotidine)  20 mg one @  bedtime until return to office Prednisone 10 mg take  4 each am x 2 days,   2 each am x 2 days,  1 each am x 2 days and stop   Augmentin 875 mg take one pill twice daily  X 10 days - take at breakfast and supper with large glass of water.  GERD diet  Work on inhaler technique   Please schedule a follow up office visit in 2 weeks, sooner if needed with all medications / inhalers / solutions    05/21/2017  f/u ov/Alexander [name] re:  COPD GOLD III / main problem is coughing > sob  Chief Complaint  Patient presents with  . Follow-up    Cough is about the same. He has not been looking at what color sputum he is producing. He still has abx left over, he has only been taking 1 tablet daily.   Cough was gone to his satisfaction until 3 days prior to OV  Then acute worse/ min  productive/ still on augmentin  Wife has same "cold"   pt brought symbicort 80 sample well past the red line/ again confused with meds Dspnea:  No change = MMRC3 = can't walk 100 yards even at a slow pace at a flat grade s stopping due to sob  / not using neb saba at all or following action plan  Sleep: disrupted by coughing fits now and has to sleep propped up  rec Plan A = Automatic = symbicort 80 Take 2 puffs first thing in am and then another 2 puffs about 12 hours later.  Work on inhaler technique:   Plan B = Backup Only use your albuterol as a rescue medicatio Best cough medicine > delsym 2 tsp every 12 hours and if still coughing supplement with Tylenol #3 Finish your antibiotics Please see patient coordinator before you leave today  to schedule sinus CT  Please remember to go to the lab department downstairs in the basement  for your tests - we will call you with the results when they are available.    Please  schedule a follow up office visit in 2 weeks,     06/04/2017  f/u ov/Alexander [name] re:  Ab/ sinusitis  Chief Complaint  Patient presents with  . Follow-up    Increased cough with grey sputum since the last visit.   using saba neb several times a day  Dyspnea:  MMRC2 = can't walk a nl pace on a flat grade s sob but does fine slow and flat  Cough: esp after supper variably productive min slt dark mucus Sleep: ok flat   Main complaints are related to nasal congestion   rec No change in medications for now Keep appt to see Dr Annalee Genta > rec FESS> did not do      03/11/2019  f/u ov/Alexander [name] re:  Copd III / ab did not bring meds, has not needed Plan D  Chief Complaint  Patient presents with  . Follow-up    Breathing is doing well. He states he is using his albuterol inhaler daily.   Dyspnea:  Still doing yard work Cough: none  Sleeping: ok flat, two pillows  SABA use: confused with how/ when to use "albuterol inhaler" and actually doesn't have one now per wife as was not refilled     02: none  rec Plan A = Automatic = Symbicort 80 Take 2 puffs first thing in am and then another 2 puffs about 12 hours later.  Work on Archivist:   Plan B = Backup Only use your albuterol inhaler as a rescue medication  Plan C = Crisis - only use your albuterol nebulizer if you first try Plan B and it fails to help > ok to use the nebulizer up to every 4 hours but if start needing it regularly call for immediate appointment Plan D = Deltasone (Prednisone)  - take this if need nebulizer more than rarely  Prednisone 10 mg take  4 each am x 2 days,   2 each am x 2 days,  1 each am x 2 days and stop    05/26/2019  f/u ov/Alexander [name] re: aecopd  Chief Complaint  Patient presents with  . Acute Visit    Pt c/o increased DOE x 2 wks. He also c/o cough with gray sputum.  He is using his proair 1-2 x per day.   Dyspnea:  Around the house / worse off gerd rx "it ran out an no one would refill it"  Cough: minimal am gray mucus  Sleeping: two pillows  SABA use: only 2 x daily hfa even with flare, neb tubing old/ not following action plans x for pred which seems to help some 02: none   No obvious day to day or daytime variability or assoc excess/ purulent sputum or mucus plugs or hemoptysis or cp or chest tightness, subjective wheeze or overt sinus or hb symptoms.   Sleeping  without nocturnal  or early am exacerbation  of respiratory  c/o's or need for noct saba. Also denies any obvious fluctuation of symptoms with weather or environmental changes or other aggravating or alleviating factors except as outlined above   No unusual exposure hx or h/o childhood pna/ asthma or knowledge of premature birth.  Current Allergies, Complete Past Medical History, Past Surgical History, Family History, and Social History were reviewed in [employer] record.  ROS  The following are not active complaints unless bolded Hoarseness, sore throat, dysphagia, dental problems,  itching, sneezing,  nasal congestion or discharge of excess mucus or purulent secretions, ear ache,  fever, chills, sweats, unintended wt loss or wt gain, classically pleuritic or exertional cp,  orthopnea pnd or arm/hand swelling  or leg swelling, presyncope, palpitations, abdominal pain, anorexia, nausea, vomiting, diarrhea  or change in bowel habits or change in bladder habits, change in stools or change in urine, dysuria, hematuria,  rash, arthralgias, visual complaints, headache, numbness, weakness or ataxia or problems with walking or coordination,  change in mood or  memory.        Current Meds - - NOTE:   Unable to verify as accurately reflecting what pt takes     Medication Sig  . albuterol (PROAIR HFA) 108 (90 Base) MCG/ACT inhaler 2 puffs every 4 hours as needed only  if your can't catch your breath  . albuterol (PROVENTIL) (2.5 MG/3ML) 0.083% nebulizer solution Take 3 mLs (2.5 mg total) by nebulization every 6 (six) hours as needed for wheezing or shortness of breath. DX J44.9  . budesonide-formoterol (SYMBICORT) 80-4.5 MCG/ACT inhaler Take 2 puffs first thing in am and then another 2 puffs about 12 hours later.  . clotrimazole-betamethasone (LOTRISONE) cream USE AS DIRECTED TWICE PER DAY AS NEEDED  . famotidine (PEPCID) 40 MG tablet Take 40 mg by mouth at bedtime.   Marland Kitchen lactulose (CHRONULAC) 10 GM/15ML solution TAKE 45 MLS (30 G TOTAL) BY MOUTH 2 (TWO) TIMES DAILY AS NEEDED FOR MILD CONSTIPATION.  Marland Kitchen linaclotide (LINZESS) 145 MCG CAPS capsule Take 1 capsule (145 mcg total) by mouth daily before breakfast.  . memantine (NAMENDA) 10 MG tablet Take 1 tablet (10 mg total) by mouth 2 (two) times daily. (Patient taking differently: Take 5 mg by mouth 2 (two) times daily. )  . pravastatin (PRAVACHOL) 40 MG tablet Take 1 tablet (40 mg total) by mouth daily.  . tamsulosin (FLOMAX) 0.4 MG CAPS capsule Take 1 capsule (0.4 mg total) by mouth daily.                                Objective:    Physical Exam  05/26/2019        159  03/11/2019    152 12/09/2018      154   09/12/2018     158 03/15/2018    165  02/01/2018    161  01/04/2018      166  10/18/2017      166 07/17/2017       173   06/04/2017      176  05/21/2017       175  05/04/2017       173   03/17/2016    182   02/03/14 177 lb (80.287 kg)  01/27/14 177 lb 12 oz (80.627 kg)  01/06/14 173 lb 8 oz (78.699 kg)     Vital signs reviewed  05/26/2019  - Note at rest 02 sats  99% on RA      Report :Full dentures    HEENT : pt wearing mask not removed for exam due to covid - 19 concerns.    NECK :  without JVD/Nodes/TM/ nl carotid upstrokes bilaterally   LUNGS: no acc muscle use,  Mild barrel  contour chest wall with bilateral  Distant bs s audible wheeze and  without cough on insp or exp maneuvers  and mild  Hyperresonant  to  percussion bilaterally     CV:  RRR  no s3 or murmur or increase in P2, and no edema   ABD:  soft and nontender with pos end  insp Hoover's  in the supine position. No bruits or organomegaly appreciated, bowel sounds nl  MS:   Nl gait/  ext warm without deformities, calf tenderness, cyanosis or clubbing No obvious joint restrictions   SKIN: warm and dry without lesions    NEURO:  alert, approp,  Though easily confused with details,   no motor or cerebellar deficits apparent.              Assessment & Plan:

## 2019-05-26 NOTE — Patient Instructions (Addendum)
Plan A = Automatic = Always=    symbicort or Breztri Take 2 puffs first thing in am and then another 2 puffs about 12 hours later.   Pantoprazole (protonix) 40 mg   Take  30-60 min before first meal of the day and Pepcid (famotidine)  40 mg one after supper until return to office - this is the best way to tell whether stomach acid is contributing to your problem.    Plan B = Backup (to supplement plan A, not to replace it) Only use your albuterol inhaler as a rescue medication to be used if you can't catch your breath by resting or doing a relaxed purse lip breathing pattern.  - The less you use it, the better it will work when you need it. - Ok to use the inhaler up to 2 puffs  every 4 hours if you must but call for appointment if use goes up over your usual need - Don't leave home without it !!  (think of it like the spare tire for your car)   Plan C = Crisis (instead of Plan B but only if Plan B stops working) - only use your albuterol nebulizer if you first try Plan B and it fails to help > ok to use the nebulizer up to every 4 hours but if start needing it regularly call for immediate appointment   Plan D = Deltasone = Prednisone  - Prednisone 10 mg take  4 each am x 2 days,   2 each am x 2 days,  1 each am x 2 days and stop    Please schedule a follow up office visit in 4 weeks, sooner if needed  with all medications /inhalers/ solutions in hand so we can verify exactly what you are taking. This includes all medications from all doctors and over the counters

## 2019-05-27 ENCOUNTER — Other Ambulatory Visit: Payer: Self-pay | Admitting: Internal Medicine

## 2019-05-27 ENCOUNTER — Encounter: Payer: Self-pay | Admitting: Internal Medicine

## 2019-05-27 DIAGNOSIS — J449 Chronic obstructive pulmonary disease, unspecified: Secondary | ICD-10-CM

## 2019-05-27 NOTE — Assessment & Plan Note (Addendum)
Quit smoking 2002  Spirometry 09/27/12  FEV1  1.06 (44%) ratio 47  - Spirometry 05/04/2017  FEV1 0.32 (16%)  Ratio 81 but f/v not physiologic   - 05/04/2017    > try symb 80 2bid as cough is the primary concern   - PFT's  07/17/2017  FEV1 1.17 (64 % ) ratio 64  p 20 % improvement from saba p nothing prior to study with DLCO  54 % corrects to 72  % for alv volume   - 09/12/2018 added pred as plan D - 05/26/2019  After extensive coaching inhaler device,  effectiveness =  75% > try breztri 2bid and start back on gerd rx   DDX of  difficult airways management almost all start with A and  include Adherence, Ace Inhibitors, Acid Reflux, Active Sinus Disease, Alpha 1 Antitripsin deficiency, Anxiety masquerading as Airways dz,  ABPA,  Allergy(esp in young), Aspiration (esp in elderly), Adverse effects of meds,  Active smoking or vaping, A bunch of PE's (a small clot burden can't cause this syndrome unless there is already severe underlying pulm or vascular dz with poor reserve) plus two Bs  = Bronchiectasis and Beta blocker use..and one C= CHF   Adherence is always the initial "prime suspect" and is a multilayered concern that requires a "trust but verify" approach in every patient - starting with knowing how to use medications, especially inhalers, correctly, keeping up with refills and understanding the fundamental difference between maintenance and prns vs those medications only taken for a very short course and then stopped and not refilled.  - see hfa teaching above - return with all meds in hand using a trust but verify approach to confirm accurate Medication  Reconciliation The principal here is that until we are certain that the  patients are doing what we've asked, it makes no sense to ask them to do more.   ? Acid (or non-acid) GERD > always difficult to exclude as up to 75% of pts in some series report no assoc GI/ Heartburn symptoms> rec restart  max (24h)  acid suppression and diet restrictions/ reviewed  and instructions given in writing.   ? Allergy/ asthma component > continue prednisone as plan "D"/ try change to [facility] - The goal with a chronic steroid dependent illness is always arriving at the lowest effective dose that controls the disease/symptoms and not accepting a set "formula" which is based on statistics or guidelines that don't always take into account patient  variability or the natural hx of the dz in every individual patient, which may well vary over time.  For now therefore I recommend the patient maintain  Off pred and just 6 day courses prn  ? Active sinus dz > return to shoemker is next step if not improving   ? Anxiety/depression/ cognitive decline all  potential concerns here> defer to PCP          Each maintenance medication was reviewed in detail including emphasizing most importantly the difference between maintenance and prns and under what circumstances the prns are to be triggered using an action plan format where appropriate.  Total time for H and P, chart review, counseling, teaching device and generating customized AVS unique to this office visit / charting = 30 min

## 2019-06-04 ENCOUNTER — Other Ambulatory Visit: Payer: Self-pay | Admitting: Neurology

## 2019-06-05 NOTE — Progress Notes (Signed)
Patient r/s due to not feeling well.

## 2019-06-06 ENCOUNTER — Ambulatory Visit: Payer: PPO | Attending: Internal Medicine

## 2019-06-06 DIAGNOSIS — Z23 Encounter for immunization: Secondary | ICD-10-CM

## 2019-06-06 NOTE — Progress Notes (Signed)
   Covid-19 Vaccination Clinic  Name:  RAHIL PASSEY    MRN: [id number] DOB: [birth year]  06/06/2019  Mr. Hillery was observed post Covid-19 immunization for 15 minutes without incidence. He was provided with Vaccine Information Sheet and instruction to access the V-Safe system.   Mr. Pender was instructed to call 911 with any severe reactions post vaccine: Marland Kitchen Difficulty breathing  . Swelling of your face and throat  . A fast heartbeat  . A bad rash all over your body  . Dizziness and weakness    Immunizations Administered    Name Date Dose VIS Date Route   Pfizer COVID-19 Vaccine 06/06/2019  8:59 AM 0.3 mL 04/04/2019 Intramuscular   Manufacturer: [employer], [employer]   Lot: [id number]   NDC: [address]-7

## 2019-06-12 ENCOUNTER — Ambulatory Visit: Payer: PPO | Admitting: Internal Medicine

## 2019-06-15 ENCOUNTER — Other Ambulatory Visit: Payer: Self-pay | Admitting: Internal Medicine

## 2019-06-17 DIAGNOSIS — N400 Enlarged prostate without lower urinary tract symptoms: Secondary | ICD-10-CM | POA: Diagnosis not present

## 2019-06-17 DIAGNOSIS — K5909 Other constipation: Secondary | ICD-10-CM | POA: Diagnosis not present

## 2019-06-17 DIAGNOSIS — F329 Major depressive disorder, single episode, unspecified: Secondary | ICD-10-CM | POA: Diagnosis not present

## 2019-06-17 DIAGNOSIS — E785 Hyperlipidemia, unspecified: Secondary | ICD-10-CM | POA: Diagnosis not present

## 2019-06-17 DIAGNOSIS — H9193 Unspecified hearing loss, bilateral: Secondary | ICD-10-CM | POA: Diagnosis not present

## 2019-06-17 DIAGNOSIS — G309 Alzheimer's disease, unspecified: Secondary | ICD-10-CM | POA: Diagnosis not present

## 2019-06-17 DIAGNOSIS — J449 Chronic obstructive pulmonary disease, unspecified: Secondary | ICD-10-CM | POA: Diagnosis not present

## 2019-06-17 DIAGNOSIS — K219 Gastro-esophageal reflux disease without esophagitis: Secondary | ICD-10-CM | POA: Diagnosis not present

## 2019-06-17 DIAGNOSIS — I10 Essential (primary) hypertension: Secondary | ICD-10-CM | POA: Diagnosis not present

## 2019-06-17 DIAGNOSIS — Z1331 Encounter for screening for depression: Secondary | ICD-10-CM | POA: Diagnosis not present

## 2019-06-23 ENCOUNTER — Ambulatory Visit: Payer: PPO | Admitting: Internal Medicine

## 2019-07-07 ENCOUNTER — Telehealth: Payer: Self-pay | Admitting: Internal Medicine

## 2019-07-07 MED ORDER — PREDNISONE 10 MG PO TABS: ORAL_TABLET | ORAL | 11 refills | Status: DC

## 2019-07-07 NOTE — Telephone Encounter (Signed)
Should have activated plan D by now then (prednisone)  And if  it's not working needs ov with all meds in hand  If doesn't have any more refills for prednisone ok to refill :  Prednisone 10 mg take  4 each am x 2 days,   2 each am x 2 days,  1 each am x 2 days and stop  X 5 refills

## 2019-07-07 NOTE — Telephone Encounter (Signed)
Spoke with pt's wife, aware of recs.  Refilled prednisone, scheduled ov with MW.  Nothing further needed at this time- will close encounter.

## 2019-07-07 NOTE — Telephone Encounter (Signed)
Called and spoke with pt's wife Alexander [name] who stated 2 days ago pt began having increased SOB.  Pt has had to do a breathing treatment twice a day to help with his breathing. Pt has not had to use the rescue inhaler, just the nebulizer.  Pt denies any complaints of fever.  Pt has complaints of wheezing and cough with clear phlegm.  Pt and Alexander [name] wants recommendations to help with pt's symptoms. Dr. Sherene Sires, please advise on this. Thanks!  Patient Instructions by Nyoka Cowden, MD at 05/26/2019 2:15 PM Author: Nyoka Cowden, MD Author Type: Physician Filed: 05/26/2019 4:09 PM  Note Status: Addendum Cosign: Cosign Not Required Encounter Date: 05/26/2019  Editor: Nyoka Cowden, MD (Physician)  Prior Versions: 1. Nyoka Cowden, MD (Physician) at 05/26/2019 4:05 PM - Addendum   2. Nyoka Cowden, MD (Physician) at 05/26/2019 4:02 PM - Signed    Plan A = Automatic = Always=    symbicort or Breztri Take 2 puffs first thing in am and then another 2 puffs about 12 hours later.   Pantoprazole (protonix) 40 mg   Take  30-60 min before first meal of the day and Pepcid (famotidine)  40 mg one after supper until return to office - this is the best way to tell whether stomach acid is contributing to your problem.    Plan B = Backup (to supplement plan A, not to replace it) Only use your albuterol inhaler as a rescue medication to be used if you can't catch your breath by resting or doing a relaxed purse lip breathing pattern.  - The less you use it, the better it will work when you need it. - Ok to use the inhaler up to 2 puffs  every 4 hours if you must but call for appointment if use goes up over your usual need - Don't leave home without it !!  (think of it like the spare tire for your car)   Plan C = Crisis (instead of Plan B but only if Plan B stops working) - only use your albuterol nebulizer if you first try Plan B and it fails to help > ok to use the nebulizer up to every 4 hours but if start  needing it regularly call for immediate appointment   Plan D = Deltasone = Prednisone  - Prednisone 10 mg take  4 each am x 2 days,   2 each am x 2 days,  1 each am x 2 days and stop    Please schedule a follow up office visit in 4 weeks, sooner if needed  with all medications /inhalers/ solutions in hand so we can verify exactly what you are taking. This includes all medications from all doctors and over the counters

## 2019-07-08 ENCOUNTER — Other Ambulatory Visit: Payer: Self-pay

## 2019-07-08 ENCOUNTER — Ambulatory Visit: Payer: PPO | Admitting: Internal Medicine

## 2019-07-08 ENCOUNTER — Encounter: Payer: Self-pay | Admitting: Internal Medicine

## 2019-07-08 DIAGNOSIS — J449 Chronic obstructive pulmonary disease, unspecified: Secondary | ICD-10-CM | POA: Diagnosis not present

## 2019-07-08 DIAGNOSIS — R05 Cough: Secondary | ICD-10-CM

## 2019-07-08 DIAGNOSIS — R058 Other specified cough: Secondary | ICD-10-CM

## 2019-07-08 MED ORDER — AZITHROMYCIN 250 MG PO TABS: ORAL_TABLET | ORAL | 0 refills | Status: DC

## 2019-07-08 NOTE — Patient Instructions (Addendum)
Plan A = Automatic = Always=    symbicort or Breztri Take 2 puffs first thing in am and then another 2 puffs about 12 hours later.   Continue pantoprazole (protonix) 40 mg   Take  30-60 min before first meal of the day and Pepcid (famotidine)  40 mg one after supper until return to office - this is the best way to tell whether stomach acid is contributing to your problem.    Plan B = Backup (to supplement plan A, not to replace it) Only use your albuterol inhaler as a rescue medication to be used if you can't catch your breath by resting or doing a relaxed purse lip breathing pattern.  - The less you use it, the better it will work when you need it. - Ok to use the inhaler up to 2 puffs  every 4 hours if you must but call for appointment if use goes up over your usual need - Don't leave home without it !!  (think of it like the spare tire for your car)   Plan C = Crisis (instead of Plan B but only if Plan B stops working) - only use your albuterol nebulizer if you first try Plan B and it fails to help > ok to use the nebulizer up to every 4 hours but if start needing it regularly call for immediate appointment   Plan D = Deltasone = Prednisone  - Prednisone 10 mg take  4 each am x 2 days,   2 each am x 2 days,  1 each am x 2 days and stop    zpak    Keep your may appt

## 2019-07-08 NOTE — Progress Notes (Signed)
Subjective:   Patient ID: Alexander [name], male    DOB: 1932/08/28    MRN: [id number]     Brief patient profile:  86  yobm quit smoking 04/2000    gold III COPD     01/09/13  Cardiac evaluation - echo nml  Stress test - peak VO2 68%, limited by ventilation  c. cath Claiborne Billings) -11/10 - nml LV fn, nml coronaries  Spirometry >>FEV1 1.14 - 45% -moderate airway obstruction       05/04/2017 acute extended ov/Alexander [name] re:  COPD III  Really not clear what meds he's been taking nor is his wife sure Chief Complaint  Patient presents with  . Acute Visit    productive cough with clear,gray sputum,SOB w/ activity,feels it has improved with the inhaler he uses  daily cough x 3 months, worse in am but all day long and doe = MMRC3 = can't walk 100 yards even at a slow pace at a flat grade s stopping due to sob  - still shops at [facility] but can't get around without resting  Assoc rhinitis/ nasal congestion  Very confused again with meds  rec Symbicort 80  Take 2 puffs first thing in am and then another 2 puffs about 12 hours later.  Pantoprazole (protonix) 40 mg   Take  30-60 min before first meal of the day and Pepcid (famotidine)  20 mg one @  bedtime until return to office Prednisone 10 mg take  4 each am x 2 days,   2 each am x 2 days,  1 each am x 2 days and stop   Augmentin 875 mg take one pill twice daily  X 10 days - take at breakfast and supper with large glass of water.  GERD diet  Work on inhaler technique   Please schedule a follow up office visit in 2 weeks, sooner if needed with all medications / inhalers / solutions    05/21/2017  f/u ov/Alexander [name] re:  COPD GOLD III / main problem is coughing > sob  Chief Complaint  Patient presents with  . Follow-up    Cough is about the same. He has not been looking at what color sputum he is producing. He still has abx left over, he has only been taking 1 tablet daily.   Cough was gone to his satisfaction until 3 days prior to OV  Then acute worse/ min  productive/ still on augmentin  Wife has same "cold"   pt brought symbicort 80 sample well past the red line/ again confused with meds Dspnea:  No change = MMRC3 = can't walk 100 yards even at a slow pace at a flat grade s stopping due to sob  / not using neb saba at all or following action plan  Sleep: disrupted by coughing fits now and has to sleep propped up  rec Plan A = Automatic = symbicort 80 Take 2 puffs first thing in am and then another 2 puffs about 12 hours later.  Work on inhaler technique:   Plan B = Backup Only use your albuterol as a rescue medicatio Best cough medicine > delsym 2 tsp every 12 hours and if still coughing supplement with Tylenol #3 Finish your antibiotics Please see patient coordinator before you leave today  to schedule sinus CT  Please remember to go to the lab department downstairs in the basement  for your tests - we will call you with the results when they are available.    Please  schedule a follow up office visit in 2 weeks,     06/04/2017  f/u ov/Alexander [name] re:  Ab/ sinusitis  Chief Complaint  Patient presents with  . Follow-up    Increased cough with grey sputum since the last visit.   using saba neb several times a day  Dyspnea:  MMRC2 = can't walk a nl pace on a flat grade s sob but does fine slow and flat  Cough: esp after supper variably productive min slt dark mucus Sleep: ok flat   Main complaints are related to nasal congestion   rec No change in medications for now Keep appt to see Dr Annalee Genta > rec FESS> did not do      03/11/2019  f/u ov/Alexander [name] re:  Copd III / ab did not bring meds, has not needed Plan D  Chief Complaint  Patient presents with  . Follow-up    Breathing is doing well. He states he is using his albuterol inhaler daily.   Dyspnea:  Still doing yard work Cough: none  Sleeping: ok flat, two pillows  SABA use: confused with how/ when to use "albuterol inhaler" and actually doesn't have one now per wife as was not refilled     02: none  rec Plan A = Automatic = Symbicort 80 Take 2 puffs first thing in am and then another 2 puffs about 12 hours later.  Work on Archivist:   Plan B = Backup Only use your albuterol inhaler as a rescue medication  Plan C = Crisis - only use your albuterol nebulizer if you first try Plan B and it fails to help > ok to use the nebulizer up to every 4 hours but if start needing it regularly call for immediate appointment Plan D = Deltasone (Prednisone)  - take this if need nebulizer more than rarely  Prednisone 10 mg take  4 each am x 2 days,   2 each am x 2 days,  1 each am x 2 days and stop    05/26/2019  f/u ov/Alexander [name] re: aecopd  Chief Complaint  Patient presents with  . Acute Visit    Pt c/o increased DOE x 2 wks. He also c/o cough with gray sputum.  He is using his proair 1-2 x per day.   Dyspnea:  Around the house / worse off gerd rx "it ran out and no one would refill it"  Cough: minimal am gray mucus  Sleeping: two pillows  SABA use: only 2 x daily hfa even with flare, neb tubing old/ not following action plans x for pred which seems to help some Rec Plan A = Automatic = Always=    symbicort or Breztri Take 2 puffs first thing in am and then another 2 puffs about 12 hours later.  Pantoprazole (protonix) 40 mg   Take  30-60 min before first meal of the day and Pepcid (famotidine)  40 mg one after supper until return to office - this is the best way to tell whether stomach acid is contributing to your problem.   Plan B = Backup (to supplement plan A, not to replace it) Only use your albuterol inhaler as a rescue medication Plan C = Crisis (instead of Plan B but only if Plan B stops working) - only use your albuterol nebulizer if you first try Plan B and it fails to help > ok to use the nebulizer up to every 4 hours but if start needing it regularly call for immediate  appointment Plan D = Deltasone = Prednisone  - Prednisone 10 mg take  4 each am x 2 days,   2  each am x 2 days,  1 each am x 2 days and stop  Please schedule a follow up office visit in 4 weeks, sooner if needed  with all medications /inhalers/ solutions in hand so we can verify exactly what you are taking. This includes all medications from all doctors and over the counters   07/08/2019  Acute ov/Alexander [name] re:all better until  worse sob/ cough/ wheeze / second shot  06/06/19  Chief Complaint  Patient presents with  . Acute Visit    Breathing worse x 4 days, wheezing, cough with green/gray sputum.    Dyspnea: increase since 07/05/19 with cough/ wheeze minimally discolored sputuem   Sleeping: worse since onset of sob/ bed flat on 2 pillows  SABA use: hfa and neb but not following action plan written / reviewed t last ov  02: none    No obvious patternns in  day to day or daytime variability or assoc frankly  purulent sputum or mucus plugs or hemoptysis or cp or chest tightness, subjective wheeze or overt sinus or hb symptoms.    Also denies any obvious fluctuation of symptoms with weather or environmental changes or other aggravating or alleviating factors except as outlined above   No unusual exposure hx or h/o childhood pna/ asthma or knowledge of premature birth.  Current Allergies, Complete Past Medical History, Past Surgical History, Family History, and Social History were reviewed in [employer] record.  ROS  The following are not active complaints unless bolded Hoarseness, sore throat, dysphagia, dental problems, itching, sneezing,  nasal congestion or discharge of excess mucus or purulent secretions, ear ache,   fever, chills, sweats, unintended wt loss or wt gain, classically pleuritic or exertional cp,  orthopnea pnd or arm/hand swelling  or leg swelling, presyncope, palpitations, abdominal pain, anorexia, nausea, vomiting, diarrhea  or change in bowel habits or change in bladder habits, change in stools or change in urine, dysuria, hematuria,  rash,  arthralgias, visual complaints, headache, numbness, weakness or ataxia or problems with walking or coordination,  change in mood or  memory.        Current Meds  Medication Sig  . albuterol (PROAIR HFA) 108 (90 Base) MCG/ACT inhaler 2 puffs every 4 hours as needed only  if your can't catch your breath  . albuterol (PROVENTIL) (2.5 MG/3ML) 0.083% nebulizer solution Take 3 mLs (2.5 mg total) by nebulization every 6 (six) hours as needed for wheezing or shortness of breath. DX J44.9  . budesonide-formoterol (SYMBICORT) 80-4.5 MCG/ACT inhaler Inhale 2 puffs into the lungs 2 (two) times daily.  Marland Kitchen donepezil (ARICEPT) 5 MG tablet Take 5 mg by mouth at bedtime.  . famotidine (PEPCID) 40 MG tablet Take 1 tablet (40 mg total) by mouth daily.  . Isopropyl Alcohol (SWIMMERS EAR DROPS OT) Place in ear(s) as needed.  . lactulose (CHRONULAC) 10 GM/15ML solution TAKE 45 MLS (30 G TOTAL) BY MOUTH 2 (TWO) TIMES DAILY AS NEEDED FOR MILD CONSTIPATION.  Marland Kitchen linaclotide (LINZESS) 145 MCG CAPS capsule Take 1 capsule (145 mcg total) by mouth daily before breakfast.  . memantine (NAMENDA) 10 MG tablet TAKE 1 TABLET BY MOUTH TWICE A DAY  . pantoprazole (PROTONIX) 40 MG tablet Take 1 tablet (40 mg total) by mouth daily. Take 30-60 min before first meal of the day  . predniSONE (DELTASONE) 10 MG tablet Take  4 each am x 2 days,   2 each am x 2 days,  1 each am x 2 days and stop  . tamsulosin (FLOMAX) 0.4 MG CAPS capsule Take 1 capsule (0.4 mg total) by mouth daily.                                    Objective:   Physical Exam  07/08/2019      155  05/26/2019        159  03/11/2019    152 12/09/2018      154   09/12/2018     158 03/15/2018    165  02/01/2018    161  01/04/2018      166  10/18/2017      166 07/17/2017       173   06/04/2017      176  05/21/2017       175  05/04/2017       173   03/17/2016    182   02/03/14 177 lb (80.287 kg)  01/27/14 177 lb 12 oz (80.627 kg)  01/06/14 173 lb 8 oz (78.699 kg)      Vital signs reviewed  07/08/2019  - Note at rest 02 sats  98% on RA     Report :Full dentures   HEENT : pt wearing mask not removed for exam due to covid - 19 concerns.    NECK :  without JVD/Nodes/TM/ nl carotid upstrokes bilaterally   LUNGS: no acc muscle use,  Mild barrel  contour chest wall with bilateral  Distant  Exp wheeze and  without cough on insp or exp maneuvers  and mild  Hyperresonant  to  percussion bilaterally     CV:  RRR  no s3 or murmur or increase in P2, and no edema   ABD:  soft and nontender with pos end  insp Hoover's  in the supine position. No bruits or organomegaly appreciated, bowel sounds nl  MS:   Nl gait/  ext warm without deformities, calf tenderness, cyanosis or clubbing No obvious joint restrictions   SKIN: warm and dry without lesions    NEURO:  alert, approp, nl sensorium with  no motor or cerebellar deficits apparent.       I personally reviewed images and agree with radiology impression as follows:      pCXR 07/09/19 No active disease.    Assessment & Plan:

## 2019-07-09 ENCOUNTER — Encounter (HOSPITAL_COMMUNITY): Payer: Self-pay | Admitting: Emergency Medicine

## 2019-07-09 ENCOUNTER — Other Ambulatory Visit: Payer: Self-pay

## 2019-07-09 ENCOUNTER — Emergency Department (HOSPITAL_COMMUNITY)
Admission: EM | Admit: 2019-07-09 | Discharge: 2019-07-09 | Disposition: A | Discharge status: Home / Self Care | Payer: PPO | Attending: Emergency Medicine | Admitting: Emergency Medicine

## 2019-07-09 ENCOUNTER — Ambulatory Visit: Payer: PPO | Admitting: Neurology

## 2019-07-09 ENCOUNTER — Emergency Department (HOSPITAL_COMMUNITY): Payer: PPO

## 2019-07-09 DIAGNOSIS — Z87891 Personal history of nicotine dependence: Secondary | ICD-10-CM | POA: Diagnosis not present

## 2019-07-09 DIAGNOSIS — Z79899 Other long term (current) drug therapy: Secondary | ICD-10-CM | POA: Insufficient documentation

## 2019-07-09 DIAGNOSIS — I10 Essential (primary) hypertension: Secondary | ICD-10-CM | POA: Diagnosis not present

## 2019-07-09 DIAGNOSIS — J441 Chronic obstructive pulmonary disease with (acute) exacerbation: Secondary | ICD-10-CM

## 2019-07-09 DIAGNOSIS — I2510 Atherosclerotic heart disease of native coronary artery without angina pectoris: Secondary | ICD-10-CM | POA: Diagnosis not present

## 2019-07-09 DIAGNOSIS — J45909 Unspecified asthma, uncomplicated: Secondary | ICD-10-CM | POA: Diagnosis not present

## 2019-07-09 DIAGNOSIS — Z20822 Contact with and (suspected) exposure to covid-19: Secondary | ICD-10-CM | POA: Insufficient documentation

## 2019-07-09 DIAGNOSIS — R062 Wheezing: Secondary | ICD-10-CM | POA: Diagnosis not present

## 2019-07-09 DIAGNOSIS — Z743 Need for continuous supervision: Secondary | ICD-10-CM | POA: Diagnosis not present

## 2019-07-09 DIAGNOSIS — R0602 Shortness of breath: Secondary | ICD-10-CM | POA: Diagnosis not present

## 2019-07-09 DIAGNOSIS — R0689 Other abnormalities of breathing: Secondary | ICD-10-CM | POA: Diagnosis not present

## 2019-07-09 LAB — CBC WITH DIFFERENTIAL/PLATELET
Abs Immature Granulocytes: 0.01 10*3/uL (ref 0.00–0.07)
Basophils Absolute: 0 10*3/uL (ref 0.0–0.1)
Basophils Relative: 1 %
Eosinophils Absolute: 0.1 10*3/uL (ref 0.0–0.5)
Eosinophils Relative: 2 %
HCT: 42 % (ref 39.0–52.0)
Hemoglobin: 13.3 g/dL (ref 13.0–17.0)
Immature Granulocytes: 0 %
Lymphocytes Relative: 11 %
Lymphs Abs: 0.5 10*3/uL — ABNORMAL LOW (ref 0.7–4.0)
MCH: 31.2 pg (ref 26.0–34.0)
MCHC: 31.7 g/dL (ref 30.0–36.0)
MCV: 98.6 fL (ref 80.0–100.0)
Monocytes Absolute: 0.1 10*3/uL (ref 0.1–1.0)
Monocytes Relative: 3 %
Neutro Abs: 3.6 10*3/uL (ref 1.7–7.7)
Neutrophils Relative %: 83 %
Platelets: 294 10*3/uL (ref 150–400)
RBC: 4.26 MIL/uL (ref 4.22–5.81)
RDW: 12.3 % (ref 11.5–15.5)
WBC: 4.3 10*3/uL (ref 4.0–10.5)
nRBC: 0 % (ref 0.0–0.2)

## 2019-07-09 LAB — TROPONIN I (HIGH SENSITIVITY): Troponin I (High Sensitivity): 11 ng/L (ref <18)

## 2019-07-09 LAB — BASIC METABOLIC PANEL
Anion gap: 8 (ref 5–15)
BUN: 16 mg/dL (ref 8–23)
CO2: 27 mmol/L (ref 22–32)
Calcium: 9.4 mg/dL (ref 8.9–10.3)
Chloride: 107 mmol/L (ref 98–111)
Creatinine, Ser: 1.09 mg/dL (ref 0.61–1.24)
GFR calc Af Amer: >60 mL/min (ref >60)
GFR calc non Af Amer: >60 mL/min (ref >60)
Glucose, Bld: 111 mg/dL — ABNORMAL HIGH (ref 70–99)
Potassium: 4.1 mmol/L (ref 3.5–5.1)
Sodium: 142 mmol/L (ref 135–145)

## 2019-07-09 LAB — POC SARS CORONAVIRUS 2 AG -  ED: SARS Coronavirus 2 Ag: NEGATIVE

## 2019-07-09 LAB — BRAIN NATRIURETIC PEPTIDE: B Natriuretic Peptide: 66.9 pg/mL (ref 0.0–100.0)

## 2019-07-09 LAB — SARS CORONAVIRUS 2 (TAT 6-24 HRS): SARS Coronavirus 2: NEGATIVE

## 2019-07-09 MED ORDER — METHYLPREDNISOLONE SODIUM SUCC 125 MG IJ SOLR
125.0000 mg | Freq: Once | INTRAMUSCULAR | Status: AC
  Administered 2019-07-09: 125 mg via INTRAVENOUS
  Filled 2019-07-09: qty 2

## 2019-07-09 MED ORDER — ALBUTEROL SULFATE HFA 108 (90 BASE) MCG/ACT IN AERS
6.0000 | INHALATION_SPRAY | Freq: Once | RESPIRATORY_TRACT | Status: AC
  Administered 2019-07-09: 6 via RESPIRATORY_TRACT
  Filled 2019-07-09: qty 6.7

## 2019-07-09 MED ORDER — IPRATROPIUM-ALBUTEROL 0.5-2.5 (3) MG/3ML IN SOLN
3.0000 mL | Freq: Once | RESPIRATORY_TRACT | Status: AC
  Administered 2019-07-09: 3 mL via RESPIRATORY_TRACT
  Filled 2019-07-09: qty 3

## 2019-07-09 NOTE — Discharge Instructions (Addendum)
You have been diagnosed today with COPD Exacerbation.  At this time there does not appear to be the presence of an emergent medical condition, however there is always the potential for conditions to change. Please read and follow the below instructions.  Please return to the Emergency Department immediately for any new or worsening symptoms. Please be sure to follow up with your Primary Care Provider within one week regarding your visit today; please call their office to schedule an appointment even if you are feeling better for a follow-up visit. Please take your prednisone and Azithromycin as prescribed by your pulmonologist. Please continue taking your albuterol nebulizers regularly. Your COVID test is currently pending. Please check your results on your MyChart account in the next 1-2 days. Discuss the results immediately with your Primary Care Provider and Pulmonologist.  Call your pulmonologist today to schedule a follow-up appointment.  Get help right away if: You have shortness of breath while resting. You have shortness of breath that stops you from: Being able to talk. Doing normal activities. Your chest hurts for longer than 5 minutes. Your skin color is more blue than usual. Your pulse oximeter shows that you have low oxygen for longer than 5 minutes. You have a fever. You feel too tired to breathe normally. You have any new/concerning or worsening of symptoms  Please read the additional information packets attached to your discharge summary.  Do not take your medicine if  develop an itchy rash, swelling in your mouth or lips, or difficulty breathing; call 911 and seek immediate emergency medical attention if this occurs.  Note: Portions of this text may have been transcribed using voice recognition software. Every effort was made to ensure accuracy; however, inadvertent computerized transcription errors may still be present.
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
## 2019-07-09 NOTE — ED Notes (Signed)
Patient ambulated about 20 ft before feeling short of breath. O2 sats dropped as low as 85%.

## 2019-07-09 NOTE — ED Triage Notes (Addendum)
Patient presents from home with shortness of breath and tachypnea. He reported being lightheaded to EMS. Inhaler was used with no success. EMS reports expiratory wheezes in the upper lungs. Patient was seen by his pulmonologist and started on prednisone 2 days ago. EMS noted that while seated the patient's O2 sat was at 100%. His O2 sat dropped to 70% while walking to the EMS truck. EMS placed him on 4 L O2. HX. COPD , Dementia    EMS vitals: 90 HR 22 Resp Rate Sinus rhythm  100% O2 sat on 4L 140/70 BP

## 2019-07-10 ENCOUNTER — Telehealth: Payer: PPO | Admitting: Neurology

## 2019-07-10 ENCOUNTER — Encounter: Payer: Self-pay | Admitting: Internal Medicine

## 2019-07-10 NOTE — Assessment & Plan Note (Signed)
Quit smoking 2002  Spirometry 09/27/12  FEV1  1.06 (44%) ratio 47  - Spirometry 05/04/2017  FEV1 0.32 (16%)  Ratio 81 but f/v not physiologic   - 05/04/2017    > try symb 80 2bid as cough is the primary concern   - PFT's  07/17/2017  FEV1 1.17 (64 % ) ratio 64  p 20 % improvement from saba p nothing prior to study with DLCO  54 % corrects to 72  % for alv volume   - 09/12/2018 added pred as plan D - 05/26/2019  After extensive coaching inhaler device,  effectiveness =  75% > try breztri 2bid and start back on gerd rx   Acute exac AB pattern and very easily confused with details of care.   rec  - The proper method of use, as well as anticipated side effects, of a metered-dose inhaler are discussed and demonstrated to the patient. Improved effectiveness after extensive coaching during this visit to a level of approximately 75 % from a baseline of 50 % (short ti) > ok to continue either symbicort or breztri and favor latter if he can acquire it on his insurance   Extensive review of action plan physically matching up each plan with the meds he uses in ABCD format emphasizing that BCD do not replace but rather supplement A  -see avs for instructions unique to this ov  Also added zpak.    ER is Plan E          Each maintenance medication was reviewed in detail including emphasizing most importantly the difference between maintenance and prns and under what circumstances the prns are to be triggered using an action plan format where appropriate.  Total time for H and P, chart review, counseling, teaching device and generating customized AVS unique to this acute  office visit / charting = 30 min

## 2019-07-10 NOTE — Assessment & Plan Note (Signed)
Allergy profile 05/21/2017 >  Eos 0.2 /  IgE  15 RAST neg -  Sinus CT 05/29/2017 1. Bilateral maxillary sinusitis with fluid levels. 2. Chronic left sphenoid sinusitis with mild mucosal thickening > f/u shoemaker: ov 07/09/17 c/w persistent sinusitis > rec FESS> pt canceled  - repeat Sinus CT 02/14/2018 > Bilateral maxillary sinus disease seen on the most recent CT scan has markedly improved. There is mild residual mucosal thickening in the inferior maxillary sinuses bilaterally. Mild mucosal thickening in the sphenoid sinuses is greater on the left and has increased since the prior study.  rec continue PPI/ reviewed use of flutter valve.

## 2019-07-11 ENCOUNTER — Telehealth: Payer: Self-pay | Admitting: Internal Medicine

## 2019-07-11 NOTE — Telephone Encounter (Signed)
I called and spoke with the pt's spouse, [name]  She states that the pt went to ED via EMS after waking up 07/09/2019 with increased SOB  She states he was not admitted and went home same day  He just finished a zpack today and is still on prednisone taper  His breathing has improved some and he is coughing less since recent ov this wk  She is checking his o2 with a pulse ox that she purchased and asked what number his o2 sat should be  I advised that she should call us or seek emergent care if sats are below 90%  At the time I was speaking with her he was at 97% ra  I advised to call Monday if not improving to his satisfaction and will get him in for ov or televisit  Will forward to MW to make him aware

## 2019-08-13 ENCOUNTER — Other Ambulatory Visit: Payer: Self-pay

## 2019-08-13 ENCOUNTER — Encounter (HOSPITAL_COMMUNITY): Payer: Self-pay

## 2019-08-13 ENCOUNTER — Ambulatory Visit (HOSPITAL_COMMUNITY)
Admission: EM | Admit: 2019-08-13 | Discharge: 2019-08-13 | Disposition: A | Discharge status: Home / Self Care | Payer: PPO | Attending: Family Medicine | Admitting: Family Medicine

## 2019-08-13 DIAGNOSIS — K5900 Constipation, unspecified: Secondary | ICD-10-CM

## 2019-08-13 MED ORDER — LACTULOSE 10 GM/15ML PO SOLN: 30.0000 g | Freq: Two times a day (BID) | ORAL | 5 refills | Status: DC | PRN

## 2019-08-13 MED ORDER — LACTULOSE 10 GM/15ML PO SOLN: 20.0000 g | Freq: Two times a day (BID) | ORAL | 5 refills | Status: DC | PRN

## 2019-08-13 NOTE — ED Triage Notes (Addendum)
Patient reports shortness of breath since Saturday. Unable to tell if this is similar to his normal COPD flare ups. Reports he has received both of his COVID-19 vaccinations.

## 2019-08-13 NOTE — Discharge Instructions (Addendum)
Take the lactulose up to twice a day as needed for constipation.  I have refilled this for you. Keep taking the Linzess daily If the lactulose does not work in the next couple days with the increase in twice a day you can start taking MiraLAX daily.  Make sure you are drinking plenty of water.
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
## 2019-09-01 ENCOUNTER — Other Ambulatory Visit: Payer: Self-pay

## 2019-09-01 ENCOUNTER — Encounter: Payer: Self-pay | Admitting: Neurology

## 2019-09-01 ENCOUNTER — Ambulatory Visit: Payer: PPO | Admitting: Neurology

## 2019-09-01 VITALS — BP 144/80 | HR 105 | Ht 63.0 in | Wt 151.0 lb

## 2019-09-01 DIAGNOSIS — F0390 Unspecified dementia without behavioral disturbance: Secondary | ICD-10-CM | POA: Diagnosis not present

## 2019-09-01 DIAGNOSIS — F03B0 Unspecified dementia, moderate, without behavioral disturbance, psychotic disturbance, mood disturbance, and anxiety: Secondary | ICD-10-CM

## 2019-09-01 MED ORDER — MEMANTINE HCL 10 MG PO TABS: ORAL_TABLET | ORAL | 3 refills | Status: DC

## 2019-09-01 NOTE — Patient Instructions (Signed)
1. Continue Memantine 10mg : Take 1/2 tablet twice a day  2. Continue the medication Dr. prescribed, the Donepezil 5mg  daily  3. Ask Dr. Eloise Harman if stomach medication is still needed  4. Monitor driving, have wife present at all times, drive short distances only. If problems arise even with short distances, would stop driving  5. Follow-up in 6 months, call for any changes  FALL PRECAUTIONS: Be cautious when walking. Scan the area for obstacles that may increase the risk of trips and falls. When getting up in the mornings, sit up at the edge of the bed for a few minutes before getting out of bed. Consider elevating the bed at the head end to avoid drop of blood pressure when getting up. Walk always in a well-lit room (use night lights in the walls). Avoid area rugs or power cords from appliances in the middle of the walkways. Use a walker or a cane if necessary and consider physical therapy for balance exercise. Get your eyesight checked regularly.   HOME SAFETY: Consider the safety of the kitchen when operating appliances like stoves, microwave oven, and blender. Consider having supervision and share cooking responsibilities until no longer able to participate in those. Accidents with firearms and other hazards in the house should be identified and addressed as well.  DRIVING: Regarding driving, in patients with progressive memory problems, driving will be impaired. We advise to have someone else do the driving if trouble finding directions or if minor accidents are reported. Independent driving assessment is available to determine safety of driving.  ABILITY TO BE LEFT ALONE: If patient is unable to contact 911 operator, consider using LifeLine, or when the need is there, arrange for someone to stay with patients. Smoking is a fire hazard, consider supervision or cessation. Risk of wandering should be assessed by caregiver and if detected at any point, supervision and safe proof  recommendations should be instituted.   RECOMMENDATIONS FOR ALL PATIENTS WITH MEMORY PROBLEMS: 1. Continue to exercise (Recommend 30 minutes of walking everyday, or 3 hours every week) 2. Increase social interactions - continue going to [place] and enjoy social gatherings with friends and family 3. Eat healthy, avoid fried foods and eat more fruits and vegetables 4. Maintain adequate blood pressure, blood sugar, and blood cholesterol level. Reducing the risk of stroke and cardiovascular disease also helps promoting better memory. 5. Avoid stressful situations. Live a simple life and avoid aggravations. Organize your time and prepare for the next day in anticipation. 6. Sleep well, avoid any interruptions of sleep and avoid any distractions in the bedroom that may interfere with adequate sleep quality 7. Avoid sugar, avoid sweets as there is a strong link between excessive sugar intake, diabetes, and cognitive impairment The Mediterranean diet has been shown to help patients reduce the risk of progressive memory disorders and reduces cardiovascular risk. This includes eating fish, eat fruits and green leafy vegetables, nuts like almonds and hazelnuts, walnuts, and also use olive oil. Avoid fast foods and fried foods as much as possible. Avoid sweets and sugar as sugar use has been linked to worsening of memory function.  There is always a concern of gradual progression of memory problems. If this is the case, then we may need to adjust level of care according to patient needs. Support, both to the patient and caregiver, should then be put into place.

## 2019-09-01 NOTE — Progress Notes (Signed)
NEUROLOGY FOLLOW UP OFFICE NOTE  Alexander [name] [id number] 1933/03/13  HISTORY OF PRESENT ILLNESS: I had the pleasure of seeing Alexander [name] in follow-up in the neurology clinic on 09/01/2019. He is again accompanied by his wife who helps supplement the history today.   The patient was last seen a year ago for dementia. MOCA blind (done over phone) in 08/2018 was 10/22. He is on Memantine 5mg  BID (higher dose caused dizziness) and Donepezil 5mg  daily, which he is tolerating better. His wife states his memory is not too good. He continues to drive and denies getting lost. His wife manages medications and finances. He denies any headaches, vision changes, focal numbness/tingling/weakness, no falls. Sleep and appetite are good. No paranoia or hallucinations.   HPI: This is a pleasant 86 yo RH man with a history of hypertension, hyperlipidemia,with worsening memory loss. When asked about his memory, he states "there ain't none." He noticed memory changes in his late 13s, he would forget names, however recently he would be talking about something and forget what he was talking about. He misplaces things frequently. He got lost driving one time. He was picking his wife up one time and did not know where he was (15 years ago). He occasionally forgets to take his medications and has to be told repeatedly to take it. He has noticed word-finding difficulties. No problems performing ADLs independently. He lives with his wife.  His daughter has noticed changes more in the past year or so. She would tell him something then the next minute he forgets. He asks the same questions repeatedly. At the end of the visit, his daughter walked out to speak with me personally, and stated that her father underreported symptoms, and that she and her mother have been concerned about his driving. She did not want to mention this in front of him because he would get upset. She denies any paranoia. He states he sees things moving  around especially at night, like shadows, sometimes he thinks something is crawling around on the fence. He has constipation.  His father had memory loss.   I personally reviewed MRI brain done in 01/2014 which showed prominent mineral deposition in the globus pallidus, midbrain, and dentate nucleus. This may be normal but has been described in Parkinson's like syndromes. Moderate chronic microvascular change.    PAST MEDICAL HISTORY: Past Medical History:  Diagnosis Date  . Asthma   . Constipation   . COPD (chronic obstructive pulmonary disease) (HCC)    chronic dyspnea  . GERD (gastroesophageal reflux disease)   . Hemorrhoids   . HYPERLIPIDEMIA   . Hypertension   . Hypertensive heart disease   . LVH (left ventricular hypertrophy)    a. 2014 Echo: EF 65-70%, no rwma, Gr1 DD, mild MR.  . Non-obstructive CAD (coronary artery disease)    a. 02/2009 Cath: essentially nl cors; b. 07/2014 MV: mild diaph atten, no ischemia-->low risk.    MEDICATIONS: Current Outpatient Medications on File Prior to Visit  Medication Sig Dispense Refill  . albuterol (PROAIR HFA) 108 (90 Base) MCG/ACT inhaler 2 puffs every 4 hours as needed only  if your can't catch your breath (Patient taking differently: Inhale 2 puffs into the lungs every 4 (four) hours as needed for wheezing or shortness of breath. 2 puffs every 4 hours as needed only  if your can't catch your breath) 18 g 11  . albuterol (PROVENTIL) (2.5 MG/3ML) 0.083% nebulizer solution Take 3 mLs (2.5 mg total) by nebulization  every 6 (six) hours as needed for wheezing or shortness of breath. DX J44.9 150 mL 3  . budesonide-formoterol (SYMBICORT) 80-4.5 MCG/ACT inhaler Inhale 2 puffs into the lungs 2 (two) times daily.    Marland Kitchen donepezil (ARICEPT) 5 MG tablet Take 5 mg by mouth at bedtime.    . famotidine (PEPCID) 40 MG tablet Take 1 tablet (40 mg total) by mouth daily. 30 tablet 11  . lactulose (CHRONULAC) 10 GM/15ML solution Take 30 mLs (20 g total) by  mouth 2 (two) times daily as needed for mild constipation. 240 mL 5  . linaclotide (LINZESS) 145 MCG CAPS capsule Take 1 capsule (145 mcg total) by mouth daily before breakfast. 90 capsule 3  . memantine (NAMENDA) 10 MG tablet TAKE 1 TABLET BY MOUTH TWICE A DAY (Patient taking differently: Take 10 mg by mouth 2 (two) times daily. ) 60 tablet 11  . pantoprazole (PROTONIX) 40 MG tablet Take 1 tablet (40 mg total) by mouth daily. Take 30-60 min before first meal of the day 30 tablet 11  . triamcinolone cream (KENALOG) 0.1 % Apply 1 application topically 2 (two) times daily as needed (itching).     No current facility-administered medications on file prior to visit.    ALLERGIES: No Known Allergies  FAMILY HISTORY: Family History  Problem Relation Age of Onset  . Pancreatic cancer Mother 85  . Hypertension Mother   . Aneurysm Father 74       brain  . Prostate cancer Brother        x 2 bro    SOCIAL HISTORY: Social History   Socioeconomic History  . Marital status: Married    Spouse name: Not on file  . Number of children: 8  . Years of education: 8  . Highest education level: Not on file  Occupational History  . Occupation: RETIRED    Employer: RETIRED    Comment: MACHINE OPERATOR  Tobacco Use  . Smoking status: Former Smoker    Packs/day: 0.25    Years: 48.00    Pack years: 12.00    Types: Cigarettes    Quit date: 04/24/2000    Years since quitting: 19.3  . Smokeless tobacco: Never Used  Substance and Sexual Activity  . Alcohol use: Not Currently  . Drug use: No  . Sexual activity: Yes  Other Topics Concern  . Not on file  Social History Narrative   EXERCISES INTERMITTENTLY AT THE GYM      5 GRANDCHILDREN      2 GREAT-CHILDREN            WEARS GLASSES      Lives in one story home with wife.   Social Determinants of Health   Financial Resource Strain:   . Difficulty of Paying Living Expenses:   Food Insecurity:   . Worried About Programme researcher, broadcasting/film/video in  the Last Year:   . Barista in the Last Year:   Transportation Needs:   . Freight forwarder (Medical):   Marland Kitchen Lack of Transportation (Non-Medical):   Physical Activity:   . Days of Exercise per Week:   . Minutes of Exercise per Session:   Stress:   . Feeling of Stress :   Social Connections:   . Frequency of Communication with Friends and Family:   . Frequency of Social Gatherings with Friends and Family:   . Attends Religious Services:   . Active Member of Clubs or Organizations:   . Attends Club or  Organization Meetings:   Marland Kitchen Marital Status:   Intimate Partner Violence:   . Fear of Current or Ex-Partner:   . Emotionally Abused:   Marland Kitchen Physically Abused:   . Sexually Abused:     REVIEW OF SYSTEMS: Constitutional: No fevers, chills, or sweats, no generalized fatigue, change in appetite Eyes: No visual changes, double vision, eye pain Ear, nose and throat: No hearing loss, ear pain, nasal congestion, sore throat Cardiovascular: No chest pain, palpitations Respiratory:  No shortness of breath at rest or with exertion, wheezes GastrointestinaI: No nausea, vomiting, diarrhea, abdominal pain, fecal incontinence Genitourinary:  No dysuria, urinary retention or frequency Musculoskeletal:  No neck pain, back pain Integumentary: No rash, pruritus, skin lesions Neurological: as above Psychiatric: No depression, insomnia, anxiety Endocrine: No palpitations, fatigue, diaphoresis, mood swings, change in appetite, change in weight, increased thirst Hematologic/Lymphatic:  No anemia, purpura, petechiae. Allergic/Immunologic: no itchy/runny eyes, nasal congestion, recent allergic reactions, rashes  PHYSICAL EXAM: Vitals:   09/01/19 1517  BP: (!) 144/80  Pulse: (!) 105  SpO2: 91%   General: No acute distress Head:  Normocephalic/atraumatic Skin/Extremities: No rash, no edema Neurological Exam: alert and oriented to person, place, did not know year (20?). No aphasia or  dysarthria. Fund of knowledge is appropriate.  Recent and remote memory are impaired.  Attention and concentration are reduced. SLUMS score 6/30  [employer] Mental Exam 09/01/2019  Weekday Correct 1  Current year 0  What state are we in? 1  Amount spent 0  Amount left 0  # of Animals 1  5 objects recall 0  Number series 0  Hour markers 0  Time correct 0  Placed X in triangle correctly 0  Largest Figure 1  Name of male 2  Date back to work 0  Type of work 0  State she lived in 0  Total score 6    Cranial nerves: Pupils equal, round, reactive to light. Extraocular movements intact with no nystagmus. Visual fields full. No facial asymmetry. Motor: Bulk and tone normal, muscle strength 5/5 throughout with no pronator drift. Finger to nose testing intact.  Gait narrow-based and steady, able to tandem walk adequately.    IMPRESSION: This is an 86 yo RH man with vascular risk factors including hypertension, hyperlipidemia, and moderate dementia, likely vascular. MRI brain showed moderate chronic microvascular disease. SLUMS score today 6/30. He is on Donepezil 5mg  daily and Memantine 5mg  BID (did not tolerate higher dose). We discussed driving, he should have his wife present at all times and drive short distances. If she starts having difficulties with this, recommend no further driving. They are asking about his GI medications, advised to speak to his PCP. Follow-up in 6 months, call for any changes.   Thank you for allowing me to participate in his care.  Please do not hesitate to call for any questions or concerns.   Ellouise Newer, M.D.   CC: Dr. Philip Aspen

## 2019-09-03 ENCOUNTER — Ambulatory Visit: Payer: PPO | Admitting: Physician Assistant

## 2019-09-04 DIAGNOSIS — I10 Essential (primary) hypertension: Secondary | ICD-10-CM | POA: Diagnosis not present

## 2019-09-04 DIAGNOSIS — K5909 Other constipation: Secondary | ICD-10-CM | POA: Diagnosis not present

## 2019-09-04 DIAGNOSIS — J449 Chronic obstructive pulmonary disease, unspecified: Secondary | ICD-10-CM | POA: Diagnosis not present

## 2019-09-04 DIAGNOSIS — G309 Alzheimer's disease, unspecified: Secondary | ICD-10-CM | POA: Diagnosis not present

## 2019-09-05 ENCOUNTER — Other Ambulatory Visit: Payer: Self-pay | Admitting: Internal Medicine

## 2019-09-05 ENCOUNTER — Encounter: Payer: Self-pay | Admitting: General Practice

## 2019-09-05 ENCOUNTER — Other Ambulatory Visit (HOSPITAL_COMMUNITY): Payer: Self-pay | Admitting: Internal Medicine

## 2019-09-05 ENCOUNTER — Encounter: Payer: Self-pay | Admitting: Gastroenterology

## 2019-09-05 DIAGNOSIS — K5900 Constipation, unspecified: Secondary | ICD-10-CM

## 2019-09-05 NOTE — Telephone Encounter (Signed)
disregard opened in error

## 2019-09-08 ENCOUNTER — Ambulatory Visit: Payer: PPO | Admitting: Internal Medicine
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
## 2019-09-10 ENCOUNTER — Other Ambulatory Visit: Payer: Self-pay

## 2019-09-10 ENCOUNTER — Ambulatory Visit (INDEPENDENT_AMBULATORY_CARE_PROVIDER_SITE_OTHER): Payer: PPO | Admitting: General Practice

## 2019-09-10 ENCOUNTER — Encounter: Payer: Self-pay | Admitting: General Practice

## 2019-09-10 VITALS — BP 138/72 | HR 94 | Ht 63.0 in | Wt 147.8 lb

## 2019-09-10 DIAGNOSIS — E7800 Pure hypercholesterolemia, unspecified: Secondary | ICD-10-CM | POA: Diagnosis not present

## 2019-09-10 DIAGNOSIS — I110 Hypertensive heart disease with heart failure: Secondary | ICD-10-CM

## 2019-09-10 DIAGNOSIS — J42 Unspecified chronic bronchitis: Secondary | ICD-10-CM | POA: Diagnosis not present

## 2019-09-10 NOTE — Patient Instructions (Signed)
Medication Instructions:  The current medical regimen is effective;  continue present plan and medications as directed. Please refer to the Current Medication list given to you today. *If you need a refill on your cardiac medications before your next appointment, please call your pharmacy*  Special Instructions PLEASE READ AND FOLLOW SALTY 6-ATTACHED  Follow-Up: Your next appointment:  12 month(s) In Person with Nicki Guadalajara, MD  At [facility], you and your health needs are our priority.  As part of our continuing mission to provide you with exceptional heart care, we have created designated Provider Care Teams.  These Care Teams include your primary Cardiologist (physician) and Advanced Practice Providers (APPs -  Physician Assistants and Nurse Practitioners) who all work together to provide you with the care you need, when you need it.

## 2019-09-11 ENCOUNTER — Ambulatory Visit (HOSPITAL_COMMUNITY)
Admission: RE | Admit: 2019-09-11 | Discharge: 2019-09-11 | Disposition: A | Discharge status: Home / Self Care | Payer: PPO | Source: Ambulatory Visit | Attending: Internal Medicine | Admitting: Internal Medicine

## 2019-09-11 DIAGNOSIS — K5900 Constipation, unspecified: Secondary | ICD-10-CM | POA: Diagnosis not present

## 2019-09-11 DIAGNOSIS — K5730 Diverticulosis of large intestine without perforation or abscess without bleeding: Secondary | ICD-10-CM | POA: Diagnosis not present

## 2019-09-15 ENCOUNTER — Ambulatory Visit: Payer: PPO | Admitting: Internal Medicine

## 2019-10-09 ENCOUNTER — Telehealth: Payer: Self-pay | Admitting: Gastroenterology

## 2019-10-09 ENCOUNTER — Ambulatory Visit: Payer: PPO | Admitting: Gastroenterology

## 2019-10-09 NOTE — Telephone Encounter (Signed)
Patient had an appointment today and was 40 minutes late. Unfortunately, we couldn't see him. Mrs Melendrez has been informed that there are no other openings on the schedule at this time. He has been placed on the cancellation list. I also made her aware, that Mr Maland is an established patient with Dr Loreta Ave, However she states that Mr Mccaughan only wants to see a male provider, I did offer to schedule with an APP here at our office. This was declined due to the providers not being males. They will await scheduled office visit.

## 2019-11-05 ENCOUNTER — Encounter: Payer: Self-pay | Admitting: Gastroenterology

## 2019-11-05 ENCOUNTER — Ambulatory Visit (INDEPENDENT_AMBULATORY_CARE_PROVIDER_SITE_OTHER): Payer: PPO | Admitting: Gastroenterology

## 2019-11-05 VITALS — BP 120/70 | HR 88 | Ht 63.75 in | Wt 146.0 lb

## 2019-11-05 DIAGNOSIS — K5900 Constipation, unspecified: Secondary | ICD-10-CM | POA: Diagnosis not present

## 2019-11-05 MED ORDER — LINACLOTIDE 72 MCG PO CAPS: 72.0000 ug | ORAL_CAPSULE | Freq: Every day | ORAL | 2 refills | Status: DC

## 2019-11-05 NOTE — Patient Instructions (Signed)
If you are age 84 or older, your body mass index should be between 23-30. Your Body mass index is 25.26 kg/m. If this is out of the aforementioned range listed, please consider follow up with your Primary Care Provider.  If you are age 28 or younger, your body mass index should be between 19-25. Your Body mass index is 25.26 kg/m. If this is out of the aformentioned range listed, please consider follow up with your Primary Care Provider.   We have sent the following medications to your pharmacy for you to pick up at your convenience: Linzess 72 mcg take one capsule daily.  You have been scheduled for an MRI at [facility] (1st floor radiology) on Friday 11/14/19. Your appointment time is 8 pm. Please arrive 15 minutes prior to your appointment time for registration purposes. Please make certain not to have anything to eat or drink 4 hours prior to your test. In addition, if you have any metal in your body, have a pacemaker or defibrillator, please be sure to let your ordering physician know. This test typically takes 45 minutes to 1 hour to complete. Should you need to reschedule, please call [phone] to do so.
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
## 2019-11-10 ENCOUNTER — Ambulatory Visit (HOSPITAL_COMMUNITY)
Admission: RE | Admit: 2019-11-10 | Discharge: 2019-11-10 | Disposition: A | Discharge status: Home / Self Care | Payer: PPO | Source: Ambulatory Visit | Attending: Gastroenterology | Admitting: Gastroenterology

## 2019-11-10 ENCOUNTER — Other Ambulatory Visit: Payer: Self-pay

## 2019-11-10 DIAGNOSIS — K5900 Constipation, unspecified: Secondary | ICD-10-CM | POA: Insufficient documentation

## 2019-11-10 DIAGNOSIS — N400 Enlarged prostate without lower urinary tract symptoms: Secondary | ICD-10-CM | POA: Diagnosis not present

## 2019-11-10 DIAGNOSIS — N3289 Other specified disorders of bladder: Secondary | ICD-10-CM | POA: Diagnosis not present

## 2019-11-10 DIAGNOSIS — N442 Benign cyst of testis: Secondary | ICD-10-CM | POA: Diagnosis not present

## 2019-11-10 MED ORDER — GADOBUTROL 1 MMOL/ML IV SOLN
6.0000 mL | Freq: Once | INTRAVENOUS | Status: AC | PRN
  Administered 2019-11-10: 6 mL via INTRAVENOUS

## 2019-11-12 NOTE — Progress Notes (Signed)
____________________________________________________________  Attending physician addendum:  Thank you for sending this case to me. I have reviewed the entire note.  Difficult to tell given limited history. Low clinical suspicion for fistula since none on exam, so I think MRI pelvis likely low yield.   Also, often limited quality from motion artifact in elderly patients with dementia.  CTAP with oral and IV contrast would be better. If unrevealing,most likely needs colonoscopy.  Amada Jupiter, MD  ____________________________________________________________

## 2019-11-14 ENCOUNTER — Ambulatory Visit (HOSPITAL_COMMUNITY): Admission: RE | Admit: 2019-11-14 | Payer: PPO | Source: Ambulatory Visit

## 2019-12-15 DIAGNOSIS — H16223 Keratoconjunctivitis sicca, not specified as Sjogren's, bilateral: Secondary | ICD-10-CM | POA: Diagnosis not present

## 2019-12-15 DIAGNOSIS — H401231 Low-tension glaucoma, bilateral, mild stage: Secondary | ICD-10-CM | POA: Diagnosis not present

## 2019-12-15 DIAGNOSIS — H02005 Unspecified entropion of left lower eyelid: Secondary | ICD-10-CM | POA: Diagnosis not present

## 2019-12-15 DIAGNOSIS — H35353 Cystoid macular degeneration, bilateral: Secondary | ICD-10-CM | POA: Diagnosis not present

## 2019-12-16 ENCOUNTER — Other Ambulatory Visit: Payer: Self-pay | Admitting: Internal Medicine

## 2019-12-17 ENCOUNTER — Other Ambulatory Visit: Payer: Self-pay

## 2019-12-17 ENCOUNTER — Ambulatory Visit
Admission: EM | Admit: 2019-12-17 | Discharge: 2019-12-17 | Disposition: A | Discharge status: Home / Self Care | Payer: PPO | Attending: Physician Assistant | Admitting: Physician Assistant

## 2019-12-17 DIAGNOSIS — K5909 Other constipation: Secondary | ICD-10-CM | POA: Diagnosis not present

## 2019-12-17 MED ORDER — LACTULOSE 10 GM/15ML PO SOLN: 10.0000 g | Freq: Every day | ORAL | 0 refills | Status: DC | PRN

## 2019-12-17 NOTE — Discharge Instructions (Signed)
You can try short course of lactulose as needed. Otherwise, will need to follow up with your GI doctor for further evaluation. If having abdominal pain, vomiting, not passing gas go to the emergency department for further evaluation.
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
## 2019-12-17 NOTE — ED Triage Notes (Signed)
Pt c/o "bloating" not able to go to the "bathroom easily" and has to use Linzess to have BM. Pt states he had small BM this morning with very small hard stools. BM yesterday also hard of small amount. Pt desire to eat decreasing 2/2 bloating. Denies fever, chills, dysuria sx, n/v.

## 2019-12-24 ENCOUNTER — Telehealth: Payer: Self-pay | Admitting: Gastroenterology

## 2019-12-24 NOTE — Telephone Encounter (Signed)
I spoke with the pt's wife and she states that the pt is having small hard stools while taking lactulose 1 tablespoon daily and linzess 72 mcg daily.  She asked if she can add 1-2 doses of miralax daily and see if that helps.  She was advised that she can try the miralax and if that does not help to call back and we can ask if he can increase his linzess.  She does not want to add any more linzess at this time.  She will call back with an update.

## 2019-12-24 NOTE — Telephone Encounter (Signed)
Patients wife called states the patient is having a hard time with BM for a few weeks now and they are seeking advise.

## 2020-01-02 DIAGNOSIS — K5909 Other constipation: Secondary | ICD-10-CM | POA: Diagnosis not present

## 2020-01-02 DIAGNOSIS — J449 Chronic obstructive pulmonary disease, unspecified: Secondary | ICD-10-CM | POA: Diagnosis not present

## 2020-01-02 DIAGNOSIS — K219 Gastro-esophageal reflux disease without esophagitis: Secondary | ICD-10-CM | POA: Diagnosis not present

## 2020-01-02 DIAGNOSIS — E441 Mild protein-calorie malnutrition: Secondary | ICD-10-CM | POA: Diagnosis not present

## 2020-01-02 DIAGNOSIS — I10 Essential (primary) hypertension: Secondary | ICD-10-CM | POA: Diagnosis not present

## 2020-01-02 DIAGNOSIS — G309 Alzheimer's disease, unspecified: Secondary | ICD-10-CM | POA: Diagnosis not present

## 2020-01-02 DIAGNOSIS — E785 Hyperlipidemia, unspecified: Secondary | ICD-10-CM | POA: Diagnosis not present

## 2020-01-07 DIAGNOSIS — H02005 Unspecified entropion of left lower eyelid: Secondary | ICD-10-CM | POA: Diagnosis not present

## 2020-01-07 DIAGNOSIS — H02035 Senile entropion of left lower eyelid: Secondary | ICD-10-CM | POA: Diagnosis not present

## 2020-01-09 ENCOUNTER — Ambulatory Visit: Payer: PPO | Admitting: Internal Medicine

## 2020-01-15 DIAGNOSIS — R82998 Other abnormal findings in urine: Secondary | ICD-10-CM | POA: Diagnosis not present

## 2020-01-20 DIAGNOSIS — H02035 Senile entropion of left lower eyelid: Secondary | ICD-10-CM | POA: Diagnosis not present

## 2020-01-27 ENCOUNTER — Telehealth: Payer: Self-pay | Admitting: Internal Medicine

## 2020-01-27 MED ORDER — AZITHROMYCIN 250 MG PO TABS: ORAL_TABLET | ORAL | 0 refills | Status: DC

## 2020-01-27 MED ORDER — PREDNISONE 10 MG PO TABS: ORAL_TABLET | ORAL | 0 refills | Status: DC

## 2020-01-27 NOTE — Telephone Encounter (Signed)
Hard to treat this over the phone but at least he's vaccinated for covid  Would prefer he be seen but if can't come in ok to offer zpak Prednisone 10 mg take  4 each am x 2 days,   2 each am x 2 days,  1 each am x 2 days and stop

## 2020-01-27 NOTE — Telephone Encounter (Signed)
Spoke with pt's wife, [name]. States that pt has been complaining of increased shortness of breath and runny nose. This started 2-3 days ago. [name] states that it's hard to get the pt to tell her how he is feeling due to his dementia. Denies having issues with chest tightness, wheezing, coughing or fever. [name] would like Dr. Thurston Hole recommendations on what to do to help the pt.  MW - please advise. Thanks.

## 2020-01-27 NOTE — Telephone Encounter (Signed)
Spoke with pt's wife, aware of recs.  rx' sent to preferred pharmacy.  Nothing further needed at this time- will close encounter.

## 2020-02-08 ENCOUNTER — Other Ambulatory Visit: Payer: Self-pay | Admitting: Gastroenterology

## 2020-02-09 NOTE — Telephone Encounter (Signed)
Shanda Bumps- Can you please clarify for me what medication patient SHOULD be taking for his constipation? It appears per Dr Myrtie Neither' request (See imaging result note 11/10/19) that he should be on miralax daily rather than linzess. HOWEVER, it appears patient is taking linzess, miralax and *possibly* addition of lactulose for constipation. I just want to make sure I am refilling medication according to correct orders.   Thanks.

## 2020-02-11 NOTE — Telephone Encounter (Signed)
It looks like I said that Linzess 145 mcg caused him diarrhea so I do not know that he was really even taking it.  Dr. Myrtie Neither recommended MiraLAX daily so I would stick with the MiraLAX.  The patient was very confused and hard to understand/decipher what his complaints and symptoms were.  His wife was not helpful with providing information either.

## 2020-02-11 NOTE — Telephone Encounter (Signed)
Sorry, it looks like I lowered the Linzess to 72 mcg, but Dr. Myrtie Neither wanted him just doing the MiraLAX instead.  So I would say no Linzess, just Miralax for now.

## 2020-02-12 ENCOUNTER — Ambulatory Visit: Payer: PPO | Admitting: Neurology

## 2020-02-23 ENCOUNTER — Telehealth: Payer: Self-pay | Admitting: Internal Medicine

## 2020-02-23 NOTE — Telephone Encounter (Signed)
Spoke with the pt's spouse  She states that the pt is having a lot of nasal congestion but then runny nose with clear nasal drainage  He also c/o cough with green sputum and stated that this started a month ago  No increased SOB, f/c/s, and he has had covid vaccine  Please advise They are okay with a call back tomorrow

## 2020-02-23 NOTE — Telephone Encounter (Signed)
Prednisone 10 mg take  4 each am x 2 days,   2 each am x 2 days,  1 each am x 2 days and resume prior rx if on maint prednisone  Augmentin 875 mg take one pill twice daily  X 10 days - take at breakfast and supper with large glass of water.  It would help reduce the usual side effects (diarrhea and yeast infections) if you ate cultured yogurt at lunch.

## 2020-02-24 MED ORDER — AMOXICILLIN-POT CLAVULANATE 875-125 MG PO TABS: 1.0000 | ORAL_TABLET | Freq: Two times a day (BID) | ORAL | 0 refills | Status: DC

## 2020-02-24 MED ORDER — PREDNISONE 10 MG PO TABS: ORAL_TABLET | ORAL | 0 refills | Status: DC

## 2020-02-24 NOTE — Telephone Encounter (Signed)
Rxs sent to pharm and I spoke with pt's spouse and notified her  Nothing further needed

## 2020-03-09 ENCOUNTER — Telehealth: Payer: Self-pay | Admitting: Internal Medicine

## 2020-03-09 NOTE — Telephone Encounter (Signed)
Fine with me - in meantime can try For nasal  drainage / throat tickle try take CHLORPHENIRAMINE  4 mg  (Chlortab 4mg   at should be easiest to find in the green box)  take one every 4 hours as needed - available over the counter- may cause drowsiness so start with just a bedtime dose to see if  tolerates it before trying in daytime

## 2020-03-09 NOTE — Telephone Encounter (Signed)
Spoke to patient's spouse, Ruth(DPR) and relayed below message. [name] voiced her understanding and had no further questions.  Appointment scheduled for 03/30/2020 at [facility] office. [name] prefers [place] office. 11/30/221 appt was at [place] office.  Nothing further needed at this time.

## 2020-03-09 NOTE — Telephone Encounter (Signed)
If he's no better vs prior to rx then nothing else to rec s ov with NP or televisit with NP  if can't make it to ov   If he got better then regressed ok to repeat Prednisone 10 mg take  4 each am x 2 days,   2 each am x 2 days,  1 each am x 2 days and stop

## 2020-03-09 NOTE — Telephone Encounter (Signed)
Called and spoke to patient's spouse, Ruth(DPR).  Patient reports of runny nose and occasional dry cough . Nasal drainage is clear in color. Patient was prescribed Augmentin and prednisone on 11/02/021. He completed course over the weekend. Sx are baseline since completing course.  Denied fever, chills or sweats. Not using any nasal salines.  He has had both covid vaccines. He has not has flu shot yet but plans to do so.   Dr. Sherene Sires, please advise. Thanks  Current Outpatient Medications on File Prior to Visit  Medication Sig Dispense Refill   albuterol (PROAIR HFA) 108 (90 Base) MCG/ACT inhaler 2 puffs every 4 hours as needed only  if your can't catch your breath (Patient taking differently: Inhale 2 puffs into the lungs every 4 (four) hours as needed for wheezing or shortness of breath. 2 puffs every 4 hours as needed only  if your can't catch your breath) 18 g 11   albuterol (PROVENTIL) (2.5 MG/3ML) 0.083% nebulizer solution TAKE 3 MLS BY NEBULIZATION EVERY 6 HOURS AS NEEDED FOR WHEEZING OR SHORTNESS OF BREATH. 150 mL 3   amoxicillin-clavulanate (AUGMENTIN) 875-125 MG tablet Take 1 tablet by mouth 2 (two) times daily. 20 tablet 0   azithromycin (ZITHROMAX) 250 MG tablet Take 2 tablets today, then 1 daily until gone. 6 tablet 0   budesonide-formoterol (SYMBICORT) 80-4.5 MCG/ACT inhaler Inhale 2 puffs into the lungs 2 (two) times daily.     donepezil (ARICEPT) 5 MG tablet Take 5 mg by mouth at bedtime.     lactulose (CONSTULOSE) 10 GM/15ML solution Take 15 mLs (10 g total) by mouth daily as needed for mild constipation. 30 mL 0   linaclotide (LINZESS) 72 MCG capsule Take 1 capsule (72 mcg total) by mouth daily before breakfast. 30 capsule 2   memantine (NAMENDA) 10 MG tablet Take 1/2 tablet twice a day 90 tablet 3   predniSONE (DELTASONE) 10 MG tablet 40mg X2 days, 20mg  X2 days, 10mg X2 days, then stop. 14 tablet 0   predniSONE (DELTASONE) 10 MG tablet 4 x 2 days, 2 x 2 days, 1 x 2 days  then stop 20 tablet 0   triamcinolone cream (KENALOG) 0.1 % Apply 1 application topically 2 (two) times daily as needed (itching).     No current facility-administered medications on file prior to visit.    No Known Allergies

## 2020-03-09 NOTE — Telephone Encounter (Signed)
Patient's spouse, [name] [facility]). [name] stated that patient's nasal drainage is slightly better but his nose is just "pouring water". Patient would like to be seen.  Patient prefers to see Dr. Sherene Sires only. First available with Dr. Sherene Sires is not under 03/23/20. patient is okay with waiting until then.  Dr. Sherene Sires, please advise.  Thanks

## 2020-03-17 ENCOUNTER — Other Ambulatory Visit: Payer: Self-pay

## 2020-03-17 ENCOUNTER — Encounter: Payer: Self-pay | Admitting: Neurology

## 2020-03-17 ENCOUNTER — Ambulatory Visit: Payer: PPO | Admitting: Neurology

## 2020-03-17 VITALS — BP 143/80 | HR 74 | Resp 20 | Ht 66.0 in | Wt 143.0 lb

## 2020-03-17 DIAGNOSIS — F03B0 Unspecified dementia, moderate, without behavioral disturbance, psychotic disturbance, mood disturbance, and anxiety: Secondary | ICD-10-CM

## 2020-03-17 DIAGNOSIS — F0390 Unspecified dementia without behavioral disturbance: Secondary | ICD-10-CM | POA: Diagnosis not present

## 2020-03-17 NOTE — Progress Notes (Signed)
NEUROLOGY FOLLOW UP OFFICE NOTE  Alexander [name] [id number] 07/28/1932  HISTORY OF PRESENT ILLNESS: I had the pleasure of seeing Alexander [name] in follow-up in the neurology clinic on 03/17/2020.  The patient was last seen 6 months ago for moderate dementia without behavioral disturbance. He is again accompanied by his wife who helps supplement the history today.  Records and images were personally reviewed where available.  SLUMS score 6/30 in 08/2019. He is on Donepezil 5mg  daily. He states his memory is not too good. He manages his own medications and denies missing doses. He and his wife report that he has not been taking the Memantine. He continues to drive and denies getting lost, his wife denies any issues, she rides with him all the time. His wife manages finances. He is independent with dressing and bathing. He remains active, doing yard work regularly. He denies any headaches. His left ear feels clogged up. No dizziness, vision changes, focal numbness/tingling/weakness, no falls. Sleep is good. No hallucinations or paranoia.    HPI: This is a pleasant 86 yo RH man with a history of hypertension, hyperlipidemia,with worsening memory loss. When asked about his memory, he states "there ain't none." He noticed memory changes in his late 46s, he would forget names, however recently he would be talking about something and forget what he was talking about. He misplaces things frequently. He got lost driving one time. He was picking his wife up one time and did not know where he was (15 years ago). He occasionally forgets to take his medications and has to be told repeatedly to take it. He has noticed word-finding difficulties. No problems performing ADLs independently. He lives with his wife.  His daughter has noticed changes more in the past year or so. She would tell him something then the next minute he forgets. He asks the same questions repeatedly. At the end of the visit, his daughter walked  out to speak with me personally, and stated that her father underreported symptoms, and that she and her mother have been concerned about his driving. She did not want to mention this in front of him because he would get upset. She denies any paranoia. He states he sees things moving around especially at night, like shadows, sometimes he thinks something is crawling around on the fence. He has constipation.  His father had memory loss.   I personally reviewed MRI brain done in 01/2014 which showed prominent mineral deposition in the globus pallidus, midbrain, and dentate nucleus. This may be normal but has been described in Parkinson's like syndromes. Moderate chronic microvascular change.    PAST MEDICAL HISTORY: Past Medical History:  Diagnosis Date  . Alzheimers disease (HCC)    mild  . Anxiety   . Asthma   . BPH (benign prostatic hyperplasia)   . CAD (coronary artery disease)   . Constipation   . COPD (chronic obstructive pulmonary disease) (HCC)    chronic dyspnea  . Depression   . GERD (gastroesophageal reflux disease)   . Hearing loss   . Hemorrhoids   . HYPERLIPIDEMIA   . Hypertension   . Hypertensive heart disease   . LVH (left ventricular hypertrophy)    a. 2014 Echo: EF 65-70%, no rwma, Gr1 DD, mild MR.  . Non-obstructive CAD (coronary artery disease)    a. 02/2009 Cath: essentially nl cors; b. 07/2014 MV: mild diaph atten, no ischemia-->low risk.    MEDICATIONS: Current Outpatient Medications on File Prior to Visit  Medication  Sig Dispense Refill  . albuterol (PROAIR HFA) 108 (90 Base) MCG/ACT inhaler 2 puffs every 4 hours as needed only  if your can't catch your breath (Patient taking differently: Inhale 2 puffs into the lungs every 4 (four) hours as needed for wheezing or shortness of breath. 2 puffs every 4 hours as needed only  if your can't catch your breath) 18 g 11  . albuterol (PROVENTIL) (2.5 MG/3ML) 0.083% nebulizer solution TAKE 3 MLS BY NEBULIZATION EVERY 6  HOURS AS NEEDED FOR WHEEZING OR SHORTNESS OF BREATH. 150 mL 3  . amoxicillin-clavulanate (AUGMENTIN) 875-125 MG tablet Take 1 tablet by mouth 2 (two) times daily. 20 tablet 0  . azithromycin (ZITHROMAX) 250 MG tablet Take 2 tablets today, then 1 daily until gone. 6 tablet 0  . budesonide-formoterol (SYMBICORT) 80-4.5 MCG/ACT inhaler Inhale 2 puffs into the lungs 2 (two) times daily.    Marland Kitchen donepezil (ARICEPT) 5 MG tablet Take 5 mg by mouth at bedtime.    Marland Kitchen lactulose (CONSTULOSE) 10 GM/15ML solution Take 15 mLs (10 g total) by mouth daily as needed for mild constipation. 30 mL 0  . linaclotide (LINZESS) 72 MCG capsule Take 1 capsule (72 mcg total) by mouth daily before breakfast. 30 capsule 2  . memantine (NAMENDA) 10 MG tablet Take 1/2 tablet twice a day 90 tablet 3  . predniSONE (DELTASONE) 10 MG tablet 40mg X2 days, 20mg  X2 days, 10mg X2 days, then stop. 14 tablet 0  . predniSONE (DELTASONE) 10 MG tablet 4 x 2 days, 2 x 2 days, 1 x 2 days then stop 20 tablet 0  . triamcinolone cream (KENALOG) 0.1 % Apply 1 application topically 2 (two) times daily as needed (itching).     No current facility-administered medications on file prior to visit.    ALLERGIES: No Known Allergies  FAMILY HISTORY: Family History  Problem Relation Age of Onset  . Pancreatic cancer Mother 59  . Hypertension Mother   . Aneurysm Father 74       brain  . Stroke Father   . Hypertension Father   . Hyperlipidemia Father   . Prostate cancer Brother        x 2 bro    SOCIAL HISTORY: Social History   Socioeconomic History  . Marital status: Married    Spouse name: Not on file  . Number of children: 8  . Years of education: 8  . Highest education level: Not on file  Occupational History  . Occupation: RETIRED    Employer: RETIRED    Comment: MACHINE OPERATOR  Tobacco Use  . Smoking status: Former Smoker    Packs/day: 0.25    Years: 48.00    Pack years: 12.00    Types: Cigarettes    Quit date: 04/24/2000     Years since quitting: 19.9  . Smokeless tobacco: Never Used  Vaping Use  . Vaping Use: Never used  Substance and Sexual Activity  . Alcohol use: Not Currently  . Drug use: No  . Sexual activity: Yes  Other Topics Concern  . Not on file  Social History Narrative   EXERCISES INTERMITTENTLY AT THE GYM      5 GRANDCHILDREN      2 GREAT-CHILDREN            WEARS GLASSES      Lives in one story home with wife.      Right handed   Social Determinants of Health   Financial Resource Strain:   . Difficulty of Paying  Living Expenses: Not on file  Food Insecurity:   . Worried About Programme researcher, broadcasting/film/video in the Last Year: Not on file  . Ran Out of Food in the Last Year: Not on file  Transportation Needs:   . Lack of Transportation (Medical): Not on file  . Lack of Transportation (Non-Medical): Not on file  Physical Activity:   . Days of Exercise per Week: Not on file  . Minutes of Exercise per Session: Not on file  Stress:   . Feeling of Stress : Not on file  Social Connections:   . Frequency of Communication with Friends and Family: Not on file  . Frequency of Social Gatherings with Friends and Family: Not on file  . Attends Religious Services: Not on file  . Active Member of Clubs or Organizations: Not on file  . Attends Banker Meetings: Not on file  . Marital Status: Not on file  Intimate Partner Violence:   . Fear of Current or Ex-Partner: Not on file  . Emotionally Abused: Not on file  . Physically Abused: Not on file  . Sexually Abused: Not on file     PHYSICAL EXAM: Vitals:   03/17/20 1457  BP: (!) 143/80  Pulse: 74  Resp: 20  SpO2: 98%   General: No acute distress Head:  Normocephalic/atraumatic Skin/Extremities: No rash, no edema Neurological Exam: alert and oriented to person, place. No aphasia or dysarthria. Fund of knowledge is appropriate.  Recent and remote memory are impaired, 0/3 delayed recall.  Attention and concentration are reduced,  1/5 serial 7s. States he cannot read/write.  Cranial nerves: Pupils equal, round. Extraocular movements intact with no nystagmus. Visual fields full.  No facial asymmetry.  Motor: Bulk and tone normal, muscle strength 5/5 throughout with no pronator drift.   Finger to nose testing intact.  Gait narrow-based and steady, no ataxia   IMPRESSION: This is an 86 yo RH man with vascular risk factors including hypertension, hyperlipidemia, and moderate dementia, likely vascular. MRI brain showed moderate chronic microvascular disease. SLUMS score 6/30 in May 2021. He is on Donepezil 5mg  daily, they report he is not taking the Memantine but will check medication bottles at home. No behavioral changes. We again discussed driving, his wife rides with him at all times, continue to closely monitor. Follow-up in 6-8 months, they know to call for any changes.   Thank you for allowing me to participate in his care.  Please do not hesitate to call for any questions or concerns.  , M.D.   CC: Dr. Patrcia Dolly

## 2020-03-17 NOTE — Patient Instructions (Signed)
Good to see you!  1. Please call our office on Monday and confirm all the medications you are currently taking  2. Continue to have wife in car at all times when driving  3. Follow-up in 6-8 months,call for any changes   FALL PRECAUTIONS: Be cautious when walking. Scan the area for obstacles that may increase the risk of trips and falls. When getting up in the mornings, sit up at the edge of the bed for a few minutes before getting out of bed. Consider elevating the bed at the head end to avoid drop of blood pressure when getting up. Walk always in a well-lit room (use night lights in the walls). Avoid area rugs or power cords from appliances in the middle of the walkways. Use a walker or a cane if necessary and consider physical therapy for balance exercise. Get your eyesight checked regularly.  FINANCIAL OVERSIGHT: Supervision, especially oversight when making financial decisions or transactions is also recommended.  HOME SAFETY: Consider the safety of the kitchen when operating appliances like stoves, microwave oven, and blender. Consider having supervision and share cooking responsibilities until no longer able to participate in those. Accidents with firearms and other hazards in the house should be identified and addressed as well.  DRIVING: Regarding driving, in patients with progressive memory problems, driving will be impaired. We advise to have someone else do the driving if trouble finding directions or if minor accidents are reported. Independent driving assessment is available to determine safety of driving.  ABILITY TO BE LEFT ALONE: If patient is unable to contact 911 operator, consider using LifeLine, or when the need is there, arrange for someone to stay with patients. Smoking is a fire hazard, consider supervision or cessation. Risk of wandering should be assessed by caregiver and if detected at any point, supervision and safe proof recommendations should be  instituted.  MEDICATION SUPERVISION: Inability to self-administer medication needs to be constantly addressed. Implement a mechanism to ensure safe administration of the medications.  RECOMMENDATIONS FOR ALL PATIENTS WITH MEMORY PROBLEMS: 1. Continue to exercise (Recommend 30 minutes of walking everyday, or 3 hours every week) 2. Increase social interactions - continue going to [place] and enjoy social gatherings with friends and family 3. Eat healthy, avoid fried foods and eat more fruits and vegetables 4. Maintain adequate blood pressure, blood sugar, and blood cholesterol level. Reducing the risk of stroke and cardiovascular disease also helps promoting better memory. 5. Avoid stressful situations. Live a simple life and avoid aggravations. Organize your time and prepare for the next day in anticipation. 6. Sleep well, avoid any interruptions of sleep and avoid any distractions in the bedroom that may interfere with adequate sleep quality 7. Avoid sugar, avoid sweets as there is a strong link between excessive sugar intake, diabetes, and cognitive impairment The Mediterranean diet has been shown to help patients reduce the risk of progressive memory disorders and reduces cardiovascular risk. This includes eating fish, eat fruits and green leafy vegetables, nuts like almonds and hazelnuts, walnuts, and also use olive oil. Avoid fast foods and fried foods as much as possible. Avoid sweets and sugar as sugar use has been linked to worsening of memory function.  There is always a concern of gradual progression of memory problems. If this is the case, then we may need to adjust level of care according to patient needs. Support, both to the patient and caregiver, should then be put into place.

## 2020-03-30 ENCOUNTER — Encounter: Payer: Self-pay | Admitting: Internal Medicine

## 2020-03-30 ENCOUNTER — Other Ambulatory Visit: Payer: Self-pay

## 2020-03-30 ENCOUNTER — Ambulatory Visit: Payer: PPO | Admitting: Internal Medicine

## 2020-03-30 DIAGNOSIS — J449 Chronic obstructive pulmonary disease, unspecified: Secondary | ICD-10-CM | POA: Diagnosis not present

## 2020-03-30 DIAGNOSIS — R058 Other specified cough: Secondary | ICD-10-CM

## 2020-03-30 MED ORDER — PREDNISONE 10 MG PO TABS: ORAL_TABLET | ORAL | 0 refills | Status: DC

## 2020-03-30 NOTE — Assessment & Plan Note (Signed)
Allergy profile 05/21/2017 >  Eos 0.2 /  IgE  15 RAST neg -  Sinus CT 05/29/2017 1. Bilateral maxillary sinusitis with fluid levels. 2. Chronic left sphenoid sinusitis with mild mucosal thickening > f/u [facility]: ov 07/09/17 c/w persistent sinusitis > rec FESS> pt canceled  - repeat Sinus CT 02/14/2018 > Bilateral maxillary sinus disease seen on the most recent CT scan has markedly improved. There is mild residual mucosal thickening in the inferior maxillary sinuses bilaterally. Mild mucosal thickening in the sphenoid sinuses is greater on the left and has increased since the prior study. - 03/30/2020 c/o persistent rhinitis > referred back to ent Annalee Genta)  at Dr Silvano Rusk discretion           Each maintenance medication was reviewed in detail including emphasizing most importantly the difference between maintenance and prns and under what circumstances the prns are to be triggered using an action plan format where appropriate.    directly observing portions of ambulatory 02 saturation study/  and generating customized AVS unique to this office visit / charting = 37 min

## 2020-03-30 NOTE — Assessment & Plan Note (Addendum)
Quit smoking 2002  Spirometry 09/27/12  FEV1  1.06 (44%) ratio 47  - Spirometry 05/04/2017  FEV1 0.32 (16%)  Ratio 81 but f/v not physiologic   - 05/04/2017    > try symb 80 2bid as cough is the primary concern   - PFT's  07/17/2017  FEV1 1.17 (64 % ) ratio 64  p 20 % improvement from saba p nothing prior to study with DLCO  54 % corrects to 72  % for alv volume   - 09/12/2018 added pred as plan D - 05/26/2019  After extensive coaching inhaler device,  effectiveness =  75% > try breztri 2bid and start back on gerd rx  - 03/30/2020 back on symbicort 80 2bid with good control  -  03/30/2020   Walked RA  3 laps @ approx 281ft each @ moderately fast pace  stopped due to end of study, no sob, sats at end 97%    Adequate control on present rx, reviewed in detail with pt > no change in rx needed    The goal with a chronic steroid dependent illness is always arriving at the lowest effective dose that controls the disease/symptoms and not accepting a set "formula" which is based on statistics or guidelines that don't always take into account patient  variability or the natural hx of the dz in every individual patient, which may well vary over time.  For now therefore I recommend the patient maintain  Off prednisone but keep supply handy as "plan D" = Prednisone 10 mg take  4 each am x 2 days,   2 each am x 2 days,  1 each am x 2 days and stop    F/u  q 3 m - call sooner if needed - with all meds in hand using a trust but verify approach to confirm accurate Medication  Reconciliation The principal here is that until we are certain that the  patients are doing what we've asked, it makes no sense to ask them to do more.

## 2020-03-30 NOTE — Patient Instructions (Addendum)
Plan A = Automatic = Always=    Symbicort 80 Take 2 puffs first thing in am and then another 2 puffs about 12 hours later.   Work on inhaler technique:  relax and gently blow all the way out then take a nice smooth deep breath back in, triggering the inhaler at same time you start breathing in.  Hold for up to 5 seconds if you can. Blow out thru nose. Rinse and gargle with water when done  Plan B = Backup (to supplement plan A, not to replace it) Only use your albuterol inhaler as a rescue medication to be used if you can't catch your breath by resting or doing a relaxed purse lip breathing pattern.  - The less you use it, the better it will work when you need it. - Ok to use the inhaler up to 2 puffs  every 4 hours if you must but call for appointment if use goes up over your usual need - Don't leave home without it !!  (think of it like the spare tire for your car)   Plan C = Crisis (instead of Plan B but only if Plan B stops working) - only use your albuterol nebulizer if you first try Plan B and it fails to help > ok to use the nebulizer up to every 4 hours but if start needing it regularly call for immediate appointment   Plan D = Deltasone  - if ABC not working to your satisfaction then Prednisone 10 mg take  4 each am x 2 days,   2 each am x 2 days,  1 each am x 2 days and stop   ENT evaluation would be the next stop but but I'll defer this to Dr Eloise Harman    Please schedule a follow up visit in 3 months but call sooner if needed  with all medications /inhalers/ solutions in hand so we can verify exactly what you are taking. This includes all medications from all doctors and over the counters

## 2020-03-30 NOTE — Progress Notes (Signed)
Subjective:   Patient ID: Alexander [name], male    DOB: 1932/05/01    MRN: [id number]     Brief patient profile:  87  yobm quit smoking 04/2000    gold III COPD     01/09/13  Cardiac evaluation - echo nml  Stress test - peak VO2 68%, limited by ventilation  c. cath [name]) -11/10 - nml LV fn, nml coronaries  Spirometry >>FEV1 1.14 - 45% -moderate airway obstruction       05/04/2017 acute extended ov/Shannyn Jankowiak re:  COPD III  Really not clear what meds he's been taking nor is his wife sure Chief Complaint  Patient presents with  . Acute Visit    productive cough with clear,gray sputum,SOB w/ activity,feels it has improved with the inhaler he uses  daily cough x 3 months, worse in am but all day long and doe = MMRC3 = can't walk 100 yards even at a slow pace at a flat grade s stopping due to sob  - still shops at [employer] but can't get around without resting  Assoc rhinitis/ nasal congestion  Very confused again with meds  rec Symbicort 80  Take 2 puffs first thing in am and then another 2 puffs about 12 hours later.  Pantoprazole (protonix) 40 mg   Take  30-60 min before first meal of the day and Pepcid (famotidine)  20 mg one @  bedtime until return to office Prednisone 10 mg take  4 each am x 2 days,   2 each am x 2 days,  1 each am x 2 days and stop   Augmentin 875 mg take one pill twice daily  X 10 days - take at breakfast and supper with large glass of water.  GERD diet  Work on inhaler technique   Please schedule a follow up office visit in 2 weeks, sooner if needed with all medications / inhalers / solutions    05/21/2017  f/u ov/Alexander [name] re:  COPD GOLD III / main problem is coughing > sob  Chief Complaint  Patient presents with  . Follow-up    Cough is about the same. He has not been looking at what color sputum he is producing. He still has abx left over, he has only been taking 1 tablet daily.   Cough was gone to his satisfaction until 3 days prior to OV  Then acute worse/ min  productive/ still on augmentin  Wife has same "cold"   pt brought symbicort 80 sample well past the red line/ again confused with meds Dspnea:  No change = MMRC3 = can't walk 100 yards even at a slow pace at a flat grade s stopping due to sob  / not using neb saba at all or following action plan  Sleep: disrupted by coughing fits now and has to sleep propped up  rec Plan A = Automatic = symbicort 80 Take 2 puffs first thing in am and then another 2 puffs about 12 hours later.  Work on inhaler technique:   Plan B = Backup Only use your albuterol as a rescue medicatio Best cough medicine > delsym 2 tsp every 12 hours and if still coughing supplement with Tylenol #3 Finish your antibiotics Please see patient coordinator before you leave today  to schedule sinus CT  Please remember to go to the lab department downstairs in the basement  for your tests - we will call you with the results when they are available.    Please  schedule a follow up office visit in 2 weeks,     06/04/2017  f/u ov/Alexander [name] re:  Ab/ sinusitis  Chief Complaint  Patient presents with  . Follow-up    Increased cough with grey sputum since the last visit.   using saba neb several times a day  Dyspnea:  MMRC2 = can't walk a nl pace on a flat grade s sob but does fine slow and flat  Cough: esp after supper variably productive min slt dark mucus Sleep: ok flat   Main complaints are related to nasal congestion   rec No change in medications for now Keep appt to see Dr Annalee Genta > rec FESS> did not do      03/11/2019  f/u ov/Alexander [name] re:  Copd III / ab did not bring meds, has not needed Plan D  Chief Complaint  Patient presents with  . Follow-up    Breathing is doing well. He states he is using his albuterol inhaler daily.   Dyspnea:  Still doing yard work Cough: none  Sleeping: ok flat, two pillows  SABA use: confused with how/ when to use "albuterol inhaler" and actually doesn't have one now per wife as was not refilled    02: none  rec Plan A = Automatic = Symbicort 80 Take 2 puffs first thing in am and then another 2 puffs about 12 hours later.  Work on Archivist:   Plan B = Backup Only use your albuterol inhaler as a rescue medication  Plan C = Crisis - only use your albuterol nebulizer if you first try Plan B and it fails to help > ok to use the nebulizer up to every 4 hours but if start needing it regularly call for immediate appointment Plan D = Deltasone (Prednisone)  - take this if need nebulizer more than rarely  Prednisone 10 mg take  4 each am x 2 days,   2 each am x 2 days,  1 each am x 2 days and stop    05/26/2019  f/u ov/Alexander [name] re: aecopd  Chief Complaint  Patient presents with  . Acute Visit    Pt c/o increased DOE x 2 wks. He also c/o cough with gray sputum.  He is using his proair 1-2 x per day.   Dyspnea:  Around the house / worse off gerd rx "it ran out and no one would refill it"  Cough: minimal am gray mucus  Sleeping: two pillows  SABA use: only 2 x daily hfa even with flare, neb tubing old/ not following action plans x for pred which seems to help some Rec Plan A = Automatic = Always=    symbicort or Breztri Take 2 puffs first thing in am and then another 2 puffs about 12 hours later.  Pantoprazole (protonix) 40 mg   Take  30-60 min before first meal of the day and Pepcid (famotidine)  40 mg one after supper until return to office - this is the best way to tell whether stomach acid is contributing to your problem.   Plan B = Backup (to supplement plan A, not to replace it) Only use your albuterol inhaler as a rescue medication Plan C = Crisis (instead of Plan B but only if Plan B stops working) - only use your albuterol nebulizer if you first try Plan B and it fails to help > ok to use the nebulizer up to every 4 hours but if start needing it regularly call for immediate appointment  Plan D = Deltasone = Prednisone  - Prednisone 10 mg take  4 each am x 2 days,   2  each am x 2 days,  1 each am x 2 days and stop  Please schedule a follow up office visit in 4 weeks, sooner if needed  with all medications /inhalers/ solutions in hand so we can verify exactly what you are taking. This includes all medications from all doctors and over the counters   07/08/2019  Acute ov/Alexander [name] re:all better until  worse sob/ cough/ wheeze / second shot  06/06/19  Chief Complaint  Patient presents with  . Acute Visit    Breathing worse x 4 days, wheezing, cough with green/gray sputum.    Dyspnea: increase since 07/05/19 with cough/ wheeze minimally discolored sputum   Sleeping: worse since onset of sob/ bed flat on 2 pillows  SABA use: hfa and neb but not following action plan written / reviewed t last ov  02: none  rec Plan A = Automatic = Always=    symbicort or Breztri Take 2 puffs first thing in am and then another 2 puffs about 12 hours later.  Continue pantoprazole (protonix) 40 mg   Take  30-60 min before first meal of the day and Pepcid (famotidine)  40 mg one after supper until return to office - this is the best way to tell whether stomach acid is contributing to your problem.   Plan B = Backup (to supplement plan A, not to replace it) Only use your albuterol inhaler as a rescue medication  Plan C = Crisis (instead of Plan B but only if Plan B stops working) - only use your albuterol nebulizer if you first try Plan B and it fails to help  Plan D = Deltasone = Prednisone  - Prednisone 10 mg take  4 each am x 2 days,   2 each am x 2 days,  1 each am x 2 days and stop  zpak     03/30/2020  f/u ov/Alexander [name] re: GOLD III copd  Chief Complaint  Patient presents with  . Follow-up    shortness of breath with activity  Dyspnea: mb and back is fine / yard work is fine / can't really identify a specific activity that makes him sob  Cough: daytime tickle ? pnds "I feel like I have a hole in my nose"  Sleeping: fine flat  SABA use:  A couple times a day then neb couple times a  week  02: none    No obvious day to day or daytime variability or assoc excess/ purulent sputum or mucus plugs or hemoptysis or cp or chest tightness, subjective wheeze or overt  hb symptoms.   Sleeping  without nocturnal  or early am exacerbation  of respiratory  c/o's or need for noct saba. Also denies any obvious fluctuation of symptoms with weather or environmental changes or other aggravating or alleviating factors except as outlined above   No unusual exposure hx or h/o childhood pna/ asthma or knowledge of premature birth.  Current Allergies, Complete Past Medical History, Past Surgical History, Family History, and Social History were reviewed in [employer] record.  ROS  The following are not active complaints unless bolded Hoarseness, sore throat, dysphagia, dental problems, itching, sneezing,  nasal congestion or discharge of excess mucus or purulent secretions, ear ache,   fever, chills, sweats, unintended wt loss or wt gain, classically pleuritic or exertional cp,  orthopnea pnd or arm/hand  swelling  or leg swelling, presyncope, palpitations, abdominal pain, anorexia, nausea, vomiting, diarrhea  or change in bowel habits or change in bladder habits, change in stools or change in urine, dysuria, hematuria,  rash, arthralgias, visual complaints, headache, numbness, weakness or ataxia or problems with walking or coordination,  change in mood or  memory.        Current Meds  Medication Sig  . albuterol (PROAIR HFA) 108 (90 Base) MCG/ACT inhaler 2 puffs every 4 hours as needed only  if your can't catch your breath (Patient taking differently: Inhale 2 puffs into the lungs every 4 (four) hours as needed for wheezing or shortness of breath. 2 puffs every 4 hours as needed only  if your can't catch your breath)  . albuterol (PROVENTIL) (2.5 MG/3ML) 0.083% nebulizer solution TAKE 3 MLS BY NEBULIZATION EVERY 6 HOURS AS NEEDED FOR WHEEZING OR SHORTNESS OF BREATH.  .  budesonide-formoterol (SYMBICORT) 80-4.5 MCG/ACT inhaler Inhale 2 puffs into the lungs 2 (two) times daily.  Marland Kitchen. lactulose (CONSTULOSE) 10 GM/15ML solution Take 15 mLs (10 g total) by mouth daily as needed for mild constipation.  . memantine (NAMENDA) 10 MG tablet Take 1/2 tablet twice a day  . triamcinolone cream (KENALOG) 0.1 % Apply 1 application topically 2 (two) times daily as needed (itching).                                 Objective:   Physical Exam   03/30/2020      141 07/08/2019      155  05/26/2019        159  03/11/2019    152 12/09/2018      154   09/12/2018     158 03/15/2018    165  02/01/2018    161  01/04/2018      166  10/18/2017      166 07/17/2017       173   06/04/2017      176  05/21/2017       175  05/04/2017       173   03/17/2016    182   02/03/14 177 lb (80.287 kg)  01/27/14 177 lb 12 oz (80.627 kg)  01/06/14 173 lb 8 oz (78.699 kg)     Vital signs reviewed  03/30/2020  - Note at rest 02 sats  97% on RA  amb bm nad / easily confused with details of care       Report :Full dentures    HEENT : pt wearing mask not removed for exam due to covid - 19 concerns.    NECK :  without JVD/Nodes/TM/ nl carotid upstrokes bilaterally   LUNGS: no acc muscle use,  Mild barrel  contour chest wall with bilateral  Distant bs s audible wheeze and  without cough on insp or exp maneuvers  and mild  Hyperresonant  to  percussion bilaterally     CV:  RRR  no s3 or murmur or increase in P2, and no edema   ABD:  soft and nontender with pos end  insp Hoover's  in the supine position. No bruits or organomegaly appreciated, bowel sounds nl  MS:   Nl gait/  ext warm without deformities, calf tenderness, cyanosis or clubbing No obvious joint restrictions   SKIN: warm and dry without lesions    NEURO:  alert, approp, nl sensorium with  no motor or cerebellar deficits  apparent.         Assessment & Plan:

## 2020-03-31 ENCOUNTER — Other Ambulatory Visit: Payer: Self-pay | Admitting: Gastroenterology

## 2020-04-01 ENCOUNTER — Other Ambulatory Visit: Payer: Self-pay | Admitting: Gastroenterology

## 2020-04-01 ENCOUNTER — Telehealth: Payer: Self-pay | Admitting: Gastroenterology

## 2020-04-02 ENCOUNTER — Other Ambulatory Visit: Payer: Self-pay

## 2020-04-02 ENCOUNTER — Telehealth: Payer: Self-pay | Admitting: Gastroenterology

## 2020-04-02 MED ORDER — LINACLOTIDE 72 MCG PO CAPS
72.0000 ug | ORAL_CAPSULE | Freq: Every day | ORAL | 6 refills | Status: DC
Start: 2020-04-02 — End: 2021-05-05

## 2020-04-02 NOTE — Telephone Encounter (Signed)
Refill submitted. Patient contacted and advised of this. [facility] [address].

## 2020-04-11 ENCOUNTER — Observation Stay (HOSPITAL_COMMUNITY): Payer: PPO

## 2020-04-11 ENCOUNTER — Emergency Department (HOSPITAL_COMMUNITY): Payer: PPO

## 2020-04-11 ENCOUNTER — Other Ambulatory Visit: Payer: Self-pay

## 2020-04-11 ENCOUNTER — Inpatient Hospital Stay (HOSPITAL_COMMUNITY)
Admission: EM | Admit: 2020-04-11 | Discharge: 2020-04-13 | DRG: 178 | Disposition: A | Discharge status: Home / Self Care | Payer: PPO | Attending: Internal Medicine | Admitting: Internal Medicine

## 2020-04-11 DIAGNOSIS — Z7952 Long term (current) use of systemic steroids: Secondary | ICD-10-CM

## 2020-04-11 DIAGNOSIS — K219 Gastro-esophageal reflux disease without esophagitis: Secondary | ICD-10-CM | POA: Diagnosis present

## 2020-04-11 DIAGNOSIS — I2510 Atherosclerotic heart disease of native coronary artery without angina pectoris: Secondary | ICD-10-CM | POA: Diagnosis present

## 2020-04-11 DIAGNOSIS — R0902 Hypoxemia: Secondary | ICD-10-CM | POA: Diagnosis present

## 2020-04-11 DIAGNOSIS — J9811 Atelectasis: Secondary | ICD-10-CM | POA: Diagnosis not present

## 2020-04-11 DIAGNOSIS — R509 Fever, unspecified: Secondary | ICD-10-CM | POA: Diagnosis not present

## 2020-04-11 DIAGNOSIS — J324 Chronic pansinusitis: Secondary | ICD-10-CM | POA: Diagnosis present

## 2020-04-11 DIAGNOSIS — N401 Enlarged prostate with lower urinary tract symptoms: Secondary | ICD-10-CM | POA: Diagnosis present

## 2020-04-11 DIAGNOSIS — R55 Syncope and collapse: Secondary | ICD-10-CM

## 2020-04-11 DIAGNOSIS — G301 Alzheimer's disease with late onset: Secondary | ICD-10-CM | POA: Diagnosis not present

## 2020-04-11 DIAGNOSIS — Z83438 Family history of other disorder of lipoprotein metabolism and other lipidemia: Secondary | ICD-10-CM

## 2020-04-11 DIAGNOSIS — Z5321 Procedure and treatment not carried out due to patient leaving prior to being seen by health care provider: Secondary | ICD-10-CM | POA: Diagnosis present

## 2020-04-11 DIAGNOSIS — I119 Hypertensive heart disease without heart failure: Secondary | ICD-10-CM | POA: Diagnosis present

## 2020-04-11 DIAGNOSIS — R0602 Shortness of breath: Secondary | ICD-10-CM

## 2020-04-11 DIAGNOSIS — I10 Essential (primary) hypertension: Secondary | ICD-10-CM | POA: Diagnosis not present

## 2020-04-11 DIAGNOSIS — N138 Other obstructive and reflux uropathy: Secondary | ICD-10-CM | POA: Diagnosis present

## 2020-04-11 DIAGNOSIS — E785 Hyperlipidemia, unspecified: Secondary | ICD-10-CM | POA: Diagnosis present

## 2020-04-11 DIAGNOSIS — J449 Chronic obstructive pulmonary disease, unspecified: Secondary | ICD-10-CM | POA: Diagnosis present

## 2020-04-11 DIAGNOSIS — F32A Depression, unspecified: Secondary | ICD-10-CM | POA: Diagnosis present

## 2020-04-11 DIAGNOSIS — U071 COVID-19: Principal | ICD-10-CM

## 2020-04-11 DIAGNOSIS — Z8249 Family history of ischemic heart disease and other diseases of the circulatory system: Secondary | ICD-10-CM | POA: Diagnosis not present

## 2020-04-11 DIAGNOSIS — Z823 Family history of stroke: Secondary | ICD-10-CM | POA: Diagnosis not present

## 2020-04-11 DIAGNOSIS — J019 Acute sinusitis, unspecified: Secondary | ICD-10-CM | POA: Diagnosis not present

## 2020-04-11 DIAGNOSIS — R404 Transient alteration of awareness: Secondary | ICD-10-CM | POA: Diagnosis not present

## 2020-04-11 DIAGNOSIS — I491 Atrial premature depolarization: Secondary | ICD-10-CM | POA: Diagnosis not present

## 2020-04-11 DIAGNOSIS — Z87891 Personal history of nicotine dependence: Secondary | ICD-10-CM | POA: Diagnosis not present

## 2020-04-11 DIAGNOSIS — I2699 Other pulmonary embolism without acute cor pulmonale: Secondary | ICD-10-CM | POA: Diagnosis not present

## 2020-04-11 DIAGNOSIS — G309 Alzheimer's disease, unspecified: Secondary | ICD-10-CM | POA: Diagnosis present

## 2020-04-11 DIAGNOSIS — F0280 Dementia in other diseases classified elsewhere without behavioral disturbance: Secondary | ICD-10-CM

## 2020-04-11 DIAGNOSIS — F0390 Unspecified dementia without behavioral disturbance: Secondary | ICD-10-CM | POA: Diagnosis present

## 2020-04-11 DIAGNOSIS — R059 Cough, unspecified: Secondary | ICD-10-CM | POA: Diagnosis not present

## 2020-04-11 DIAGNOSIS — Z7951 Long term (current) use of inhaled steroids: Secondary | ICD-10-CM | POA: Diagnosis not present

## 2020-04-11 DIAGNOSIS — R9431 Abnormal electrocardiogram [ECG] [EKG]: Secondary | ICD-10-CM | POA: Diagnosis not present

## 2020-04-11 DIAGNOSIS — R0689 Other abnormalities of breathing: Secondary | ICD-10-CM | POA: Diagnosis not present

## 2020-04-11 DIAGNOSIS — F419 Anxiety disorder, unspecified: Secondary | ICD-10-CM | POA: Diagnosis present

## 2020-04-11 DIAGNOSIS — Z79899 Other long term (current) drug therapy: Secondary | ICD-10-CM | POA: Diagnosis not present

## 2020-04-11 DIAGNOSIS — J90 Pleural effusion, not elsewhere classified: Secondary | ICD-10-CM | POA: Diagnosis not present

## 2020-04-11 DIAGNOSIS — I6782 Cerebral ischemia: Secondary | ICD-10-CM | POA: Diagnosis not present

## 2020-04-11 LAB — CBC
HCT: 36.7 % — ABNORMAL LOW (ref 39.0–52.0)
Hemoglobin: 11.5 g/dL — ABNORMAL LOW (ref 13.0–17.0)
MCH: 30.3 pg (ref 26.0–34.0)
MCHC: 31.3 g/dL (ref 30.0–36.0)
MCV: 96.8 fL (ref 80.0–100.0)
Platelets: 231 10*3/uL (ref 150–400)
RBC: 3.79 MIL/uL — ABNORMAL LOW (ref 4.22–5.81)
RDW: 12.5 % (ref 11.5–15.5)
WBC: 6.7 10*3/uL (ref 4.0–10.5)
nRBC: 0 % (ref 0.0–0.2)

## 2020-04-11 LAB — COMPREHENSIVE METABOLIC PANEL
ALT: 14 U/L (ref 0–44)
AST: 19 U/L (ref 15–41)
Albumin: 3.3 g/dL — ABNORMAL LOW (ref 3.5–5.0)
Alkaline Phosphatase: 50 U/L (ref 38–126)
Anion gap: 12 (ref 5–15)
BUN: 13 mg/dL (ref 8–23)
CO2: 24 mmol/L (ref 22–32)
Calcium: 8.3 mg/dL — ABNORMAL LOW (ref 8.9–10.3)
Chloride: 101 mmol/L (ref 98–111)
Creatinine, Ser: 1.09 mg/dL (ref 0.61–1.24)
GFR, Estimated: >60 mL/min (ref >60)
Glucose, Bld: 95 mg/dL (ref 70–99)
Potassium: 3.9 mmol/L (ref 3.5–5.1)
Sodium: 137 mmol/L (ref 135–145)
Total Bilirubin: 0.6 mg/dL (ref 0.3–1.2)
Total Protein: 5.5 g/dL — ABNORMAL LOW (ref 6.5–8.1)

## 2020-04-11 LAB — URINALYSIS, ROUTINE W REFLEX MICROSCOPIC
Bacteria, UA: NONE SEEN
Bilirubin Urine: NEGATIVE
Glucose, UA: NEGATIVE mg/dL
Ketones, ur: NEGATIVE mg/dL
Leukocytes,Ua: NEGATIVE
Nitrite: NEGATIVE
Protein, ur: NEGATIVE mg/dL
Specific Gravity, Urine: 1.005 (ref 1.005–1.030)
pH: 6 (ref 5.0–8.0)

## 2020-04-11 LAB — CBC WITH DIFFERENTIAL/PLATELET
Abs Immature Granulocytes: 0.02 10*3/uL (ref 0.00–0.07)
Basophils Absolute: 0 10*3/uL (ref 0.0–0.1)
Basophils Relative: 0 %
Eosinophils Absolute: 0 10*3/uL (ref 0.0–0.5)
Eosinophils Relative: 1 %
HCT: 37.1 % — ABNORMAL LOW (ref 39.0–52.0)
Hemoglobin: 11.7 g/dL — ABNORMAL LOW (ref 13.0–17.0)
Immature Granulocytes: 0 %
Lymphocytes Relative: 14 %
Lymphs Abs: 1 10*3/uL (ref 0.7–4.0)
MCH: 30.3 pg (ref 26.0–34.0)
MCHC: 31.5 g/dL (ref 30.0–36.0)
MCV: 96.1 fL (ref 80.0–100.0)
Monocytes Absolute: 1.3 10*3/uL — ABNORMAL HIGH (ref 0.1–1.0)
Monocytes Relative: 18 %
Neutro Abs: 4.7 10*3/uL (ref 1.7–7.7)
Neutrophils Relative %: 67 %
Platelets: 217 10*3/uL (ref 150–400)
RBC: 3.86 MIL/uL — ABNORMAL LOW (ref 4.22–5.81)
RDW: 12.5 % (ref 11.5–15.5)
WBC: 7 10*3/uL (ref 4.0–10.5)
nRBC: 0 % (ref 0.0–0.2)

## 2020-04-11 LAB — TROPONIN I (HIGH SENSITIVITY)
Troponin I (High Sensitivity): 26 ng/L — ABNORMAL HIGH (ref <18)
Troponin I (High Sensitivity): 32 ng/L — ABNORMAL HIGH (ref <18)

## 2020-04-11 LAB — PROCALCITONIN
Procalcitonin: <0.1 ng/mL
Procalcitonin: <0.1 ng/mL

## 2020-04-11 LAB — MAGNESIUM: Magnesium: 1.9 mg/dL (ref 1.7–2.4)

## 2020-04-11 LAB — LACTIC ACID, PLASMA: Lactic Acid, Venous: 1.1 mmol/L (ref 0.5–1.9)

## 2020-04-11 LAB — CBG MONITORING, ED: Glucose-Capillary: 95 mg/dL (ref 70–99)

## 2020-04-11 LAB — LACTATE DEHYDROGENASE
LDH: 143 U/L (ref 98–192)
LDH: 158 U/L (ref 98–192)

## 2020-04-11 LAB — TRIGLYCERIDES: Triglycerides: 78 mg/dL (ref <150)

## 2020-04-11 LAB — FERRITIN
Ferritin: 117 ng/mL (ref 24–336)
Ferritin: 119 ng/mL (ref 24–336)

## 2020-04-11 LAB — D-DIMER, QUANTITATIVE
D-Dimer, Quant: 0.62 ug/mL-FEU — ABNORMAL HIGH (ref 0.00–0.50)
D-Dimer, Quant: 0.77 ug/mL-FEU — ABNORMAL HIGH (ref 0.00–0.50)

## 2020-04-11 LAB — RESP PANEL BY RT-PCR (FLU A&B, COVID) ARPGX2
Influenza A by PCR: NEGATIVE
Influenza B by PCR: NEGATIVE
SARS Coronavirus 2 by RT PCR: POSITIVE — AB

## 2020-04-11 LAB — C-REACTIVE PROTEIN: CRP: 3.3 mg/dL — ABNORMAL HIGH (ref <1.0)

## 2020-04-11 LAB — CREATININE, SERUM
Creatinine, Ser: 1.09 mg/dL (ref 0.61–1.24)
GFR, Estimated: >60 mL/min (ref >60)

## 2020-04-11 LAB — FIBRINOGEN
Fibrinogen: 323 mg/dL (ref 210–475)
Fibrinogen: 339 mg/dL (ref 210–475)

## 2020-04-11 MED ORDER — ONDANSETRON HCL 4 MG/2ML IJ SOLN: 4.0000 mg | Freq: Four times a day (QID) | INTRAMUSCULAR | Status: DC | PRN

## 2020-04-11 MED ORDER — SODIUM CHLORIDE 0.9 % IV SOLN
200.0000 mg | Freq: Once | INTRAVENOUS | Status: AC
  Administered 2020-04-11: 16:00:00 200 mg via INTRAVENOUS
  Filled 2020-04-11: qty 40

## 2020-04-11 MED ORDER — SODIUM CHLORIDE 0.9 % IV SOLN: INTRAVENOUS | Status: AC

## 2020-04-11 MED ORDER — SODIUM CHLORIDE 0.9 % IV BOLUS
500.0000 mL | Freq: Once | INTRAVENOUS | Status: AC
  Administered 2020-04-11: 10:00:00 500 mL via INTRAVENOUS

## 2020-04-11 MED ORDER — IOHEXOL 350 MG/ML SOLN
75.0000 mL | Freq: Once | INTRAVENOUS | Status: AC | PRN
  Administered 2020-04-11: 75 mL via INTRAVENOUS

## 2020-04-11 MED ORDER — ENOXAPARIN SODIUM 40 MG/0.4ML ~~LOC~~ SOLN
40.0000 mg | SUBCUTANEOUS | Status: DC
  Administered 2020-04-11 – 2020-04-12 (×2): 40 mg via SUBCUTANEOUS
  Filled 2020-04-11 (×2): qty 0.4

## 2020-04-11 MED ORDER — SODIUM CHLORIDE 0.9 % IV SOLN
100.0000 mg | Freq: Every day | INTRAVENOUS | Status: DC
  Administered 2020-04-12: 10:00:00 100 mg via INTRAVENOUS
  Filled 2020-04-11: qty 100
  Filled 2020-04-11: qty 20

## 2020-04-11 MED ORDER — ALBUTEROL SULFATE HFA 108 (90 BASE) MCG/ACT IN AERS: 2.0000 | INHALATION_SPRAY | RESPIRATORY_TRACT | Status: DC | PRN

## 2020-04-11 MED ORDER — LINACLOTIDE 72 MCG PO CAPS
72.0000 ug | ORAL_CAPSULE | Freq: Every day | ORAL | Status: DC
  Administered 2020-04-12: 10:00:00 72 ug via ORAL
  Filled 2020-04-11 (×2): qty 1

## 2020-04-11 MED ORDER — BISACODYL 5 MG PO TBEC: 5.0000 mg | DELAYED_RELEASE_TABLET | Freq: Every day | ORAL | Status: DC | PRN

## 2020-04-11 MED ORDER — HYDROCOD POLST-CPM POLST ER 10-8 MG/5ML PO SUER: 5.0000 mL | Freq: Two times a day (BID) | ORAL | Status: DC | PRN

## 2020-04-11 MED ORDER — MAGNESIUM SULFATE IN D5W 1-5 GM/100ML-% IV SOLN
1.0000 g | Freq: Once | INTRAVENOUS | Status: AC
  Administered 2020-04-11: 16:00:00 1 g via INTRAVENOUS
  Filled 2020-04-11: qty 100

## 2020-04-11 MED ORDER — DONEPEZIL HCL 5 MG PO TABS
5.0000 mg | ORAL_TABLET | Freq: Every day | ORAL | Status: DC
  Filled 2020-04-11: qty 1

## 2020-04-11 MED ORDER — LACTULOSE 10 GM/15ML PO SOLN: 10.0000 g | Freq: Every day | ORAL | Status: DC | PRN

## 2020-04-11 MED ORDER — ONDANSETRON HCL 4 MG PO TABS: 4.0000 mg | ORAL_TABLET | Freq: Four times a day (QID) | ORAL | Status: DC | PRN

## 2020-04-11 MED ORDER — MOMETASONE FURO-FORMOTEROL FUM 100-5 MCG/ACT IN AERO
2.0000 | INHALATION_SPRAY | Freq: Two times a day (BID) | RESPIRATORY_TRACT | Status: DC
  Administered 2020-04-11 – 2020-04-12 (×3): 2 via RESPIRATORY_TRACT
  Filled 2020-04-11: qty 8.8

## 2020-04-11 MED ORDER — ADULT MULTIVITAMIN W/MINERALS CH
1.0000 | ORAL_TABLET | Freq: Every day | ORAL | Status: DC
  Administered 2020-04-11 – 2020-04-12 (×2): 1 via ORAL
  Filled 2020-04-11 (×2): qty 1

## 2020-04-11 MED ORDER — GUAIFENESIN-DM 100-10 MG/5ML PO SYRP: 10.0000 mL | ORAL_SOLUTION | ORAL | Status: DC | PRN

## 2020-04-11 MED ORDER — MEMANTINE HCL 5 MG PO TABS
5.0000 mg | ORAL_TABLET | Freq: Two times a day (BID) | ORAL | Status: DC
  Administered 2020-04-11 – 2020-04-12 (×3): 5 mg via ORAL
  Filled 2020-04-11 (×5): qty 1
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
## 2020-04-11 NOTE — ED Notes (Signed)
Pt has been ambulatory in room maintaining  O2 sat above 94% on RA

## 2020-04-11 NOTE — ED Notes (Signed)
Daughter Rosanne Gutting, would like a callback for update [phone]

## 2020-04-11 NOTE — ED Triage Notes (Signed)
Pt BIBA from home after a witnessed syncopal episode by his wife. Per EMS patient has had a productive cough for a "few" days with a green color. Pt reports feeling feverish. Skin hot to the touch. Hx of dementia. Pt is vaccinated against COVID.   EMS vitals as followed: BP 109/61 SPO2 90% with rhonchi breath sounds HR 70BPM RR 34 18G L FA PIV established.  CBG 138
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
## 2020-04-12 ENCOUNTER — Observation Stay (HOSPITAL_COMMUNITY): Payer: PPO

## 2020-04-12 ENCOUNTER — Inpatient Hospital Stay (HOSPITAL_COMMUNITY): Payer: PPO

## 2020-04-12 DIAGNOSIS — F419 Anxiety disorder, unspecified: Secondary | ICD-10-CM | POA: Diagnosis present

## 2020-04-12 DIAGNOSIS — J324 Chronic pansinusitis: Secondary | ICD-10-CM | POA: Diagnosis present

## 2020-04-12 DIAGNOSIS — R55 Syncope and collapse: Secondary | ICD-10-CM

## 2020-04-12 DIAGNOSIS — Z7951 Long term (current) use of inhaled steroids: Secondary | ICD-10-CM | POA: Diagnosis not present

## 2020-04-12 DIAGNOSIS — Z8249 Family history of ischemic heart disease and other diseases of the circulatory system: Secondary | ICD-10-CM | POA: Diagnosis not present

## 2020-04-12 DIAGNOSIS — N401 Enlarged prostate with lower urinary tract symptoms: Secondary | ICD-10-CM | POA: Diagnosis present

## 2020-04-12 DIAGNOSIS — R509 Fever, unspecified: Secondary | ICD-10-CM | POA: Diagnosis not present

## 2020-04-12 DIAGNOSIS — I119 Hypertensive heart disease without heart failure: Secondary | ICD-10-CM | POA: Diagnosis present

## 2020-04-12 DIAGNOSIS — R0902 Hypoxemia: Secondary | ICD-10-CM | POA: Diagnosis present

## 2020-04-12 DIAGNOSIS — Z823 Family history of stroke: Secondary | ICD-10-CM | POA: Diagnosis not present

## 2020-04-12 DIAGNOSIS — F32A Depression, unspecified: Secondary | ICD-10-CM | POA: Diagnosis present

## 2020-04-12 DIAGNOSIS — Z5321 Procedure and treatment not carried out due to patient leaving prior to being seen by health care provider: Secondary | ICD-10-CM | POA: Diagnosis present

## 2020-04-12 DIAGNOSIS — K219 Gastro-esophageal reflux disease without esophagitis: Secondary | ICD-10-CM | POA: Diagnosis present

## 2020-04-12 DIAGNOSIS — J449 Chronic obstructive pulmonary disease, unspecified: Secondary | ICD-10-CM | POA: Diagnosis present

## 2020-04-12 DIAGNOSIS — Z83438 Family history of other disorder of lipoprotein metabolism and other lipidemia: Secondary | ICD-10-CM | POA: Diagnosis not present

## 2020-04-12 DIAGNOSIS — Z79899 Other long term (current) drug therapy: Secondary | ICD-10-CM | POA: Diagnosis not present

## 2020-04-12 DIAGNOSIS — N138 Other obstructive and reflux uropathy: Secondary | ICD-10-CM | POA: Diagnosis present

## 2020-04-12 DIAGNOSIS — Z87891 Personal history of nicotine dependence: Secondary | ICD-10-CM | POA: Diagnosis not present

## 2020-04-12 DIAGNOSIS — F0280 Dementia in other diseases classified elsewhere without behavioral disturbance: Secondary | ICD-10-CM | POA: Diagnosis present

## 2020-04-12 DIAGNOSIS — I2510 Atherosclerotic heart disease of native coronary artery without angina pectoris: Secondary | ICD-10-CM | POA: Diagnosis present

## 2020-04-12 DIAGNOSIS — G309 Alzheimer's disease, unspecified: Secondary | ICD-10-CM | POA: Diagnosis present

## 2020-04-12 DIAGNOSIS — Z7952 Long term (current) use of systemic steroids: Secondary | ICD-10-CM | POA: Diagnosis not present

## 2020-04-12 DIAGNOSIS — R059 Cough, unspecified: Secondary | ICD-10-CM | POA: Diagnosis not present

## 2020-04-12 DIAGNOSIS — E785 Hyperlipidemia, unspecified: Secondary | ICD-10-CM | POA: Diagnosis present

## 2020-04-12 DIAGNOSIS — U071 COVID-19: Secondary | ICD-10-CM | POA: Diagnosis present

## 2020-04-12 LAB — CBC WITH DIFFERENTIAL/PLATELET
Abs Immature Granulocytes: 0 10*3/uL (ref 0.00–0.07)
Basophils Absolute: 0 10*3/uL (ref 0.0–0.1)
Basophils Relative: 0 %
Eosinophils Absolute: 0 10*3/uL (ref 0.0–0.5)
Eosinophils Relative: 1 %
HCT: 37.9 % — ABNORMAL LOW (ref 39.0–52.0)
Hemoglobin: 12 g/dL — ABNORMAL LOW (ref 13.0–17.0)
Immature Granulocytes: 0 %
Lymphocytes Relative: 15 %
Lymphs Abs: 0.8 10*3/uL (ref 0.7–4.0)
MCH: 30.2 pg (ref 26.0–34.0)
MCHC: 31.7 g/dL (ref 30.0–36.0)
MCV: 95.5 fL (ref 80.0–100.0)
Monocytes Absolute: 0.9 10*3/uL (ref 0.1–1.0)
Monocytes Relative: 18 %
Neutro Abs: 3.5 10*3/uL (ref 1.7–7.7)
Neutrophils Relative %: 66 %
Platelets: 203 10*3/uL (ref 150–400)
RBC: 3.97 MIL/uL — ABNORMAL LOW (ref 4.22–5.81)
RDW: 12.3 % (ref 11.5–15.5)
WBC: 5.2 10*3/uL (ref 4.0–10.5)
nRBC: 0 % (ref 0.0–0.2)

## 2020-04-12 LAB — COMPREHENSIVE METABOLIC PANEL
ALT: 14 U/L (ref 0–44)
AST: 21 U/L (ref 15–41)
Albumin: 3 g/dL — ABNORMAL LOW (ref 3.5–5.0)
Alkaline Phosphatase: 49 U/L (ref 38–126)
Anion gap: 9 (ref 5–15)
BUN: 13 mg/dL (ref 8–23)
CO2: 27 mmol/L (ref 22–32)
Calcium: 8.6 mg/dL — ABNORMAL LOW (ref 8.9–10.3)
Chloride: 102 mmol/L (ref 98–111)
Creatinine, Ser: 1.03 mg/dL (ref 0.61–1.24)
GFR, Estimated: >60 mL/min (ref >60)
Glucose, Bld: 86 mg/dL (ref 70–99)
Potassium: 3.8 mmol/L (ref 3.5–5.1)
Sodium: 138 mmol/L (ref 135–145)
Total Bilirubin: 0.6 mg/dL (ref 0.3–1.2)
Total Protein: 5.4 g/dL — ABNORMAL LOW (ref 6.5–8.1)

## 2020-04-12 LAB — ECHOCARDIOGRAM COMPLETE
Area-P 1/2: 2.6 cm2
S' Lateral: 3.6 cm

## 2020-04-12 LAB — C-REACTIVE PROTEIN: CRP: 6.6 mg/dL — ABNORMAL HIGH (ref <1.0)

## 2020-04-12 LAB — BRAIN NATRIURETIC PEPTIDE: B Natriuretic Peptide: 106.8 pg/mL — ABNORMAL HIGH (ref 0.0–100.0)

## 2020-04-12 LAB — MAGNESIUM: Magnesium: 2.1 mg/dL (ref 1.7–2.4)

## 2020-04-12 LAB — TROPONIN I (HIGH SENSITIVITY): Troponin I (High Sensitivity): 21 ng/L — ABNORMAL HIGH (ref <18)

## 2020-04-12 LAB — FERRITIN: Ferritin: 129 ng/mL (ref 24–336)

## 2020-04-12 LAB — PHOSPHORUS: Phosphorus: 3.2 mg/dL (ref 2.5–4.6)

## 2020-04-12 MED ORDER — ASPIRIN 81 MG PO CHEW
81.0000 mg | CHEWABLE_TABLET | Freq: Every day | ORAL | Status: DC
  Administered 2020-04-12: 10:00:00 81 mg via ORAL
  Filled 2020-04-12: qty 1

## 2020-04-12 MED ORDER — DEXAMETHASONE SODIUM PHOSPHATE 10 MG/ML IJ SOLN
6.0000 mg | INTRAMUSCULAR | Status: DC
  Administered 2020-04-12: 10:00:00 6 mg via INTRAVENOUS
  Filled 2020-04-12: qty 1

## 2020-04-12 NOTE — Progress Notes (Signed)
PROGRESS NOTE                                                                                                                                                                                                             Patient Demographics:    Alexander [name], is a 86 y.o. male, DOB - 1933-03-23, ZOX:[id number]  Outpatient Primary MD for the patient is Jarome Matin, MD    LOS - 0  Admit date - 04/11/2020    No chief complaint on file.      Brief Narrative (HPI from H&P)   Alexander [name] is a 86 y.o. male with history h/o COPD- Gold III, dementia, hypertension, CAD, BPH, s/p COVID vaccination x 2 early this year (Jan/Feb), was due for booster around this time but didn't schedule yet --now presents after a syncopal event witnessed by wife.  In the ER he was diagnosed with fever, COVID-19 infection and admitted to the hospital.   Subjective:    Alexander [name] today has, No headache, No chest pain, No abdominal pain - No Nausea, No new weakness tingling or numbness, mild cough but no SOB.   Assessment  & Plan :     1. Acute Covid 19 Viral Infection  - he has had 2 vaccine shots but has not received his third booster yet, so far does not appear to have any pulmonary involvement, has been started on remdesivir IV upon admission which is being continued.  Inflammatory markers are rising and I will closely monitor, he was febrile in the ER and CRP is rising, will start low-dose steroids and monitor closely.  Encouraged the patient to sit up in chair in the daytime use I-S and flutter valve for pulmonary toiletry and then prone in bed when at night.  Will advance activity and titrate down oxygen as possible.  SpO2: 94 %  Recent Labs  Lab 04/11/20 0910 04/11/20 0917 04/11/20 1515 04/12/20 0500  WBC 7.0  --  6.7 5.2  HGB 11.7*  --  11.5* 12.0*  HCT 37.1*  --  36.7* 37.9*  PLT 217  --  231 203  CRP 3.3*  --   --  6.6*   DDIMER 0.62*  --  0.77*  --   PROCALCITON <0.10  --  <0.10  --   AST 19  --   --  21  ALT 14  --   --  14  ALKPHOS 50  --   --  49  BILITOT 0.6  --   --  0.6  ALBUMIN 3.3*  --   --  3.0*  LATICACIDVEN 1.1  --   --   --   SARSCOV2NAA  --  POSITIVE*  --   --      2. Syncope with loss of consciousness at home.  While he was standing, does not appear to be consistent with seizure activity, no focal deficits or headache, CT head nonacute.  Could have been orthostatic, monitor orthostatics, hydrate IV fluids, check echocardiogram, PT OT evaluation.  Lives with wife at home and currently till fully evaluated and COVID deemed to be stable cannot be safely discharged.  3.  Mild early dementia.  At risk for delirium, minimize narcotics and benzodiazepines.  Supportive care.  4.  COPD.  Supportive care follows with Dr. Sherene Sires.  5.  Essential hypertension.  Blood pressure borderline.  No medications for now.  6.  History of CAD and LVH.  Repeat echo pending, no acute issues, no chest pain, mild non-ACS pattern elevation in troponin.  Trend is stable.  No chest pain, nonspecific EKG changes.  Placed on aspirin.  Will check echocardiogram for any wall motion abnormalities and to evaluate EF.  Results for MAXAMILLIAN, TIENDA (MRN [id number]) as of 04/12/2020 09:15  Ref. Range 07/09/2019 11:57 04/11/2020 09:10 04/11/2020 15:15 04/11/2020 19:09  Troponin I (High Sensitivity) Latest Ref Range: <18 ng/L 11  26 (H) 32 (H)      Condition - Extremely Guarded  Family Communication  : Called wife Alexander [name] [phone] on 04/12/2020 at 8:58 AM.  Mailbox full.  Code Status :  Full  Consults  :  None  Procedures  :   CTA - No PE  TTE -   Head CT - Non Acute  PUD Prophylaxis : None  Disposition Plan  :    Status is: Inpt  Dispo: The patient is from: Home              Anticipated d/c is to: Home              Anticipated d/c date is: 2 days              Patient currently is not medically stable to  d/c.   DVT Prophylaxis  :  Lovenox    Lab Results  Component Value Date   PLT 203 04/12/2020    Diet :  Diet Order            Diet Heart Room service appropriate? Yes; Fluid consistency: Thin  Diet effective now                  Inpatient Medications  Scheduled Meds: . enoxaparin (LOVENOX) injection  40 mg Subcutaneous Q24H  . linaclotide  72 mcg Oral QAC breakfast  . memantine  5 mg Oral BID  . mometasone-formoterol  2 puff Inhalation BID  . multivitamin with minerals  1 tablet Oral Daily   Continuous Infusions: . sodium chloride 75 mL/hr at 04/11/20 1543  . remdesivir 100 mg in NS 100 mL     PRN Meds:.albuterol, bisacodyl, guaiFENesin-dextromethorphan, ondansetron **OR** ondansetron (ZOFRAN) IV  Antibiotics  :    Anti-infectives (From admission, onward)   Start     Dose/Rate Route Frequency Ordered Stop   04/12/20 1000  remdesivir 100 mg in sodium chloride 0.9 % 100  mL IVPB       "Followed by" Linked Group Details   100 mg 200 mL/hr over 30 Minutes Intravenous Daily 04/11/20 1416 04/16/20 0959   04/11/20 1415  remdesivir 200 mg in sodium chloride 0.9% 250 mL IVPB       "Followed by" Linked Group Details   200 mg 580 mL/hr over 30 Minutes Intravenous Once 04/11/20 1416 04/11/20 1708       Time Spent in minutes  30   Susa RaringPrashant Aurelia Gras M.D on 04/12/2020 at 8:57 AM  To page go to [email or web address]   [facility] -  Office  [phone]   See all Orders from today for further details    Objective:   Vitals:   04/12/20 0535 04/12/20 0730 04/12/20 0745 04/12/20 0800  BP: (!) 102/56 113/72 (!) 118/58 117/63  Pulse: 71 86 82 72  Resp: (!) 22 (!) 23 15 (!) 21  Temp:      TempSrc:      SpO2: 93% 95% 97% 94%    Wt Readings from Last 3 Encounters:  03/30/20 64.3 kg  03/17/20 64.9 kg  11/05/19 66.2 kg    No intake or output data in the 24 hours ending 04/12/20 0857   Physical Exam  Awake Alert, No new F.N deficits, Normal  affect [place].AT,PERRAL Supple Neck,No JVD, No cervical lymphadenopathy appriciated.  Symmetrical Chest wall movement, Good air movement bilaterally, CTAB RRR,No Gallops,Rubs or new Murmurs, No Parasternal Heave +ve B.Sounds, Abd Soft, No tenderness, No organomegaly appriciated, No rebound - guarding or rigidity. No Cyanosis, Clubbing or edema, No new Rash or bruise       Data Review:    CBC Recent Labs  Lab 04/11/20 0910 04/11/20 1515 04/12/20 0500  WBC 7.0 6.7 5.2  HGB 11.7* 11.5* 12.0*  HCT 37.1* 36.7* 37.9*  PLT 217 231 203  MCV 96.1 96.8 95.5  MCH 30.3 30.3 30.2  MCHC 31.5 31.3 31.7  RDW 12.5 12.5 12.3  LYMPHSABS 1.0  --  0.8  MONOABS 1.3*  --  0.9  EOSABS 0.0  --  0.0  BASOSABS 0.0  --  0.0    Recent Labs  Lab 04/11/20 0910 04/11/20 1515 04/12/20 0500  NA 137  --  138  K 3.9  --  3.8  CL 101  --  102  CO2 24  --  27  GLUCOSE 95  --  86  BUN 13  --  13  CREATININE 1.09 1.09 1.03  CALCIUM 8.3*  --  8.6*  AST 19  --  21  ALT 14  --  14  ALKPHOS 50  --  49  BILITOT 0.6  --  0.6  ALBUMIN 3.3*  --  3.0*  MG  --  1.9 2.1  CRP 3.3*  --  6.6*  DDIMER 0.62* 0.77*  --   PROCALCITON <0.10 <0.10  --   LATICACIDVEN 1.1  --   --     ------------------------------------------------------------------------------------------------------------------ Recent Labs    04/11/20 0910  TRIG 78    Lab Results  Component Value Date   HGBA1C 5.6 05/30/2018   ------------------------------------------------------------------------------------------------------------------ No results for input(s): TSH, T4TOTAL, T3FREE, THYROIDAB in the last 72 hours.  Invalid input(s): FREET3  Cardiac Enzymes No results for input(s): CKMB, TROPONINI, MYOGLOBIN in the last 168 hours.  Invalid input(s): CK ------------------------------------------------------------------------------------------------------------------    Component Value Date/Time   BNP 66.9 07/09/2019 1157      Micro Results Recent Results (from the past 240 hour(s))  Culture,  blood (routine x 2)     Status: None (Preliminary result)   Collection Time: 04/11/20  9:15 AM   Specimen: BLOOD  Result Value Ref Range Status   Specimen Description BLOOD LEFT ANTECUBITAL  Final   Special Requests   Final    BOTTLES DRAWN AEROBIC AND ANAEROBIC Blood Culture adequate volume   Culture   Final    NO GROWTH < 24 HOURS Performed at [facility], [address], [place], [place] [address]    Report Status PENDING  Incomplete  Resp Panel by RT-PCR (Flu A&B, Covid) Nasopharyngeal Swab     Status: Abnormal   Collection Time: 04/11/20  9:17 AM   Specimen: Nasopharyngeal Swab; Nasopharyngeal(NP) swabs in vial transport medium  Result Value Ref Range Status   SARS Coronavirus 2 by RT PCR POSITIVE (A) NEGATIVE Final    Comment: RESULT CALLED TO, READ BACK BY AND VERIFIED WITH: KATIE RN, AT 1127 04/11/20 D. VANHOOK (NOTE) SARS-CoV-2 target nucleic acids are DETECTED.  The SARS-CoV-2 RNA is generally detectable in upper respiratory specimens during the acute phase of infection. Positive results are indicative of the presence of the identified virus, but do not rule out bacterial infection or co-infection with other pathogens not detected by the test. Clinical correlation with patient history and other diagnostic information is necessary to determine patient infection status. The expected result is Negative.  Fact Sheet for Patients: BloggerCourse.com  Fact Sheet for Healthcare Providers: SeriousBroker.it  This test is not yet approved or cleared by the [place] FDA and  has been authorized for detection and/or diagnosis of SARS-CoV-2 by FDA under an Emergency Use Authorization (EUA).  This EUA will remain in effect (meaning this test ca n be used) for the duration of  the COVID-19 declaration under Section 564(b)(1) of the Act, 21 U.S.C.  section 360bbb-3(b)(1), unless the authorization is terminated or revoked sooner.     Influenza A by PCR NEGATIVE NEGATIVE Final   Influenza B by PCR NEGATIVE NEGATIVE Final    Comment: (NOTE) The Xpert Xpress SARS-CoV-2/FLU/RSV plus assay is intended as an aid in the diagnosis of influenza from Nasopharyngeal swab specimens and should not be used as a sole basis for treatment. Nasal washings and aspirates are unacceptable for Xpert Xpress SARS-CoV-2/FLU/RSV testing.  Fact Sheet for Patients: BloggerCourse.com  Fact Sheet for Healthcare Providers: SeriousBroker.it  This test is not yet approved or cleared by the [place] FDA and has been authorized for detection and/or diagnosis of SARS-CoV-2 by FDA under an Emergency Use Authorization (EUA). This EUA will remain in effect (meaning this test can be used) for the duration of the COVID-19 declaration under Section 564(b)(1) of the Act, 21 U.S.C. section 360bbb-3(b)(1), unless the authorization is terminated or revoked.  Performed at [facility], [address], [place], [place] [address]   Culture, blood (routine x 2)     Status: None (Preliminary result)   Collection Time: 04/11/20  9:27 AM   Specimen: BLOOD LEFT WRIST  Result Value Ref Range Status   Specimen Description BLOOD LEFT WRIST  Final   Special Requests   Final    BOTTLES DRAWN AEROBIC AND ANAEROBIC Blood Culture results may not be optimal due to an inadequate volume of blood received in culture bottles   Culture   Final    NO GROWTH < 24 HOURS Performed at [facility], [address], [place], [place] [address]    Report Status PENDING  Incomplete  Radiology Reports CT HEAD WO CONTRAST  Result Date: 04/11/2020 CLINICAL DATA:  Syncope, dementia EXAM: CT HEAD WITHOUT CONTRAST TECHNIQUE: Contiguous axial images were obtained from the base of the skull through the vertex without intravenous  contrast. COMPARISON:  02/07/2014 FINDINGS: Brain: Hypodensities in the periventricular white matter consistent with chronic small vessel ischemic changes, grossly stable since previous MRI. No signs of acute infarct or hemorrhage. Lateral ventricles and remaining midline structures are unremarkable. No acute extra-axial fluid collections. No mass effect. Vascular: No hyperdense vessel or unexpected calcification. Skull: Normal. Negative for fracture or focal lesion. Sinuses/Orbits: Gas fluid levels and mucosal thickening within the bilateral maxillary sinuses and sphenoid sinus. Diffuse mucoperiosteal thickening throughout the ethmoid air cells. Other: None. IMPRESSION: 1. Chronic small vessel ischemic changes, no acute intracranial process. 2. Acute on chronic paranasal sinus disease. Electronically Signed   By: Sharlet Salina M.D.   On: 04/11/2020 16:51   CT ANGIO CHEST PE W OR WO CONTRAST  Result Date: 04/11/2020 CLINICAL DATA:  Syncope.  Concern for pulmonary embolism. EXAM: CT ANGIOGRAPHY CHEST WITH CONTRAST TECHNIQUE: Multidetector CT imaging of the chest was performed using the standard protocol during bolus administration of intravenous contrast. Multiplanar CT image reconstructions and MIPs were obtained to evaluate the vascular anatomy. CONTRAST:  33mL OMNIPAQUE IOHEXOL 350 MG/ML SOLN COMPARISON:  None. FINDINGS: Cardiovascular: Contrast injection is sufficient to demonstrate satisfactory opacification of the pulmonary arteries to the segmental level. There is no pulmonary embolus or evidence of right heart strain. The size of the main pulmonary artery is normal. Heart size is normal, with no pericardial effusion. The course and caliber of the aorta are normal. There is mild atherosclerotic calcification. Opacification decreased due to pulmonary arterial phase contrast bolus timing. Mediastinum/Nodes: -- No mediastinal lymphadenopathy. -- No hilar lymphadenopathy. -- No axillary lymphadenopathy. --  No supraclavicular lymphadenopathy. -- Normal thyroid gland where visualized. -  Unremarkable esophagus. Lungs/Pleura: There are emphysematous changes. Metallic foreign bodies are noted in the right upper lobe. There is a dominant metallic foreign body in the posterior right lower lobe. Upper Abdomen: There is no acute abnormality in the upper abdomen. Musculoskeletal: No chest wall abnormality. No bony spinal canal stenosis. Review of the MIP images confirms the above findings. IMPRESSION: No evidence of acute pulmonary embolism. Aortic Atherosclerosis (ICD10-I70.0) and Emphysema (ICD10-J43.9). Electronically Signed   By: Katherine Mantle M.D.   On: 04/11/2020 16:46   DG Chest Port 1 View  Result Date: 04/12/2020 CLINICAL DATA:  Low-grade fever.  Cough. EXAM: PORTABLE CHEST 1 VIEW COMPARISON:  CT 04/11/2020.  Chest x-ray 04/11/2020. FINDINGS: Mediastinum and hilar structures normal. Heart size normal. No focal infiltrate. No pleural effusion or pneumothorax. Shrapnel again noted over the right chest. IMPRESSION: No acute cardiopulmonary disease. No evidence of infiltrate noted on today's exam. Electronically Signed   By: Maisie Fus  Register   On: 04/12/2020 07:57   DG Chest Portable 1 View  Result Date: 04/11/2020 CLINICAL DATA:  Cough and fever.  Syncope. EXAM: PORTABLE CHEST 1 VIEW COMPARISON:  July 09, 2019 FINDINGS: The cardiomediastinal silhouette is stable. No pneumothorax. Elevation right hemidiaphragm similar since March 2021. Probable mild layering effusion with atelectasis. No definite infiltrate. IMPRESSION: Probable mild layering effusion with atelectasis in the right base. No definite infiltrate. Electronically Signed   By: Gerome Sam III M.D   On: 04/11/2020 10:13

## 2020-04-12 NOTE — Progress Notes (Signed)
  Echocardiogram 2D Echocardiogram has been performed.  Alexander [name] Alexander [name] 04/12/2020, 1:56 PM

## 2020-04-12 NOTE — ED Notes (Signed)
ECHO in process at this time 

## 2020-04-13 LAB — CBC WITH DIFFERENTIAL/PLATELET
Abs Immature Granulocytes: 0.01 10*3/uL (ref 0.00–0.07)
Basophils Absolute: 0 10*3/uL (ref 0.0–0.1)
Basophils Relative: 0 %
Eosinophils Absolute: 0 10*3/uL (ref 0.0–0.5)
Eosinophils Relative: 0 %
HCT: 39.5 % (ref 39.0–52.0)
Hemoglobin: 13.1 g/dL (ref 13.0–17.0)
Immature Granulocytes: 0 %
Lymphocytes Relative: 21 %
Lymphs Abs: 1.1 10*3/uL (ref 0.7–4.0)
MCH: 31 pg (ref 26.0–34.0)
MCHC: 33.2 g/dL (ref 30.0–36.0)
MCV: 93.4 fL (ref 80.0–100.0)
Monocytes Absolute: 0.9 10*3/uL (ref 0.1–1.0)
Monocytes Relative: 18 %
Neutro Abs: 3.3 10*3/uL (ref 1.7–7.7)
Neutrophils Relative %: 61 %
Platelets: 254 10*3/uL (ref 150–400)
RBC: 4.23 MIL/uL (ref 4.22–5.81)
RDW: 12.4 % (ref 11.5–15.5)
WBC: 5.4 10*3/uL (ref 4.0–10.5)
nRBC: 0 % (ref 0.0–0.2)

## 2020-04-13 LAB — COMPREHENSIVE METABOLIC PANEL
ALT: 16 U/L (ref 0–44)
AST: 27 U/L (ref 15–41)
Albumin: 3.6 g/dL (ref 3.5–5.0)
Alkaline Phosphatase: 55 U/L (ref 38–126)
Anion gap: 10 (ref 5–15)
BUN: 15 mg/dL (ref 8–23)
CO2: 28 mmol/L (ref 22–32)
Calcium: 9.4 mg/dL (ref 8.9–10.3)
Chloride: 103 mmol/L (ref 98–111)
Creatinine, Ser: 0.93 mg/dL (ref 0.61–1.24)
GFR, Estimated: >60 mL/min (ref >60)
Glucose, Bld: 101 mg/dL — ABNORMAL HIGH (ref 70–99)
Potassium: 4.1 mmol/L (ref 3.5–5.1)
Sodium: 141 mmol/L (ref 135–145)
Total Bilirubin: 0.6 mg/dL (ref 0.3–1.2)
Total Protein: 6.4 g/dL — ABNORMAL LOW (ref 6.5–8.1)

## 2020-04-13 LAB — BLOOD CULTURE ID PANEL (REFLEXED) - BCID2

## 2020-04-13 LAB — MAGNESIUM: Magnesium: 2.2 mg/dL (ref 1.7–2.4)

## 2020-04-13 LAB — BRAIN NATRIURETIC PEPTIDE: B Natriuretic Peptide: 121.1 pg/mL — ABNORMAL HIGH (ref 0.0–100.0)

## 2020-04-13 LAB — C-REACTIVE PROTEIN: CRP: 6.4 mg/dL — ABNORMAL HIGH (ref <1.0)

## 2020-04-13 NOTE — ED Notes (Signed)
Pt again found to be wandering the halls in the ER. Pt has steady gait with no assistance. Resp even and unlabored. Escorted patient back to room and educated the patient on the importance of staying in room due to COVID status. Pt stated understanding.

## 2020-04-13 NOTE — ED Notes (Signed)
Pt found wandering out of the room without a mask on. Informed patient to stay in room due to COVID status. Pt stated understanding.

## 2020-04-13 NOTE — ED Notes (Signed)
Pt ambulatory with no complications. Resp even and unlabored. Pt's wife in lobby to escort patient home. Pt to be discharged, but family and patient is unwilling to wait for DC papers.

## 2020-04-13 NOTE — Progress Notes (Signed)
PHARMACY - PHYSICIAN COMMUNICATION CRITICAL VALUE ALERT - BLOOD CULTURE IDENTIFICATION (BCID)  Alexander [name] is an 86 y.o. male who presented to [facility] on 04/11/2020 with a chief complaint of fever/COVID-19  Assessment:   1/2 blood cultures growing Staphylococcus epidermidis.  Likely contaminant  Name of physician (or Provider) Contacted:  Dr. Toniann Fail  Current antibiotics: None  Changes to prescribed antibiotics recommended:  Likely contaminant--no changes needed.  Results for orders placed or performed during the hospital encounter of 04/11/20  Blood Culture ID Panel (Reflexed) (Collected: 04/11/2020  9:15 AM)  Result Value Ref Range   Enterococcus faecalis NOT DETECTED NOT DETECTED   Enterococcus Faecium NOT DETECTED NOT DETECTED   Listeria monocytogenes NOT DETECTED NOT DETECTED   Staphylococcus species DETECTED (A) NOT DETECTED   Staphylococcus aureus (BCID) NOT DETECTED NOT DETECTED   Staphylococcus epidermidis DETECTED (A) NOT DETECTED   Staphylococcus lugdunensis NOT DETECTED NOT DETECTED   Streptococcus species NOT DETECTED NOT DETECTED   Streptococcus agalactiae NOT DETECTED NOT DETECTED   Streptococcus pneumoniae NOT DETECTED NOT DETECTED   Streptococcus pyogenes NOT DETECTED NOT DETECTED   A.calcoaceticus-baumannii NOT DETECTED NOT DETECTED   Bacteroides fragilis NOT DETECTED NOT DETECTED   Enterobacterales NOT DETECTED NOT DETECTED   Enterobacter cloacae complex NOT DETECTED NOT DETECTED   Escherichia coli NOT DETECTED NOT DETECTED   Klebsiella aerogenes NOT DETECTED NOT DETECTED   Klebsiella oxytoca NOT DETECTED NOT DETECTED   Klebsiella pneumoniae NOT DETECTED NOT DETECTED   Proteus species NOT DETECTED NOT DETECTED   Salmonella species NOT DETECTED NOT DETECTED   Serratia marcescens NOT DETECTED NOT DETECTED   Haemophilus influenzae NOT DETECTED NOT DETECTED   Neisseria meningitidis NOT DETECTED NOT DETECTED   Pseudomonas aeruginosa NOT DETECTED  NOT DETECTED   Stenotrophomonas maltophilia NOT DETECTED NOT DETECTED   Candida albicans NOT DETECTED NOT DETECTED   Candida auris NOT DETECTED NOT DETECTED   Candida glabrata NOT DETECTED NOT DETECTED   Candida krusei NOT DETECTED NOT DETECTED   Candida parapsilosis NOT DETECTED NOT DETECTED   Candida tropicalis NOT DETECTED NOT DETECTED   Cryptococcus neoformans/gattii NOT DETECTED NOT DETECTED   Methicillin resistance mecA/C NOT DETECTED NOT DETECTED    [name] 04/13/2020  4:37 AM

## 2020-04-13 NOTE — Discharge Summary (Signed)
AMA  Patient at this time expresses desire to leave the Hospital immidiately, patient has been warned that this is not Medically advisable at this time, and can result in Medical complications like Death and Disability, patient understands and accepts the risks involved and assumes full responsibilty of this decision.  He was briefly seen this am, he was AAO x 3, walking in the room, we are waiting for PT-OT to see him, unfortunately even before they could see him and I could review his reports he had called him wife and was walking mask less in the hallway demanding the nurse to let him go ASAP and he signed out AMA.   Susa Raring M.D on 04/13/2020 at 8:57 AM  [facility]  Time < 30 minutes  Last Note Below                                                                      PROGRESS NOTE                                                                                                                                                                                                             Patient Demographics:    Alexander [name], is a 86 y.o. male, DOB - 1932/08/21, ZOX:[id number]  Outpatient Primary MD for the patient is Jarome Matin, MD    LOS - 0  Admit date - 04/11/2020    No chief complaint on file.      Brief Narrative (HPI from H&P)  Alexander Lince Stallingsis a 86 y.o.malewith history h/oCOPD- Gold III, dementia, hypertension, CAD, BPH, s/p COVID vaccination x 2 early this year (Jan/Feb), was due for booster around this time but didn't schedule yet --now presents after a syncopal event witnessed by wife.  In the ER he was diagnosed with fever, COVID-19 infection and admitted to the hospital.    Subjective:    Haywood Filler today has, No headache, No chest pain, No abdominal  pain - No Nausea, No new weakness  tingling or numbness, mild cough but no SOB.   Assessment  & Plan :     1. Acute Covid 19 Viral Infection  - he has had 2 vaccine shots but has not received his third booster yet, so far does not appear to have any pulmonary involvement, has been started on remdesivir IV upon admission which is being continued.  Inflammatory markers are rising and I will closely monitor, he was febrile in the ER and CRP is rising, will start low-dose steroids and monitor closely.  Encouraged the patient to sit up in chair in the daytime use I-S and flutter valve for pulmonary toiletry and then prone in bed when at night.  Will advance activity and titrate down oxygen as possible.  SpO2: 94 %  Last Labs         Recent Labs  Lab 04/11/20 0910 04/11/20 0917 04/11/20 1515 04/12/20 0500  WBC 7.0  --  6.7 5.2  HGB 11.7*  --  11.5* 12.0*  HCT 37.1*  --  36.7* 37.9*  PLT 217  --  231 203  CRP 3.3*  --   --  6.6*  DDIMER 0.62*  --  0.77*  --   PROCALCITON <0.10  --  <0.10  --   AST 19  --   --  21  ALT 14  --   --  14  ALKPHOS 50  --   --  49  BILITOT 0.6  --   --  0.6  ALBUMIN 3.3*  --   --  3.0*  LATICACIDVEN 1.1  --   --   --   SARSCOV2NAA  --  POSITIVE*  --   --        2. Syncope with loss of consciousness at home.  While he was standing, does not appear to be consistent with seizure activity, no focal deficits or headache, CT head nonacute.  Could have been orthostatic, monitor orthostatics, hydrate IV fluids, check echocardiogram, PT OT evaluation.  Lives with wife at home and currently till fully evaluated and COVID deemed to be stable cannot be safely discharged.  3.  Mild early dementia.  At risk for delirium, minimize narcotics and benzodiazepines.  Supportive care.  4.  COPD.  Supportive care follows with Dr. Sherene SiresWert.  5.  Essential hypertension.  Blood pressure borderline.  No medications for now.  6.  History of CAD and LVH.  Repeat echo pending, no acute issues, no chest pain,  mild non-ACS pattern elevation in troponin.  Trend is stable.  No chest pain, nonspecific EKG changes.  Placed on aspirin.  Will check echocardiogram for any wall motion abnormalities and to evaluate EF.  Results for Alexander [name], Lael L (MRN [id number]) as of 04/12/2020 09:15  Ref. Range 07/09/2019 11:57 04/11/2020 09:10 04/11/2020 15:15 04/11/2020 19:09  Troponin I (High Sensitivity) Latest Ref Range: <18 ng/L 11  26 (H) 32 (H)      Condition - Extremely Guarded  Family Communication  : Called wife [name] [phone] on 04/12/2020 at 8:58 AM.  Mailbox full.  Code Status :  Full  Consults  :  None  Procedures  :   CTA - No PE  TTE -   Head CT - Non Acute  PUD Prophylaxis : None  Disposition Plan  :    Status is: Inpt  Dispo: The patient is from: Home  Anticipated d/c is to: Home  Anticipated d/c date is: 2 days  Patient currently is not medically stable to d/c.   DVT Prophylaxis  :  Lovenox    Recent Labs       Lab Results  Component Value Date   PLT 203 04/12/2020      Diet :     Diet Order                  Diet Heart Room service appropriate? Yes; Fluid consistency: Thin  Diet effective now                   Inpatient Medications  Scheduled Meds: . enoxaparin (LOVENOX) injection  40 mg Subcutaneous Q24H  . linaclotide  72 mcg Oral QAC breakfast  . memantine  5 mg Oral BID  . mometasone-formoterol  2 puff Inhalation BID  . multivitamin with minerals  1 tablet Oral Daily   Continuous Infusions: . sodium chloride 75 mL/hr at 04/11/20 1543  . remdesivir 100 mg in NS 100 mL     PRN Meds:.albuterol, bisacodyl, guaiFENesin-dextromethorphan, ondansetron **OR** ondansetron (ZOFRAN) IV  Antibiotics  :               Anti-infectives (From admission, onward)     Start     Dose/Rate Route Frequency Ordered Stop   04/12/20 1000  remdesivir 100 mg in sodium chloride 0.9 % 100 mL  IVPB       "Followed by" Linked Group Details   100 mg 200 mL/hr over 30 Minutes Intravenous Daily 04/11/20 1416 04/16/20 0959   04/11/20 1415  remdesivir 200 mg in sodium chloride 0.9% 250 mL IVPB       "Followed by" Linked Group Details   200 mg 580 mL/hr over 30 Minutes Intravenous Once 04/11/20 1416 04/11/20 1708         Time Spent in minutes  30   Susa Raring M.D on 04/12/2020 at 8:57 AM  To page go to [email or web address]   [facility] -  Office  [phone]   See all Orders from today for further details    Objective:         Vitals:   04/12/20 0535 04/12/20 0730 04/12/20 0745 04/12/20 0800  BP: (!) 102/56 113/72 (!) 118/58 117/63  Pulse: 71 86 82 72  Resp: (!) 22 (!) 23 15 (!) 21  Temp:      TempSrc:      SpO2: 93% 95% 97% 94%       Wt Readings from Last 3 Encounters:  03/30/20 64.3 kg  03/17/20 64.9 kg  11/05/19 66.2 kg    No intake or output data in the 24 hours ending 04/12/20 0857   Physical Exam  Awake Alert, No new F.N deficits, Normal affect [place].AT,PERRAL Supple Neck,No JVD, No cervical lymphadenopathy appriciated.  Symmetrical Chest wall movement, Good air movement bilaterally, CTAB RRR,No Gallops,Rubs or new Murmurs, No Parasternal Heave +ve B.Sounds, Abd Soft, No tenderness, No organomegaly appriciated, No rebound - guarding or rigidity. No Cyanosis, Clubbing or edema, No new Rash or bruise       Data Review:    CBC Last Labs        Recent Labs  Lab 04/11/20 0910 04/11/20 1515 04/12/20 0500  WBC 7.0 6.7 5.2  HGB 11.7* 11.5* 12.0*  HCT 37.1* 36.7* 37.9*  PLT 217 231 203  MCV 96.1 96.8 95.5  MCH 30.3 30.3 30.2  MCHC 31.5 31.3 31.7  RDW 12.5 12.5 12.3  LYMPHSABS 1.0  --  0.8  MONOABS 1.3*  --  0.9  EOSABS 0.0  --  0.0  BASOSABS 0.0  --  0.0      Last Labs        Recent Labs  Lab 04/11/20 0910 04/11/20 1515 04/12/20 0500  NA 137  --  138  K 3.9  --  3.8  CL 101  --  102   CO2 24  --  27  GLUCOSE 95  --  86  BUN 13  --  13  CREATININE 1.09 1.09 1.03  CALCIUM 8.3*  --  8.6*  AST 19  --  21  ALT 14  --  14  ALKPHOS 50  --  49  BILITOT 0.6  --  0.6  ALBUMIN 3.3*  --  3.0*  MG  --  1.9 2.1  CRP 3.3*  --  6.6*  DDIMER 0.62* 0.77*  --   PROCALCITON <0.10 <0.10  --   LATICACIDVEN 1.1  --   --       ------------------------------------------------------------------------------------------------------------------ Recent Labs (last 2 labs)      Recent Labs    04/11/20 0910  TRIG 78      Recent Labs       Lab Results  Component Value Date   HGBA1C 5.6 05/30/2018     ------------------------------------------------------------------------------------------------------------------  Recent Labs (last 2 labs)   No results for input(s): TSH, T4TOTAL, T3FREE, THYROIDAB in the last 72 hours.  Invalid input(s): FREET3    Cardiac Enzymes  Last Labs   No results for input(s): CKMB, TROPONINI, MYOGLOBIN in the last 168 hours.  Invalid input(s): CK   ------------------------------------------------------------------------------------------------------------------ Labs (Brief)          Component Value Date/Time   BNP 66.9 07/09/2019 1157       Micro Results        Recent Results (from the past 240 hour(s))  Culture, blood (routine x 2)     Status: None (Preliminary result)   Collection Time: 04/11/20  9:15 AM   Specimen: BLOOD  Result Value Ref Range Status   Specimen Description BLOOD LEFT ANTECUBITAL  Final   Special Requests   Final    BOTTLES DRAWN AEROBIC AND ANAEROBIC Blood Culture adequate volume   Culture   Final    NO GROWTH < 24 HOURS Performed at [facility], [address] [place], [place] [address]    Report Status PENDING  Incomplete  Resp Panel by RT-PCR (Flu A&B, Covid) Nasopharyngeal Swab     Status: Abnormal   Collection Time: 04/11/20  9:17 AM   Specimen: Nasopharyngeal Swab;  Nasopharyngeal(NP) swabs in vial transport medium  Result Value Ref Range Status   SARS Coronavirus 2 by RT PCR POSITIVE (A) NEGATIVE Final    Comment: RESULT CALLED TO, READ BACK BY AND VERIFIED WITH: KATIE RN, AT 1127 04/11/20 D. VANHOOK (NOTE) SARS-CoV-2 target nucleic acids are DETECTED.  The SARS-CoV-2 RNA is generally detectable in upper respiratory specimens during the acute phase of infection. Positive results are indicative of the presence of the identified virus, but do not rule out bacterial infection or co-infection with other pathogens not detected by the test. Clinical correlation with patient history and other diagnostic information is necessary to determine patient infection status. The expected result is Negative.  Fact Sheet for Patients: BloggerCourse.com  Fact Sheet for Healthcare Providers: SeriousBroker.it  This test is not yet approved or cleared by the [place] FDA and  has been authorized for detection and/or diagnosis of SARS-CoV-2 by FDA under  an Emergency Use Authorization (EUA).  This EUA will remain in effect (meaning this test ca n be used) for the duration of  the COVID-19 declaration under Section 564(b)(1) of the Act, 21 U.S.C. section 360bbb-3(b)(1), unless the authorization is terminated or revoked sooner.     Influenza A by PCR NEGATIVE NEGATIVE Final   Influenza B by PCR NEGATIVE NEGATIVE Final    Comment: (NOTE) The Xpert Xpress SARS-CoV-2/FLU/RSV plus assay is intended as an aid in the diagnosis of influenza from Nasopharyngeal swab specimens and should not be used as a sole basis for treatment. Nasal washings and aspirates are unacceptable for Xpert Xpress SARS-CoV-2/FLU/RSV testing.  Fact Sheet for Patients: BloggerCourse.com  Fact Sheet for Healthcare Providers: SeriousBroker.it  This test is not yet approved or  cleared by the [place] FDA and has been authorized for detection and/or diagnosis of SARS-CoV-2 by FDA under an Emergency Use Authorization (EUA). This EUA will remain in effect (meaning this test can be used) for the duration of the COVID-19 declaration under Section 564(b)(1) of the Act, 21 U.S.C. section 360bbb-3(b)(1), unless the authorization is terminated or revoked.  Performed at [facility], [address] [place], [place] [address]   Culture, blood (routine x 2)     Status: None (Preliminary result)   Collection Time: 04/11/20  9:27 AM   Specimen: BLOOD LEFT WRIST  Result Value Ref Range Status   Specimen Description BLOOD LEFT WRIST  Final   Special Requests   Final    BOTTLES DRAWN AEROBIC AND ANAEROBIC Blood Culture results may not be optimal due to an inadequate volume of blood received in culture bottles   Culture   Final    NO GROWTH < 24 HOURS Performed at [facility], [address], [place], [place] [address]    Report Status PENDING  Incomplete    Radiology Reports  Imaging Results

## 2020-04-15 LAB — CULTURE, BLOOD (ROUTINE X 2): Special Requests: ADEQUATE

## 2020-04-16 LAB — CULTURE, BLOOD (ROUTINE X 2): Culture: NO GROWTH

## 2020-04-19 ENCOUNTER — Other Ambulatory Visit: Payer: Self-pay | Admitting: Internal Medicine

## 2020-05-14 DIAGNOSIS — M19012 Primary osteoarthritis, left shoulder: Secondary | ICD-10-CM | POA: Diagnosis not present

## 2020-06-05 ENCOUNTER — Encounter (HOSPITAL_COMMUNITY): Payer: Self-pay

## 2020-06-05 ENCOUNTER — Emergency Department (HOSPITAL_COMMUNITY)
Admission: EM | Admit: 2020-06-05 | Discharge: 2020-06-05 | Disposition: A | Discharge status: Home / Self Care | Payer: PPO | Attending: Emergency Medicine | Admitting: Emergency Medicine

## 2020-06-05 ENCOUNTER — Other Ambulatory Visit: Payer: Self-pay

## 2020-06-05 ENCOUNTER — Ambulatory Visit (INDEPENDENT_AMBULATORY_CARE_PROVIDER_SITE_OTHER)
Admission: EM | Admit: 2020-06-05 | Discharge: 2020-06-05 | Disposition: A | Discharge status: Home / Self Care | Payer: PPO | Source: Home / Self Care

## 2020-06-05 ENCOUNTER — Emergency Department (HOSPITAL_COMMUNITY): Payer: PPO

## 2020-06-05 ENCOUNTER — Encounter (HOSPITAL_COMMUNITY): Payer: Self-pay | Admitting: Emergency Medicine

## 2020-06-05 DIAGNOSIS — R519 Headache, unspecified: Secondary | ICD-10-CM

## 2020-06-05 DIAGNOSIS — R0602 Shortness of breath: Secondary | ICD-10-CM | POA: Insufficient documentation

## 2020-06-05 DIAGNOSIS — Z8616 Personal history of COVID-19: Secondary | ICD-10-CM | POA: Insufficient documentation

## 2020-06-05 DIAGNOSIS — F0281 Dementia in other diseases classified elsewhere with behavioral disturbance: Secondary | ICD-10-CM | POA: Insufficient documentation

## 2020-06-05 DIAGNOSIS — R42 Dizziness and giddiness: Secondary | ICD-10-CM

## 2020-06-05 DIAGNOSIS — Z7951 Long term (current) use of inhaled steroids: Secondary | ICD-10-CM | POA: Insufficient documentation

## 2020-06-05 DIAGNOSIS — R350 Frequency of micturition: Secondary | ICD-10-CM | POA: Diagnosis not present

## 2020-06-05 DIAGNOSIS — I2510 Atherosclerotic heart disease of native coronary artery without angina pectoris: Secondary | ICD-10-CM | POA: Diagnosis not present

## 2020-06-05 DIAGNOSIS — G309 Alzheimer's disease, unspecified: Secondary | ICD-10-CM | POA: Insufficient documentation

## 2020-06-05 DIAGNOSIS — J449 Chronic obstructive pulmonary disease, unspecified: Secondary | ICD-10-CM | POA: Diagnosis not present

## 2020-06-05 DIAGNOSIS — Z87891 Personal history of nicotine dependence: Secondary | ICD-10-CM | POA: Diagnosis not present

## 2020-06-05 DIAGNOSIS — J45909 Unspecified asthma, uncomplicated: Secondary | ICD-10-CM | POA: Insufficient documentation

## 2020-06-05 DIAGNOSIS — I119 Hypertensive heart disease without heart failure: Secondary | ICD-10-CM | POA: Insufficient documentation

## 2020-06-05 DIAGNOSIS — R001 Bradycardia, unspecified: Secondary | ICD-10-CM | POA: Diagnosis not present

## 2020-06-05 HISTORY — DX: Unspecified dementia, unspecified severity, without behavioral disturbance, psychotic disturbance, mood disturbance, and anxiety: F03.90

## 2020-06-05 LAB — BASIC METABOLIC PANEL
Anion gap: 12 (ref 5–15)
BUN: 12 mg/dL (ref 8–23)
CO2: 26 mmol/L (ref 22–32)
Calcium: 9.8 mg/dL (ref 8.9–10.3)
Chloride: 103 mmol/L (ref 98–111)
Creatinine, Ser: 1.01 mg/dL (ref 0.61–1.24)
GFR, Estimated: >60 mL/min (ref >60)
Glucose, Bld: 87 mg/dL (ref 70–99)
Potassium: 4.1 mmol/L (ref 3.5–5.1)
Sodium: 141 mmol/L (ref 135–145)

## 2020-06-05 LAB — URINALYSIS, ROUTINE W REFLEX MICROSCOPIC
Bilirubin Urine: NEGATIVE
Glucose, UA: NEGATIVE mg/dL
Ketones, ur: NEGATIVE mg/dL
Leukocytes,Ua: NEGATIVE
Nitrite: NEGATIVE
Protein, ur: NEGATIVE mg/dL
Specific Gravity, Urine: 1.01 (ref 1.005–1.030)
pH: 6 (ref 5.0–8.0)

## 2020-06-05 LAB — POCT URINALYSIS DIPSTICK, ED / UC
Bilirubin Urine: NEGATIVE
Glucose, UA: NEGATIVE mg/dL
Ketones, ur: NEGATIVE mg/dL
Leukocytes,Ua: NEGATIVE
Nitrite: NEGATIVE
Protein, ur: NEGATIVE mg/dL
Specific Gravity, Urine: 1.01 (ref 1.005–1.030)
Urobilinogen, UA: 0.2 mg/dL (ref 0.0–1.0)
pH: 5.5 (ref 5.0–8.0)

## 2020-06-05 LAB — CBC
HCT: 43.7 % (ref 39.0–52.0)
Hemoglobin: 13.5 g/dL (ref 13.0–17.0)
MCH: 30.3 pg (ref 26.0–34.0)
MCHC: 30.9 g/dL (ref 30.0–36.0)
MCV: 98.2 fL (ref 80.0–100.0)
Platelets: 272 10*3/uL (ref 150–400)
RBC: 4.45 MIL/uL (ref 4.22–5.81)
RDW: 12.9 % (ref 11.5–15.5)
WBC: 4.7 10*3/uL (ref 4.0–10.5)
nRBC: 0 % (ref 0.0–0.2)

## 2020-06-05 LAB — URINALYSIS, MICROSCOPIC (REFLEX)
Bacteria, UA: NONE SEEN
RBC / HPF: NONE SEEN RBC/hpf (ref 0–5)
Squamous Epithelial / HPF: NONE SEEN (ref 0–5)

## 2020-06-05 LAB — CBG MONITORING, ED: Glucose-Capillary: 81 mg/dL (ref 70–99)

## 2020-06-05 NOTE — ED Triage Notes (Signed)
Pt reports feeling he was going to pass out yesterday. He states he was going to eat breakfast when he felt dizzy. His wife states he was not himself. Pt denies chest pain, blurry vision. He states his head hurts sometimes.
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
## 2020-06-05 NOTE — ED Notes (Signed)
Patient denies pain and is resting comfortably.  

## 2020-06-05 NOTE — ED Notes (Signed)
Pt back from X-ray.  

## 2020-06-05 NOTE — ED Notes (Signed)
Patient transported to X-ray prior to being placed in room 29.

## 2020-06-05 NOTE — ED Notes (Signed)
Reported chief complaint to Dr. Tracie Harrier. He states it is ok for the pt to be seen here.

## 2020-06-05 NOTE — ED Triage Notes (Addendum)
Pt states he is here for intermittent SOB that has been going on "for awhile".  Wife arrives to triage room and reports he is here for dizziness that started this morning.  Pt denies dizziness at present.  No arm drift.   Pt sent from [facility]- Per [facility] note: "Wife states that this morning before breakfast pt felt as if he was going to pass out but did not. He stated that he did not feel well and did not eat which is strange for him. He seemed a bit more confused than normal but not excessively. Wife thinks that he was just more quiet as he was not feeling well. In about 30 minutes on its own he improved. He states that dizziness is mild and he also has a mild headache. No slurred speech, muscle weakness, facial droop, vision changes, chest pains, SOB, dysuria but has had some mild urinary frequency for the past day. He is now back to his normal mental status. No history of hypoglycemia, no history of TIA/CVA/MI. He is not on blood thinners."
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
## 2020-06-05 NOTE — Discharge Instructions (Addendum)
Please go directly to the ER.

## 2020-06-05 NOTE — ED Notes (Signed)
Pt returned to room 29.

## 2020-06-05 NOTE — Discharge Instructions (Addendum)
Your lab work and imaging were all negative for abnormality today.  There does not appear to be any emergent cause of the symptoms she had today.  You likely became lightheaded because of the hot shower.  Sometimes taking a hot shower can lower your blood pressure.  Take cooler showers or give yourself some time to lie down afterward before getting up and moving around.  These follow closely with your primary Care doctor and return for the reasons listed below  Get help right away if: You vomit or have diarrhea and are unable to eat or drink anything. You have problems talking, walking, swallowing, or using your arms, hands, or legs. You feel generally weak. You are not thinking clearly or you have trouble forming sentences. It may take a friend or family member to notice this. You have chest pain, abdominal pain, shortness of breath, or sweating. Your vision changes. You have any bleeding. You have a severe headache. You have neck pain or a stiff neck. You have a fever.

## 2020-06-05 NOTE — ED Notes (Signed)
Patient is being discharged from the Urgent Care and sent to the Emergency Department via [facility] and POV . Per S. Rotan, [place] patient is in need of higher level of care due to dizziness, HA.  Patient is aware and verbalizes understanding of plan of care.  Vitals:   06/05/20 1015  BP: 122/66  Pulse: 68  Resp: 15  Temp: 97.7 F (36.5 C)  SpO2: 98%

## 2020-06-06 LAB — URINE CULTURE: Culture: NO GROWTH

## 2020-06-17 DIAGNOSIS — E785 Hyperlipidemia, unspecified: Secondary | ICD-10-CM | POA: Diagnosis not present

## 2020-06-17 DIAGNOSIS — Z125 Encounter for screening for malignant neoplasm of prostate: Secondary | ICD-10-CM | POA: Diagnosis not present

## 2020-06-24 DIAGNOSIS — E441 Mild protein-calorie malnutrition: Secondary | ICD-10-CM | POA: Diagnosis not present

## 2020-06-24 DIAGNOSIS — K5909 Other constipation: Secondary | ICD-10-CM | POA: Diagnosis not present

## 2020-06-24 DIAGNOSIS — L299 Pruritus, unspecified: Secondary | ICD-10-CM | POA: Diagnosis not present

## 2020-06-24 DIAGNOSIS — K219 Gastro-esophageal reflux disease without esophagitis: Secondary | ICD-10-CM | POA: Diagnosis not present

## 2020-06-24 DIAGNOSIS — H9193 Unspecified hearing loss, bilateral: Secondary | ICD-10-CM | POA: Diagnosis not present

## 2020-06-24 DIAGNOSIS — I10 Essential (primary) hypertension: Secondary | ICD-10-CM | POA: Diagnosis not present

## 2020-06-24 DIAGNOSIS — J449 Chronic obstructive pulmonary disease, unspecified: Secondary | ICD-10-CM | POA: Diagnosis not present

## 2020-06-24 DIAGNOSIS — Z0000 Encounter for general adult medical examination without abnormal findings: Secondary | ICD-10-CM | POA: Diagnosis not present

## 2020-06-28 ENCOUNTER — Ambulatory Visit: Payer: PPO | Admitting: Internal Medicine

## 2020-07-05 ENCOUNTER — Ambulatory Visit: Payer: PPO | Admitting: Internal Medicine

## 2020-07-05 ENCOUNTER — Encounter: Payer: Self-pay | Admitting: Internal Medicine

## 2020-07-05 ENCOUNTER — Other Ambulatory Visit: Payer: Self-pay

## 2020-07-05 DIAGNOSIS — J449 Chronic obstructive pulmonary disease, unspecified: Secondary | ICD-10-CM

## 2020-07-05 DIAGNOSIS — R058 Other specified cough: Secondary | ICD-10-CM | POA: Diagnosis not present

## 2020-07-05 NOTE — Progress Notes (Signed)
Subjective:   Patient ID: Alexander [name], male    DOB: 1932/05/01    MRN: [id number]     Brief patient profile:  87  yobm quit smoking 04/2000    gold III COPD     01/09/13  Cardiac evaluation - echo nml  Stress test - peak VO2 68%, limited by ventilation  c. cath [name]) -11/10 - nml LV fn, nml coronaries  Spirometry >>FEV1 1.14 - 45% -moderate airway obstruction       05/04/2017 acute extended ov/Wert re:  COPD III  Really not clear what meds he's been taking nor is his wife sure Chief Complaint  Patient presents with  . Acute Visit    productive cough with clear,gray sputum,SOB w/ activity,feels it has improved with the inhaler he uses  daily cough x 3 months, worse in am but all day long and doe = MMRC3 = can't walk 100 yards even at a slow pace at a flat grade s stopping due to sob  - still shops at [employer] but can't get around without resting  Assoc rhinitis/ nasal congestion  Very confused again with meds  rec Symbicort 80  Take 2 puffs first thing in am and then another 2 puffs about 12 hours later.  Pantoprazole (protonix) 40 mg   Take  30-60 min before first meal of the day and Pepcid (famotidine)  20 mg one @  bedtime until return to office Prednisone 10 mg take  4 each am x 2 days,   2 each am x 2 days,  1 each am x 2 days and stop   Augmentin 875 mg take one pill twice daily  X 10 days - take at breakfast and supper with large glass of water.  GERD diet  Work on inhaler technique   Please schedule a follow up office visit in 2 weeks, sooner if needed with all medications / inhalers / solutions    05/21/2017  f/u ov/Wert re:  COPD GOLD III / main problem is coughing > sob  Chief Complaint  Patient presents with  . Follow-up    Cough is about the same. He has not been looking at what color sputum he is producing. He still has abx left over, he has only been taking 1 tablet daily.   Cough was gone to his satisfaction until 3 days prior to OV  Then acute worse/ min  productive/ still on augmentin  Wife has same "cold"   pt brought symbicort 80 sample well past the red line/ again confused with meds Dspnea:  No change = MMRC3 = can't walk 100 yards even at a slow pace at a flat grade s stopping due to sob  / not using neb saba at all or following action plan  Sleep: disrupted by coughing fits now and has to sleep propped up  rec Plan A = Automatic = symbicort 80 Take 2 puffs first thing in am and then another 2 puffs about 12 hours later.  Work on inhaler technique:   Plan B = Backup Only use your albuterol as a rescue medicatio Best cough medicine > delsym 2 tsp every 12 hours and if still coughing supplement with Tylenol #3 Finish your antibiotics Please see patient coordinator before you leave today  to schedule sinus CT  Please remember to go to the lab department downstairs in the basement  for your tests - we will call you with the results when they are available.    Please  schedule a follow up office visit in 2 weeks,     06/04/2017  f/u ov/Wert re:  Ab/ sinusitis  Chief Complaint  Patient presents with  . Follow-up    Increased cough with grey sputum since the last visit.   using saba neb several times a day  Dyspnea:  MMRC2 = can't walk a nl pace on a flat grade s sob but does fine slow and flat  Cough: esp after supper variably productive min slt dark mucus Sleep: ok flat   Main complaints are related to nasal congestion   rec No change in medications for now Keep appt to see Dr Annalee Genta > rec FESS> did not do      03/11/2019  f/u ov/Wert re:  Copd III / ab did not bring meds, has not needed Plan D  Chief Complaint  Patient presents with  . Follow-up    Breathing is doing well. He states he is using his albuterol inhaler daily.   Dyspnea:  Still doing yard work Cough: none  Sleeping: ok flat, two pillows  SABA use: confused with how/ when to use "albuterol inhaler" and actually doesn't have one now per wife as was not refilled    02: none  rec Plan A = Automatic = Symbicort 80 Take 2 puffs first thing in am and then another 2 puffs about 12 hours later.  Work on Archivist:   Plan B = Backup Only use your albuterol inhaler as a rescue medication  Plan C = Crisis - only use your albuterol nebulizer if you first try Plan B and it fails to help > ok to use the nebulizer up to every 4 hours but if start needing it regularly call for immediate appointment Plan D = Deltasone (Prednisone)  - take this if need nebulizer more than rarely  Prednisone 10 mg take  4 each am x 2 days,   2 each am x 2 days,  1 each am x 2 days and stop    05/26/2019  f/u ov/Wert re: aecopd  Chief Complaint  Patient presents with  . Acute Visit    Pt c/o increased DOE x 2 wks. He also c/o cough with gray sputum.  He is using his proair 1-2 x per day.   Dyspnea:  Around the house / worse off gerd rx "it ran out and no one would refill it"  Cough: minimal am gray mucus  Sleeping: two pillows  SABA use: only 2 x daily hfa even with flare, neb tubing old/ not following action plans x for pred which seems to help some Rec Plan A = Automatic = Always=    symbicort or Breztri Take 2 puffs first thing in am and then another 2 puffs about 12 hours later.  Pantoprazole (protonix) 40 mg   Take  30-60 min before first meal of the day and Pepcid (famotidine)  40 mg one after supper until return to office - this is the best way to tell whether stomach acid is contributing to your problem.   Plan B = Backup (to supplement plan A, not to replace it) Only use your albuterol inhaler as a rescue medication Plan C = Crisis (instead of Plan B but only if Plan B stops working) - only use your albuterol nebulizer if you first try Plan B and it fails to help > ok to use the nebulizer up to every 4 hours but if start needing it regularly call for immediate appointment  Plan D = Deltasone = Prednisone  - Prednisone 10 mg take  4 each am x 2 days,   2  each am x 2 days,  1 each am x 2 days and stop  Please schedule a follow up office visit in 4 weeks, sooner if needed  with all medications /inhalers/ solutions in hand so we can verify exactly what you are taking. This includes all medications from all doctors and over the counters   07/08/2019  Acute ov/Wert re:all better until  worse sob/ cough/ wheeze / second shot  06/06/19  Chief Complaint  Patient presents with  . Acute Visit    Breathing worse x 4 days, wheezing, cough with green/gray sputum.    Dyspnea: increase since 07/05/19 with cough/ wheeze minimally discolored sputum   Sleeping: worse since onset of sob/ bed flat on 2 pillows  SABA use: hfa and neb but not following action plan written / reviewed t last ov  02: none  rec Plan A = Automatic = Always=    symbicort or Breztri Take 2 puffs first thing in am and then another 2 puffs about 12 hours later.  Continue pantoprazole (protonix) 40 mg   Take  30-60 min before first meal of the day and Pepcid (famotidine)  40 mg one after supper until return to office - this is the best way to tell whether stomach acid is contributing to your problem.   Plan B = Backup (to supplement plan A, not to replace it) Only use your albuterol inhaler as a rescue medication  Plan C = Crisis (instead of Plan B but only if Plan B stops working) - only use your albuterol nebulizer if you first try Plan B and it fails to help  Plan D = Deltasone = Prednisone  - Prednisone 10 mg take  4 each am x 2 days,   2 each am x 2 days,  1 each am x 2 days and stop  zpak     03/30/2020  f/u ov/Wert re: GOLD III copd  Chief Complaint  Patient presents with  . Follow-up    shortness of breath with activity  Dyspnea: mb and back is fine / yard work is fine / can't really identify a specific activity that makes him sob  Cough: daytime tickle ? pnds "I feel like I have a hole in my nose"  Sleeping: fine flat  SABA use:  A couple times a day then neb couple times a  week  02: none  rec Plan A = Automatic = Always=    Symbicort 80 Take 2 puffs first thing in am and then another 2 puffs about 12 hours later.  Work on inhaler technique:   Plan B = Backup (to supplement plan A, not to replace it) Only use your albuterol inhaler as a rescue medication Plan C = Crisis (instead of Plan B but only if Plan B stops working) - only use your albuterol nebulizer if you first try Plan B and it fails to help Plan D = Deltasone  - if ABC not working to your satisfaction then Prednisone 10 mg take  4 each am x 2 days,   2 each am x 2 days,  1 each am x 2 days and stop  ENT evaluation would be the next stop but but I'll defer this to Dr Eloise Harman  Please schedule a follow up visit in 3 months but call sooner if needed  with all medications /inhalers/ solutions  in hand so we can verify exactly what you are taking. This includes all medications from all doctors and over the counters   07/05/2020  f/u ov/Wert re:  Month since last prednisone  Chief Complaint  Patient presents with  . Follow-up    Cough up sputum and SOB  Dyspnea: still doing some yardwork  Cough: minimal thick white / always better p pred assoc with rhinitis Sleeping: bed is flat/ 2 pillows no resp issues or am cough/ congestion   SABA use: minal 02: none  Covid status:   vax x 2  And had covid    No obvious day to day or daytime variability or assoc  purulent sputum or mucus plugs or hemoptysis or cp or chest tightness, subjective wheeze or overt hb symptoms.   Sleeping  without nocturnal  or early am exacerbation  of respiratory  c/o's or need for noct saba. Also denies any obvious fluctuation of symptoms with weather or environmental changes or other aggravating or alleviating factors except as outlined above   No unusual exposure hx or h/o childhood pna/ asthma or knowledge of premature birth.  Current Allergies, Complete Past Medical History, Past Surgical History, Family History, and Social  History were reviewed in [facility] electronic medical record.  ROS  The following are not active complaints unless bolded Hoarseness, sore throat, dysphagia, dental problems, itching, sneezing,  nasal congestion or discharge of excess mucus or purulent secretions, ear ache,   fever, chills, sweats, unintended wt loss or wt gain, classically pleuritic or exertional cp,  orthopnea pnd or arm/hand swelling  or leg swelling, presyncope, palpitations, abdominal pain, anorexia, nausea, vomiting, diarrhea  or change in bowel habits or change in bladder habits, change in stools or change in urine, dysuria, hematuria,  rash, arthralgias, visual complaints, headache, numbness, weakness or ataxia or problems with walking or coordination,  change in mood or  memory.        Current Meds  Medication Sig  . albuterol (PROAIR HFA) 108 (90 Base) MCG/ACT inhaler 2 puffs every 4 hours as needed only  if your can't catch your breath (Patient taking differently: Inhale 2 puffs into the lungs every 4 (four) hours as needed for wheezing or shortness of breath. 2 puffs every 4 hours as needed only  if your can't catch your breath)  . albuterol (PROVENTIL) (2.5 MG/3ML) 0.083% nebulizer solution TAKE 3 MLS BY NEBULIZATION EVERY 6 HOURS AS NEEDED FOR WHEEZING OR SHORTNESS OF BREATH. (Patient taking differently: Take 2.5 mg by nebulization every 6 (six) hours as needed for wheezing or shortness of breath.)  . budesonide-formoterol (SYMBICORT) 80-4.5 MCG/ACT inhaler Inhale 2 puffs into the lungs 2 (two) times daily.  Marland Kitchen. donepezil (ARICEPT) 5 MG tablet Take 5 mg by mouth at bedtime.  Marland Kitchen. lactulose (CONSTULOSE) 10 GM/15ML solution Take 15 mLs (10 g total) by mouth daily as needed for mild constipation.  Marland Kitchen. linaclotide (LINZESS) 72 MCG capsule Take 1 capsule (72 mcg total) by mouth daily before breakfast.  . memantine (NAMENDA) 10 MG tablet Take 1/2 tablet twice a day  . predniSONE (DELTASONE) 10 MG tablet Prednisone 10 mg take  4  each am x 2 days,   2 each am x 2 days,  1 each am x 2 days and stop (Patient taking differently: Take 10 mg by mouth daily with breakfast. Prednisone 10 mg take  4 each am x 2 days,   2 each am x 2 days,  1 each am x 2  days and stop.)  . triamcinolone cream (KENALOG) 0.1 % Apply 1 application topically 2 (two) times daily as needed (itching).                                    Objective:   Physical Exam  07/05/2020      144 03/30/2020      141 07/08/2019      155  05/26/2019        159  03/11/2019    152 12/09/2018      154   09/12/2018     158 03/15/2018    165  02/01/2018    161  01/04/2018      166  10/18/2017      166 07/17/2017       173   06/04/2017      176  05/21/2017       175  05/04/2017       173   03/17/2016    182   02/03/14 177 lb (80.287 kg)  01/27/14 177 lb 12 oz (80.627 kg)  01/06/14 173 lb 8 oz (78.699 kg)     Vital signs reviewed  07/05/2020  - Note at rest 02 sats  96% on RA   General appearance:    Stoic amb elderly bm nad      Report :Full dentures    HEENT : pt wearing mask not removed for exam due to covid - 19 concerns.    NECK :  without JVD/Nodes/TM/ nl carotid upstrokes bilaterally   LUNGS: no acc muscle use,  Mild barrel  contour chest wall with bilateral  Distant bs s audible wheeze and  without cough on insp or exp maneuvers  and mild  Hyperresonant  to  percussion bilaterally     CV:  RRR  no s3 or murmur or increase in P2, and no edema   ABD:  soft and nontender with pos end  insp Hoover's  in the supine position. No bruits or organomegaly appreciated, bowel sounds nl  MS:   Nl gait/  ext warm without deformities, calf tenderness, cyanosis or clubbing No obvious joint restrictions   SKIN: warm and dry without lesions    NEURO:  alert, approp, nl sensorium with  no motor or cerebellar deficits apparent.             Assessment & Plan:

## 2020-07-05 NOTE — Patient Instructions (Signed)
Call Dr Thurmon Fair office for follow up for the cough [phone]  No change in pulmonary medications   Please schedule a follow up visit in 6 months but call sooner if needed

## 2020-07-05 NOTE — Assessment & Plan Note (Signed)
Quit smoking 2002  Spirometry 09/27/12  FEV1  1.06 (44%) ratio 47  - Spirometry 05/04/2017  FEV1 0.32 (16%)  Ratio 81 but f/v not physiologic   - 05/04/2017    > try symb 80 2bid as cough is the primary concern   - PFT's  07/17/2017  FEV1 1.17 (64 % ) ratio 64  p 20 % improvement from saba p nothing prior to study with DLCO  54 % corrects to 72  % for alv volume   - 09/12/2018 added pred as plan D - 05/26/2019  After extensive coaching inhaler device,  effectiveness =  75% > try breztri 2bid and start back on gerd rx  - 03/30/2020 back on symbicort 80 2bid with good control  -  03/30/2020   Walked RA  3 laps @ approx 255ft each @ moderately fast pace  stopped due to end of study, no sob, sats at end 97%       AB driven by sinus dz > lungs clear today but needs f/u with ent   -see uacs   Re saba: I spent extra time with pt today reviewing appropriate use of albuterol for prn use on exertion with the following points: 1) saba is for relief of sob that does not improve by walking a slower pace or resting but rather if the pt does not improve after trying this first. 2) If the pt is convinced, as many are, that saba helps recover from activity faster then it's easy to tell if this is the case by re-challenging : ie stop, take the inhaler, then p 5 minutes try the exact same activity (intensity of workload) that just caused the symptoms and see if they are substantially diminished or not after saba 3) if there is an activity that reproducibly causes the symptoms, try the saba 15 min before the activity on alternate days   If in fact the saba really does help, then fine to continue to use it prn but advised may need to look closer at the maintenance regimen being used to achieve better control of airways disease with exertion.

## 2020-07-05 NOTE — Assessment & Plan Note (Signed)
Allergy profile 05/21/2017 >  Eos 0.2 /  IgE  15 RAST neg -  Sinus CT 05/29/2017 1. Bilateral maxillary sinusitis with fluid levels. 2. Chronic left sphenoid sinusitis with mild mucosal thickening > f/u [facility]: ov 07/09/17 c/w persistent sinusitis > rec FESS> pt canceled  - repeat Sinus CT 02/14/2018 > Bilateral maxillary sinus disease seen on the most recent CT scan has markedly improved. There is mild residual mucosal thickening in the inferior maxillary sinuses bilaterally. Mild mucosal thickening in the sphenoid sinuses is greater on the left and has increased since the prior study. -  07/05/2020 rhinitis/ cough responsive to pred > refer to [facility] who prev rec FESS    Discussed in detail all the  indications, usual  risks and alternatives  relative to the benefits with patient who agrees to proceed with w/u as outlined.     No change in rx in meantime         Each maintenance medication was reviewed in detail including emphasizing most importantly the difference between maintenance and prns and under what circumstances the prns are to be triggered using an action plan format where appropriate.  Total time for H and P, chart review, counseling, reviewing hfa device(s) and generating customized AVS unique to this office visit / same day charting = 25 min

## 2020-09-06 ENCOUNTER — Encounter: Payer: Self-pay | Admitting: Emergency Medicine

## 2020-09-06 ENCOUNTER — Ambulatory Visit
Admission: EM | Admit: 2020-09-06 | Discharge: 2020-09-06 | Disposition: A | Discharge status: Home / Self Care | Payer: PPO | Attending: Internal Medicine | Admitting: Internal Medicine

## 2020-09-06 ENCOUNTER — Other Ambulatory Visit: Payer: Self-pay

## 2020-09-06 DIAGNOSIS — H6983 Other specified disorders of Eustachian tube, bilateral: Secondary | ICD-10-CM

## 2020-09-06 DIAGNOSIS — H8110 Benign paroxysmal vertigo, unspecified ear: Secondary | ICD-10-CM

## 2020-09-06 MED ORDER — MECLIZINE HCL 25 MG PO TABS: 25.0000 mg | ORAL_TABLET | Freq: Three times a day (TID) | ORAL | 0 refills | Status: DC | PRN

## 2020-09-06 MED ORDER — FLUTICASONE PROPIONATE 50 MCG/ACT NA SUSP
1.0000 | Freq: Two times a day (BID) | NASAL | 0 refills | Status: DC
Start: 2020-09-06 — End: 2022-01-23
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
## 2020-09-06 NOTE — ED Triage Notes (Signed)
Pt is present today with bilateral ear pain and dizziness. Pt states that his sx started four days ago

## 2020-09-22 ENCOUNTER — Telehealth: Payer: Self-pay | Admitting: Gastroenterology

## 2020-09-22 NOTE — Telephone Encounter (Signed)
He needs an appt he has not been seen on a year.  Can you schedule with Shanda Bumps or Dr Myrtie Neither?

## 2020-09-22 NOTE — Telephone Encounter (Signed)
Please send this to [facility] nurse.

## 2020-09-22 NOTE — Telephone Encounter (Signed)
Patient is scheduled for 10/20/2020 at 2:00pm. Pt  wants to know how to treat sxs until appt..  Thanks

## 2020-10-20 ENCOUNTER — Ambulatory Visit: Payer: PPO | Admitting: Gastroenterology

## 2020-10-21 ENCOUNTER — Other Ambulatory Visit: Payer: Self-pay

## 2020-10-21 ENCOUNTER — Ambulatory Visit
Admission: EM | Admit: 2020-10-21 | Discharge: 2020-10-21 | Disposition: A | Discharge status: Home / Self Care | Payer: PPO | Attending: Emergency Medicine | Admitting: Emergency Medicine

## 2020-10-21 DIAGNOSIS — H8112 Benign paroxysmal vertigo, left ear: Secondary | ICD-10-CM | POA: Diagnosis not present

## 2020-10-21 MED ORDER — MECLIZINE HCL 12.5 MG PO TABS: 12.5000 mg | ORAL_TABLET | Freq: Three times a day (TID) | ORAL | 0 refills | Status: DC | PRN

## 2020-10-21 MED ORDER — LORATADINE 10 MG PO TABS: 10.0000 mg | ORAL_TABLET | Freq: Every day | ORAL | 0 refills | Status: DC
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
## 2020-10-21 NOTE — Discharge Instructions (Addendum)
Restart Flonase nasal spray, begin daily loratadine/Claritin Meclizine as needed for dizziness If developing any headaches, vision changes, difficulty speaking, facial drooping, one-sided weakness, one-sided numbness tingling, patient or from his baseline please go to emergency room

## 2020-10-21 NOTE — ED Triage Notes (Signed)
Three weeks of bilateral ear pain and one week of intermittent dizziness.  Denies nausea and vomiting. No falls or injuries with dizzy spells. Pt was tx for similar sxs last month with relief. No uri sxs.

## 2020-10-25 NOTE — Progress Notes (Addendum)
Assessment/Plan:    Moderate Dementia without behavioral disturbance 87 yo man with a history of moderate dementia, likely vascular, currently on Donepezil 5 mg daily and Memantine 10 mg daily, with some progression of the disease. MMSE today is 12/28. -SLUMS score 6/30 in May 2021.   Recommendations  Discussed safety both in and out of the home.   Discussed the importance of regular daily schedule .  Continue to monitor mood. Stay active at least 30 minutes at least 3 times a week. Information about [facility] was given  Naps should be scheduled and should be no longer than 60 minutes and should not occur after 2 PM.  Continue donepezil 5 mg daily Side effects  were discussed  Increase Memantine to 10 mg twice daily. Side effects discussed Follow up in 6 months.   Case discussed with Dr. Karel Jarvis who agrees with the plan       Subjective:   Alexander [name] is a 87 y.o. male with vascular risk factors including hypertension, hyperlipidemia, moderate dementia, likely vascular.  He is returning to the office in a follow-up visit. Last evaluation was on 03/17/2020.  Since his last visit, the patient was hospitalized for COVID on December 2021, and he had 3 visits to the ED and urgent care in February, May and June for evaluation of  BPV, "felt to be due to buildup in the ears "- appointment with ENT within 2 weeks.  He was placed on Antivert, and he is doing well with the medication.  His wife reports that his memory is worse, repeating questions "over ", not remembering that he asked that question before.  She reports that "he is not grasping what she is trying to say ", which frustrates both.  She denies that the patient repeats stories.  His mood is stable, and denies depression.  His main complaint is being more irritable especially when he cannot find an object because he cannot remember where he left it.  He sleeps well, and denies vivid dreams or sleepwalking.   There is no paranoia or hallucinations.  He continues to be independent of bathing and dressing.  His wife now is in charge of his medications, finances and cooking.  Since last visit, he no longer drives.  He ambulates without difficulty, without a walker or a cane.  Denies headaches, trauma, falls or injuries to the head, double vision, dizziness, focal numbness or tingling. He has BPV controlled with Antivert as mentioned above. Denies unilateral weakness or tremors. Denies urine incontinence or retention. Denies constipation or diarrhea. He is taking Donepezil 5mg  daily and Memantine 10mg  daily.    HPI: This is a pleasant 87 yo RH man with a history of hypertension, hyperlipidemia,with worsening memory loss. When asked about his memory, he states "there ain't none." He noticed memory changes in his late 36s, he would forget names, however recently he would be talking about something and forget what he was talking about. He misplaces things frequently. He got lost driving one time. He was picking his wife up one time and did not know where he was (15 years ago). He occasionally forgets to take his medications and has to be told repeatedly to take it. He has noticed word-finding difficulties. No problems performing ADLs independently. He lives with his wife.  His daughter has noticed changes more in the past year or so. She would tell him something then the next minute he forgets. He asks the same questions repeatedly. At  the end of the visit, his daughter walked out to speak with me personally, and stated that her father underreported symptoms, and that she and her mother have been concerned about his driving. She did not want to mention this in front of him because he would get upset. She denies any paranoia. He states he sees things moving around especially at night, like shadows, sometimes he thinks something is crawling around on the fence. He has constipation.  His father had memory loss.   I personally  reviewed MRI brain done in 01/2014 which showed prominent mineral deposition in the globus pallidus, midbrain, and dentate nucleus. This may be normal but has been described in Parkinson's like syndromes. Moderate chronic microvascular change.  MRI brain showed moderate chronic microvascular disease    CT head on 06/05/20 IMPRESSION: 1. No acute intracranial abnormality.2. Chronic paranasal sinus disease.  PREVIOUS MEDICATIONS   CURRENT MEDICATIONS:  Outpatient Encounter Medications as of 10/26/2020  Medication Sig   albuterol (PROAIR HFA) 108 (90 Base) MCG/ACT inhaler 2 puffs every 4 hours as needed only  if your can't catch your breath (Patient taking differently: Inhale 2 puffs into the lungs every 4 (four) hours as needed for wheezing or shortness of breath. 2 puffs every 4 hours as needed only  if your can't catch your breath)   albuterol (PROVENTIL) (2.5 MG/3ML) 0.083% nebulizer solution TAKE 3 MLS BY NEBULIZATION EVERY 6 HOURS AS NEEDED FOR WHEEZING OR SHORTNESS OF BREATH. (Patient taking differently: Take 2.5 mg by nebulization every 6 (six) hours as needed for wheezing or shortness of breath.)   budesonide-formoterol (SYMBICORT) 80-4.5 MCG/ACT inhaler Inhale 2 puffs into the lungs 2 (two) times daily.   donepezil (ARICEPT) 5 MG tablet Take 5 mg by mouth at bedtime.   fluticasone (FLONASE) 50 MCG/ACT nasal spray Place 1 spray into both nostrils 2 (two) times daily.   lactulose (CONSTULOSE) 10 GM/15ML solution Take 15 mLs (10 g total) by mouth daily as needed for mild constipation.   linaclotide (LINZESS) 72 MCG capsule Take 1 capsule (72 mcg total) by mouth daily before breakfast.   meclizine (ANTIVERT) 12.5 MG tablet Take 1-2 tablets (12.5-25 mg total) by mouth 3 (three) times daily as needed for dizziness.   triamcinolone cream (KENALOG) 0.1 % Apply 1 application topically 2 (two) times daily as needed (itching).   [DISCONTINUED] memantine (NAMENDA) 10 MG tablet Take 1/2 tablet twice a  day   memantine (NAMENDA) 10 MG tablet Take 1 tablet (10 mg) twice a day   [DISCONTINUED] loratadine (CLARITIN) 10 MG tablet Take 1 tablet (10 mg total) by mouth daily.   No facility-administered encounter medications on file as of 10/26/2020.     Objective:     PHYSICAL EXAMINATION:    VITALS:   Vitals:   10/26/20 0845  BP: (!) 146/77  Pulse: 90  SpO2: 96%  Weight: 144 lb 6.4 oz (65.5 kg)  Height: 5\' 5"  (1.651 m)    GEN:  The patient appears stated age and is in NAD. HEENT:  Normocephalic, atraumatic.   Neurological examination:  General: NAD, well-groomed, appears stated age. Orientation: The patient is alert. Oriented to person, not to place or date.  Cranial nerves: There is good facial symmetry.The speech is less fluent, but clear. No aphasia or dysarthria. Fund of knowledge is reduced. Recent and remote memory are impaired. Attention and concentration are reduced.  Able to name objects and repeat phrases.  Hearing is intact to conversational tone.  He states that  he cannot read or write  Sensation: Sensation is intact to light touch throughout Motor: Strength is at least antigravity x4.     Montreal Cognitive Assessment  09/12/2018  Visuospatial/ Executive (0/5) 0  Naming (0/3) 0  Attention: Read list of digits (0/2) 1  Attention: Read list of letters (0/1) 1  Attention: Serial 7 subtraction starting at 100 (0/3) 0  Language: Repeat phrase (0/2) 2  Language : Fluency (0/1) 0  Abstraction (0/2) 0  Delayed Recall (0/5) 1  Orientation (0/6) 4  Total 9  Adjusted Score (based on education) 10     MMSE - Mini Mental State Exam 10/26/2020 05/30/2018 09/07/2017  Not completed: (No Data) - (No Data)  Orientation to time 0 0 -  Orientation to Place 0 3 -  Registration 3 3 -  Attention/ Calculation 0 3 -  Recall 3 0 -  Language- name 2 objects 2 2 -  Language- repeat 0 0 -  Language- follow 3 step command 3 3 -  Language- read & follow direction 0 0 -  Language-read &  follow direction-comments unable to read - -  Write a sentence 0 0 -  Write a sentence-comments unable to write - -  Copy design 1 0 -  Total score 12 14 -      Movement examination: Tone: There is normal tone in the UE/LE Abnormal movements:  no tremor.  No myoclonus.  No asterixis.   Coordination:  There is no decremation with RAM's. Normal finger to nose  Gait and Station: The patient has no difficulty arising out of a deep-seated chair without the use of the hands. The patient's stride length is good.  Gait is cautious and narrow.    CBC CBC Latest Ref Rng & Units 06/05/2020 04/13/2020 04/12/2020  WBC 4.0 - 10.5 K/uL 4.7 5.4 5.2  Hemoglobin 13.0 - 17.0 g/dL 41.7 40.8 12.0(L)  Hematocrit 39.0 - 52.0 % 43.7 39.5 37.9(L)  Platelets 150 - 400 K/uL 272 254 203     CMP Latest Ref Rng & Units 06/05/2020 04/13/2020 04/12/2020  Glucose 70 - 99 mg/dL 87 144(Y) 86  BUN 8 - 23 mg/dL 12 15 13   Creatinine 0.61 - 1.24 mg/dL 1.85 6.31  Sodium 135 - 145 mmol/L 141 141 138  Potassium 3.5 - 5.1 mmol/L 4.1 4.1 3.8  Chloride 98 - 111 mmol/L 103 103 102  CO2 22 - 32 mmol/L 26 28 27   Calcium 8.9 - 10.3 mg/dL 9.8 9.4 4.97)  Total Protein 6.5 - 8.1 g/dL - 6.4(L) 5.4(L)  Total Bilirubin 0.3 - 1.2 mg/dL - 0.6 0.6  Alkaline Phos 38 - 126 U/L - 55 49  AST 15 - 41 U/L - 27 21  ALT 0 - 44 U/L - 16 14       Total time spent on today's visit was 60 minutes, including both face-to-face time and nonface-to-face time. Time included that spent on review of records (prior notes available to me/labs/imaging if pertinent), discussing treatment and goals, answering patient's questions and coordinating care.  Cc:  , MD 0.2(O, PA-C

## 2020-10-26 ENCOUNTER — Other Ambulatory Visit: Payer: Self-pay

## 2020-10-26 ENCOUNTER — Ambulatory Visit: Payer: PPO | Admitting: Physician Assistant

## 2020-10-26 ENCOUNTER — Encounter: Payer: Self-pay | Admitting: Physician Assistant

## 2020-10-26 VITALS — BP 146/77 | HR 90 | Ht 65.0 in | Wt 144.4 lb

## 2020-10-26 DIAGNOSIS — F0390 Unspecified dementia without behavioral disturbance: Secondary | ICD-10-CM | POA: Diagnosis not present

## 2020-10-26 DIAGNOSIS — F03B0 Unspecified dementia, moderate, without behavioral disturbance, psychotic disturbance, mood disturbance, and anxiety: Secondary | ICD-10-CM

## 2020-10-26 MED ORDER — MEMANTINE HCL 10 MG PO TABS: ORAL_TABLET | ORAL | 3 refills | Status: DC

## 2020-10-26 NOTE — Patient Instructions (Addendum)
It was a pleasure to see you today at our office.   Recommendations:  Meds: Follow up in 6 months Continue donepezil  5 mg daily   Increase Memantine 10 mg twice daily  Information about [facility] was given.   RECOMMENDATIONS FOR ALL PATIENTS WITH MEMORY PROBLEMS: 1. Continue to exercise (Recommend 30 minutes of walking everyday, or 3 hours every week) 2. Increase social interactions - continue going to [place] and enjoy social gatherings with friends and family 3. Eat healthy, avoid fried foods and eat more fruits and vegetables 4. Maintain adequate blood pressure, blood sugar, and blood cholesterol level. Reducing the risk of stroke and cardiovascular disease also helps promoting better memory. 5. Avoid stressful situations. Live a simple life and avoid aggravations. Organize your time and prepare for the next day in anticipation. 6. Sleep well, avoid any interruptions of sleep and avoid any distractions in the bedroom that may interfere with adequate sleep quality 7. Avoid sugar, avoid sweets as there is a strong link between excessive sugar intake, diabetes, and cognitive impairment We discussed the Mediterranean diet, which has been shown to help patients reduce the risk of progressive memory disorders and reduces cardiovascular risk. This includes eating fish, eat fruits and green leafy vegetables, nuts like almonds and hazelnuts, walnuts, and also use olive oil. Avoid fast foods and fried foods as much as possible. Avoid sweets and sugar as sugar use has been linked to worsening of memory function.  There is always a concern of gradual progression of memory problems. If this is the case, then we may need to adjust level of care according to patient needs. Support, both to the patient and caregiver, should then be put into place.       FALL PRECAUTIONS: Be cautious when walking. Scan the area for obstacles that may increase the risk of trips and falls. When getting  up in the mornings, sit up at the edge of the bed for a few minutes before getting out of bed. Consider elevating the bed at the head end to avoid drop of blood pressure when getting up. Walk always in a well-lit room (use night lights in the walls). Avoid area rugs or power cords from appliances in the middle of the walkways. Use a walker or a cane if necessary and consider physical therapy for balance exercise. Get your eyesight checked regularly.  FINANCIAL OVERSIGHT: Supervision, especially oversight when making financial decisions or transactions is also recommended.  HOME SAFETY: Consider the safety of the kitchen when operating appliances like stoves, microwave oven, and blender. Consider having supervision and share cooking responsibilities until no longer able to participate in those. Accidents with firearms and other hazards in the house should be identified and addressed as well.   ABILITY TO BE LEFT ALONE: If patient is unable to contact 911 operator, consider using LifeLine, or when the need is there, arrange for someone to stay with patients. Smoking is a fire hazard, consider supervision or cessation. Risk of wandering should be assessed by caregiver and if detected at any point, supervision and safe proof recommendations should be instituted.  MEDICATION SUPERVISION: Inability to self-administer medication needs to be constantly addressed. Implement a mechanism to ensure safe administration of the medications.

## 2020-11-25 ENCOUNTER — Ambulatory Visit: Payer: PPO | Admitting: Neurology

## 2020-11-26 ENCOUNTER — Telehealth: Payer: Self-pay | Admitting: Cardiovascular Disease

## 2020-11-26 NOTE — Telephone Encounter (Signed)
Alexander [name] is returning Lisa's call. Please advise.

## 2020-11-26 NOTE — Telephone Encounter (Signed)
Attempted to reach the patient. Was unable to leave a message 

## 2020-11-26 NOTE — Telephone Encounter (Signed)
Returned the call to the patient's wife, per the dpr. She stated that the patient has been having dizzy spells for the past week intermittently. Their air conditioner did go out over a week ago and they are trying to get it fixed today.  The patient denies chest pain, swelling and shortness of breath outside his norm. They are unable to check blood pressure at home.   She did state that the patient has not been drinking as much fluids and has been advised that he should be drinking water especially with the air being out and as hot as it is outside.   She wanted a refill on the meclizine stating that helped the last time he felt this way. She has been advised to call the prescriber's office to get a refill.  She has been advised to keep the office updated if the meclizine does not help

## 2020-11-26 NOTE — Telephone Encounter (Signed)
Pt c/o Shortness Of Breath: STAT if SOB developed within the last 24 hours or pt is noticeably SOB on the phone  1. Are you currently SOB (can you hear that pt is SOB on the phone)?  Yes for about 1 week  2. How long have you been experiencing SOB? 1 week  3. Are you SOB when sitting or when up moving around? both  4. Are you currently experiencing any other symptoms? Pt have not had any air conditioner in his home  for about 1.5 weeks but he's been SOB for about 1 week.

## 2020-11-28 ENCOUNTER — Inpatient Hospital Stay (HOSPITAL_COMMUNITY)
Admission: EM | Admit: 2020-11-28 | Discharge: 2020-11-30 | DRG: 190 | Disposition: A | Discharge status: Home / Self Care | Payer: PPO | Attending: Internal Medicine | Admitting: Internal Medicine

## 2020-11-28 ENCOUNTER — Encounter (HOSPITAL_COMMUNITY): Payer: Self-pay | Admitting: Emergency Medicine

## 2020-11-28 ENCOUNTER — Ambulatory Visit: Admission: EM | Admit: 2020-11-28 | Discharge: 2020-11-28 | Payer: PPO

## 2020-11-28 ENCOUNTER — Other Ambulatory Visit: Payer: Self-pay

## 2020-11-28 ENCOUNTER — Emergency Department (HOSPITAL_COMMUNITY): Payer: PPO

## 2020-11-28 DIAGNOSIS — Z87891 Personal history of nicotine dependence: Secondary | ICD-10-CM | POA: Diagnosis not present

## 2020-11-28 DIAGNOSIS — K5909 Other constipation: Secondary | ICD-10-CM | POA: Diagnosis not present

## 2020-11-28 DIAGNOSIS — Z79899 Other long term (current) drug therapy: Secondary | ICD-10-CM | POA: Diagnosis not present

## 2020-11-28 DIAGNOSIS — I2510 Atherosclerotic heart disease of native coronary artery without angina pectoris: Secondary | ICD-10-CM | POA: Diagnosis not present

## 2020-11-28 DIAGNOSIS — F0390 Unspecified dementia without behavioral disturbance: Secondary | ICD-10-CM

## 2020-11-28 DIAGNOSIS — J9601 Acute respiratory failure with hypoxia: Secondary | ICD-10-CM | POA: Diagnosis not present

## 2020-11-28 DIAGNOSIS — J441 Chronic obstructive pulmonary disease with (acute) exacerbation: Principal | ICD-10-CM | POA: Diagnosis present

## 2020-11-28 DIAGNOSIS — F03B18 Unspecified dementia, moderate, with other behavioral disturbance: Secondary | ICD-10-CM

## 2020-11-28 DIAGNOSIS — Z8042 Family history of malignant neoplasm of prostate: Secondary | ICD-10-CM

## 2020-11-28 DIAGNOSIS — N400 Enlarged prostate without lower urinary tract symptoms: Secondary | ICD-10-CM | POA: Diagnosis not present

## 2020-11-28 DIAGNOSIS — Z83438 Family history of other disorder of lipoprotein metabolism and other lipidemia: Secondary | ICD-10-CM | POA: Diagnosis not present

## 2020-11-28 DIAGNOSIS — Z7951 Long term (current) use of inhaled steroids: Secondary | ICD-10-CM | POA: Diagnosis not present

## 2020-11-28 DIAGNOSIS — Z8616 Personal history of COVID-19: Secondary | ICD-10-CM

## 2020-11-28 DIAGNOSIS — R0602 Shortness of breath: Secondary | ICD-10-CM

## 2020-11-28 DIAGNOSIS — Z823 Family history of stroke: Secondary | ICD-10-CM

## 2020-11-28 DIAGNOSIS — G309 Alzheimer's disease, unspecified: Secondary | ICD-10-CM | POA: Diagnosis not present

## 2020-11-28 DIAGNOSIS — Z800 Family history of malignant neoplasm of digestive organs: Secondary | ICD-10-CM

## 2020-11-28 DIAGNOSIS — F0280 Dementia in other diseases classified elsewhere without behavioral disturbance: Secondary | ICD-10-CM | POA: Diagnosis present

## 2020-11-28 DIAGNOSIS — R079 Chest pain, unspecified: Secondary | ICD-10-CM | POA: Diagnosis not present

## 2020-11-28 DIAGNOSIS — I10 Essential (primary) hypertension: Secondary | ICD-10-CM | POA: Diagnosis present

## 2020-11-28 DIAGNOSIS — Z8249 Family history of ischemic heart disease and other diseases of the circulatory system: Secondary | ICD-10-CM

## 2020-11-28 DIAGNOSIS — J449 Chronic obstructive pulmonary disease, unspecified: Secondary | ICD-10-CM | POA: Diagnosis not present

## 2020-11-28 LAB — CBC WITH DIFFERENTIAL/PLATELET
Abs Immature Granulocytes: 0.01 10*3/uL (ref 0.00–0.07)
Basophils Absolute: 0.1 10*3/uL (ref 0.0–0.1)
Basophils Relative: 1 %
Eosinophils Absolute: 0.5 10*3/uL (ref 0.0–0.5)
Eosinophils Relative: 11 %
HCT: 38.1 % — ABNORMAL LOW (ref 39.0–52.0)
Hemoglobin: 12.1 g/dL — ABNORMAL LOW (ref 13.0–17.0)
Immature Granulocytes: 0 %
Lymphocytes Relative: 23 %
Lymphs Abs: 1.1 10*3/uL (ref 0.7–4.0)
MCH: 30.9 pg (ref 26.0–34.0)
MCHC: 31.8 g/dL (ref 30.0–36.0)
MCV: 97.4 fL (ref 80.0–100.0)
Monocytes Absolute: 0.5 10*3/uL (ref 0.1–1.0)
Monocytes Relative: 11 %
Neutro Abs: 2.6 10*3/uL (ref 1.7–7.7)
Neutrophils Relative %: 54 %
Platelets: 251 10*3/uL (ref 150–400)
RBC: 3.91 MIL/uL — ABNORMAL LOW (ref 4.22–5.81)
RDW: 12.6 % (ref 11.5–15.5)
WBC: 4.8 10*3/uL (ref 4.0–10.5)
nRBC: 0 % (ref 0.0–0.2)

## 2020-11-28 LAB — TROPONIN I (HIGH SENSITIVITY)
Troponin I (High Sensitivity): 17 ng/L (ref <18)
Troponin I (High Sensitivity): 15 ng/L (ref <18)

## 2020-11-28 LAB — BASIC METABOLIC PANEL
Anion gap: 7 (ref 5–15)
BUN: 11 mg/dL (ref 8–23)
CO2: 28 mmol/L (ref 22–32)
Calcium: 9.5 mg/dL (ref 8.9–10.3)
Chloride: 107 mmol/L (ref 98–111)
Creatinine, Ser: 0.84 mg/dL (ref 0.61–1.24)
GFR, Estimated: >60 mL/min (ref >60)
Glucose, Bld: 101 mg/dL — ABNORMAL HIGH (ref 70–99)
Potassium: 4.1 mmol/L (ref 3.5–5.1)
Sodium: 142 mmol/L (ref 135–145)

## 2020-11-28 MED ORDER — POTASSIUM CHLORIDE CRYS ER 20 MEQ PO TBCR
40.0000 meq | EXTENDED_RELEASE_TABLET | Freq: Once | ORAL | Status: AC
  Administered 2020-11-28: 40 meq via ORAL
  Filled 2020-11-28: qty 2

## 2020-11-28 MED ORDER — METHYLPREDNISOLONE SODIUM SUCC 125 MG IJ SOLR
125.0000 mg | Freq: Once | INTRAMUSCULAR | Status: AC
  Administered 2020-11-28: 125 mg via INTRAVENOUS
  Filled 2020-11-28: qty 2

## 2020-11-28 MED ORDER — IPRATROPIUM-ALBUTEROL 0.5-2.5 (3) MG/3ML IN SOLN
3.0000 mL | Freq: Once | RESPIRATORY_TRACT | Status: AC
  Administered 2020-11-28: 3 mL via RESPIRATORY_TRACT
  Filled 2020-11-28: qty 3
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
## 2020-11-28 NOTE — ED Notes (Signed)
Patient is being discharged from the Urgent Care and sent to the Emergency Department via POV . Per [place], [place] patient is in need of higher level of care due to COPD exacerbation. Patient is aware and verbalizes understanding of plan of care.  Vitals:   11/28/20 1601 11/28/20 1604  BP:  (S) (!) 156/90  Pulse:  86  Resp:  (S) (!) 28  Temp:  98.3 F (36.8 C)  SpO2: 91% 91%

## 2020-11-28 NOTE — ED Triage Notes (Signed)
Patient presents to Urgent Care with complaints of SOB and intermittent dizziness since yesterday. He states he was seen at the [facility] 06/30 and prescribed meds for dizziness. Wife states he has a hx of asthma/COPD and used his inhaler 2x today with no relief. Wife states no ac running in their home and so believes this may an asthma flare-up related to the heat.   Denies fever, chest pain.

## 2020-11-28 NOTE — ED Provider Notes (Signed)
Emergency Medicine Provider Triage Evaluation Note  Alexander [name] , a 87 y.o. male  was evaluated in triage.  Pt complains of shortness of breath that started this morning. History of COPD. He has been using his breathing treatments with no relief. Denies wheeze. No fever or chills. Denies cough. No associated chest pain. No history of CHF. Denies lower extremity edema. Of note, patient has a history of dementia, so difficult to obtain HPI.  Review of Systems  Positive: SOB Negative: CP  Physical Exam  There were no vitals taken for this visit. Gen:   Awake, no distress   Resp:  Normal effort  MSK:   Moves extremities without difficulty  Other:    Medical Decision Making  Medically screening exam initiated at 5:39 PM.  Appropriate orders placed.  Alexander [name] was informed that the remainder of the evaluation will be completed by another provider, this initial triage assessment does not replace that evaluation, and the importance of remaining in the ED until their evaluation is complete.  CXR Labs EKG   Alexander [name] 11/28/20 1740    Alexander Avena, MD 11/28/20 [phone]

## 2020-11-28 NOTE — ED Triage Notes (Signed)
Pt c/o shortness of breath that started yesterday. Denies chest pain, fever, new swelling or weight gain. Hx COPD, reports using inhaler with no relief.

## 2020-11-29 DIAGNOSIS — N400 Enlarged prostate without lower urinary tract symptoms: Secondary | ICD-10-CM | POA: Diagnosis present

## 2020-11-29 DIAGNOSIS — I2510 Atherosclerotic heart disease of native coronary artery without angina pectoris: Secondary | ICD-10-CM

## 2020-11-29 DIAGNOSIS — Z7951 Long term (current) use of inhaled steroids: Secondary | ICD-10-CM | POA: Diagnosis not present

## 2020-11-29 DIAGNOSIS — K5909 Other constipation: Secondary | ICD-10-CM | POA: Diagnosis present

## 2020-11-29 DIAGNOSIS — Z87891 Personal history of nicotine dependence: Secondary | ICD-10-CM | POA: Diagnosis not present

## 2020-11-29 DIAGNOSIS — I10 Essential (primary) hypertension: Secondary | ICD-10-CM | POA: Diagnosis present

## 2020-11-29 DIAGNOSIS — F0390 Unspecified dementia without behavioral disturbance: Secondary | ICD-10-CM

## 2020-11-29 DIAGNOSIS — Z83438 Family history of other disorder of lipoprotein metabolism and other lipidemia: Secondary | ICD-10-CM | POA: Diagnosis not present

## 2020-11-29 DIAGNOSIS — Z79899 Other long term (current) drug therapy: Secondary | ICD-10-CM | POA: Diagnosis not present

## 2020-11-29 DIAGNOSIS — J9601 Acute respiratory failure with hypoxia: Secondary | ICD-10-CM

## 2020-11-29 DIAGNOSIS — Z8616 Personal history of COVID-19: Secondary | ICD-10-CM | POA: Diagnosis not present

## 2020-11-29 DIAGNOSIS — G309 Alzheimer's disease, unspecified: Secondary | ICD-10-CM | POA: Diagnosis present

## 2020-11-29 DIAGNOSIS — Z8249 Family history of ischemic heart disease and other diseases of the circulatory system: Secondary | ICD-10-CM | POA: Diagnosis not present

## 2020-11-29 DIAGNOSIS — J441 Chronic obstructive pulmonary disease with (acute) exacerbation: Secondary | ICD-10-CM | POA: Diagnosis present

## 2020-11-29 DIAGNOSIS — Z823 Family history of stroke: Secondary | ICD-10-CM | POA: Diagnosis not present

## 2020-11-29 DIAGNOSIS — Z800 Family history of malignant neoplasm of digestive organs: Secondary | ICD-10-CM | POA: Diagnosis not present

## 2020-11-29 DIAGNOSIS — F0280 Dementia in other diseases classified elsewhere without behavioral disturbance: Secondary | ICD-10-CM | POA: Diagnosis present

## 2020-11-29 DIAGNOSIS — Z8042 Family history of malignant neoplasm of prostate: Secondary | ICD-10-CM | POA: Diagnosis not present

## 2020-11-29 DIAGNOSIS — F03B18 Unspecified dementia, moderate, with other behavioral disturbance: Secondary | ICD-10-CM

## 2020-11-29 DIAGNOSIS — R0602 Shortness of breath: Secondary | ICD-10-CM | POA: Diagnosis present

## 2020-11-29 LAB — RESP PANEL BY RT-PCR (FLU A&B, COVID) ARPGX2
Influenza A by PCR: NEGATIVE
Influenza B by PCR: NEGATIVE
SARS Coronavirus 2 by RT PCR: NEGATIVE

## 2020-11-29 MED ORDER — ACETAMINOPHEN 325 MG PO TABS
650.0000 mg | ORAL_TABLET | Freq: Four times a day (QID) | ORAL | Status: DC | PRN
  Administered 2020-11-30: 650 mg via ORAL
  Filled 2020-11-29: qty 2

## 2020-11-29 MED ORDER — PREDNISONE 20 MG PO TABS
40.0000 mg | ORAL_TABLET | Freq: Every day | ORAL | Status: DC
  Administered 2020-11-29: 40 mg via ORAL
  Filled 2020-11-29: qty 2

## 2020-11-29 MED ORDER — ENOXAPARIN SODIUM 40 MG/0.4ML IJ SOSY
40.0000 mg | PREFILLED_SYRINGE | INTRAMUSCULAR | Status: DC
  Administered 2020-11-29 – 2020-11-30 (×2): 40 mg via SUBCUTANEOUS
  Filled 2020-11-29 (×2): qty 0.4

## 2020-11-29 MED ORDER — IPRATROPIUM-ALBUTEROL 0.5-2.5 (3) MG/3ML IN SOLN
3.0000 mL | Freq: Once | RESPIRATORY_TRACT | Status: DC
  Filled 2020-11-29: qty 3

## 2020-11-29 MED ORDER — MEMANTINE HCL 10 MG PO TABS
10.0000 mg | ORAL_TABLET | Freq: Two times a day (BID) | ORAL | Status: DC
  Administered 2020-11-29 (×2): 10 mg via ORAL
  Filled 2020-11-29 (×2): qty 1

## 2020-11-29 MED ORDER — DM-GUAIFENESIN ER 30-600 MG PO TB12
1.0000 | ORAL_TABLET | Freq: Two times a day (BID) | ORAL | Status: DC | PRN
  Filled 2020-11-29: qty 1

## 2020-11-29 MED ORDER — DOXYCYCLINE HYCLATE 100 MG PO TABS
100.0000 mg | ORAL_TABLET | Freq: Two times a day (BID) | ORAL | Status: DC
  Administered 2020-11-29 (×2): 100 mg via ORAL
  Filled 2020-11-29 (×2): qty 1

## 2020-11-29 MED ORDER — IPRATROPIUM-ALBUTEROL 0.5-2.5 (3) MG/3ML IN SOLN
3.0000 mL | Freq: Four times a day (QID) | RESPIRATORY_TRACT | Status: DC
  Administered 2020-11-29 – 2020-11-30 (×5): 3 mL via RESPIRATORY_TRACT
  Filled 2020-11-29 (×5): qty 3

## 2020-11-29 MED ORDER — FLUTICASONE PROPIONATE 50 MCG/ACT NA SUSP
1.0000 | Freq: Two times a day (BID) | NASAL | Status: DC
  Administered 2020-11-29: 1 via NASAL
  Filled 2020-11-29: qty 16

## 2020-11-29 MED ORDER — SENNOSIDES-DOCUSATE SODIUM 8.6-50 MG PO TABS: 1.0000 | ORAL_TABLET | Freq: Every evening | ORAL | Status: DC | PRN

## 2020-11-29 MED ORDER — ALBUTEROL SULFATE (2.5 MG/3ML) 0.083% IN NEBU: 2.5000 mg | INHALATION_SOLUTION | RESPIRATORY_TRACT | Status: DC | PRN

## 2020-11-29 MED ORDER — BUDESONIDE 0.25 MG/2ML IN SUSP
0.2500 mg | Freq: Two times a day (BID) | RESPIRATORY_TRACT | Status: DC
  Administered 2020-11-29 – 2020-11-30 (×3): 0.25 mg via RESPIRATORY_TRACT
  Filled 2020-11-29 (×4): qty 2

## 2020-11-29 MED ORDER — DONEPEZIL HCL 10 MG PO TABS
5.0000 mg | ORAL_TABLET | Freq: Every day | ORAL | Status: DC
  Administered 2020-11-29: 5 mg via ORAL
  Filled 2020-11-29: qty 1

## 2020-11-29 MED ORDER — LINACLOTIDE 72 MCG PO CAPS
72.0000 ug | ORAL_CAPSULE | Freq: Every day | ORAL | Status: DC
  Administered 2020-11-29: 72 ug via ORAL
  Filled 2020-11-29 (×2): qty 1

## 2020-11-29 MED ORDER — ACETAMINOPHEN 650 MG RE SUPP: 650.0000 mg | Freq: Four times a day (QID) | RECTAL | Status: DC | PRN

## 2020-11-29 NOTE — ED Notes (Signed)
Pt is confused, asking this RN repetitively about why he is here. Pt is calm & redirectable. He has removed his IV & dressed himself in all of his personal clothing once the family member he had at bedside left. Pt has been appeased with a snack to wait for his bed to be admitted upstairs.

## 2020-11-29 NOTE — ED Notes (Signed)
Pt ambulated in room with EMT, sats while sitting 93%. Sats dropped to 86% after getting to the side of bed and standing to take a few steps.

## 2020-11-29 NOTE — ED Notes (Signed)
Pt was seen walking down the hallway again straying from his room. Still waiting for his son to come sit with pt.

## 2020-11-29 NOTE — ED Notes (Signed)
Wife at bedside unaware of pending admission. Reports they have been out of air conditioning for the past week and are supposed to have it replaced this morning. Wife afraid to leave bedside due to patient's dementia and concerns for increased confusion if she isn't at bedside.  Pt currently asleep, sats at 94% on room air.

## 2020-11-29 NOTE — ED Notes (Signed)
Advised floor nurse that wife should be allowed to stay overnight due to patient's dementia. Patient is more calm and cooperative with wife at bedside.

## 2020-11-29 NOTE — ED Notes (Signed)
Pt still refuses staff to get v/s.

## 2020-11-29 NOTE — ED Notes (Signed)
Floor called and stated that room was dirty. Pt transport delayed.

## 2020-11-29 NOTE — ED Notes (Signed)
EDP at bedside  

## 2020-11-29 NOTE — ED Notes (Signed)
Pts son was called by this RN d/t pt being very confused & upset. Son is letting pt know that he is a pt here in the hospital & the "lady in the hallway" is not his wife. The RN will monitor once phone call has ended.

## 2020-11-29 NOTE — ED Notes (Signed)
Staff is re-attempting to get pt's v/s updated.

## 2020-11-29 NOTE — ED Notes (Signed)
PT placed on 2LPM [place] after O2 saturation dropped to 86% upon ambulation. O2 saturation back up to 96 after this

## 2020-11-29 NOTE — ED Notes (Signed)
Pt became confused and was seen sitting with another pt in the hallway & thought that pt was his wife. Pt wa finally convinced by staff to return to his room. Pt forgot he was a hospital pt, he was brought back to his room & was not cooperating with staff.

## 2020-11-29 NOTE — ED Notes (Signed)
Assisted pt to dial his wife's number on her personal cell phone.

## 2020-11-29 NOTE — ED Notes (Signed)
This RN attempted to call report to 6N, pt has "Not been approved" by charge (per [employer]omatic Services operational officer).
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
## 2020-11-29 NOTE — ED Notes (Signed)
Pt assisted in room, redirected, HOB elevated to comfort, urinal replaced.

## 2020-11-29 NOTE — Progress Notes (Signed)
87 year old with history of feels [place] dementia, COPD, CAD, HTN, HLD, BPH, anxiety, GERD, depression admitted for shortness of breath.  His home AC has not been working there for the past week started having worsening shortness of breath.  COVID-19/PCR negative, troponins remain negative.  Chest x-ray was negative.  Diagnosed with COPD exacerbation and admitted to the hospital.   Seen and examined at bedside, feels better but still dyspneic with minimal exertion and moving around on his bed  Constitutional: Not in acute distress, 2 L nasal cannula in the ED Respiratory: Diffuse bilateral diminished breath sounds Cardiovascular: Normal sinus rhythm, no rubs Abdomen: Nontender nondistended good bowel sounds Musculoskeletal: No edema noted Skin: No rashes seen Neurologic: CN 2-12 grossly intact.  And nonfocal Psychiatric: Normal judgment and insight. Alert and oriented x 3. Normal mood.     Acute COPD exacerbation-on room air saturating greater than 90% with ambulation is desaturates down to 86%.  Continue steroids, bronchodilators, I-S, flutter.  Empiric doxycycline  History of Alzheimer's dementia-continue home medicine Aricept and Namenda  CAD-troponins remain flat.  Stable  HTN-stable  Chronic constipation-Linzess  Spoke with patient's RN in ED   Stephania Fragmin MD

## 2020-11-29 NOTE — ED Notes (Signed)
Pt refused vitals. Pt initially agreed, but then began taking off BP cuff stating "What are you doing. Why are you doing this? Why do yall want to keep taking my BP." BP cuff was then removed, along with sp O2 monitor. Pt is confused and upset that he can not "go on the porch" and must stay in his room. Pt agreed to stand in doorway, after explaining he could not walk through the halls. Pt currently standing in doorway of RM 03, is cooperative and calm.

## 2020-11-29 NOTE — ED Notes (Addendum)
Pt's son reported he is coming to sit with pt. Pt has became much calmer & more cooperative.

## 2020-11-29 NOTE — ED Notes (Signed)
Family at bedside. 

## 2020-11-29 NOTE — ED Notes (Signed)
Wife at bedside & intends to stay the night.

## 2020-11-30 ENCOUNTER — Other Ambulatory Visit (HOSPITAL_COMMUNITY): Payer: Self-pay

## 2020-11-30 LAB — CBC
HCT: 34.8 % — ABNORMAL LOW (ref 39.0–52.0)
Hemoglobin: 11.6 g/dL — ABNORMAL LOW (ref 13.0–17.0)
MCH: 31.2 pg (ref 26.0–34.0)
MCHC: 33.3 g/dL (ref 30.0–36.0)
MCV: 93.5 fL (ref 80.0–100.0)
Platelets: 235 10*3/uL (ref 150–400)
RBC: 3.72 MIL/uL — ABNORMAL LOW (ref 4.22–5.81)
RDW: 12.6 % (ref 11.5–15.5)
WBC: 7.2 10*3/uL (ref 4.0–10.5)
nRBC: 0 % (ref 0.0–0.2)

## 2020-11-30 LAB — BASIC METABOLIC PANEL
Anion gap: 6 (ref 5–15)
BUN: 19 mg/dL (ref 8–23)
CO2: 31 mmol/L (ref 22–32)
Calcium: 9.1 mg/dL (ref 8.9–10.3)
Chloride: 102 mmol/L (ref 98–111)
Creatinine, Ser: 1.01 mg/dL (ref 0.61–1.24)
GFR, Estimated: >60 mL/min (ref >60)
Glucose, Bld: 112 mg/dL — ABNORMAL HIGH (ref 70–99)
Potassium: 4.1 mmol/L (ref 3.5–5.1)
Sodium: 139 mmol/L (ref 135–145)

## 2020-11-30 LAB — MAGNESIUM: Magnesium: 2 mg/dL (ref 1.7–2.4)

## 2020-11-30 MED ORDER — DOXYCYCLINE HYCLATE 100 MG PO TABS
100.0000 mg | ORAL_TABLET | Freq: Two times a day (BID) | ORAL | 0 refills | Status: AC
  Filled 2020-11-30: qty 8, 4d supply, fill #0

## 2020-11-30 MED ORDER — METOCLOPRAMIDE HCL 5 MG/ML IJ SOLN: 10.0000 mg | Freq: Four times a day (QID) | INTRAMUSCULAR | Status: DC | PRN

## 2020-11-30 MED ORDER — PREDNISONE 20 MG PO TABS
40.0000 mg | ORAL_TABLET | Freq: Every day | ORAL | 0 refills | Status: AC
  Filled 2020-11-30: qty 8, 4d supply, fill #0

## 2020-11-30 NOTE — Plan of Care (Signed)
  Problem: Clinical Measurements: Goal: Cardiovascular complication will be avoided Outcome: Progressing   Problem: Pain Managment: Goal: General experience of comfort will improve Outcome: Progressing   Problem: Safety: Goal: Ability to remain free from injury will improve Outcome: Progressing   
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
## 2020-11-30 NOTE — Progress Notes (Signed)
Nutrition Brief Note  Patient identified on the Malnutrition Screening Tool (MST) Report.  Admitting Dx: SOB (shortness of breath) [R06.02] COPD exacerbation (HCC) [J44.1] PMH:  Past Medical History:  Diagnosis Date   Alzheimers disease (HCC)    mild   Anxiety    Asthma    BPH (benign prostatic hyperplasia)    CAD (coronary artery disease)    Constipation    COPD (chronic obstructive pulmonary disease) (HCC)    chronic dyspnea   Dementia (HCC)    wife answered he has Dementia   Depression    GERD (gastroesophageal reflux disease)    Hearing loss    Hemorrhoids    HYPERLIPIDEMIA    Hypertension    Hypertensive heart disease    LVH (left ventricular hypertrophy)    a. 2014 Echo: EF 65-70%, no rwma, Gr1 DD, mild MR.   Non-obstructive CAD (coronary artery disease)    a. 02/2009 Cath: essentially nl cors; b. 07/2014 MV: mild diaph atten, no ischemia-->low risk.   Medications:   budesonide (PULMICORT) nebulizer solution  0.25 mg Nebulization BID   donepezil  5 mg Oral QHS   doxycycline  100 mg Oral Q12H   enoxaparin (LOVENOX) injection  40 mg Subcutaneous Q24H   fluticasone  1 spray Each Nare BID   ipratropium-albuterol  3 mL Nebulization Q6H   linaclotide  72 mcg Oral QAC breakfast   memantine  10 mg Oral BID   predniSONE  40 mg Oral Q breakfast   Labs: Recent Labs  Lab 11/28/20 1739 11/30/20 0357  NA 142 139  K 4.1 4.1  CL 107 102  CO2 28 31  BUN 11 19  CREATININE 0.84 1.01  CALCIUM 9.5 9.1  MG  --  2.0  GLUCOSE 101* 112*   Wt Readings from Last 15 Encounters:  10/26/20 65.5 kg  07/05/20 65.5 kg  03/30/20 64.3 kg  03/17/20 64.9 kg  11/05/19 66.2 kg  09/10/19 67 kg  09/01/19 68.5 kg  07/09/19 70.3 kg  07/08/19 70.3 kg  05/26/19 72.1 kg  05/16/19 68 kg  03/12/19 70.3 kg  03/11/19 68.9 kg  02/19/19 69.9 kg  01/03/19 68.9 kg   There is no height or weight on file to calculate BMI.   Pt denies changes to appetite/weight and reports appetite has been  good. Current diet order is Heart Healthy and, although no PO intake has been documented, pt reports eating really well and 75-100% meal completion. Pt currently pending discharge to home. Discharge orders have been signed. Pt denies having any needs. No nutrition interventions warranted at this time. If nutrition issues arise, please consult RD.   Eugene Gavia, MS, RD, LDN (she/her/hers) RD pager number and weekend/on-call pager number located in Amion.

## 2020-12-06 ENCOUNTER — Encounter: Payer: Self-pay | Admitting: Cardiovascular Disease

## 2020-12-06 ENCOUNTER — Telehealth: Payer: Self-pay | Admitting: Cardiovascular Disease

## 2020-12-06 NOTE — Telephone Encounter (Signed)
Returned call to patient wife(Okay per DPR) who states that she was was calling in regard to patients Doxycycline prescription that he was discharged on. Patients wife states that she wasn't sure why patient was discharged on this medication and states that patient has not started it. Advised patients wife that it looks like this medication was prescribed due to patients COPD exacerbation but advised her that this medication was not ordered by cardiology and to check with patients PCP regarding this medication as she would like to know if patient should start it. Patients wife would also like to know if this is safe from a cardiology perspective to take as well. Advised that I would forward message to our PharmD department for review and advice. Patients wife verbalized understanding.
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
## 2020-12-06 NOTE — Telephone Encounter (Signed)
Error

## 2020-12-06 NOTE — Telephone Encounter (Signed)
Pt c/o medication issue:  1. Name of Medication:  Doxycycline  2. How are you currently taking this medication (dosage and times per day)?  Patient hasn't started medication  3. Are you having a reaction (difficulty breathing--STAT)?  No   4. What is your medication issue?   Antonette with [employer] states patient's wife, [name], would like to know why this medication was prescribed recently while patient was admitted in hospital. She states the patient hasn't started taking it yet and they want to advise with Dr. [name] prior to taking it. Antonette states she would prefer not to receive a call back unless we have additional questions. Please return call to the patient.

## 2020-12-07 NOTE — Telephone Encounter (Signed)
Ok to start doxycycline from cardiac safety perspective, no drug interactions noted either.

## 2020-12-07 NOTE — Telephone Encounter (Signed)
Returned call to patients wife (okay per DPR), advised patient's wife of PharmD's recommendations. Patients wife states that patient is still short of breath. Per patients wife it is not as bad as when he was previously admitted. Patients wife reports that patient does not weigh him self daily  but reports that he does not have any swelling at this time. Patients wife states that patient has taken all of his medications as prescribed. [name] reports that it is hard to get information regarding symptoms from patient as he has dementia. Patients wife states that she will continue to monitor patients symptoms and will call with any changes. Advised patients wife of ER precautions should new or worsening symptoms develop. Advised her to keep patients appointment scheduled for next week 8/25 with Marjie Skiff PA-C as well. [name] verbalized understanding.   Will forward to MD to make aware.

## 2020-12-15 DIAGNOSIS — H35372 Puckering of macula, left eye: Secondary | ICD-10-CM | POA: Diagnosis not present

## 2020-12-15 DIAGNOSIS — H35033 Hypertensive retinopathy, bilateral: Secondary | ICD-10-CM | POA: Diagnosis not present

## 2020-12-15 DIAGNOSIS — H04123 Dry eye syndrome of bilateral lacrimal glands: Secondary | ICD-10-CM | POA: Diagnosis not present

## 2020-12-15 DIAGNOSIS — H35353 Cystoid macular degeneration, bilateral: Secondary | ICD-10-CM | POA: Diagnosis not present

## 2020-12-15 DIAGNOSIS — H401231 Low-tension glaucoma, bilateral, mild stage: Secondary | ICD-10-CM | POA: Diagnosis not present

## 2020-12-16 ENCOUNTER — Ambulatory Visit: Payer: PPO | Admitting: Student

## 2020-12-16 ENCOUNTER — Other Ambulatory Visit: Payer: Self-pay

## 2020-12-16 ENCOUNTER — Encounter: Payer: Self-pay | Admitting: Student

## 2020-12-16 VITALS — BP 140/70 | HR 65 | Ht 63.0 in | Wt 141.8 lb

## 2020-12-16 DIAGNOSIS — J449 Chronic obstructive pulmonary disease, unspecified: Secondary | ICD-10-CM

## 2020-12-16 DIAGNOSIS — I10 Essential (primary) hypertension: Secondary | ICD-10-CM | POA: Diagnosis not present

## 2020-12-16 DIAGNOSIS — R42 Dizziness and giddiness: Secondary | ICD-10-CM | POA: Diagnosis not present

## 2020-12-16 DIAGNOSIS — E785 Hyperlipidemia, unspecified: Secondary | ICD-10-CM

## 2020-12-16 DIAGNOSIS — R0609 Other forms of dyspnea: Secondary | ICD-10-CM

## 2020-12-16 NOTE — Patient Instructions (Signed)
Medication Instructions:  No Changes *If you need a refill on your cardiac medications before your next appointment, please call your pharmacy*   Lab Work: No Labs If you have labs (blood work) drawn today and your tests are completely normal, you will receive your results only by: MyChart Message (if you have MyChart) OR A paper copy in the mail If you have any lab test that is abnormal or we need to change your treatment, we will call you to review the results.   Testing/Procedures: No Testing    Follow-Up: At [facility], you and your health needs are our priority.  As part of our continuing mission to provide you with exceptional heart care, we have created designated Provider Care Teams.  These Care Teams include your primary Cardiologist (physician) and Advanced Practice Providers (APPs -  Physician Assistants and Nurse Practitioners) who all work together to provide you with the care you need, when you need it.  We recommend signing up for the patient portal called "MyChart".  Sign up information is provided on this After Visit Summary.  MyChart is used to connect with patients for Virtual Visits (Telemedicine).  Patients are able to view lab/test results, encounter notes, upcoming appointments, etc.  Non-urgent messages can be sent to your provider as well.   To learn more about what you can do with MyChart, go to [email or web address].    Your next appointment:   4-6 month(s)  The format for your next appointment:   In Person  Provider:   Nicki Guadalajara, MD

## 2020-12-20 ENCOUNTER — Encounter (INDEPENDENT_AMBULATORY_CARE_PROVIDER_SITE_OTHER): Payer: PPO | Admitting: Ophthalmology

## 2020-12-20 ENCOUNTER — Other Ambulatory Visit: Payer: Self-pay

## 2020-12-20 DIAGNOSIS — H35033 Hypertensive retinopathy, bilateral: Secondary | ICD-10-CM

## 2020-12-20 DIAGNOSIS — H43813 Vitreous degeneration, bilateral: Secondary | ICD-10-CM | POA: Diagnosis not present

## 2020-12-20 DIAGNOSIS — H59033 Cystoid macular edema following cataract surgery, bilateral: Secondary | ICD-10-CM

## 2020-12-20 DIAGNOSIS — I10 Essential (primary) hypertension: Secondary | ICD-10-CM | POA: Diagnosis not present

## 2020-12-21 DIAGNOSIS — H903 Sensorineural hearing loss, bilateral: Secondary | ICD-10-CM | POA: Diagnosis not present

## 2020-12-21 DIAGNOSIS — J449 Chronic obstructive pulmonary disease, unspecified: Secondary | ICD-10-CM | POA: Diagnosis not present

## 2020-12-21 DIAGNOSIS — G309 Alzheimer's disease, unspecified: Secondary | ICD-10-CM | POA: Diagnosis not present

## 2020-12-21 DIAGNOSIS — K5909 Other constipation: Secondary | ICD-10-CM | POA: Diagnosis not present

## 2020-12-21 DIAGNOSIS — R2681 Unsteadiness on feet: Secondary | ICD-10-CM | POA: Diagnosis not present

## 2021-01-05 ENCOUNTER — Encounter: Payer: Self-pay | Admitting: Internal Medicine

## 2021-01-05 ENCOUNTER — Ambulatory Visit: Payer: PPO | Admitting: Internal Medicine

## 2021-01-05 ENCOUNTER — Other Ambulatory Visit: Payer: Self-pay

## 2021-01-05 DIAGNOSIS — J449 Chronic obstructive pulmonary disease, unspecified: Secondary | ICD-10-CM

## 2021-01-05 DIAGNOSIS — R058 Other specified cough: Secondary | ICD-10-CM | POA: Diagnosis not present

## 2021-01-05 NOTE — Patient Instructions (Signed)
Call Dr Thurmon Fair office for follow up for the cough [phone]  No change in pulmonary medications  Please schedule a follow up visit in 6 months but call sooner if needed  -  must bring all medications to avoid confusion

## 2021-01-05 NOTE — Progress Notes (Signed)
Subjective:   Patient ID: Alexander [name], male    DOB: Oct 26, 1932    MRN: [id number]     Brief patient profile:  87  yobm quit smoking 04/2000    gold III COPD     01/09/13  Cardiac evaluation - echo nml  Stress test - peak VO2 68%, limited by ventilation  c. cath [name]) -11/10 - nml LV fn, nml coronaries  Spirometry >>FEV1 1.14 - 45% -moderate airway obstruction       05/04/2017 acute extended ov/Alexander [name] re:  COPD III  Really not clear what meds he's been taking nor is his wife sure Chief Complaint  Patient presents with   Acute Visit    productive cough with clear,gray sputum,SOB w/ activity,feels it has improved with the inhaler he uses  daily cough x 3 months, worse in am but all day long and doe = MMRC3 = can't walk 100 yards even at a slow pace at a flat grade s stopping due to sob  - still shops at [employer] but can't get around without resting  Assoc rhinitis/ nasal congestion  Very confused again with meds  rec Symbicort 80  Take 2 puffs first thing in am and then another 2 puffs about 12 hours later.  Pantoprazole (protonix) 40 mg   Take  30-60 min before first meal of the day and Pepcid (famotidine)  20 mg one @  bedtime until return to office Prednisone 10 mg take  4 each am x 2 days,   2 each am x 2 days,  1 each am x 2 days and stop   Augmentin 875 mg take one pill twice daily  X 10 days - take at breakfast and supper with large glass of water.  GERD diet  Work on inhaler technique   Please schedule a follow up office visit in 2 weeks, sooner if needed with all medications / inhalers / solutions    05/21/2017  f/u ov/Alexander [name] re:  COPD GOLD III / main problem is coughing > sob  Chief Complaint  Patient presents with   Follow-up    Cough is about the same. He has not been looking at what color sputum he is producing. He still has abx left over, he has only been taking 1 tablet daily.   Cough was gone to his satisfaction until 3 days prior to OV  Then acute worse/ min  productive/ still on augmentin  Wife has same "cold"   pt brought symbicort 80 sample well past the red line/ again confused with meds Dspnea:  No change = MMRC3 = can't walk 100 yards even at a slow pace at a flat grade s stopping due to sob  / not using neb saba at all or following action plan  Sleep: disrupted by coughing fits now and has to sleep propped up  rec Plan A = Automatic = symbicort 80 Take 2 puffs first thing in am and then another 2 puffs about 12 hours later.  Work on inhaler technique:   Plan B = Backup Only use your albuterol as a rescue medicatio Best cough medicine > delsym 2 tsp every 12 hours and if still coughing supplement with Tylenol #3 Finish your antibiotics Please see patient coordinator before you leave today  to schedule sinus CT  Please remember to go to the lab department downstairs in the basement  for your tests - we will call you with the results when they are available.    Please  schedule a follow up office visit in 2 weeks,     06/04/2017  f/u ov/Alexander [name] re:  Ab/ sinusitis  Chief Complaint  Patient presents with   Follow-up    Increased cough with grey sputum since the last visit.   using saba neb several times a day  Dyspnea:  MMRC2 = can't walk a nl pace on a flat grade s sob but does fine slow and flat  Cough: esp after supper variably productive min slt dark mucus Sleep: ok flat   Main complaints are related to nasal congestion   rec No change in medications for now Keep appt to see Dr Annalee Genta > rec FESS> did not do      03/11/2019  f/u ov/Alexander [name] re:  Copd III / ab did not bring meds, has not needed Plan D  Chief Complaint  Patient presents with   Follow-up    Breathing is doing well. He states he is using his albuterol inhaler daily.   Dyspnea:  Still doing yard work Cough: none  Sleeping: ok flat, two pillows  SABA use: confused with how/ when to use "albuterol inhaler" and actually doesn't have one now per wife as was not refilled    02: none  rec Plan A = Automatic = Symbicort 80 Take 2 puffs first thing in am and then another 2 puffs about 12 hours later.  Work on Archivist:   Plan B = Backup Only use your albuterol inhaler as a rescue medication  Plan C = Crisis - only use your albuterol nebulizer if you first try Plan B and it fails to help > ok to use the nebulizer up to every 4 hours but if start needing it regularly call for immediate appointment Plan D = Deltasone (Prednisone)  - take this if need nebulizer more than rarely  Prednisone 10 mg take  4 each am x 2 days,   2 each am x 2 days,  1 each am x 2 days and stop    05/26/2019  f/u ov/Alexander [name] re: aecopd  Chief Complaint  Patient presents with   Acute Visit    Pt c/o increased DOE x 2 wks. He also c/o cough with gray sputum.  He is using his proair 1-2 x per day.   Dyspnea:  Around the house / worse off gerd rx "it ran out and no one would refill it"  Cough: minimal am gray mucus  Sleeping: two pillows  SABA use: only 2 x daily hfa even with flare, neb tubing old/ not following action plans x for pred which seems to help some Rec Plan A = Automatic = Always=    symbicort or Breztri Take 2 puffs first thing in am and then another 2 puffs about 12 hours later.  Pantoprazole (protonix) 40 mg   Take  30-60 min before first meal of the day and Pepcid (famotidine)  40 mg one after supper until return to office - this is the best way to tell whether stomach acid is contributing to your problem.   Plan B = Backup (to supplement plan A, not to replace it) Only use your albuterol inhaler as a rescue medication Plan C = Crisis (instead of Plan B but only if Plan B stops working) - only use your albuterol nebulizer if you first try Plan B and it fails to help > ok to use the nebulizer up to every 4 hours but if start needing it regularly call for immediate appointment  Plan D = Deltasone = Prednisone  - Prednisone 10 mg take  4 each am x 2 days,   2  each am x 2 days,  1 each am x 2 days and stop  Please schedule a follow up office visit in 4 weeks, sooner if needed  with all medications /inhalers/ solutions in hand so we can verify exactly what you are taking. This includes all medications from all doctors and over the counters   07/08/2019  Acute ov/Alexander [name] re:all better until  worse sob/ cough/ wheeze / second shot  06/06/19  Chief Complaint  Patient presents with   Acute Visit    Breathing worse x 4 days, wheezing, cough with green/gray sputum.    Dyspnea: increase since 07/05/19 with cough/ wheeze minimally discolored sputum   Sleeping: worse since onset of sob/ bed flat on 2 pillows  SABA use: hfa and neb but not following action plan written / reviewed t last ov  02: none  rec Plan A = Automatic = Always=    symbicort or Breztri Take 2 puffs first thing in am and then another 2 puffs about 12 hours later.  Continue pantoprazole (protonix) 40 mg   Take  30-60 min before first meal of the day and Pepcid (famotidine)  40 mg one after supper until return to office - this is the best way to tell whether stomach acid is contributing to your problem.   Plan B = Backup (to supplement plan A, not to replace it) Only use your albuterol inhaler as a rescue medication  Plan C = Crisis (instead of Plan B but only if Plan B stops working) - only use your albuterol nebulizer if you first try Plan B and it fails to help  Plan D = Deltasone = Prednisone  - Prednisone 10 mg take  4 each am x 2 days,   2 each am x 2 days,  1 each am x 2 days and stop  zpak     03/30/2020  f/u ov/Alexander [name] re: GOLD III copd  Chief Complaint  Patient presents with   Follow-up    shortness of breath with activity  Dyspnea: mb and back is fine / yard work is fine / can't really identify a specific activity that makes him sob  Cough: daytime tickle ? pnds "I feel like I have a hole in my nose"  Sleeping: fine flat  SABA use:  A couple times a day then neb couple times a  week  02: none  rec Plan A = Automatic = Always=    Symbicort 80 Take 2 puffs first thing in am and then another 2 puffs about 12 hours later.  Work on inhaler technique:   Plan B = Backup (to supplement plan A, not to replace it) Only use your albuterol inhaler as a rescue medication Plan C = Crisis (instead of Plan B but only if Plan B stops working) - only use your albuterol nebulizer if you first try Plan B and it fails to help Plan D = Deltasone  - if ABC not working to your satisfaction then Prednisone 10 mg take  4 each am x 2 days,   2 each am x 2 days,  1 each am x 2 days and stop  ENT evaluation would be the next stop but but I'll defer this to Dr Eloise Harman  Please schedule a follow up visit in 3 months but call sooner if needed  with all medications /inhalers/ solutions  in hand so we can verify exactly what you are taking. This includes all medications from all doctors and over the counters   07/05/2020  f/u ov/Alexander [name] re:  Month since last prednisone  Chief Complaint  Patient presents with   Follow-up    Cough up sputum and SOB  Dyspnea: still doing some yardwork  Cough: minimal thick white / always better p pred assoc with rhinitis Sleeping: bed is flat/ 2 pillows no resp issues or am cough/ congestion   02: none  Covid status:   vax x 2  And had covid  Rec Call Dr Thurmon Fair office for follow up for the cough [phone] No change in pulmonary medications  87 year old with history of feels Eimers dementia, COPD, CAD, HTN, HLD, BPH, anxiety, GERD, depression admitted for shortness of breath.  His home AC has not been working there for the past week started having worsening shortness of breath.  COVID-19/PCR negative, troponins remain negative.  Chest x-ray was negative.  Diagnosed with COPD exacerbation and admitted to the hospital.  During the hospitalization course with bronchodilators, steroids and doxycycline his symptoms significantly improved and he was stable for  discharge.  He was ambulating okay without shortness of breath and saturating well as well.  Wife present at bedside during my evaluation on the day of discharge.  All the questions answered.       Acute COPD exacerbation -Patient is now saturating well with ambulation on room air.  Denies any shortness of breath.  We will discharge him on oral prednisone and doxycycline along with bronchodilators.  Patient and family states they do have nebulizers at home which they can continue using.   History of Alzheimer's dementia-continue home medicine Aricept and Namenda   CAD-troponins remain flat.  Stable   HTN-stable   Chronic constipation-Linzess   There is no height or weight on file to calculate BMI.       Admit date: 11/28/2020 Discharge date: 11/30/2020     Discharge Diagnoses:  Principal Problem:   COPD exacerbation [facility]) Active Problems:   Essential hypertension   Acute hypoxemic respiratory failure (HCC)   Dementia (HCC)   CAD (coronary artery disease)  01/05/2021  f/u ov/Alexander [name] re: GOLD III COPD    maint on symbicort 80  / very poor hfa  Chief Complaint  Patient presents with   Follow-up    Pt states SOB, winded, and dizzy.   Dyspnea:  mb slt uphill to house x  50 ft  Cough: not a problem  Sleeping: flat bed/ 2 pillows  SABA use: not needing neb  02: none  Covid status:  vax x 2 and covid   No obvious day to day or daytime variability or assoc excess/ purulent sputum or mucus plugs or hemoptysis or cp or chest tightness, subjective wheeze or overt   hb symptoms.   Sleeping  without nocturnal  or early am exacerbation  of respiratory  c/o's or need for noct saba. Also denies any obvious fluctuation of symptoms with weather or environmental changes or other aggravating or alleviating factors except as outlined above   No unusual exposure hx or h/o childhood pna/ asthma or knowledge of premature birth.  Current Allergies, Complete Past Medical History, Past Surgical  History, Family History, and Social History were reviewed in [employer] record.  ROS  The following are not active complaints unless bolded Hoarseness, sore throat, dysphagia, dental problems, itching, sneezing,  nasal congestion or discharge of excess mucus or  purulent secretions, ear ache,   fever, chills, sweats, unintended wt loss or wt gain, classically pleuritic or exertional cp,  orthopnea pnd or arm/hand swelling  or leg swelling, presyncope, palpitations, abdominal pain, anorexia, nausea, vomiting, diarrhea  or change in bowel habits or change in bladder habits, change in stools or change in urine, dysuria, hematuria,  rash, arthralgias, visual complaints, headache, numbness, weakness or ataxia or problems with walking or coordination,  change in mood or  memory.        Current Meds  Medication Sig   albuterol (PROAIR HFA) 108 (90 Base) MCG/ACT inhaler 2 puffs every 4 hours as needed only  if your can't catch your breath (Patient taking differently: Inhale 2 puffs into the lungs every 4 (four) hours as needed for wheezing or shortness of breath. 2 puffs every 4 hours as needed only  if your can't catch your breath)   albuterol (PROVENTIL) (2.5 MG/3ML) 0.083% nebulizer solution TAKE 3 MLS BY NEBULIZATION EVERY 6 HOURS AS NEEDED FOR WHEEZING OR SHORTNESS OF BREATH. (Patient taking differently: Take 2.5 mg by nebulization every 6 (six) hours as needed for wheezing or shortness of breath.)   budesonide-formoterol (SYMBICORT) 80-4.5 MCG/ACT inhaler Inhale 2 puffs into the lungs 2 (two) times daily.   donepezil (ARICEPT) 5 MG tablet Take 5 mg by mouth at bedtime.   fluticasone (FLONASE) 50 MCG/ACT nasal spray Place 1 spray into both nostrils 2 (two) times daily.   lactulose (CONSTULOSE) 10 GM/15ML solution Take 15 mLs (10 g total) by mouth daily as needed for mild constipation.   linaclotide (LINZESS) 72 MCG capsule Take 1 capsule (72 mcg total) by mouth daily before  breakfast.   meclizine (ANTIVERT) 12.5 MG tablet Take 1-2 tablets (12.5-25 mg total) by mouth 3 (three) times daily as needed for dizziness.   memantine (NAMENDA) 10 MG tablet Take 1 tablet (10 mg) twice a day   triamcinolone cream (KENALOG) 0.1 % Apply 1 application topically 2 (two) times daily as needed (itching).                                Objective:   Physical Exam  01/05/2021       144  07/05/2020      144 03/30/2020      141 07/08/2019      155  05/26/2019        159  03/11/2019    152 12/09/2018      154   09/12/2018     158 03/15/2018    165  02/01/2018    161  01/04/2018      166  10/18/2017      166 07/17/2017       173   06/04/2017      176  05/21/2017       175  05/04/2017       173   03/17/2016    182   02/03/14 177 lb (80.287 kg)  01/27/14 177 lb 12 oz (80.627 kg)  01/06/14 173 lb 8 oz (78.699 kg)      Vital signs reviewed  01/05/2021  - Note at rest 02 sats  96% on RA   General appearance:    amb bm nad  / occ throat clearing    Report :Full dentures   HEENT : pt wearing mask not removed for exam due to covid - 19 concerns.   NECK :  without JVD/Nodes/TM/  nl carotid upstrokes bilaterally   LUNGS: no acc muscle use,  Min barrel  contour chest wall with bilateral  slightly decreased bs s audible wheeze and  without cough on insp or exp maneuvers and min  Hyperresonant  to  percussion bilaterally     CV:  RRR  no s3 or murmur or increase in P2, and no edema   ABD:  soft and nontender with pos end  insp Hoover's  in the supine position. No bruits or organomegaly appreciated, bowel sounds nl  MS:   Nl gait/  ext warm without deformities, calf tenderness, cyanosis or clubbing No obvious joint restrictions   SKIN: warm and dry without lesions    NEURO:  somber affect / very reserved attitude with no motor or cerebellar deficits apparent.                 Assessment & Plan:

## 2021-01-06 ENCOUNTER — Encounter: Payer: Self-pay | Admitting: Internal Medicine

## 2021-01-06 NOTE — Assessment & Plan Note (Signed)
Allergy profile 05/21/2017 >  Eos 0.2 /  IgE  15 RAST neg -  Sinus CT 05/29/2017 1. Bilateral maxillary sinusitis with fluid levels. 2. Chronic left sphenoid sinusitis with mild mucosal thickening > f/u [place]: ov 07/09/17 c/w persistent sinusitis > rec FESS> pt canceled  - repeat Sinus CT 02/14/2018 > Bilateral maxillary sinus disease seen on the most recent CT scan has markedly improved. There is mild residual mucosal thickening in the inferior maxillary sinuses bilaterally. Mild mucosal thickening in the sphenoid sinuses is greater on the left and has increased since the prior study. -  07/05/2020 rhinitis/ cough responsive to pred > refer to [facility] who prev rec FESS  > referred back 01/05/2021   F/u here in 6 m with all meds in hand using a trust but verify approach to confirm accurate Medication  Reconciliation The principal here is that until we are certain that the  patients are doing what we've asked, it makes no sense to ask them to do more.          Each maintenance medication was reviewed in detail including emphasizing most importantly the difference between maintenance and prns and under what circumstances the prns are to be triggered using an action plan format where appropriate.  Total time for H and P, chart review, counseling, reviewing hfa device(s) and generating customized AVS unique to this office visit / same day charting = 25 min

## 2021-01-06 NOTE — Assessment & Plan Note (Signed)
Quit smoking 2002  Spirometry 09/27/12  FEV1  1.06 (44%) ratio 47  - Spirometry 05/04/2017  FEV1 0.32 (16%)  Ratio 81 but f/v not physiologic   - 05/04/2017    > try symb 80 2bid as cough is the primary concern   - PFT's  07/17/2017  FEV1 1.17 (64 % ) ratio 64  p 20 % improvement from saba p nothing prior to study with DLCO  54 % corrects to 72  % for alv volume   - 09/12/2018 added pred as plan D - 05/26/2019  After extensive coaching inhaler device,  effectiveness =  75% > try breztri 2bid and start back on gerd rx  - 03/30/2020 back on symbicort 80 2bid with good control  -  03/30/2020   Walked RA  3 laps @ approx 254ft each @ moderately fast pace  stopped due to end of study, no sob, sats at end 97%   - 01/05/2021  After extensive coaching inhaler device,  effectiveness =    50% (short insp)   Pattern is more typical of AB driven by chronic sinus dz/ no change rx needed from pulmonary perspective

## 2021-01-20 ENCOUNTER — Ambulatory Visit: Payer: PPO | Admitting: Podiatry

## 2021-01-21 DIAGNOSIS — J449 Chronic obstructive pulmonary disease, unspecified: Secondary | ICD-10-CM | POA: Diagnosis not present

## 2021-01-21 DIAGNOSIS — K219 Gastro-esophageal reflux disease without esophagitis: Secondary | ICD-10-CM | POA: Diagnosis not present

## 2021-01-21 DIAGNOSIS — E785 Hyperlipidemia, unspecified: Secondary | ICD-10-CM | POA: Diagnosis not present

## 2021-01-21 DIAGNOSIS — I10 Essential (primary) hypertension: Secondary | ICD-10-CM | POA: Diagnosis not present

## 2021-01-29 ENCOUNTER — Other Ambulatory Visit: Payer: Self-pay | Admitting: Internal Medicine

## 2021-01-31 ENCOUNTER — Other Ambulatory Visit: Payer: Self-pay

## 2021-01-31 ENCOUNTER — Encounter (INDEPENDENT_AMBULATORY_CARE_PROVIDER_SITE_OTHER): Payer: PPO | Admitting: Ophthalmology

## 2021-01-31 DIAGNOSIS — I10 Essential (primary) hypertension: Secondary | ICD-10-CM | POA: Diagnosis not present

## 2021-01-31 DIAGNOSIS — H59033 Cystoid macular edema following cataract surgery, bilateral: Secondary | ICD-10-CM | POA: Diagnosis not present

## 2021-01-31 DIAGNOSIS — H43813 Vitreous degeneration, bilateral: Secondary | ICD-10-CM

## 2021-01-31 DIAGNOSIS — H35033 Hypertensive retinopathy, bilateral: Secondary | ICD-10-CM | POA: Diagnosis not present

## 2021-02-01 ENCOUNTER — Telehealth: Payer: Self-pay | Admitting: *Deleted

## 2021-02-01 NOTE — Telephone Encounter (Signed)
PA request was received from (pharmacy): [facility] Phone:[phone]  Fax: [phone] Medication name and strength: Albuterol (2.5mg /66ml) 0.083% nebulizer Ordering Provider: Dr. Sherene Sires  Was PA started with [facility]?: Yes If yes, please enter KEY: [id number] Medication tried and failed: Duoneb, pulmicort Covered Alternatives: unknown  PA sent to plan, time frame for approval / denial: 72 hrs Routing to [place] for follow-up

## 2021-02-04 MED ORDER — ALBUTEROL SULFATE (2.5 MG/3ML) 0.083% IN NEBU: INHALATION_SOLUTION | RESPIRATORY_TRACT | 3 refills | Status: AC

## 2021-02-04 NOTE — Telephone Encounter (Signed)
Received denial on albuterol nebs There was no dx provided on this rx  I [facility] with [facility]  Called [facility] to be sure this went through and was put on hold x 10 min  Will call back later

## 2021-02-09 ENCOUNTER — Encounter: Payer: Self-pay | Admitting: Podiatry

## 2021-02-09 ENCOUNTER — Other Ambulatory Visit: Payer: Self-pay

## 2021-02-09 ENCOUNTER — Ambulatory Visit: Payer: PPO | Admitting: Podiatry

## 2021-02-09 DIAGNOSIS — L603 Nail dystrophy: Secondary | ICD-10-CM | POA: Diagnosis not present

## 2021-02-09 NOTE — Progress Notes (Signed)
  Subjective:  Patient ID: Alexander [name], male    DOB: [birth year],   MRN: [id number]  Chief Complaint  Patient presents with   Nail Problem    The left big toenail is loose and is about to come off     87 y.o. male presents for concern of left big toenail that is loose. States it has been this way for a while and does not remember any injury . Denies any other pedal complaints. Denies n/v/f/c.   Past Medical History:  Diagnosis Date   Alzheimers disease (HCC)    mild   Anxiety    Asthma    BPH (benign prostatic hyperplasia)    CAD (coronary artery disease)    Constipation    COPD (chronic obstructive pulmonary disease) (HCC)    chronic dyspnea   Dementia (HCC)    wife answered he has Dementia   Depression    GERD (gastroesophageal reflux disease)    Hearing loss    Hemorrhoids    HYPERLIPIDEMIA    Hypertension    Hypertensive heart disease    LVH (left ventricular hypertrophy)    a. 2014 Echo: EF 65-70%, no rwma, Gr1 DD, mild MR.   Non-obstructive CAD (coronary artery disease)    a. 02/2009 Cath: essentially nl cors; b. 07/2014 MV: mild diaph atten, no ischemia-->low risk.    Objective:  Physical Exam: Vascular: DP/PT pulses 2/4 bilateral. CFT <3 seconds. Normal hair growth on digits. No edema.  Skin. No lacerations or abrasions bilateral feet. Left hallux nail thickened and distal portion of nail loose almost to base. New nail appears to be growing underneath.  Musculoskeletal: MMT 5/5 bilateral lower extremities in DF, PF, Inversion and Eversion. Deceased ROM in DF of ankle joint.  Neurological: Sensation intact to light touch.   Assessment:   1. Onychodystrophy      Plan:  Patient was evaluated and treated and all questions answered. Loose nail was trimmed back for patient. Just about the entire nail was removed. Some bleeding noted to proximal nail fold after removal. Neosporin and bandaid applied.  Discussed procedure and post procedure care and patient  expressed understanding.  Will follow-up as needed.      Louann Sjogren, DPM

## 2021-02-09 NOTE — Patient Instructions (Signed)

## 2021-02-21 DIAGNOSIS — E785 Hyperlipidemia, unspecified: Secondary | ICD-10-CM | POA: Diagnosis not present

## 2021-02-21 DIAGNOSIS — J449 Chronic obstructive pulmonary disease, unspecified: Secondary | ICD-10-CM | POA: Diagnosis not present

## 2021-02-21 DIAGNOSIS — I10 Essential (primary) hypertension: Secondary | ICD-10-CM | POA: Diagnosis not present

## 2021-03-14 DIAGNOSIS — M19012 Primary osteoarthritis, left shoulder: Secondary | ICD-10-CM | POA: Diagnosis not present

## 2021-03-23 DIAGNOSIS — I10 Essential (primary) hypertension: Secondary | ICD-10-CM | POA: Diagnosis not present

## 2021-03-23 DIAGNOSIS — E785 Hyperlipidemia, unspecified: Secondary | ICD-10-CM | POA: Diagnosis not present

## 2021-03-23 DIAGNOSIS — J449 Chronic obstructive pulmonary disease, unspecified: Secondary | ICD-10-CM | POA: Diagnosis not present

## 2021-03-24 NOTE — Progress Notes (Incomplete)
Assessment/Plan:    Moderate Dementia without behavioral disturbance 87 yo man with a history of moderate dementia, likely vascular, currently on Donepezil 5 mg daily and Memantine 10 mg daily, with some progression of the disease. MMSE today is 12/28. -SLUMS score 6/30 in May 2021.   Recommendations  Discussed safety both in and out of the home.   Discussed the importance of regular daily schedule .  Continue to monitor mood. Stay active at least 30 minutes at least 3 times a week. Information about [facility] was given  Naps should be scheduled and should be no longer than 60 minutes and should not occur after 2 PM.  Continue donepezil 5 mg daily Side effects  were discussed  Increase Memantine to 10 mg twice daily. Side effects discussed Follow up in 6 months.   Case discussed with Dr. Karel Jarvis who agrees with the plan       Subjective:   Alexander [name] is a 87 y.o. male with vascular risk factors including hypertension, hyperlipidemia, moderate dementia, likely vascular.  He is returning to the office in a follow-up visit. Last evaluation was on 10/26/20.    His wife reports that his memory is worse, repeating questions "over ", not remembering that he asked that question before.  She reports that "he is not grasping what she is trying to say ", which frustrates both.  She denies that the patient repeats stories.  His mood is stable, and denies depression.  His main complaint is being more irritable especially when he cannot find an object because he cannot remember where he left it.  He sleeps well, and denies vivid dreams or sleepwalking.  There is no paranoia or hallucinations.  He continues to be independent of bathing and dressing.  His wife now is in charge of his medications, finances and cooking.  Since last visit, he no longer drives.  He ambulates without difficulty, without a walker or a cane.  Denies headaches, trauma, falls or injuries to the head,  double vision, dizziness, focal numbness or tingling. He has BPV controlled with Antivert as mentioned above. Denies unilateral weakness or tremors. Denies urine incontinence or retention. Denies constipation or diarrhea. He is taking Donepezil 5mg  daily and Memantine 10mg  daily.    HPI: This is a pleasant 87 yo RH man with a history of hypertension, hyperlipidemia,with worsening memory loss. When asked about his memory, he states "there ain't none." He noticed memory changes in his late 42s, he would forget names, however recently he would be talking about something and forget what he was talking about. He misplaces things frequently. He got lost driving one time. He was picking his wife up one time and did not know where he was (15 years ago). He occasionally forgets to take his medications and has to be told repeatedly to take it. He has noticed word-finding difficulties. No problems performing ADLs independently. He lives with his wife.  His daughter has noticed changes more in the past year or so. She would tell him something then the next minute he forgets. He asks the same questions repeatedly. At the end of the visit, his daughter walked out to speak with me personally, and stated that her father underreported symptoms, and that she and her mother have been concerned about his driving. She did not want to mention this in front of him because he would get upset. She denies any paranoia. He states he sees things moving around especially at night,  like shadows, sometimes he thinks something is crawling around on the fence. He has constipation.  His father had memory loss.   I personally reviewed MRI brain done in 01/2014 which showed prominent mineral deposition in the globus pallidus, midbrain, and dentate nucleus. This may be normal but has been described in Parkinson's like syndromes. Moderate chronic microvascular change.  MRI brain showed moderate chronic microvascular disease    CT head on  06/05/20 IMPRESSION: 1. No acute intracranial abnormality.2. Chronic paranasal sinus disease.  PREVIOUS MEDICATIONS   CURRENT MEDICATIONS:  Outpatient Encounter Medications as of 03/25/2021  Medication Sig   albuterol (PROAIR HFA) 108 (90 Base) MCG/ACT inhaler 2 puffs every 4 hours as needed only  if your can't catch your breath (Patient taking differently: Inhale 2 puffs into the lungs every 4 (four) hours as needed for wheezing or shortness of breath. 2 puffs every 4 hours as needed only  if your can't catch your breath)   albuterol (PROVENTIL) (2.5 MG/3ML) 0.083% nebulizer solution TAKE 3 MLS BY NEBULIZATION EVERY 6 HOURS AS NEEDED FOR WHEEZING OR SHORTNESS OF BREATH.   budesonide-formoterol (SYMBICORT) 80-4.5 MCG/ACT inhaler Inhale 2 puffs into the lungs 2 (two) times daily.   donepezil (ARICEPT) 5 MG tablet Take 5 mg by mouth at bedtime.   fluticasone (FLONASE) 50 MCG/ACT nasal spray Place 1 spray into both nostrils 2 (two) times daily.   ketorolac (ACULAR) 0.5 % ophthalmic solution 1 drop 4 (four) times daily.   lactulose (CONSTULOSE) 10 GM/15ML solution Take 15 mLs (10 g total) by mouth daily as needed for mild constipation.   linaclotide (LINZESS) 72 MCG capsule Take 1 capsule (72 mcg total) by mouth daily before breakfast.   meclizine (ANTIVERT) 12.5 MG tablet Take 1-2 tablets (12.5-25 mg total) by mouth 3 (three) times daily as needed for dizziness.   memantine (NAMENDA) 10 MG tablet Take 1 tablet (10 mg) twice a day   prednisoLONE acetate (PRED FORTE) 1 % ophthalmic suspension 1 drop 4 (four) times daily.   triamcinolone cream (KENALOG) 0.1 % Apply 1 application topically 2 (two) times daily as needed (itching).   No facility-administered encounter medications on file as of 03/25/2021.     Objective:     PHYSICAL EXAMINATION:    VITALS:   There were no vitals filed for this visit.   GEN:  The patient appears stated age and is in NAD. HEENT:  Normocephalic, atraumatic.    Neurological examination:  General: NAD, well-groomed, appears stated age. Orientation: The patient is alert. Oriented to person, not to place or date.  Cranial nerves: There is good facial symmetry.The speech is less fluent, but clear. No aphasia or dysarthria. Fund of knowledge is reduced. Recent and remote memory are impaired. Attention and concentration are reduced.  Able to name objects and repeat phrases.  Hearing is intact to conversational tone.  He states that he cannot read or write  Sensation: Sensation is intact to light touch throughout Motor: Strength is at least antigravity x4.     Montreal Cognitive Assessment  09/12/2018  Visuospatial/ Executive (0/5) 0  Naming (0/3) 0  Attention: Read list of digits (0/2) 1  Attention: Read list of letters (0/1) 1  Attention: Serial 7 subtraction starting at 100 (0/3) 0  Language: Repeat phrase (0/2) 2  Language : Fluency (0/1) 0  Abstraction (0/2) 0  Delayed Recall (0/5) 1  Orientation (0/6) 4  Total 9  Adjusted Score (based on education) 10     MMSE -  Mini Mental State Exam 10/26/2020 05/30/2018 09/07/2017  Not completed: (No Data) - (No Data)  Orientation to time 0 0 -  Orientation to Place 0 3 -  Registration 3 3 -  Attention/ Calculation 0 3 -  Recall 3 0 -  Language- name 2 objects 2 2 -  Language- repeat 0 0 -  Language- follow 3 step command 3 3 -  Language- read & follow direction 0 0 -  Language-read & follow direction-comments unable to read - -  Write a sentence 0 0 -  Write a sentence-comments unable to write - -  Copy design 1 0 -  Total score 12 14 -      Movement examination: Tone: There is normal tone in the UE/LE Abnormal movements:  no tremor.  No myoclonus.  No asterixis.   Coordination:  There is no decremation with RAM's. Normal finger to nose  Gait and Station: The patient has no difficulty arising out of a deep-seated chair without the use of the hands. The patient's stride length is good.  Gait  is cautious and narrow.    CBC CBC Latest Ref Rng & Units 11/30/2020 11/28/2020 06/05/2020  WBC 4.0 - 10.5 K/uL 7.2 4.8 4.7  Hemoglobin 13.0 - 17.0 g/dL 11.6(L) 12.1(L) 13.5  Hematocrit 39.0 - 52.0 % 34.8(L) 38.1(L) 43.7  Platelets 150 - 400 K/uL 235 251 272     CMP Latest Ref Rng & Units 11/30/2020 11/28/2020 06/05/2020  Glucose 70 - 99 mg/dL 112(H) 101(H) 87  BUN 8 - 23 mg/dL 19 11 12   Creatinine 0.61 - 1.24 mg/dL 1.01 0.84 1.01  Sodium 135 - 145 mmol/L 139 142 141  Potassium 3.5 - 5.1 mmol/L 4.1 4.1 4.1  Chloride 98 - 111 mmol/L 102 107 103  CO2 22 - 32 mmol/L 31 28 26   Calcium 8.9 - 10.3 mg/dL 9.1 9.5 9.8  Total Protein 6.5 - 8.1 g/dL - - -  Total Bilirubin 0.3 - 1.2 mg/dL - - -  Alkaline Phos 38 - 126 U/L - - -  AST 15 - 41 U/L - - -  ALT 0 - 44 U/L - - -       Total time spent on today's visit was 60 minutes, including both face-to-face time and nonface-to-face time. Time included that spent on review of records (prior notes available to me/labs/imaging if pertinent), discussing treatment and goals, answering patient's questions and coordinating care.  Cc:  Leanna Battles, MD Sharene Butters, PA-C

## 2021-03-25 ENCOUNTER — Ambulatory Visit: Payer: PPO | Admitting: Physician Assistant

## 2021-03-26 ENCOUNTER — Other Ambulatory Visit: Payer: Self-pay | Admitting: Gastroenterology

## 2021-03-31 ENCOUNTER — Ambulatory Visit (INDEPENDENT_AMBULATORY_CARE_PROVIDER_SITE_OTHER): Payer: PPO | Admitting: Physician Assistant

## 2021-03-31 ENCOUNTER — Encounter: Payer: Self-pay | Admitting: Physician Assistant

## 2021-03-31 ENCOUNTER — Other Ambulatory Visit: Payer: Self-pay

## 2021-03-31 VITALS — BP 132/83 | HR 76 | Resp 18 | Ht 65.0 in | Wt 143.0 lb

## 2021-03-31 DIAGNOSIS — F03B0 Unspecified dementia, moderate, without behavioral disturbance, psychotic disturbance, mood disturbance, and anxiety: Secondary | ICD-10-CM | POA: Diagnosis not present

## 2021-03-31 MED ORDER — MEMANTINE HCL 10 MG PO TABS: ORAL_TABLET | ORAL | 3 refills | Status: DC

## 2021-03-31 NOTE — Patient Instructions (Addendum)
It was a pleasure to see you today at our office.   Recommendations:  Meds: Follow up in 6 months Continue donepezil 5 mg daily   Start Memantine : Take 1 tablet (10 mg at night) for 2 weeks, then increase to 1 tablet (10 mg) twice a day   Talk to your doctor about antidepressant to help the mood  Information about [facility] was given.    RECOMMENDATIONS FOR ALL PATIENTS WITH MEMORY PROBLEMS: 1. Continue to exercise (Recommend 30 minutes of walking everyday, or 3 hours every week) 2. Increase social interactions - continue going to [place] and enjoy social gatherings with friends and family 3. Eat healthy, avoid fried foods and eat more fruits and vegetables 4. Maintain adequate blood pressure, blood sugar, and blood cholesterol level. Reducing the risk of stroke and cardiovascular disease also helps promoting better memory. 5. Avoid stressful situations. Live a simple life and avoid aggravations. Organize your time and prepare for the next day in anticipation. 6. Sleep well, avoid any interruptions of sleep and avoid any distractions in the bedroom that may interfere with adequate sleep quality 7. Avoid sugar, avoid sweets as there is a strong link between excessive sugar intake, diabetes, and cognitive impairment We discussed the Mediterranean diet, which has been shown to help patients reduce the risk of progressive memory disorders and reduces cardiovascular risk. This includes eating fish, eat fruits and green leafy vegetables, nuts like almonds and hazelnuts, walnuts, and also use olive oil. Avoid fast foods and fried foods as much as possible. Avoid sweets and sugar as sugar use has been linked to worsening of memory function.  There is always a concern of gradual progression of memory problems. If this is the case, then we may need to adjust level of care according to patient needs. Support, both to the patient and caregiver, should then be put into place.        FALL PRECAUTIONS: Be cautious when walking. Scan the area for obstacles that may increase the risk of trips and falls. When getting up in the mornings, sit up at the edge of the bed for a few minutes before getting out of bed. Consider elevating the bed at the head end to avoid drop of blood pressure when getting up. Walk always in a well-lit room (use night lights in the walls). Avoid area rugs or power cords from appliances in the middle of the walkways. Use a walker or a cane if necessary and consider physical therapy for balance exercise. Get your eyesight checked regularly.  FINANCIAL OVERSIGHT: Supervision, especially oversight when making financial decisions or transactions is also recommended.  HOME SAFETY: Consider the safety of the kitchen when operating appliances like stoves, microwave oven, and blender. Consider having supervision and share cooking responsibilities until no longer able to participate in those. Accidents with firearms and other hazards in the house should be identified and addressed as well.   ABILITY TO BE LEFT ALONE: If patient is unable to contact 911 operator, consider using [facility], or when the need is there, arrange for someone to stay with patients. Smoking is a fire hazard, consider supervision or cessation. Risk of wandering should be assessed by caregiver and if detected at any point, supervision and safe proof recommendations should be instituted.  MEDICATION SUPERVISION: Inability to self-administer medication needs to be constantly addressed. Implement a mechanism to ensure safe administration of the medications.

## 2021-03-31 NOTE — Progress Notes (Signed)
Assessment/Plan:    Moderate Dementia without behavioral disturbance   Recommendations  Discussed safety both in and out of the home.   Discussed the importance of regular daily schedule .  Continue to monitor mood. Stay active at least 30 minutes at least 3 times a week. Information about [facility] was given  Naps should be scheduled and should be no longer than 60 minutes and should not occur after 2 PM.  Continue donepezil 5 mg daily Side effects  were discussed  Start Memantine to 10 mg twice daily. Side effects discussed Follow up in 6 months.   Case discussed with Dr. Everlena Cooper  who agrees with the plan       Subjective:   Alexander [name] is a 87 y.o. male with vascular risk factors including hypertension, hyperlipidemia, moderate dementia, likely vascular.  He is returning to the office in a follow-up visit. Last evaluation was on 10/26/20 at which time his MoCA was 12/30.  The patient initially was on donepezil 5 mg daily, and then during the last visit, it was recommended that memantine were initiated at 10 mg twice daily, although apparently the patient and the wife did not understand the instructions, and never took it.  On today's visit, his wife reports that his memory may be worse, repeating the questions over and over, not remembering that he has that before.  He also does not appear to be grasping what she is trying to say which frustrates both.  The symptoms were present during his last visit to a lesser extent.  However, his wife is more concerned about his mood, which appears more depressed.  She has an appointment with his PCP, as anxiety is being evaluated as well they are.  He continues to tell her "what is wrong with me?  ". Appears to enjoy activities outdoors, and has become more of an indoor person.  He sleeps well, denies any vivid dreams or sleepwalking, no paranoia.  He may have seen a shallow volar person recently, but this is very sporadic.   At times, he feels that there is someone in the house.  He continues to be independent of bathing and dressing, no hygiene concerns.  His wife is in charge of the medications, finances and cooking.  He no longer drives.  He ambulates without a walker or a cane without difficulty, denies any headaches, trauma, fall or injuries to the head, double vision, dizziness, focal numbness or tingling.  He has a history of BPV and is controlled with Antivert.  Denies unilateral weakness or tremors. Denies urine incontinence or retention. Denies constipation or diarrhea.    Initial HPI: This is a pleasant 87 yo RH man with a history of hypertension, hyperlipidemia,with worsening memory loss. When asked about his memory, he states "there ain't none." He noticed memory changes in his late 34s, he would forget names, however recently he would be talking about something and forget what he was talking about. He misplaces things frequently. He got lost driving one time. He was picking his wife up one time and did not know where he was (15 years ago). He occasionally forgets to take his medications and has to be told repeatedly to take it. He has noticed word-finding difficulties. No problems performing ADLs independently. He lives with his wife.  His daughter has noticed changes more in the past year or so. She would tell him something then the next minute he forgets. He asks the same questions repeatedly.  At the end of the visit, his daughter walked out to speak with me personally, and stated that her father underreported symptoms, and that she and her mother have been concerned about his driving. She did not want to mention this in front of him because he would get upset. She denies any paranoia. He states he sees things moving around especially at night, like shadows, sometimes he thinks something is crawling around on the fence. He has constipation.  His father had memory loss.   I personally reviewed MRI brain done in  01/2014 which showed prominent mineral deposition in the globus pallidus, midbrain, and dentate nucleus. This may be normal but has been described in Parkinson's like syndromes. Moderate chronic microvascular change.  MRI brain showed moderate chronic microvascular disease    CT head on 06/05/20 IMPRESSION: 1. No acute intracranial abnormality.2. Chronic paranasal sinus disease.  PREVIOUS MEDICATIONS   CURRENT MEDICATIONS:  Outpatient Encounter Medications as of 03/31/2021  Medication Sig   albuterol (PROAIR HFA) 108 (90 Base) MCG/ACT inhaler 2 puffs every 4 hours as needed only  if your can't catch your breath (Patient taking differently: Inhale 2 puffs into the lungs every 4 (four) hours as needed for wheezing or shortness of breath. 2 puffs every 4 hours as needed only  if your can't catch your breath)   albuterol (PROVENTIL) (2.5 MG/3ML) 0.083% nebulizer solution TAKE 3 MLS BY NEBULIZATION EVERY 6 HOURS AS NEEDED FOR WHEEZING OR SHORTNESS OF BREATH.   budesonide-formoterol (SYMBICORT) 80-4.5 MCG/ACT inhaler Inhale 2 puffs into the lungs 2 (two) times daily.   donepezil (ARICEPT) 5 MG tablet Take 5 mg by mouth at bedtime.   fluticasone (FLONASE) 50 MCG/ACT nasal spray Place 1 spray into both nostrils 2 (two) times daily.   ketorolac (ACULAR) 0.5 % ophthalmic solution 1 drop 4 (four) times daily.   lactulose (CONSTULOSE) 10 GM/15ML solution Take 15 mLs (10 g total) by mouth daily as needed for mild constipation.   linaclotide (LINZESS) 72 MCG capsule Take 1 capsule (72 mcg total) by mouth daily before breakfast.   meclizine (ANTIVERT) 12.5 MG tablet Take 1-2 tablets (12.5-25 mg total) by mouth 3 (three) times daily as needed for dizziness.   memantine (NAMENDA) 10 MG tablet Take 1 tablet (10 mg) twice a day   prednisoLONE acetate (PRED FORTE) 1 % ophthalmic suspension 1 drop 4 (four) times daily.   triamcinolone cream (KENALOG) 0.1 % Apply 1 application topically 2 (two) times daily as  needed (itching).   No facility-administered encounter medications on file as of 03/31/2021.     Objective:     PHYSICAL EXAMINATION:    VITALS:   There were no vitals filed for this visit.   GEN:  The patient appears stated age and is in NAD. HEENT:  Normocephalic, atraumatic.   Neurological examination:  General: NAD, well-groomed, appears stated age. Orientation: The patient is alert. Oriented to person, not to place or date.  Cranial nerves: There is good facial symmetry.The speech is less fluent, but clear. No aphasia or dysarthria. Fund of knowledge is reduced. Recent and remote memory are impaired. Attention and concentration are reduced.  Able to name objects and repeat phrases.  Hearing is intact to conversational tone.  He states that he cannot read or write  Sensation: Sensation is intact to light touch throughout Motor: Strength is at least antigravity x4.     Montreal Cognitive Assessment  09/12/2018  Visuospatial/ Executive (0/5) 0  Naming (0/3) 0  Attention: Read  list of digits (0/2) 1  Attention: Read list of letters (0/1) 1  Attention: Serial 7 subtraction starting at 100 (0/3) 0  Language: Repeat phrase (0/2) 2  Language : Fluency (0/1) 0  Abstraction (0/2) 0  Delayed Recall (0/5) 1  Orientation (0/6) 4  Total 9  Adjusted Score (based on education) 10     MMSE - Mini Mental State Exam 10/26/2020 05/30/2018 09/07/2017  Not completed: (No Data) - (No Data)  Orientation to time 0 0 -  Orientation to Place 0 3 -  Registration 3 3 -  Attention/ Calculation 0 3 -  Recall 3 0 -  Language- name 2 objects 2 2 -  Language- repeat 0 0 -  Language- follow 3 step command 3 3 -  Language- read & follow direction 0 0 -  Language-read & follow direction-comments unable to read - -  Write a sentence 0 0 -  Write a sentence-comments unable to write - -  Copy design 1 0 -  Total score 12 14 -      Movement examination: Tone: There is normal tone in the  UE/LE Abnormal movements:  no tremor.  No myoclonus.  No asterixis.   Coordination:  There is no decremation with RAM's. Normal finger to nose  Gait and Station: The patient has no difficulty arising out of a deep-seated chair without the use of the hands. The patient's stride length is good.  Gait is cautious and narrow.    CBC CBC Latest Ref Rng & Units 11/30/2020 11/28/2020 06/05/2020  WBC 4.0 - 10.5 K/uL 7.2 4.8 4.7  Hemoglobin 13.0 - 17.0 g/dL 11.6(L) 12.1(L) 13.5  Hematocrit 39.0 - 52.0 % 34.8(L) 38.1(L) 43.7  Platelets 150 - 400 K/uL 235 251 272     CMP Latest Ref Rng & Units 11/30/2020 11/28/2020 06/05/2020  Glucose 70 - 99 mg/dL 112(H) 101(H) 87  BUN 8 - 23 mg/dL 19 11 12   Creatinine 0.61 - 1.24 mg/dL 1.01 0.84 1.01  Sodium 135 - 145 mmol/L 139 142 141  Potassium 3.5 - 5.1 mmol/L 4.1 4.1 4.1  Chloride 98 - 111 mmol/L 102 107 103  CO2 22 - 32 mmol/L 31 28 26   Calcium 8.9 - 10.3 mg/dL 9.1 9.5 9.8  Total Protein 6.5 - 8.1 g/dL - - -  Total Bilirubin 0.3 - 1.2 mg/dL - - -  Alkaline Phos 38 - 126 U/L - - -  AST 15 - 41 U/L - - -  ALT 0 - 44 U/L - - -       Total time spent on today's visit was 60 minutes, including both face-to-face time and nonface-to-face time. Time included that spent on review of records (prior notes available to me/labs/imaging if pertinent), discussing treatment and goals, answering patient's questions and coordinating care.  Cc:  Leanna Battles, MD Sharene Butters, PA-C

## 2021-04-04 ENCOUNTER — Encounter (INDEPENDENT_AMBULATORY_CARE_PROVIDER_SITE_OTHER): Payer: PPO | Admitting: Ophthalmology

## 2021-04-04 ENCOUNTER — Other Ambulatory Visit: Payer: Self-pay

## 2021-04-04 DIAGNOSIS — I10 Essential (primary) hypertension: Secondary | ICD-10-CM

## 2021-04-04 DIAGNOSIS — H43813 Vitreous degeneration, bilateral: Secondary | ICD-10-CM

## 2021-04-04 DIAGNOSIS — H35033 Hypertensive retinopathy, bilateral: Secondary | ICD-10-CM

## 2021-04-04 DIAGNOSIS — H59033 Cystoid macular edema following cataract surgery, bilateral: Secondary | ICD-10-CM | POA: Diagnosis not present

## 2021-04-22 DIAGNOSIS — E785 Hyperlipidemia, unspecified: Secondary | ICD-10-CM | POA: Diagnosis not present

## 2021-04-22 DIAGNOSIS — J449 Chronic obstructive pulmonary disease, unspecified: Secondary | ICD-10-CM | POA: Diagnosis not present

## 2021-04-22 DIAGNOSIS — I10 Essential (primary) hypertension: Secondary | ICD-10-CM | POA: Diagnosis not present

## 2021-04-28 ENCOUNTER — Other Ambulatory Visit: Payer: Self-pay

## 2021-04-28 ENCOUNTER — Telehealth: Payer: Self-pay | Admitting: Physician Assistant

## 2021-04-28 DIAGNOSIS — H903 Sensorineural hearing loss, bilateral: Secondary | ICD-10-CM | POA: Diagnosis not present

## 2021-04-28 MED ORDER — MEMANTINE HCL 10 MG PO TABS: ORAL_TABLET | ORAL | 3 refills | Status: DC

## 2021-04-28 NOTE — Telephone Encounter (Signed)
Melissa from [facility] Left a message, she needs someone to call her regarding patients namenda 10mg . They need an RX sent to [facility]. number is [phone]

## 2021-04-28 NOTE — Telephone Encounter (Signed)
[facility] to [facility]. 

## 2021-04-29 ENCOUNTER — Ambulatory Visit: Payer: PPO | Admitting: Physician Assistant

## 2021-05-02 ENCOUNTER — Ambulatory Visit: Payer: PPO | Admitting: Cardiovascular Disease

## 2021-05-02 ENCOUNTER — Telehealth: Payer: Self-pay | Admitting: Physician Assistant

## 2021-05-02 ENCOUNTER — Other Ambulatory Visit: Payer: Self-pay

## 2021-05-02 NOTE — Telephone Encounter (Signed)
Spoke to wife, and they will discuss medication when they come in

## 2021-05-02 NOTE — Telephone Encounter (Signed)
Patients wife said she gave the wrong number, the correct number is [phone].

## 2021-05-02 NOTE — Telephone Encounter (Signed)
Patient returned call

## 2021-05-02 NOTE — Telephone Encounter (Signed)
Patients wife would like a call back to discuss some things with sara. She would like to talk with sara.

## 2021-05-02 NOTE — Telephone Encounter (Signed)
No answer at 142 05/02/2020

## 2021-05-02 NOTE — Telephone Encounter (Signed)
No answer at 120  

## 2021-05-02 NOTE — Telephone Encounter (Signed)
Patient wife would like to speak to someone about the patient medication Memantine. She states that he takes one in the Morning and 2 in the afternoon but he is still very dizzy and jsut not himself. Could this be the medication? Please call her back

## 2021-05-02 NOTE — Telephone Encounter (Signed)
05/11/21 at 3:30 PM for a visit with Huntley Dec.  Patient wants a call back from a nurse before then.

## 2021-05-05 ENCOUNTER — Telehealth: Payer: Self-pay | Admitting: Gastroenterology

## 2021-05-05 ENCOUNTER — Telehealth: Payer: Self-pay | Admitting: Internal Medicine

## 2021-05-05 MED ORDER — LINACLOTIDE 72 MCG PO CAPS: 72.0000 ug | ORAL_CAPSULE | Freq: Every day | ORAL | 0 refills | Status: DC

## 2021-05-05 NOTE — Telephone Encounter (Signed)
Inbound call from patient's PCP requesting additional refills for Linzess to be sent to [facility] in chart please.

## 2021-05-05 NOTE — Telephone Encounter (Signed)
Sent in 30 day supply of Linzess to [facility]. Patient needs office visit.

## 2021-05-05 NOTE — Telephone Encounter (Signed)
Called and spoke with patient's wife Alexander [name]. She stated that she and the patient were just told about the [facility] switch this morning. She is still not clear on why they can not continue to use their regular pharmacy [facility] on [address]   She will call his PCP's office to see what is going on. She is aware that I will not send any medications until hearing back from her. Will leave this encounter open in triage.

## 2021-05-05 NOTE — Telephone Encounter (Signed)
Rod Holler called back. She stated that she called Dr. Shon Baton office but was told he was in the office today. I advised her that I would call the office myself to see what is going.   I called Melissa back at [phone]. The prompt stated that it was a Biomedical engineer. I was transferred over to [facility] but she did not answer. Left her a detailed message to call us back to explain what is going on.

## 2021-05-11 ENCOUNTER — Ambulatory Visit: Payer: PPO | Admitting: Physician Assistant

## 2021-05-16 ENCOUNTER — Ambulatory Visit (INDEPENDENT_AMBULATORY_CARE_PROVIDER_SITE_OTHER): Payer: PPO | Admitting: Physician Assistant

## 2021-05-16 ENCOUNTER — Encounter: Payer: Self-pay | Admitting: Physician Assistant

## 2021-05-16 ENCOUNTER — Other Ambulatory Visit: Payer: Self-pay

## 2021-05-16 VITALS — BP 137/72 | HR 98 | Resp 18 | Ht 63.0 in | Wt 145.0 lb

## 2021-05-16 DIAGNOSIS — F03B18 Unspecified dementia, moderate, with other behavioral disturbance: Secondary | ICD-10-CM | POA: Diagnosis not present

## 2021-05-16 NOTE — Progress Notes (Signed)
Assessment/Plan:    Moderate Dementia without behavioral disturbance   Recommendations  Discussed safety both in and out of the home.   Discussed the importance of regular daily schedule .  Continue to monitor mood by PCP. Stay active at least 30 minutes at least 3 times a week. Information about [facility] was given  Naps should be scheduled and should be no longer than 60 minutes and should not occur after 2 PM.  Continue donepezil 5 mg daily Side effects  were discussed  Re-Start Memantine at 5 mg bid, as the patient experienced some dizziness due to overmedication.  Will increase to 10 mg twice daily if tolerated.  Side effects discussed Follow up in 6 months.   Case discussed with Dr. Everlena Cooper  who agrees with the plan       Subjective:   Alexander [name] is a 88 y.o. male with vascular risk factors including hypertension, hyperlipidemia, moderate dementia, likely vascular.  He is returning to the office in a follow-up visit. Last evaluation was on 10/26/20 at which time his MoCA was 12/30.  The patient initially was on donepezil 5 mg daily, and then during the last visit, it was recommended that memantine were initiated at 10 mg twice daily, although apparently the patient and the wife did not understand the instructions, and behaving 10 mg in the morning, and 20 mg at night.  His daughter is with him today, and has provided details about her father's memory.  She is concerned that her mother may not be giving the medications accordingly.  His memory is about the same from prior evaluation, and he may be having some hallucinations.  He continues to forget the names of the family members.  He is also having some paranoia and is on Depakote 125 mg qhs.  She is concerned at this moment about his memory, and his behavior is to be readdressed on the next appointment.  His depression is treated with Celexa by PCP.  He sleeps well, denies a but does have vivid dreams.    At  times, he feels that there is someone in the house when no one is there.  He continues to be independent of bathing and dressing, no hygiene concerns.  His wife is in charge of the medications, finances cooking and driving.  His appetite is good, but denies drinking enough water.  He denies trouble swallowing.  He ambulates without a walker or a cane without difficulty, denies any headaches, trauma, fall or injuries to the head, double vision, dizziness, focal numbness or tingling.  He has a history of BPV and is controlled with Antivert.  Denies unilateral weakness or tremors. Denies urine incontinence or retention. Denies constipation or diarrhea.    Initial HPI: This is a pleasant 88 yo RH man with a history of hypertension, hyperlipidemia,with worsening memory loss. When asked about his memory, he states "there ain't none." He noticed memory changes in his late 73s, he would forget names, however recently he would be talking about something and forget what he was talking about. He misplaces things frequently. He got lost driving one time. He was picking his wife up one time and did not know where he was (15 years ago). He occasionally forgets to take his medications and has to be told repeatedly to take it. He has noticed word-finding difficulties. No problems performing ADLs independently. He lives with his wife.  His daughter has noticed changes more in the past year or  so. She would tell him something then the next minute he forgets. He asks the same questions repeatedly. At the end of the visit, his daughter walked out to speak with me personally, and stated that her father underreported symptoms, and that she and her mother have been concerned about his driving. She did not want to mention this in front of him because he would get upset. She denies any paranoia. He states he sees things moving around especially at night, like shadows, sometimes he thinks something is crawling around on the fence. He has  constipation.  His father had memory loss.   I personally reviewed MRI brain done in 01/2014 which showed prominent mineral deposition in the globus pallidus, midbrain, and dentate nucleus. This may be normal but has been described in Parkinson's like syndromes. Moderate chronic microvascular change.  MRI brain showed moderate chronic microvascular disease    CT head on 06/05/20 IMPRESSION: 1. No acute intracranial abnormality.2. Chronic paranasal sinus disease.  PREVIOUS MEDICATIONS   CURRENT MEDICATIONS:  Outpatient Encounter Medications as of 05/16/2021  Medication Sig   albuterol (PROAIR HFA) 108 (90 Base) MCG/ACT inhaler 2 puffs every 4 hours as needed only  if your can't catch your breath (Patient taking differently: Inhale 2 puffs into the lungs every 4 (four) hours as needed for wheezing or shortness of breath. 2 puffs every 4 hours as needed only  if your can't catch your breath)   albuterol (PROVENTIL) (2.5 MG/3ML) 0.083% nebulizer solution TAKE 3 MLS BY NEBULIZATION EVERY 6 HOURS AS NEEDED FOR WHEEZING OR SHORTNESS OF BREATH.   budesonide-formoterol (SYMBICORT) 80-4.5 MCG/ACT inhaler Inhale 2 puffs into the lungs 2 (two) times daily.   divalproex (DEPAKOTE SPRINKLE) 125 MG capsule 1 capsule   donepezil (ARICEPT) 5 MG tablet Take 5 mg by mouth at bedtime.   fluticasone (FLONASE) 50 MCG/ACT nasal spray Place 1 spray into both nostrils 2 (two) times daily.   ketorolac (ACULAR) 0.5 % ophthalmic solution 1 drop 4 (four) times daily.   lactulose (CONSTULOSE) 10 GM/15ML solution Take 15 mLs (10 g total) by mouth daily as needed for mild constipation.   linaclotide (LINZESS) 72 MCG capsule Take 1 capsule (72 mcg total) by mouth daily before breakfast.   meclizine (ANTIVERT) 12.5 MG tablet Take 1-2 tablets (12.5-25 mg total) by mouth 3 (three) times daily as needed for dizziness.   memantine (NAMENDA) 10 MG tablet Take 1 tablet (10 mg at night) for 2 weeks, then increase to 1 tablet (10 mg)  twice a day   prednisoLONE acetate (PRED FORTE) 1 % ophthalmic suspension 1 drop 4 (four) times daily.   triamcinolone cream (KENALOG) 0.1 % Apply 1 application topically 2 (two) times daily as needed (itching).   No facility-administered encounter medications on file as of 05/16/2021.     Objective:     PHYSICAL EXAMINATION:    VITALS:   Vitals:   05/16/21 1108  BP: 137/72  Pulse: 98  Resp: 18  SpO2: 93%  Weight: 145 lb (65.8 kg)  Height: 5\' 3"  (1.6 m)     GEN:  The patient appears stated age and is in NAD. HEENT:  Normocephalic, atraumatic.   Neurological examination:  General: NAD, well-groomed, appears stated age. Orientation: The patient is alert. Oriented to person, not to place or date.  Cranial nerves: There is good facial symmetry.The speech is less fluent, but clear. No aphasia or dysarthria. Fund of knowledge is reduced. Recent and remote memory are impaired. Attention and concentration  are reduced.  Able to name objects and repeat phrases.  Hearing is intact to conversational tone.  He states that he cannot read or write  Sensation: Sensation is intact to light touch throughout Motor: Strength is at least antigravity x4.     Montreal Cognitive Assessment  09/12/2018  Visuospatial/ Executive (0/5) 0  Naming (0/3) 0  Attention: Read list of digits (0/2) 1  Attention: Read list of letters (0/1) 1  Attention: Serial 7 subtraction starting at 100 (0/3) 0  Language: Repeat phrase (0/2) 2  Language : Fluency (0/1) 0  Abstraction (0/2) 0  Delayed Recall (0/5) 1  Orientation (0/6) 4  Total 9  Adjusted Score (based on education) 10     MMSE - Mini Mental State Exam 10/26/2020 05/30/2018 09/07/2017  Not completed: (No Data) - (No Data)  Orientation to time 0 0 -  Orientation to Place 0 3 -  Registration 3 3 -  Attention/ Calculation 0 3 -  Recall 3 0 -  Language- name 2 objects 2 2 -  Language- repeat 0 0 -  Language- follow 3 step command 3 3 -  Language-  read & follow direction 0 0 -  Language-read & follow direction-comments unable to read - -  Write a sentence 0 0 -  Write a sentence-comments unable to write - -  Copy design 1 0 -  Total score 12 14 -      Movement examination: Tone: There is normal tone in the UE/LE Abnormal movements:  no tremor.  No myoclonus.  No asterixis.   Coordination:  There is no decremation with RAM's. Normal finger to nose  Gait and Station: The patient has no difficulty arising out of a deep-seated chair without the use of the hands. The patient's stride length is good.  Gait is cautious and narrow.      Total time spent on today's visit was 40 minutes, including both face-to-face time and nonface-to-face time. Time included that spent on review of records (prior notes available to me/labs/imaging if pertinent), discussing treatment and goals, answering patient's questions and coordinating care.  Cc:  [name], MD Marlowe Kays, PA-C

## 2021-05-16 NOTE — Patient Instructions (Signed)
It was a pleasure to see you today at our office.   Recommendations:  Meds: Follow up in 6 months Continue donepezil 5 mg daily in the morning  Continue Memantine: Take 1/2 tablet (5 mg  2 x a day  for 2 weeks, then increase to 1 tablet (10 mg) twice a day   Consider [facility]  .PLease feel free to call Sidney Ace, Social Worker at [facility] at [phone] for guidance if placement were needed at the facility. Also, you can call [facility] [phone] for guidance    RECOMMENDATIONS FOR ALL PATIENTS WITH MEMORY PROBLEMS: 1. Continue to exercise (Recommend 30 minutes of walking everyday, or 3 hours every week) 2. Increase social interactions - continue going to [place] and enjoy social gatherings with friends and family 3. Eat healthy, avoid fried foods and eat more fruits and vegetables 4. Maintain adequate blood pressure, blood sugar, and blood cholesterol level. Reducing the risk of stroke and cardiovascular disease also helps promoting better memory. 5. Avoid stressful situations. Live a simple life and avoid aggravations. Organize your time and prepare for the next day in anticipation. 6. Sleep well, avoid any interruptions of sleep and avoid any distractions in the bedroom that may interfere with adequate sleep quality 7. Avoid sugar, avoid sweets as there is a strong link between excessive sugar intake, diabetes, and cognitive impairment We discussed the Mediterranean diet, which has been shown to help patients reduce the risk of progressive memory disorders and reduces cardiovascular risk. This includes eating fish, eat fruits and green leafy vegetables, nuts like almonds and hazelnuts, walnuts, and also use olive oil. Avoid fast foods and fried foods as much as possible. Avoid sweets and sugar as sugar use has been linked to worsening of memory function.  There is always a concern of gradual progression of memory problems. If this is the  case, then we may need to adjust level of care according to patient needs. Support, both to the patient and caregiver, should then be put into place.       FALL PRECAUTIONS: Be cautious when walking. Scan the area for obstacles that may increase the risk of trips and falls. When getting up in the mornings, sit up at the edge of the bed for a few minutes before getting out of bed. Consider elevating the bed at the head end to avoid drop of blood pressure when getting up. Walk always in a well-lit room (use night lights in the walls). Avoid area rugs or power cords from appliances in the middle of the walkways. Use a walker or a cane if necessary and consider physical therapy for balance exercise. Get your eyesight checked regularly.  FINANCIAL OVERSIGHT: Supervision, especially oversight when making financial decisions or transactions is also recommended.  HOME SAFETY: Consider the safety of the kitchen when operating appliances like stoves, microwave oven, and blender. Consider having supervision and share cooking responsibilities until no longer able to participate in those. Accidents with firearms and other hazards in the house should be identified and addressed as well.   ABILITY TO BE LEFT ALONE: If patient is unable to contact 911 operator, consider using [employer], or when the need is there, arrange for someone to stay with patients. Smoking is a fire hazard, consider supervision or cessation. Risk of wandering should be assessed by caregiver and if detected at any point, supervision and safe proof recommendations should be instituted.  MEDICATION SUPERVISION: Inability to self-administer medication needs to be constantly  addressed. Implement a mechanism to ensure safe administration of the medications.

## 2021-05-19 DIAGNOSIS — G309 Alzheimer's disease, unspecified: Secondary | ICD-10-CM | POA: Diagnosis not present

## 2021-05-19 DIAGNOSIS — R2681 Unsteadiness on feet: Secondary | ICD-10-CM | POA: Diagnosis not present

## 2021-05-19 DIAGNOSIS — R42 Dizziness and giddiness: Secondary | ICD-10-CM | POA: Diagnosis not present

## 2021-05-20 ENCOUNTER — Other Ambulatory Visit: Payer: Self-pay | Admitting: Physician Assistant

## 2021-05-22 DIAGNOSIS — E785 Hyperlipidemia, unspecified: Secondary | ICD-10-CM | POA: Diagnosis not present

## 2021-05-22 DIAGNOSIS — I10 Essential (primary) hypertension: Secondary | ICD-10-CM | POA: Diagnosis not present

## 2021-05-22 DIAGNOSIS — J449 Chronic obstructive pulmonary disease, unspecified: Secondary | ICD-10-CM | POA: Diagnosis not present

## 2021-05-25 ENCOUNTER — Ambulatory Visit (INDEPENDENT_AMBULATORY_CARE_PROVIDER_SITE_OTHER): Payer: PPO

## 2021-05-25 ENCOUNTER — Other Ambulatory Visit: Payer: Self-pay

## 2021-05-25 ENCOUNTER — Ambulatory Visit: Payer: PPO | Admitting: Cardiovascular Disease

## 2021-05-25 ENCOUNTER — Encounter: Payer: Self-pay | Admitting: Cardiovascular Disease

## 2021-05-25 DIAGNOSIS — R42 Dizziness and giddiness: Secondary | ICD-10-CM

## 2021-05-25 DIAGNOSIS — E785 Hyperlipidemia, unspecified: Secondary | ICD-10-CM

## 2021-05-25 DIAGNOSIS — I10 Essential (primary) hypertension: Secondary | ICD-10-CM | POA: Diagnosis not present

## 2021-05-25 DIAGNOSIS — J449 Chronic obstructive pulmonary disease, unspecified: Secondary | ICD-10-CM | POA: Diagnosis not present

## 2021-05-25 DIAGNOSIS — F0390 Unspecified dementia without behavioral disturbance: Secondary | ICD-10-CM

## 2021-05-25 NOTE — Progress Notes (Unsigned)
Enrolled patient for a 3 day Zio XT monitor to be mailed to patients home  
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
## 2021-05-25 NOTE — Patient Instructions (Signed)
Testing/Procedures:  Christena Deem- Long Term Monitor Instructions  Your physician has requested you wear a ZIO patch monitor for 3  days.  This is a single patch monitor. [employer] supplies one patch monitor per enrollment. Additional stickers are not available. Please do not apply patch if you will be having a Nuclear Stress Test,  Echocardiogram, Cardiac CT, MRI, or Chest Xray during the period you would be wearing the  monitor. The patch cannot be worn during these tests. You cannot remove and re-apply the  ZIO XT patch monitor.  Your ZIO patch monitor will be mailed 3 day USPS to your address on file. It may take 3-5 days  to receive your monitor after you have been enrolled.  Once you have received your monitor, please review the enclosed instructions. Your monitor  has already been registered assigning a specific monitor serial # to you.  Billing and Patient Assistance Program Information  We have supplied [employer] with any of your insurance information on file for billing purposes. [employer] offers a sliding scale Patient Assistance Program for patients that do not have  insurance, or whose insurance does not completely cover the cost of the ZIO monitor.  You must apply for the Patient Assistance Program to qualify for this discounted rate.  To apply, please call [employer] at [phone], select option 4, select option 2, ask to apply for  Patient Assistance Program. [name] will ask your household income, and how many people  are in your household. They will quote your out-of-pocket cost based on that information.  [employer] will also be able to set up a 72-month, interest-free payment plan if needed.  Applying the monitor   Shave hair from upper left chest.  Hold abrader disc by orange tab. Rub abrader in 40 strokes over the upper left chest as  indicated in your monitor instructions.  Clean area with 4 enclosed alcohol pads. Let dry.  Apply patch as indicated in monitor instructions.  Patch will be placed under collarbone on left  side of chest with arrow pointing upward.  Rub patch adhesive wings for 2 minutes. Remove white label marked "1". Remove the white  label marked "2". Rub patch adhesive wings for 2 additional minutes.  While looking in a mirror, press and release button in center of patch. A small green light will  flash 3-4 times. This will be your only indicator that the monitor has been turned on.  Do not shower for the first 24 hours. You may shower after the first 24 hours.  Press the button if you feel a symptom. You will hear a small click. Record Date, Time and  Symptom in the Patient Logbook.  When you are ready to remove the patch, follow instructions on the last 2 pages of Patient  Logbook. Stick patch monitor onto the last page of Patient Logbook.  Place Patient Logbook in the blue and white box. Use locking tab on box and tape box closed  securely. The blue and white box has prepaid postage on it. Please place it in the mailbox as  soon as possible. Your physician should have your test results approximately 7 days after the  monitor has been mailed back to [facility].  Call [facility] Customer Care at [phone] if you have questions regarding  your ZIO XT patch monitor. Call them immediately if you see an orange light blinking on your  monitor.  If your monitor falls off in less than 4 days, contact our Monitor department at [phone].  If your monitor becomes loose or falls off after 4 days call [employer] at [phone] for  suggestions on securing your monitor    Follow-Up: At [facility], you and your health needs are our priority.  As part of our continuing mission to provide you with exceptional heart care, we have created designated Provider Care Teams.  These Care Teams include your primary Cardiologist (physician) and Advanced Practice Providers (APPs -  Physician Assistants and Nurse Practitioners) who all work together to  provide you with the care you need, when you need it.  We recommend signing up for the patient portal called "MyChart".  Sign up information is provided on this After Visit Summary.  MyChart is used to connect with patients for Virtual Visits (Telemedicine).  Patients are able to view lab/test results, encounter notes, upcoming appointments, etc.  Non-urgent messages can be sent to your provider as well.   To learn more about what you can do with MyChart, go to [email or web address].    Your next appointment:   6 month(s)  The format for your next appointment:   In Person  Provider:  Marjie Skiff PA-C

## 2021-05-29 DIAGNOSIS — R42 Dizziness and giddiness: Secondary | ICD-10-CM | POA: Diagnosis not present

## 2021-05-30 ENCOUNTER — Telehealth: Payer: Self-pay | Admitting: Neurology

## 2021-05-30 NOTE — Telephone Encounter (Signed)
Pt's daughter called back in after the phone call was disconnected. She would like a call back with an update on the pt's next steps.

## 2021-05-30 NOTE — Telephone Encounter (Signed)
Namenda 5mg  bid and divaloprex 175mch 1 ta po qhs, , after two weeks, he is starting to lay down after 4 o'clock, any thoughts per family, to see if meds need to be adjusted, getting worse with memory,with hallucinations.

## 2021-05-30 NOTE — Telephone Encounter (Signed)
Patients wife called just to follow up on the medication Divalproex.  Patient has been taking it for two weeks, was supposed to call back to give an update and know the next steps.

## 2021-05-30 NOTE — Telephone Encounter (Signed)
Advised of med change dose, daughter thanked me for calling. To take namenda 10mg  at hs now and continue meds as prescribed.

## 2021-05-30 NOTE — Telephone Encounter (Signed)
Left message to call office back at [phone]

## 2021-06-05 ENCOUNTER — Encounter: Payer: Self-pay | Admitting: Cardiovascular Disease

## 2021-06-06 ENCOUNTER — Telehealth: Payer: Self-pay | Admitting: Physician Assistant

## 2021-06-06 NOTE — Telephone Encounter (Signed)
Spoke with wife Alexander [name] regarding medication

## 2021-06-06 NOTE — Telephone Encounter (Signed)
Sonja sharpe, pt daughter called and LM with AN. Unsure about DPR, there seem to be different ones. She found out his PCP prescribed father meclizine and she needs to know if its ok to take Daughters number  [phone]

## 2021-06-06 NOTE — Telephone Encounter (Signed)
Meclizine is for dizziness. Aricept is for memory loss, should be ok to take meclizine. Thanks

## 2021-06-07 DIAGNOSIS — R42 Dizziness and giddiness: Secondary | ICD-10-CM | POA: Diagnosis not present

## 2021-06-08 ENCOUNTER — Other Ambulatory Visit: Payer: Self-pay

## 2021-06-08 ENCOUNTER — Ambulatory Visit
Admission: EM | Admit: 2021-06-08 | Discharge: 2021-06-08 | Disposition: A | Discharge status: Home / Self Care | Payer: PPO

## 2021-06-08 DIAGNOSIS — R197 Diarrhea, unspecified: Secondary | ICD-10-CM

## 2021-06-08 NOTE — ED Triage Notes (Signed)
Pt c/o diarrhea, states he usually does not have regular bowl movements and takes linzess. Today he took his medication and drank prune juice which caused an episode of loose stool.   Onset ~ today
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
## 2021-06-08 NOTE — Discharge Instructions (Signed)
Please go to the emergency department if symptoms persist or worsen.

## 2021-06-13 DIAGNOSIS — H35353 Cystoid macular degeneration, bilateral: Secondary | ICD-10-CM | POA: Diagnosis not present

## 2021-06-13 DIAGNOSIS — H35033 Hypertensive retinopathy, bilateral: Secondary | ICD-10-CM | POA: Diagnosis not present

## 2021-06-13 DIAGNOSIS — H04123 Dry eye syndrome of bilateral lacrimal glands: Secondary | ICD-10-CM | POA: Diagnosis not present

## 2021-06-13 DIAGNOSIS — H401231 Low-tension glaucoma, bilateral, mild stage: Secondary | ICD-10-CM | POA: Diagnosis not present

## 2021-06-13 DIAGNOSIS — H35372 Puckering of macula, left eye: Secondary | ICD-10-CM | POA: Diagnosis not present

## 2021-06-16 ENCOUNTER — Telehealth: Payer: Self-pay | Admitting: Physician Assistant

## 2021-06-16 NOTE — Telephone Encounter (Signed)
Patient is taken depakote 125mg  1 tab po qhs and Namenda 5mg  bid please advise

## 2021-06-16 NOTE — Telephone Encounter (Signed)
Patients daughter called and stated that her father is staying awake all night, not sleeping.  He paces all night and goes in and out of the house. She stated her mother gets scared of him sometime, Becky Sax is scared to leave them alone at night.  He is on the divalproex.

## 2021-06-17 ENCOUNTER — Other Ambulatory Visit: Payer: Self-pay

## 2021-06-17 MED ORDER — TRAZODONE HCL 50 MG PO TABS: ORAL_TABLET | ORAL | 1 refills | Status: DC

## 2021-06-17 NOTE — Progress Notes (Signed)
Signed.

## 2021-06-17 NOTE — Telephone Encounter (Signed)
We can start Trazodone 50mg : 1/2 tablet every night for 3 days, then increase to 1 tablet every night. Main side effect is drowsiness, but we want him to rest at night. If they are agreeable, pls send in Rx, thanks

## 2021-06-17 NOTE — Telephone Encounter (Signed)
Family would like to start medication, I pended Rx and sent Rx to be signed by Dr.Karen Delice Lesch

## 2021-06-21 ENCOUNTER — Encounter: Payer: Self-pay | Admitting: Gastroenterology

## 2021-06-21 ENCOUNTER — Ambulatory Visit: Payer: PPO | Admitting: Gastroenterology

## 2021-06-21 VITALS — BP 110/66 | HR 74 | Ht 63.0 in | Wt 145.2 lb

## 2021-06-21 DIAGNOSIS — K5730 Diverticulosis of large intestine without perforation or abscess without bleeding: Secondary | ICD-10-CM

## 2021-06-21 DIAGNOSIS — K5909 Other constipation: Secondary | ICD-10-CM | POA: Diagnosis not present

## 2021-06-21 NOTE — Patient Instructions (Signed)
If you are age 86 or older, your body mass index should be between 23-30. Your Body mass index is 25.72 kg/m. If this is out of the aforementioned range listed, please consider follow up with your Primary Care Provider.  If you are age 44 or younger, your body mass index should be between 19-25. Your Body mass index is 25.72 kg/m. If this is out of the aformentioned range listed, please consider follow up with your Primary Care Provider.   ________________________________________________________  The [facility] providers would like to encourage you to use [facility] to communicate with providers for non-urgent requests or questions.  Due to long hold times on the telephone, sending your provider a message by [facility] may be a faster and more efficient way to get a response.  Please allow 48 business hours for a response.  Please remember that this is for non-urgent requests.  _______________________________________________________  Please STOP the Linzess.   It was a pleasure to see you today!  Thank you for trusting me with your gastrointestinal care!

## 2021-06-21 NOTE — Progress Notes (Signed)
[facility] Progress Note  Chief Complaint: Chronic constipation  Subjective  History: Alexander [name] was last seen in office consultation July 2021 for chronic constipation.  Unfortunately, he and his wife were limited historians.  Patient had concerns about stool coming out of multiple openings in the anal area, exam was normal in the office that day. MRI pelvis performed to rule out some kind of mass or fistula, results as below.  After reviewing that and patient's previous barium enema result, I no longer felt the colonoscopy was warranted.  He was noted to have severe diverticulosis which seem to likely account for his constipation.  I recommended changing to MiraLAX.  He has been on Linzess daily without much improvement in constipation.  Alexander [name] was here with his wife today, who is clearly frustrated by his ongoing complaints and also general difficulties dealing with his dementia.  He is unable to give much helpful history, but says he does not have abdominal pain.  His wife Alexander [name] says that [name] believes he should have a bowel movement about the same time every afternoon, as that had been his pattern for many years.  If he does not do so, he is concerned about that and feels there must be something wrong.  She has lately given him some prune juice at the same time as the Linzess, then that seem to make the stool too loose.  She does not believe that the Linzess by itself seems to have helped him very much. Alexander [name] reportedly still has a good appetite and stable weight and has not had any rectal bleeding. (20 minutes late for today's appointment) ROS: Cardiovascular:  no chest pain Respiratory: no dyspnea  The patient's Past Medical, Family and Social History were reviewed and are on file in the EMR.  Objective:  Med list reviewed  Current Outpatient Medications:    albuterol (PROAIR HFA) 108 (90 Base) MCG/ACT inhaler, 2 puffs every 4 hours as needed only  if your can't catch your  breath (Patient taking differently: Inhale 2 puffs into the lungs every 4 (four) hours as needed for wheezing or shortness of breath. 2 puffs every 4 hours as needed only  if your can't catch your breath), Disp: 18 g, Rfl: 11   albuterol (PROVENTIL) (2.5 MG/3ML) 0.083% nebulizer solution, TAKE 3 MLS BY NEBULIZATION EVERY 6 HOURS AS NEEDED FOR WHEEZING OR SHORTNESS OF BREATH., Disp: 150 mL, Rfl: 3   budesonide-formoterol (SYMBICORT) 80-4.5 MCG/ACT inhaler, Inhale 2 puffs into the lungs 2 (two) times daily., Disp: , Rfl:    divalproex (DEPAKOTE SPRINKLE) 125 MG capsule, 1 capsule, Disp: , Rfl:    donepezil (ARICEPT) 5 MG tablet, Take 5 mg by mouth at bedtime., Disp: , Rfl:    fluticasone (FLONASE) 50 MCG/ACT nasal spray, Place 1 spray into both nostrils 2 (two) times daily., Disp: 16 g, Rfl: 0   lactulose (CONSTULOSE) 10 GM/15ML solution, Take 15 mLs (10 g total) by mouth daily as needed for mild constipation., Disp: 30 mL, Rfl: 0   linaclotide (LINZESS) 72 MCG capsule, Take 1 capsule (72 mcg total) by mouth daily before breakfast., Disp: 30 capsule, Rfl: 0   meclizine (ANTIVERT) 12.5 MG tablet, Take 1-2 tablets (12.5-25 mg total) by mouth 3 (three) times daily as needed for dizziness., Disp: 30 tablet, Rfl: 0   traZODone (DESYREL) 50 MG tablet, Take 1/2 tab po qd x 3 days, then increase to one tablet at hs, Disp: 30 tablet, Rfl: 1   triamcinolone cream (  KENALOG) 0.1 %, Apply 1 application topically 2 (two) times daily as needed (itching)., Disp: , Rfl:    Vital signs in last 24 hrs: Vitals:   06/21/21 0942  BP: 110/66  Pulse: 74   Wt Readings from Last 3 Encounters:  06/21/21 145 lb 3.2 oz (65.9 kg)  05/25/21 148 lb (67.1 kg)  05/16/21 145 lb (65.8 kg)    Physical Exam  Pleasant elderly man with dementia, gets on exam table without difficulty. HEENT: sclera anicteric, oral mucosa moist without lesions Cardiac: RRR without murmurs, S1S2 heard, no peripheral edema Pulm: clear to  auscultation bilaterally, normal RR and effort noted Abdomen: soft, no tenderness, with active bowel sounds. No guarding or palpable hepatosplenomegaly.   Labs:   ___________________________________________ Radiologic studies:  CLINICAL DATA:  Patient reports chronic constipation in stool coming out of 2 separate openings in the rectal area. Evaluate for fistula.   EXAM: MRI PELVIS WITHOUT AND WITH CONTRAST   TECHNIQUE: Multiplanar multisequence MR imaging of the pelvis was performed both before and after administration of intravenous contrast.   CONTRAST:  13mL GADAVIST GADOBUTROL 1 MMOL/ML IV SOLN   COMPARISON:  Barium enema 09/11/2019   FINDINGS: Study limited by motion artifact.   Urinary Tract: Bladder is mildly distended. No substantial bladder wall thickening. No focal bladder wall abnormality.   Bowel: No substantial stool volume in the visualized rectosigmoid junction and rectum. There is no perianal or perirectal fistula. No perianal abscess.   Vascular/Lymphatic: Unremarkable. No inguinal or lower pelvic sidewall lymphadenopathy.   Reproductive: Prostate gland is markedly enlarged and heterogeneous. Bilateral testicular cysts are evident, measuring up to 4.8 x 4.8 cm in the right testicle. Multiple left testicular cyst noted measuring up to 2.4 x 1.8 cm. Right testicular cyst shows internal septation. No enhancing soft tissue, mural nodularity, or signal heterogeneity in any of the cysts. Cystic lesions of not been completely included on all pulse sequences.   Other:  Trace free fluid is noted in the rectovesical pouch.   Musculoskeletal: No focal suspicious marrow enhancement within the visualized bony anatomy.   IMPRESSION: 1. No evidence for perianal fistula or abscess. 2. Bilateral cystic lesions in the testes. Dominant cyst is in the right testicle in shows internal septation. None of the cystic lesion show enhancing internal architecture,  mural thickening/irregularity, or mural nodularity. As such, these findings are likely benign. Consider urology consultation with scrotal ultrasound to further evaluate and establish baseline for potential follow-up, as warranted. A.     Electronically Signed   By: Kennith Center M.D.   On: 11/11/2019 08:39  ____________________________________________ Other:   _____________________________________________ Assessment & Plan  Assessment: Encounter Diagnoses  Name Primary?   Chronic constipation Yes   Diverticulosis of colon    His bowel habit has changed in recent years, but it does not appear to be pathological.  He has severe diverticulosis that is likely contributing, no mass seen on imaging.  It also seems that his dementia may be contributing to his perception of constipation.  I would not advocate a colonoscopy given the high risks and likely low benefit given his reassuring radiographic work-up. I also tried to explain to him that he may not have a bowel movement every day but it is okay.  Difficult to tell how much she has retained that information.  Linzess has not clearly been helping, so I recommended that Alexander [name] stop giving it to him.  She would like to try again with the prune juice, which is  fine since it is natural and often effective.  If it is insufficient to give him a bowel movement every other day, or if it causes too much bloating gas or loose stool, then I recommended she try about a half capful of MiraLAX in a glass of water once a day.  I reassured them and he is welcome to see me as needed.   Alexander [name]

## 2021-07-05 ENCOUNTER — Ambulatory Visit: Payer: PPO | Admitting: Internal Medicine

## 2021-07-12 ENCOUNTER — Telehealth: Payer: Self-pay | Admitting: Physician Assistant

## 2021-07-12 NOTE — Telephone Encounter (Signed)
Hallucinations, going thru drawers.  ?

## 2021-07-12 NOTE — Telephone Encounter (Signed)
Thank you :)

## 2021-07-12 NOTE — Telephone Encounter (Signed)
Left message for patient to call  at 4:09am  ?

## 2021-07-12 NOTE — Telephone Encounter (Signed)
Patients wife called and stated she would like a call back. ?

## 2021-07-13 ENCOUNTER — Other Ambulatory Visit: Payer: Self-pay | Admitting: Physician Assistant

## 2021-07-13 NOTE — Telephone Encounter (Signed)
Can you send in new Rx for Depakote 125mg  bid pharmacy is correct. I advised if symptoms worsen to go to ER  ?

## 2021-07-14 ENCOUNTER — Other Ambulatory Visit: Payer: Self-pay | Admitting: Physician Assistant

## 2021-07-14 ENCOUNTER — Telehealth: Payer: Self-pay

## 2021-07-14 NOTE — Telephone Encounter (Signed)
Can you send in new Rx Depakote bid now, daughter came by to get Rx sent in.  ?

## 2021-07-14 NOTE — Telephone Encounter (Signed)
Patient's daughter came by the office requesting to speak with Alexander [name] to review medication changes. ?

## 2021-07-17 NOTE — Progress Notes (Signed)
? ?Subjective:  ? ?Patient ID: Alexander [name], male    DOB: [birth year]    MRN: [id number] ? ? ?Brief patient profile:  ?88  yobm quit smoking 04/2000    gold III COPD   ? ? ?01/09/13  ?Cardiac evaluation - echo nml  ?Stress test - peak VO2 68%, limited by ventilation  ?c. cath Claiborne Billings) -11/10 - nml LV fn, nml coronaries  ?Spirometry >>FEV1 1.14 - 45% -moderate airway obstruction  ? ?  ?  ?05/04/2017 acute extended ov/[name] re:  COPD III  Really not clear what meds he's been taking nor is his wife sure ?Chief Complaint  ?Patient presents with  ? Acute Visit  ?  productive cough with clear,gray sputum,SOB w/ activity,feels it has improved with the inhaler he uses  ?daily cough x 3 months, worse in am but all day long and doe = MMRC3 = can't walk 100 yards even at a slow pace at a flat grade s stopping due to sob  - still shops at [facility] but can't get around without resting  ?Assoc rhinitis/ nasal congestion  ?Very confused again with meds  ?rec ?Symbicort 80  Take 2 puffs first thing in am and then another 2 puffs about 12 hours later.  ?Pantoprazole (protonix) 40 mg   Take  30-60 min before first meal of the day and Pepcid (famotidine)  20 mg one @  bedtime until return to office ?Prednisone 10 mg take  4 each am x 2 days,   2 each am x 2 days,  1 each am x 2 days and stop   ?Augmentin 875 mg take one pill twice daily  X 10 days - take at breakfast and supper with large glass of water.  ?GERD diet  ?Work on inhaler technique   ?Please schedule a follow up office visit in 2 weeks, sooner if needed with all medications / inhalers / solutions ?   ? ? ?03/30/2020  f/u ov/[name] re: GOLD III copd  ?Chief Complaint  ?Patient presents with  ? Follow-up  ?  shortness of breath with activity  ?Dyspnea: mb and back is fine / yard work is fine / can't really identify a specific activity that makes him sob  ?Cough: daytime tickle ? pnds "I feel like I have a hole in my nose"  ?Sleeping: fine flat  ?SABA use:  A couple times a day then  neb couple times a week  ?02: none  ?rec ?Plan A = Automatic = Always=    Symbicort 80 Take 2 puffs first thing in am and then another 2 puffs about 12 hours later.  ?Work on inhaler technique:   ?Plan B = Backup (to supplement plan A, not to replace it) ?Only use your albuterol inhaler as a rescue medication ?Plan C = Crisis (instead of Plan B but only if Plan B stops working) ?- only use your albuterol nebulizer if you first try Plan B and it fails to help ?Plan D = Deltasone  ?- if ABC not working to your satisfaction then Prednisone 10 mg take  4 each am x 2 days,   2 each am x 2 days,  1 each am x 2 days and stop  ?ENT evaluation would be the next stop but but I'll defer this to Dr Philip Aspen  ?Please schedule a follow up visit in 3 months but call sooner if needed  with all medications /inhalers/ solutions in hand so we can verify exactly what you are  taking. This includes all medications from all doctors and over the counters ? ? ?07/05/2020  f/u ov/[name] re:  Month since last prednisone  ?Chief Complaint  ?Patient presents with  ? Follow-up  ?  Cough up sputum and SOB  ?Dyspnea: still doing some yardwork  ?Cough: minimal thick white / always better p pred assoc with rhinitis ?Sleeping: bed is flat/ 2 pillows no resp issues or am cough/ congestion   ?02: none  ?Covid status:   vax x 2  And had covid  ?Rec ?Call Dr Victorio Palm office for follow up for the cough [phone]  ?No change in pulmonary medications ? ?88 year old with history of dementia, COPD, CAD, HTN, HLD, BPH, anxiety, GERD, depression admitted for shortness of breath.  His home AC has not been working there for the past week started having worsening shortness of breath.  COVID-19/PCR negative, troponins remain negative.  Chest x-ray was negative.  Diagnosed with COPD exacerbation and admitted to the hospital.  During the hospitalization course with bronchodilators, steroids and doxycycline his symptoms significantly improved and he was stable  for discharge.  He was ambulating okay without shortness of breath and saturating well as well.  Wife present at bedside during my evaluation on the day of discharge.  All the questions answered. ?  ?   ? ?01/05/2021  f/u ov/[name] re: GOLD III COPD    maint on symbicort 80  / very poor hfa  ?Chief Complaint  ?Patient presents with  ? Follow-up  ?  Pt states SOB, winded, and dizzy.  ? Dyspnea:  mb slt uphill to house x  50 ft  ?Cough: not a problem  ?Sleeping: flat bed/ 2 pillows  ?SABA use: not needing neb  ?02: none  ?Covid status:  vax x 2 and covid ?Rec ?Call Dr Victorio Palm office for follow up for the cough [phone]> did not do as of 07/18/2021  ?No change in pulmonary medications ?Please schedule a follow up visit in 6 months but call sooner if needed  -  must bring all medications to avoid confusion ? ? ?07/18/2021  f/u ov/[name] re: GOLD 3 copd  maint on symbicort  80  / did not bring meds / wife not observing when he does use them ?Chief Complaint  ?Patient presents with  ? Follow-up  ?  Coughing   ?Dyspnea:  mb and back fine  ?Cough: throat clearing  ?Sleeping: flat bed/ 2 pillows  ?SABA use: none  ?02: none  ?Covid status:   vax x 2 and infected  ? ? ?No obvious day to day or daytime variability or assoc excess/ purulent sputum or mucus plugs or hemoptysis or cp or chest tightness, subjective wheeze or overt sinus or hb symptoms.  ? ?Sleeping ok as above without nocturnal  or early am exacerbation  of respiratory  c/o's or need for noct saba. Also denies any obvious fluctuation of symptoms with weather or environmental changes or other aggravating or alleviating factors except as outlined above  ? ?No unusual exposure hx or h/o childhood pna/ asthma or knowledge of premature birth. ? ?Current Allergies, Complete Past Medical History, Past Surgical History, Family History, and Social History were reviewed in [employer] record. ? ?ROS  The following are not active complaints unless  bolded ?Hoarseness, sore throat, dysphagia, dental problems, itching, sneezing,  nasal congestion or discharge of excess mucus or purulent secretions, ear ache,   fever, chills, sweats, unintended wt loss or wt gain, classically  pleuritic or exertional cp,  orthopnea pnd or arm/hand swelling  or leg swelling, presyncope, palpitations, abdominal pain, anorexia, nausea, vomiting, diarrhea  or change in bowel habits or change in bladder habits, change in stools or change in urine, dysuria, hematuria,  rash, arthralgias, visual complaints, headache, numbness, weakness or ataxia or problems with walking or coordination,  change in mood or  memory. ?      ? ?Current Meds  ?Medication Sig  ? albuterol (PROAIR HFA) 108 (90 Base) MCG/ACT inhaler 2 puffs every 4 hours as needed only  if your can't catch your breath (Patient taking differently: Inhale 2 puffs into the lungs every 4 (four) hours as needed for wheezing or shortness of breath. 2 puffs every 4 hours as needed only  if your can't catch your breath)  ? albuterol (PROVENTIL) (2.5 MG/3ML) 0.083% nebulizer solution TAKE 3 MLS BY NEBULIZATION EVERY 6 HOURS AS NEEDED FOR WHEEZING OR SHORTNESS OF BREATH.  ? budesonide-formoterol (SYMBICORT) 80-4.5 MCG/ACT inhaler Inhale 2 puffs into the lungs 2 (two) times daily.  ? divalproex (DEPAKOTE SPRINKLE) 125 MG capsule Take 1 capsule by mouth in the morning and at bedtime.  ? donepezil (ARICEPT) 5 MG tablet Take 5 mg by mouth at bedtime.  ? fluticasone (FLONASE) 50 MCG/ACT nasal spray Place 1 spray into both nostrils 2 (two) times daily.  ? lactulose (CONSTULOSE) 10 GM/15ML solution Take 15 mLs (10 g total) by mouth daily as needed for mild constipation.  ? linaclotide (LINZESS) 72 MCG capsule Take 1 capsule (72 mcg total) by mouth daily before breakfast.  ? meclizine (ANTIVERT) 12.5 MG tablet Take 1-2 tablets (12.5-25 mg total) by mouth 3 (three) times daily as needed for dizziness.  ? traZODone (DESYREL) 50 MG tablet Take 1/2  tab po qd x 3 days, then increase to one tablet at hs  ? triamcinolone cream (KENALOG) 0.1 % Apply 1 application topically 2 (two) times daily as needed (itching).  ?    ?  ? ?  ?  ?  ?  ?       ?  ?  ?  ?

## 2021-07-18 ENCOUNTER — Encounter: Payer: Self-pay | Admitting: Internal Medicine

## 2021-07-18 ENCOUNTER — Other Ambulatory Visit: Payer: Self-pay

## 2021-07-18 ENCOUNTER — Ambulatory Visit (INDEPENDENT_AMBULATORY_CARE_PROVIDER_SITE_OTHER): Payer: PPO | Admitting: Internal Medicine

## 2021-07-18 DIAGNOSIS — J449 Chronic obstructive pulmonary disease, unspecified: Secondary | ICD-10-CM

## 2021-07-18 DIAGNOSIS — R058 Other specified cough: Secondary | ICD-10-CM | POA: Diagnosis not present

## 2021-07-18 NOTE — Assessment & Plan Note (Addendum)
Quit smoking 2002  ?Spirometry 09/27/12  FEV1  1.06 (44%) ratio 47  ?- Spirometry 05/04/2017  FEV1 0.32 (16%)  Ratio 81 but f/v not physiologic   ?- 05/04/2017    > try symb 80 2bid as cough is the primary concern   ?- PFT's  07/17/2017  FEV1 1.17 (64 % ) ratio 64  p 20 % improvement from saba p nothing prior to study with DLCO  54 % corrects to 72  % for alv volume   ?- 09/12/2018 added pred as plan D ?- 05/26/2019  After extensive coaching inhaler device,  effectiveness =  75% > try breztri 2bid and start back on gerd rx  ?- 03/30/2020 back on symbicort 80 2bid with good control  ?-  03/30/2020   Walked RA  3 laps @ approx 250ft each @ moderately fast pace  stopped due to end of study, no sob, sats at end 97%   ?- 07/18/2021  After extensive coaching inhaler device,  effectiveness =    75%  ? ?Again advised pt's wife she needs to observe him using his symbicort correctly and consistently as he is actually still coachable as noted above > continue symbicort 80 2bid (dose limited by UACS - see avs for instructions unique to this ov) ?  ?

## 2021-07-18 NOTE — Assessment & Plan Note (Signed)
Allergy profile 05/21/2017 >  Eos 0.2 /  IgE  15 RAST neg ?-  Sinus CT 05/29/2017 1. Bilateral maxillary sinusitis with fluid levels. ?2. Chronic left sphenoid sinusitis with mild mucosal thickening > f/u [facility]: ov 07/09/17 c/w persistent sinusitis > rec FESS> pt canceled  ?- repeat Sinus CT 02/14/2018 > Bilateral maxillary sinus disease seen on the most recent CT scan has markedly improved. There is mild residual mucosal thickening in the inferior maxillary sinuses bilaterally. ?Mild mucosal thickening in the sphenoid sinuses is greater on the ?left and has increased since the prior study. ?-  07/05/2020 rhinitis/ cough responsive to pred > refer to [facility] who prev rec FESS  > referred back 01/05/2021 > has not seen as of 07/18/2021 > advised to do  ? ?Also advised on use of hard rock candy.  Note does not appear to be on ppi at this point but hard to keep track what he's on as he and wife consistently inconsistent with process of med reconciliation so should return with all meds in hand using a trust but verify approach to confirm accurate Medication  Reconciliation The principal here is that until we are certain that the  patients are doing what we've asked, it makes no sense to ask them to do more.  ? ? ?    ?  ? ?Each maintenance medication was reviewed in detail including emphasizing most importantly the difference between maintenance and prns and under what circumstances the prns are to be triggered using an action plan format where appropriate. ? ?Total time for H and P, chart review, counseling pt and wife, reviewing hfa device(s) and generating customized AVS unique to this office visit / same day charting = 30 min  ?     ?

## 2021-07-18 NOTE — Patient Instructions (Signed)
If not happy about the throat clearing problem please see Dr Wilburn Cornelia  ? ?Work on inhaler technique:  relax and gently blow all the way out then take a nice smooth full deep breath back in, triggering the inhaler at same time you start breathing in.  Hold for up to 5 seconds if you can. Blow out thru nose. Rinse and gargle with water when done.  If mouth or throat bother you at all,  try brushing teeth/gums/tongue with arm and hammer toothpaste/ make a slurry and gargle and spit out.  ? ? ?Please schedule a follow up visit in 6  months but call sooner if needed - bring all medications ?

## 2021-07-25 DIAGNOSIS — I10 Essential (primary) hypertension: Secondary | ICD-10-CM | POA: Diagnosis not present

## 2021-07-25 DIAGNOSIS — Z125 Encounter for screening for malignant neoplasm of prostate: Secondary | ICD-10-CM | POA: Diagnosis not present

## 2021-07-25 DIAGNOSIS — E785 Hyperlipidemia, unspecified: Secondary | ICD-10-CM | POA: Diagnosis not present

## 2021-08-03 ENCOUNTER — Telehealth: Payer: Self-pay | Admitting: Gastroenterology

## 2021-08-03 NOTE — Telephone Encounter (Signed)
Inbound call from patient wife. Reports patient is constipated and not having any bowel movements. States she gives him metamucil but it is not helping  ?

## 2021-08-03 NOTE — Telephone Encounter (Signed)
Returned call to patient's wife at listed number and they said that I have the wrong number. I called the patient's listed phone number and was able to speak with his wife. She reports that her phone number did change, it is now [phone]. Pt's wife reports that pt is only passing "rat balls" and that was yesterday. Pt's wife has been given pt 1 TBSP of Metamucil in water daily and 1/2 cup of prune juice daily. I told pt that per the last office note Dr. Loletha Carrow recommended:  She would like to try again with the prune juice, which is fine since it is natural and often effective.  If it is insufficient to give him a bowel movement every other day, or if it causes too much bloating gas or loose stool, then I recommended she try about a half capful of MiraLAX in a glass of water once a day. I have advised pt's wife to start giving him the pt 1/2 capful of Miralax daily in a cup of water daily. I told her to make sure pt is drinking plenty of water, she says that he does not. She states that he has dementia and gets aggravated which upsets her. She states that it is very hard. She will try the Miralax and knows to call back if no improvement.  ?

## 2021-08-13 ENCOUNTER — Other Ambulatory Visit: Payer: Self-pay | Admitting: Neurology

## 2021-08-16 ENCOUNTER — Telehealth: Payer: Self-pay | Admitting: Physician Assistant

## 2021-08-16 NOTE — Telephone Encounter (Signed)
Tisha from [facility] called with concerns about a telephone call she received from the patient's daughter.  ? ?She also expressed some concerns about the patient's medications and wants to review them with a clinical staff member. ? ?Judie Petit said the patient hasn't slept all night and is having a hard time today. ? ?Judie Petit will be available until 1:00 today for call back and also recommended a call to the patient. ? ?

## 2021-08-16 NOTE — Telephone Encounter (Signed)
Left message to call office

## 2021-08-16 NOTE — Telephone Encounter (Signed)
Tisha called back from Dr Tomie China office.  She stated there has been some medication changes, she wanted to follow up with someone. ?

## 2021-08-16 NOTE — Telephone Encounter (Signed)
Spoke with wife, and caretaker, they will be changing physicians, PCP changed her meds. Will call Dr.Patterson office again, message left x 2 ? ?

## 2021-08-24 DIAGNOSIS — R972 Elevated prostate specific antigen [PSA]: Secondary | ICD-10-CM | POA: Diagnosis not present

## 2021-08-24 DIAGNOSIS — N401 Enlarged prostate with lower urinary tract symptoms: Secondary | ICD-10-CM | POA: Diagnosis not present

## 2021-08-24 DIAGNOSIS — R351 Nocturia: Secondary | ICD-10-CM | POA: Diagnosis not present

## 2021-09-15 DIAGNOSIS — H9193 Unspecified hearing loss, bilateral: Secondary | ICD-10-CM | POA: Diagnosis not present

## 2021-09-15 DIAGNOSIS — J449 Chronic obstructive pulmonary disease, unspecified: Secondary | ICD-10-CM | POA: Diagnosis not present

## 2021-09-15 DIAGNOSIS — R2681 Unsteadiness on feet: Secondary | ICD-10-CM | POA: Diagnosis not present

## 2021-09-15 DIAGNOSIS — G309 Alzheimer's disease, unspecified: Secondary | ICD-10-CM | POA: Diagnosis not present

## 2021-09-26 ENCOUNTER — Telehealth: Payer: Self-pay | Admitting: Gastroenterology

## 2021-09-26 ENCOUNTER — Ambulatory Visit: Payer: PPO | Admitting: Physician Assistant

## 2021-09-26 NOTE — Telephone Encounter (Signed)
Inbound call from patients wife stating that patient is still having stomach issues with constipation even with taking miralax. Patient was scheduled to see Dr. Myrtie Neither on 7/11 at 1:20 but wife is seeking advise on what they can do to help in the meantime. Please advise.

## 2021-09-26 NOTE — Telephone Encounter (Signed)
Attempted to reach pt's wife at her listed #, the line rings and then goes to a fast busy signal. I am unable to leave a message at this time.   I called the home phone. Pt's daughter answers and she states that she will have her mother call us back once she returns.

## 2021-09-27 NOTE — Telephone Encounter (Signed)
2nd attempt to return call to patient's wife. Her phone line rings then goes straight to a fast busy signal. No option to leave a vm.  I called the home phone again. Pt's daughter answers, she stated that Pt's wife stepped out for a minute and she will have her call us back.

## 2021-09-28 DIAGNOSIS — H02005 Unspecified entropion of left lower eyelid: Secondary | ICD-10-CM | POA: Diagnosis not present

## 2021-09-28 NOTE — Telephone Encounter (Addendum)
No return call received. Will await further communication from patient or his wife.  Pt's appt also has been moved to 1:40 pm on 11/01/21. Dr. Myrtie Neither has a meeting this day. Appt information mailed to patient.

## 2021-10-11 DIAGNOSIS — Z636 Dependent relative needing care at home: Secondary | ICD-10-CM | POA: Diagnosis not present

## 2021-10-11 DIAGNOSIS — F02818 Dementia in other diseases classified elsewhere, unspecified severity, with other behavioral disturbance: Secondary | ICD-10-CM | POA: Diagnosis not present

## 2021-10-11 DIAGNOSIS — G301 Alzheimer's disease with late onset: Secondary | ICD-10-CM | POA: Diagnosis not present

## 2021-10-11 DIAGNOSIS — F02B11 Dementia in other diseases classified elsewhere, moderate, with agitation: Secondary | ICD-10-CM | POA: Diagnosis not present

## 2021-10-11 DIAGNOSIS — F02811 Dementia in other diseases classified elsewhere, unspecified severity, with agitation: Secondary | ICD-10-CM | POA: Diagnosis not present

## 2021-10-11 DIAGNOSIS — F0282 Dementia in other diseases classified elsewhere, unspecified severity, with psychotic disturbance: Secondary | ICD-10-CM | POA: Diagnosis not present

## 2021-10-12 DIAGNOSIS — H02035 Senile entropion of left lower eyelid: Secondary | ICD-10-CM | POA: Diagnosis not present

## 2021-11-01 ENCOUNTER — Ambulatory Visit (INDEPENDENT_AMBULATORY_CARE_PROVIDER_SITE_OTHER): Payer: PPO | Admitting: Gastroenterology

## 2021-11-01 ENCOUNTER — Encounter: Payer: Self-pay | Admitting: Gastroenterology

## 2021-11-01 VITALS — BP 112/62 | HR 87 | Ht 63.0 in | Wt 145.6 lb

## 2021-11-01 DIAGNOSIS — K5909 Other constipation: Secondary | ICD-10-CM

## 2021-11-01 NOTE — Progress Notes (Signed)
[facility] Progress Note  Chief Complaint: Chronic constipation  Subjective  History: Alexander [name] was seen in follow-up today for constipation.  I saw him February 28, and my note outlines he has difficulty.  He has had a reassuring work-up of abdominal imaging, does not have severe diverticulosis.  It is also not clear how much difficulty he has moving his bowels versus a perception of constipation related to dementia.  I felt that colonoscopy was greater risk for him.  His wife Alexander [name] tells me things have been going well lately on a regimen of MiraLAX, prune juice and coffee every morning.  [name] has a BM nearly every day and does not seem to complain as much about his stomach.  He is still somewhat ruminative about the need for BM daily, and I have again reassured him that it is okay if he were to have the occasional day without a bowel movement. His appetite is reportedly good and his weight stable. 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 ROS: Cardiovascular:  no chest pain Respiratory: no dyspnea  The patient's Past Medical, Family and Social History were reviewed and are on file in the EMR.  Objective:  Med list reviewed  Current Outpatient Medications:    albuterol (PROAIR HFA) 108 (90 Base) MCG/ACT inhaler, 2 puffs every 4 hours as needed only  if your can't catch your breath (Patient taking differently: Inhale 2 puffs into the lungs every 4 (four) hours as needed for wheezing or shortness of breath. 2 puffs every 4 hours as needed only  if your can't catch your breath), Disp: 18 g, Rfl: 11   albuterol (PROVENTIL) (2.5 MG/3ML) 0.083% nebulizer solution, TAKE 3 MLS BY NEBULIZATION EVERY 6 HOURS AS NEEDED FOR WHEEZING OR SHORTNESS OF BREATH., Disp: 150 mL, Rfl: 3   budesonide-formoterol (SYMBICORT) 80-4.5 MCG/ACT inhaler, Inhale 2 puffs into the lungs 2 (two) times daily., Disp: , Rfl:    polyethylene glycol (MIRALAX /  GLYCOLAX) 17 g packet, Take 17 g by mouth daily., Disp: , Rfl:    triamcinolone cream (KENALOG) 0.1 %, Apply 1 application topically 2 (two) times daily as needed (itching)., Disp: , Rfl:    divalproex (DEPAKOTE SPRINKLE) 125 MG capsule, Take 1 capsule by mouth in the morning and at bedtime. (Patient not taking: Reported on 11/01/2021), Disp: , Rfl:    donepezil (ARICEPT) 5 MG tablet, Take 5 mg by mouth at bedtime. (Patient not taking: Reported on 11/01/2021), Disp: , Rfl:    fluticasone (FLONASE) 50 MCG/ACT nasal spray, Place 1 spray into both nostrils 2 (two) times daily. (Patient not taking: Reported on 11/01/2021), Disp: 16 g, Rfl: 0   lactulose (CONSTULOSE) 10 GM/15ML solution, Take 15 mLs (10 g total) by mouth daily as needed for mild constipation. (Patient not taking: Reported on 11/01/2021), Disp: 30 mL, Rfl: 0   linaclotide (LINZESS) 72 MCG capsule, Take 1 capsule (72 mcg total) by mouth daily before breakfast. (Patient not taking: Reported on 11/01/2021), Disp: 30 capsule, Rfl: 0   meclizine (ANTIVERT) 12.5 MG tablet, Take 1-2 tablets (12.5-25 mg total) by mouth 3 (three) times daily as needed for dizziness. (Patient not taking: Reported on 11/01/2021), Disp: 30 tablet, Rfl: 0   traZODone (DESYREL) 50 MG tablet, TAKE ONE TABLET AT BEDTIME (Patient not taking: Reported on 11/01/2021), Disp: 30 tablet, Rfl: 1   Vital signs in last 24 hrs: Vitals:   11/01/21 1353  BP:  112/62  Pulse: 87  SpO2: 94%   Wt Readings from Last 3 Encounters:  11/01/21 145 lb 9.6 oz (66 kg)  07/18/21 148 lb 6.4 oz (67.3 kg)  06/21/21 145 lb 3.2 oz (65.9 kg)    Physical Exam  He is well-appearing  Cardiac: Regular without murmur,  no peripheral edema Pulm: clear to auscultation bilaterally, normal RR and effort noted Abdomen: soft, no tenderness, with active bowel sounds. No guarding or palpable hepatosplenomegaly.  Labs:   ___________________________________________ Radiologic  studies:   ____________________________________________ Other:   _____________________________________________ Assessment & Plan  Assessment: Encounter Diagnosis  Name Primary?   Chronic constipation Yes   Stable on current regimen, no additional testing planned.  I encouraged Alexander [name] to contact us by phone or portal message as needed for additional advice.    Alexander [name]

## 2021-11-01 NOTE — Patient Instructions (Signed)
If you are age 86 or older, your body mass index should be between 23-30. Your Body mass index is 25.79 kg/m. If this is out of the aforementioned range listed, please consider follow up with your Primary Care Provider.  If you are age 51 or younger, your body mass index should be between 19-25. Your Body mass index is 25.79 kg/m. If this is out of the aformentioned range listed, please consider follow up with your Primary Care Provider.   ________________________________________________________  The [facility] providers would like to encourage you to use [facility] to communicate with providers for non-urgent requests or questions.  Due to long hold times on the telephone, sending your provider a message by [facility] may be a faster and more efficient way to get a response.  Please allow 48 business hours for a response.  Please remember that this is for non-urgent requests.  _______________________________________________________  It was a pleasure to see you today!  Thank you for trusting me with your gastrointestinal care!

## 2021-11-11 DIAGNOSIS — M26622 Arthralgia of left temporomandibular joint: Secondary | ICD-10-CM | POA: Diagnosis not present

## 2021-11-11 DIAGNOSIS — H9202 Otalgia, left ear: Secondary | ICD-10-CM | POA: Diagnosis not present

## 2021-11-14 ENCOUNTER — Ambulatory Visit: Payer: PPO | Admitting: Physician Assistant

## 2021-12-09 DIAGNOSIS — H9193 Unspecified hearing loss, bilateral: Secondary | ICD-10-CM | POA: Diagnosis not present

## 2021-12-09 DIAGNOSIS — E663 Overweight: Secondary | ICD-10-CM | POA: Diagnosis not present

## 2021-12-09 DIAGNOSIS — F319 Bipolar disorder, unspecified: Secondary | ICD-10-CM | POA: Diagnosis not present

## 2021-12-09 DIAGNOSIS — I10 Essential (primary) hypertension: Secondary | ICD-10-CM | POA: Diagnosis not present

## 2021-12-09 DIAGNOSIS — J449 Chronic obstructive pulmonary disease, unspecified: Secondary | ICD-10-CM | POA: Diagnosis not present

## 2021-12-09 DIAGNOSIS — F419 Anxiety disorder, unspecified: Secondary | ICD-10-CM | POA: Diagnosis not present

## 2021-12-09 DIAGNOSIS — I2510 Atherosclerotic heart disease of native coronary artery without angina pectoris: Secondary | ICD-10-CM | POA: Diagnosis not present

## 2021-12-09 DIAGNOSIS — E441 Mild protein-calorie malnutrition: Secondary | ICD-10-CM | POA: Diagnosis not present

## 2021-12-09 DIAGNOSIS — I509 Heart failure, unspecified: Secondary | ICD-10-CM | POA: Diagnosis not present

## 2021-12-09 DIAGNOSIS — I110 Hypertensive heart disease with heart failure: Secondary | ICD-10-CM | POA: Diagnosis not present

## 2021-12-09 DIAGNOSIS — E785 Hyperlipidemia, unspecified: Secondary | ICD-10-CM | POA: Diagnosis not present

## 2021-12-09 DIAGNOSIS — F03B18 Unspecified dementia, moderate, with other behavioral disturbance: Secondary | ICD-10-CM | POA: Diagnosis not present

## 2021-12-13 ENCOUNTER — Other Ambulatory Visit: Payer: Self-pay | Admitting: Physician Assistant

## 2021-12-28 DIAGNOSIS — N401 Enlarged prostate with lower urinary tract symptoms: Secondary | ICD-10-CM | POA: Diagnosis not present

## 2021-12-28 DIAGNOSIS — R351 Nocturia: Secondary | ICD-10-CM | POA: Diagnosis not present

## 2022-01-20 DIAGNOSIS — F02B11 Dementia in other diseases classified elsewhere, moderate, with agitation: Secondary | ICD-10-CM | POA: Diagnosis not present

## 2022-01-20 DIAGNOSIS — G301 Alzheimer's disease with late onset: Secondary | ICD-10-CM | POA: Diagnosis not present

## 2022-01-22 NOTE — Progress Notes (Unsigned)
Subjective:   Patient ID: Alexander [name], male    DOB: July 31, 1932    MRN: [id number]   Brief patient profile:  41  yobm quit smoking 04/2000    gold III COPD     01/09/13  Cardiac evaluation - echo nml  Stress test - peak VO2 68%, limited by ventilation  c. cath Claiborne Billings) -11/10 - nml LV fn, nml coronaries  Spirometry >>FEV1 1.14 - 45% -moderate airway obstruction    03/30/2020  f/u ov/Roshun Klingensmith re: GOLD III copd  Chief Complaint  Patient presents with   Follow-up    shortness of breath with activity  Dyspnea: mb and back is fine / yard work is fine / can't really identify a specific activity that makes him sob  Cough: daytime tickle ? pnds "I feel like I have a hole in my nose"  Sleeping: fine flat  SABA use:  A couple times a day then neb couple times a week  02: none  rec Plan A = Automatic = Always=    Symbicort 80 Take 2 puffs first thing in am and then another 2 puffs about 12 hours later.  Work on inhaler technique:   Plan B = Backup (to supplement plan A, not to replace it) Only use your albuterol inhaler as a rescue medication Plan C = Crisis (instead of Plan B but only if Plan B stops working) - only use your albuterol nebulizer if you first try Plan B and it fails to help Plan D = Deltasone  - if ABC not working to your satisfaction then Prednisone 10 mg take  4 each am x 2 days,   2 each am x 2 days,  1 each am x 2 days and stop  ENT evaluation would be the next stop but but I'll defer this to Dr Philip Aspen  Please schedule a follow up visit in 3 months but call sooner if needed  with all medications /inhalers/ solutions in hand so we can verify exactly what you are taking. This includes all medications from all doctors and over the counters   07/18/2021  f/u ov/Danyle Boening re: GOLD 3 copd  maint on symbicort  80  / did not bring meds / wife not observing when he does use them Chief Complaint  Patient presents with   Follow-up    Coughing   Dyspnea:  mb and back fine   Cough: throat clearing  Sleeping: flat bed/ 2 pillows  SABA use: none  02: none  Covid status:   vax x 2 and infected  Rec If not happy about the throat clearing problem please see Dr Wilburn Cornelia  Work on inhaler technique:  Please schedule a follow up visit in 6  months but call sooner if needed - bring all medications    01/23/2022  f/u ov/Melynda Krzywicki re: GOLD 3 copd  maint on symb 80 brought all meds Chief Complaint  Patient presents with   Follow-up    Breathing is overall doing well.  He has the sensation of mucus in his throat, clears throat often.    Dyspnea:  mb and back ok  Cough: just the throat clearing now,no excess mucus, has not had ent f/u as rc  Sleeping: flat bed 3 pillows but does fine SABA use: none  02: none     No obvious day to day or daytime variability or assoc excess/ purulent sputum or mucus plugs or hemoptysis or cp or chest tightness, subjective wheeze or overt sinus or  hb symptoms.   Sleeping  without nocturnal  or early am exacerbation  of respiratory  c/o's or need for noct saba. Also denies any obvious fluctuation of symptoms with weather or environmental changes or other aggravating or alleviating factors except as outlined above   No unusual exposure hx or h/o childhood pna/ asthma or knowledge of premature birth.  Current Allergies, Complete Past Medical History, Past Surgical History, Family History, and Social History were reviewed in [employer] record.  ROS  The following are not active complaints unless bolded Hoarseness, sore throat= globus sensation , dysphagia, dental problems, itching, sneezing,  nasal congestion or discharge of excess mucus or purulent secretions, ear ache,   fever, chills, sweats, unintended wt loss or wt gain, classically pleuritic or exertional cp,  orthopnea pnd or arm/hand swelling  or leg swelling, presyncope, palpitations, abdominal pain, anorexia, nausea, vomiting, diarrhea  or change in bowel habits  or change in bladder habits, change in stools or change in urine, dysuria, hematuria,  rash, arthralgias, visual complaints, headache, numbness, weakness or ataxia or problems with walking or coordination,  change in mood or  memory.        Current Meds  Medication Sig   albuterol (PROVENTIL) (2.5 MG/3ML) 0.083% nebulizer solution TAKE 3 MLS BY NEBULIZATION EVERY 6 HOURS AS NEEDED FOR WHEEZING OR SHORTNESS OF BREATH.   alfuzosin (UROXATRAL) 10 MG 24 hr tablet Take 10 mg by mouth daily with breakfast.   budesonide-formoterol (SYMBICORT) 80-4.5 MCG/ACT inhaler Inhale 2 puffs into the lungs 2 (two) times daily.   escitalopram (LEXAPRO) 10 MG tablet Take 10 mg by mouth daily.   risperiDONE (RISPERDAL) 0.5 MG tablet 2 at bedtime and 1 daily as needed            Objective:   Physical Exam  Wts  01/23/2022       144  07/18/2021      148  01/05/2021      144  07/05/2020      144 03/30/2020      141 07/08/2019      155  05/26/2019        159  03/11/2019    152 12/09/2018      154   09/12/2018     158 03/15/2018    165  02/01/2018    161  01/04/2018      166  10/18/2017      166 07/17/2017       173   06/04/2017      176  05/21/2017       175  05/04/2017       173   03/17/2016    182   02/03/14 177 lb (80.287 kg)  01/27/14 177 lb 12 oz (80.627 kg)  01/06/14 173 lb 8 oz (78.699 kg)    Vital signs reviewed  01/23/2022  - Note at rest 02 sats  97% on RA   General appearance:    pleasant amb bm nad / occ throat clearing    HEENT : Oropharynx  clear/no teeth   Nasal turbinates nl    NECK :  without  apparent JVD/ palpable Nodes/TM    LUNGS: no acc muscle use,  Min barrel  contour chest wall with bilateral  slightly decreased bs s audible wheeze and  without cough on insp or exp maneuvers and min  Hyperresonant  to  percussion bilaterally    CV:  RRR  no s3 or murmur or increase in  P2, and no edema   ABD:  soft and nontender with pos end  insp Hoover's  in the supine position.  No bruits or  organomegaly appreciated   MS:  Nl gait/ ext warm without deformities Or obvious joint restrictions  calf tenderness, cyanosis or clubbing     SKIN: warm and dry without lesions    NEURO:  alert, approp, nl sensorium with  no motor or cerebellar deficits apparent.                    Assessment & Plan:

## 2022-01-23 ENCOUNTER — Encounter: Payer: Self-pay | Admitting: Internal Medicine

## 2022-01-23 ENCOUNTER — Ambulatory Visit: Payer: PPO | Admitting: Internal Medicine

## 2022-01-23 VITALS — BP 102/58 | HR 80 | Temp 98.1°F | Ht 63.0 in | Wt 144.8 lb

## 2022-01-23 DIAGNOSIS — R058 Other specified cough: Secondary | ICD-10-CM

## 2022-01-23 DIAGNOSIS — J449 Chronic obstructive pulmonary disease, unspecified: Secondary | ICD-10-CM | POA: Diagnosis not present

## 2022-01-23 DIAGNOSIS — Z23 Encounter for immunization: Secondary | ICD-10-CM

## 2022-01-23 MED ORDER — BUDESONIDE-FORMOTEROL FUMARATE 80-4.5 MCG/ACT IN AERO: 2.0000 | INHALATION_SPRAY | Freq: Two times a day (BID) | RESPIRATORY_TRACT | 11 refills | Status: DC

## 2022-01-23 MED ORDER — ALBUTEROL SULFATE HFA 108 (90 BASE) MCG/ACT IN AERS: INHALATION_SPRAY | RESPIRATORY_TRACT | 2 refills | Status: DC

## 2022-01-23 NOTE — Assessment & Plan Note (Addendum)
Quit smoking 2002  Spirometry 09/27/12  FEV1  1.06 (44%) ratio 47  - Spirometry 05/04/2017  FEV1 0.32 (16%)  Ratio 81 but f/v not physiologic   - 05/04/2017    > try symb 80 2bid as cough is the primary concern   - PFT's  07/17/2017  FEV1 1.17 (64 % ) ratio 64  p 20 % improvement from saba p nothing prior to study with DLCO  54 % corrects to 72  % for alv volume   - 09/12/2018 added pred as plan D - 05/26/2019  After extensive coaching inhaler device,  effectiveness =  75% > try breztri 2bid and start back on gerd rx  - 03/30/2020 back on symbicort 80 2bid with good control  -  03/30/2020   Walked RA  3 laps @ approx 251ft each @ moderately fast pace  stopped due to end of study, no sob, sats at end 97%   - 01/23/2022  After extensive coaching inhaler device,  effectiveness =  50% (good insp but struggles with timing of trigger)   Doing better with symbiocort 80 despite suboptimal hfa technique with improving activity tolerance and no flares.    f/u can be q 6  m, sooner if needed

## 2022-01-23 NOTE — Patient Instructions (Addendum)
Call Dr Victorio Palm office for appt for your throat  [phone]    Work on inhaler technique:  relax and gently blow all the way out then take a nice smooth full deep breath back in, triggering the inhaler at same time you start breathing in.  Hold breath in for at least  5 seconds if you can. Blow out symbicort out thru nose. Rinse and gargle with water when done.  If mouth or throat bother you at all,  try brushing teeth/gums/tongue with arm and hammer toothpaste/ make a slurry and gargle and spit out.   Please schedule a follow up visit in 6 months but call sooner if needed

## 2022-01-23 NOTE — Assessment & Plan Note (Addendum)
Allergy profile 05/21/2017 >  Eos 0.2 /  IgE  15 RAST neg -  Sinus CT 05/29/2017 1. Bilateral maxillary sinusitis with fluid levels. 2. Chronic left sphenoid sinusitis with mild mucosal thickening > f/u [facility]: ov 07/09/17 c/w persistent sinusitis > rec FESS> pt canceled  - repeat Sinus CT 02/14/2018 > Bilateral maxillary sinus disease seen on the most recent CT scan has markedly improved. There is mild residual mucosal thickening in the inferior maxillary sinuses bilaterally. Mild mucosal thickening in the sphenoid sinuses is greater on the left and has increased since the prior study. -  07/05/2020 rhinitis/ cough responsive to pred > refer to [facility] who prev rec FESS  > referred back 01/05/2021 > has not seen as of 07/18/2021   Exam nl/ advised again to see ENT  01/23/2022          Each maintenance medication was reviewed in detail including emphasizing most importantly the difference between maintenance and prns and under what circumstances the prns are to be triggered using an action plan format where appropriate.  Total time for H and P, chart review, counseling, reviewing hfa device(s) and generating customized AVS unique to this office visit / same day charting = 43min

## 2022-01-24 ENCOUNTER — Encounter: Payer: Self-pay | Admitting: Internal Medicine

## 2022-02-16 DIAGNOSIS — I700 Atherosclerosis of aorta: Secondary | ICD-10-CM | POA: Diagnosis not present

## 2022-02-16 DIAGNOSIS — E441 Mild protein-calorie malnutrition: Secondary | ICD-10-CM | POA: Diagnosis not present

## 2022-02-16 DIAGNOSIS — F02B2 Dementia in other diseases classified elsewhere, moderate, with psychotic disturbance: Secondary | ICD-10-CM | POA: Diagnosis not present

## 2022-02-16 DIAGNOSIS — Z23 Encounter for immunization: Secondary | ICD-10-CM | POA: Diagnosis not present

## 2022-02-16 DIAGNOSIS — G301 Alzheimer's disease with late onset: Secondary | ICD-10-CM | POA: Diagnosis not present

## 2022-02-16 DIAGNOSIS — I10 Essential (primary) hypertension: Secondary | ICD-10-CM | POA: Diagnosis not present

## 2022-02-16 DIAGNOSIS — H9193 Unspecified hearing loss, bilateral: Secondary | ICD-10-CM | POA: Diagnosis not present

## 2022-02-16 DIAGNOSIS — R2681 Unsteadiness on feet: Secondary | ICD-10-CM | POA: Diagnosis not present
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
## 2022-02-23 ENCOUNTER — Encounter: Payer: Self-pay | Admitting: Student

## 2022-02-23 ENCOUNTER — Ambulatory Visit: Payer: PPO | Attending: Student | Admitting: Student

## 2022-02-23 VITALS — BP 116/60 | HR 74 | Ht 65.0 in | Wt 151.4 lb

## 2022-02-23 DIAGNOSIS — E785 Hyperlipidemia, unspecified: Secondary | ICD-10-CM

## 2022-02-23 DIAGNOSIS — J449 Chronic obstructive pulmonary disease, unspecified: Secondary | ICD-10-CM

## 2022-02-23 DIAGNOSIS — R0609 Other forms of dyspnea: Secondary | ICD-10-CM

## 2022-02-23 DIAGNOSIS — R42 Dizziness and giddiness: Secondary | ICD-10-CM | POA: Diagnosis not present

## 2022-02-23 DIAGNOSIS — I10 Essential (primary) hypertension: Secondary | ICD-10-CM

## 2022-02-23 NOTE — Patient Instructions (Signed)
Medication Instructions:  Your physician recommends that you continue on your current medications as directed. Please refer to the Current Medication list given to you today.  *If you need a refill on your cardiac medications before your next appointment, please call your pharmacy*  Lab Work: Your physician recommends that you return for lab work TODAY:  BMP BNP  If you have labs (blood work) drawn today and your tests are completely normal, you will receive your results only by: [place] (if you have MyChart) OR A paper copy in the mail If you have any lab test that is abnormal or we need to change your treatment, we will call you to review the results.  Testing/Procedures: NONE ordered at this time of appointment   Follow-Up: At [facility], you and your health needs are our priority.  As part of our continuing mission to provide you with exceptional heart care, we have created designated Provider Care Teams.  These Care Teams include your primary Cardiologist (physician) and Advanced Practice Providers (APPs -  Physician Assistants and Nurse Practitioners) who all work together to provide you with the care you need, when you need it.  Your next appointment:   6 month(s)  The format for your next appointment:   In Person  Provider:   Shelva Majestic, MD     Other Instructions  Important Information About Sugar

## 2022-02-24 ENCOUNTER — Encounter: Payer: Self-pay | Admitting: Student

## 2022-03-10 DIAGNOSIS — F03C11 Unspecified dementia, severe, with agitation: Secondary | ICD-10-CM | POA: Diagnosis not present

## 2022-04-23 DIAGNOSIS — K529 Noninfective gastroenteritis and colitis, unspecified: Secondary | ICD-10-CM | POA: Diagnosis not present

## 2022-04-23 DIAGNOSIS — R079 Chest pain, unspecified: Secondary | ICD-10-CM | POA: Diagnosis not present

## 2022-04-24 DIAGNOSIS — R079 Chest pain, unspecified: Secondary | ICD-10-CM | POA: Diagnosis not present

## 2022-05-11 DIAGNOSIS — F5104 Psychophysiologic insomnia: Secondary | ICD-10-CM | POA: Diagnosis not present

## 2022-05-11 DIAGNOSIS — H9193 Unspecified hearing loss, bilateral: Secondary | ICD-10-CM | POA: Diagnosis not present

## 2022-05-11 DIAGNOSIS — I10 Essential (primary) hypertension: Secondary | ICD-10-CM | POA: Diagnosis not present

## 2022-05-11 DIAGNOSIS — F03B18 Unspecified dementia, moderate, with other behavioral disturbance: Secondary | ICD-10-CM | POA: Diagnosis not present

## 2022-05-11 DIAGNOSIS — E441 Mild protein-calorie malnutrition: Secondary | ICD-10-CM | POA: Diagnosis not present

## 2022-05-11 DIAGNOSIS — J449 Chronic obstructive pulmonary disease, unspecified: Secondary | ICD-10-CM | POA: Diagnosis not present

## 2022-06-14 DIAGNOSIS — M1711 Unilateral primary osteoarthritis, right knee: Secondary | ICD-10-CM | POA: Diagnosis not present

## 2022-06-14 DIAGNOSIS — M25561 Pain in right knee: Secondary | ICD-10-CM | POA: Diagnosis not present

## 2022-06-14 DIAGNOSIS — G309 Alzheimer's disease, unspecified: Secondary | ICD-10-CM | POA: Diagnosis not present

## 2022-06-14 DIAGNOSIS — W19XXXA Unspecified fall, initial encounter: Secondary | ICD-10-CM | POA: Diagnosis not present

## 2022-06-16 ENCOUNTER — Emergency Department (HOSPITAL_COMMUNITY)

## 2022-06-16 ENCOUNTER — Emergency Department (HOSPITAL_COMMUNITY)
Admission: EM | Admit: 2022-06-16 | Discharge: 2022-06-16 | Disposition: A | Discharge status: Home / Self Care | Attending: Emergency Medicine | Admitting: Emergency Medicine

## 2022-06-16 ENCOUNTER — Encounter (HOSPITAL_COMMUNITY): Payer: Self-pay | Admitting: Emergency Medicine

## 2022-06-16 ENCOUNTER — Other Ambulatory Visit: Payer: Self-pay

## 2022-06-16 DIAGNOSIS — S022XXA Fracture of nasal bones, initial encounter for closed fracture: Secondary | ICD-10-CM

## 2022-06-16 DIAGNOSIS — W19XXXA Unspecified fall, initial encounter: Secondary | ICD-10-CM | POA: Diagnosis not present

## 2022-06-16 DIAGNOSIS — S0083XA Contusion of other part of head, initial encounter: Secondary | ICD-10-CM | POA: Insufficient documentation

## 2022-06-16 DIAGNOSIS — F0390 Unspecified dementia without behavioral disturbance: Secondary | ICD-10-CM | POA: Diagnosis not present

## 2022-06-16 DIAGNOSIS — I10 Essential (primary) hypertension: Secondary | ICD-10-CM | POA: Diagnosis not present

## 2022-06-16 DIAGNOSIS — R0902 Hypoxemia: Secondary | ICD-10-CM | POA: Diagnosis not present

## 2022-06-16 DIAGNOSIS — T1490XA Injury, unspecified, initial encounter: Secondary | ICD-10-CM | POA: Diagnosis not present

## 2022-06-16 DIAGNOSIS — R404 Transient alteration of awareness: Secondary | ICD-10-CM | POA: Diagnosis not present

## 2022-06-16 DIAGNOSIS — S0990XA Unspecified injury of head, initial encounter: Secondary | ICD-10-CM | POA: Diagnosis present

## 2022-06-16 LAB — CBC WITH DIFFERENTIAL/PLATELET
Abs Immature Granulocytes: 0.01 10*3/uL (ref 0.00–0.07)
Basophils Absolute: 0.1 10*3/uL (ref 0.0–0.1)
Basophils Relative: 1 %
Eosinophils Absolute: 0.2 10*3/uL (ref 0.0–0.5)
Eosinophils Relative: 4 %
HCT: 39.8 % (ref 39.0–52.0)
Hemoglobin: 12.7 g/dL — ABNORMAL LOW (ref 13.0–17.0)
Immature Granulocytes: 0 %
Lymphocytes Relative: 26 %
Lymphs Abs: 1 10*3/uL (ref 0.7–4.0)
MCH: 30.5 pg (ref 26.0–34.0)
MCHC: 31.9 g/dL (ref 30.0–36.0)
MCV: 95.7 fL (ref 80.0–100.0)
Monocytes Absolute: 0.4 10*3/uL (ref 0.1–1.0)
Monocytes Relative: 11 %
Neutro Abs: 2.3 10*3/uL (ref 1.7–7.7)
Neutrophils Relative %: 58 %
Platelets: 281 10*3/uL (ref 150–400)
RBC: 4.16 MIL/uL — ABNORMAL LOW (ref 4.22–5.81)
RDW: 13.1 % (ref 11.5–15.5)
WBC: 3.9 10*3/uL — ABNORMAL LOW (ref 4.0–10.5)
nRBC: 0 % (ref 0.0–0.2)

## 2022-06-16 LAB — BASIC METABOLIC PANEL
Anion gap: 10 (ref 5–15)
BUN: 12 mg/dL (ref 8–23)
CO2: 29 mmol/L (ref 22–32)
Calcium: 9.4 mg/dL (ref 8.9–10.3)
Chloride: 99 mmol/L (ref 98–111)
Creatinine, Ser: 1.1 mg/dL (ref 0.61–1.24)
GFR, Estimated: >60 mL/min (ref >60)
Glucose, Bld: 96 mg/dL (ref 70–99)
Potassium: 3.8 mmol/L (ref 3.5–5.1)
Sodium: 138 mmol/L (ref 135–145)

## 2022-06-16 MED ORDER — MIDAZOLAM HCL 2 MG/2ML IJ SOLN
1.0000 mg | Freq: Once | INTRAMUSCULAR | Status: AC
  Administered 2022-06-16: 1 mg via INTRAVENOUS
  Filled 2022-06-16: qty 2

## 2022-06-16 MED ORDER — MIDAZOLAM HCL 2 MG/2ML IJ SOLN
INTRAMUSCULAR | Status: AC
  Filled 2022-06-16: qty 2

## 2022-06-16 MED ORDER — MIDAZOLAM HCL 2 MG/2ML IJ SOLN
1.0000 mg | Freq: Once | INTRAMUSCULAR | Status: AC
  Administered 2022-06-16: 1 mg via INTRAVENOUS

## 2022-06-16 NOTE — Discharge Instructions (Addendum)
Return for any problem.  ?

## 2022-06-16 NOTE — ED Provider Notes (Signed)
Seen after prior EDP.   CT imaging results discussed with family and patient.   Family comfortable with plan for discharge home.    Importance of close follow-up stressed.  Strict return precautions given and understood.   Valarie Merino, MD 06/16/22 [phone]

## 2022-06-16 NOTE — ED Provider Notes (Signed)
[facility] Provider Note   CSN: [id number] Arrival date & time: 06/16/22  [id number]     History {Add pertinent medical, surgical, social history, OB history to HPI:1} Chief Complaint  Patient presents with   Kahlid Savely is a 89 y.o. male.  HPI    89 year old male comes in with chief complaint of mechanical fall.  Patient resides at home with his daughter.  Patient unable to provide meaningful history.  According to the daughter, patient has history of dementia.  He was doing well.  He went to bed feeling fine.  He woke up, had breakfast.  Thereafter he was walking to the bathroom, behind her and lost his balance and fell.  Patient did lose consciousness upon hitting the head.  Patient also struck his face when he was falling.  Patient has no complaints from his side.  He is not on any blood thinners.  Home Medications Prior to Admission medications   Medication Sig Start Date End Date Taking? Authorizing Provider  albuterol (PROAIR HFA) 108 (90 Base) MCG/ACT inhaler 2 puffs every 4 hours as needed only  if your can't catch your breath 01/23/22   Tanda Rockers, MD  albuterol (PROVENTIL) (2.5 MG/3ML) 0.083% nebulizer solution TAKE 3 MLS BY NEBULIZATION EVERY 6 HOURS AS NEEDED FOR WHEEZING OR SHORTNESS OF BREATH. 02/04/21   Tanda Rockers, MD  alfuzosin (UROXATRAL) 10 MG 24 hr tablet Take 10 mg by mouth daily with breakfast.    [provider]  budesonide-formoterol (SYMBICORT) 80-4.5 MCG/ACT inhaler Inhale 2 puffs into the lungs 2 (two) times daily. 01/23/22   Tanda Rockers, MD  escitalopram (LEXAPRO) 10 MG tablet Take 10 mg by mouth daily.    [provider]  QUEtiapine (SEROQUEL) 25 MG tablet 1 tablet at bedtime Orally Once a day for 90 days 02/16/22   [provider]  risperiDONE (RISPERDAL) 0.5 MG tablet 2 at bedtime and 1 daily as needed    [provider]  temazepam (RESTORIL) 15 MG  capsule 1 capsule at bedtime as needed Orally Once a day    [provider]      Allergies    Patient has no known allergies.    Review of Systems   Review of Systems  Physical Exam Updated Vital Signs BP (!) 146/95   Pulse 76   Temp 98 F (36.7 C) (Oral)   Resp 17   Ht '5\' 5"'$  (1.651 m)   Wt 61.2 kg   SpO2 97%   BMI 22.47 kg/m  Physical Exam Vitals and nursing note reviewed.  Constitutional:      Appearance: He is well-developed.  HENT:     Head:     Comments: Bruising noted to the right side of the face Cardiovascular:     Rate and Rhythm: Normal rate.  Pulmonary:     Effort: Pulmonary effort is normal.  Musculoskeletal:     Cervical back: Neck supple.     Comments: Head to toe evaluation shows no hematoma, bleeding of the scalp, no facial abrasions, no spine step offs, crepitus of the chest or neck, no tenderness to palpation of the bilateral upper and lower extremities, no gross deformities, no chest tenderness, no pelvic pain.   Skin:    General: Skin is warm.  Neurological:     Mental Status: He is alert and oriented to person, place, and time.     ED Results /  Procedures / Treatments   Labs (all labs ordered are listed, but only abnormal results are displayed) Labs Reviewed  CBC WITH DIFFERENTIAL/PLATELET - Abnormal; Notable for the following components:      Result Value   WBC 3.9 (*)    RBC 4.16 (*)    Hemoglobin 12.7 (*)    All other components within normal limits  BASIC METABOLIC PANEL    EKG EKG Interpretation  Date/Time:  Friday June 16 2022 09:48:12 EST Ventricular Rate:  62 PR Interval:  130 QRS Duration: 90 QT Interval:  411 QTC Calculation: 418 R Axis:   84 Text Interpretation: Sinus rhythm Atrial premature complexes Borderline right axis deviation Nonspecific T abnrm, anterolateral leads No acute changes No significant change since last tracing Confirmed by Varney Biles [id number]) on 06/16/2022 10:17:15 AM  Radiology CT  Head Wo Contrast  Result Date: 06/16/2022 CLINICAL DATA:  Head trauma. EXAM: CT HEAD WITHOUT CONTRAST TECHNIQUE: Contiguous axial images were obtained from the base of the skull through the vertex without intravenous contrast. RADIATION DOSE REDUCTION: This exam was performed according to the departmental dose-optimization program which includes automated exposure control, adjustment of the mA and/or kV according to patient size and/or use of iterative reconstruction technique. COMPARISON:  06/13/2020 FINDINGS: Brain: There is periventricular white matter decreased attenuation consistent with small vessel ischemic changes. Ventricles, sulci and cisterns are prominent consistent with age related involutional changes. No acute intracranial hemorrhage, mass effect or shift. No hydrocephalus. Vascular: No hyperdense vessel or unexpected calcification. Skull: Normal. Negative for fracture or focal lesion. Sinuses/Orbits: Mucoperiosteal thickening consistent with chronic left maxillary, frontal and ethmoid sinusitis. IMPRESSION: Atrophy and chronic small vessel ischemic changes. No acute intracranial process identified. Electronically Signed   By: Sammie Bench M.D.   On: 06/16/2022 15:43    Procedures Procedures  {Document cardiac monitor, telemetry assessment procedure when appropriate:1}  Medications Ordered in ED Medications  midazolam (VERSED) 2 MG/2ML injection (has no administration in time range)  midazolam (VERSED) injection 1 mg (1 mg Intravenous Given 06/16/22 1500)  midazolam (VERSED) injection 1 mg (1 mg Intravenous Given 06/16/22 1515)    ED Course/ Medical Decision Making/ A&P Clinical Course as of 06/16/22 1546  Fri Jun 16, 2022  1322 Patient reassessed after CT scan team took him to the scanner and were unsuccessful in getting CT completed.  I have ordered IV Versed.  Family still prefers that patient get CT scans. [AN]    Clinical Course User Index [AN] Varney Biles, MD   {    Click here for ABCD2, HEART and other calculatorsREFRESH Note before signing :1}                          Medical Decision Making Amount and/or Complexity of Data Reviewed Labs: ordered. Radiology: ordered.  Risk Prescription drug management.   Final Clinical Impression(s) / ED Diagnoses Final diagnoses:  Contusion of face, initial encounter  Fall, initial encounter    Rx / DC Orders ED Discharge Orders     None

## 2022-06-16 NOTE — Discharge Planning (Signed)
Pt currently active with [facility] for Home [facility]osp[facility] services as confirmed by [facility] with Abby of [facility].  Pt can resume  services with [facility] at discharge.    Prisila Dlouhy J. Clydene Laming, RN, BSN, NCM  Transitions of Care  Nurse Case Manager  [facility]  Operative Services  [phone]

## 2022-06-16 NOTE — ED Triage Notes (Signed)
Pt BIB [facility] from home after pt fell and hit head on side of wall. Pt from home with daughter and EMS reported daughter said pt was bleeding, but was not sure where, she also said he was LOC for a second. Pt is not on thinners. EMS reported pupils were sluggish. Pt have HX COPD and Alzheimer. V/S BP 170/100, 97% RA, RR 16, HR 72.

## 2022-06-21 ENCOUNTER — Telehealth: Payer: Self-pay

## 2022-06-21 NOTE — Telephone Encounter (Signed)
     Patient  visit on 2/23  at [facility]    Have you been able to follow up with your primary care physician? Yes   The patient was or was not able to obtain any needed medicine or equipment. Yes   Are there diet recommendations that you are having difficulty following? Na   Patient expresses understanding of discharge instructions and education provided has no other needs at this time.  Yes      [facility] [phone] [address], [place], [place] [address] Phone: [phone] Email: [email or web address]

## 2022-07-05 DIAGNOSIS — H35033 Hypertensive retinopathy, bilateral: Secondary | ICD-10-CM | POA: Diagnosis not present

## 2022-07-05 DIAGNOSIS — H35372 Puckering of macula, left eye: Secondary | ICD-10-CM | POA: Diagnosis not present

## 2022-07-05 DIAGNOSIS — H02005 Unspecified entropion of left lower eyelid: Secondary | ICD-10-CM | POA: Diagnosis not present

## 2022-07-05 DIAGNOSIS — H401231 Low-tension glaucoma, bilateral, mild stage: Secondary | ICD-10-CM | POA: Diagnosis not present

## 2022-07-05 DIAGNOSIS — H35353 Cystoid macular degeneration, bilateral: Secondary | ICD-10-CM | POA: Diagnosis not present

## 2022-07-05 DIAGNOSIS — H04123 Dry eye syndrome of bilateral lacrimal glands: Secondary | ICD-10-CM | POA: Diagnosis not present

## 2022-07-13 DIAGNOSIS — I10 Essential (primary) hypertension: Secondary | ICD-10-CM | POA: Diagnosis not present

## 2022-07-13 DIAGNOSIS — J449 Chronic obstructive pulmonary disease, unspecified: Secondary | ICD-10-CM | POA: Diagnosis not present

## 2022-07-13 DIAGNOSIS — E441 Mild protein-calorie malnutrition: Secondary | ICD-10-CM | POA: Diagnosis not present

## 2022-07-13 DIAGNOSIS — F03B18 Unspecified dementia, moderate, with other behavioral disturbance: Secondary | ICD-10-CM | POA: Diagnosis not present

## 2022-07-13 DIAGNOSIS — G319 Degenerative disease of nervous system, unspecified: Secondary | ICD-10-CM | POA: Diagnosis not present

## 2022-07-13 DIAGNOSIS — R2681 Unsteadiness on feet: Secondary | ICD-10-CM | POA: Diagnosis not present

## 2022-07-13 DIAGNOSIS — H9193 Unspecified hearing loss, bilateral: Secondary | ICD-10-CM | POA: Diagnosis not present

## 2022-08-28 ENCOUNTER — Ambulatory Visit: Payer: PPO | Admitting: Cardiovascular Disease

## 2022-08-30 DIAGNOSIS — E785 Hyperlipidemia, unspecified: Secondary | ICD-10-CM | POA: Diagnosis not present

## 2022-08-30 DIAGNOSIS — K219 Gastro-esophageal reflux disease without esophagitis: Secondary | ICD-10-CM | POA: Diagnosis not present

## 2022-08-30 DIAGNOSIS — R7989 Other specified abnormal findings of blood chemistry: Secondary | ICD-10-CM | POA: Diagnosis not present

## 2022-08-30 DIAGNOSIS — F419 Anxiety disorder, unspecified: Secondary | ICD-10-CM | POA: Diagnosis not present

## 2022-08-30 DIAGNOSIS — I10 Essential (primary) hypertension: Secondary | ICD-10-CM | POA: Diagnosis not present

## 2022-08-30 DIAGNOSIS — Z125 Encounter for screening for malignant neoplasm of prostate: Secondary | ICD-10-CM | POA: Diagnosis not present

## 2022-09-04 DIAGNOSIS — R82998 Other abnormal findings in urine: Secondary | ICD-10-CM | POA: Diagnosis not present

## 2022-09-04 DIAGNOSIS — I10 Essential (primary) hypertension: Secondary | ICD-10-CM | POA: Diagnosis not present

## 2022-09-08 DIAGNOSIS — R351 Nocturia: Secondary | ICD-10-CM | POA: Diagnosis not present

## 2022-09-08 DIAGNOSIS — N401 Enlarged prostate with lower urinary tract symptoms: Secondary | ICD-10-CM | POA: Diagnosis not present

## 2022-09-08 DIAGNOSIS — R3915 Urgency of urination: Secondary | ICD-10-CM | POA: Diagnosis not present

## 2022-09-08 DIAGNOSIS — R350 Frequency of micturition: Secondary | ICD-10-CM | POA: Diagnosis not present

## 2022-09-29 DIAGNOSIS — R051 Acute cough: Secondary | ICD-10-CM | POA: Diagnosis not present

## 2022-09-29 DIAGNOSIS — Z23 Encounter for immunization: Secondary | ICD-10-CM | POA: Diagnosis not present

## 2022-10-24 ENCOUNTER — Ambulatory Visit
Admission: EM | Admit: 2022-10-24 | Discharge: 2022-10-24 | Disposition: A | Discharge status: Home / Self Care | Payer: PPO

## 2022-10-24 DIAGNOSIS — J22 Unspecified acute lower respiratory infection: Secondary | ICD-10-CM | POA: Diagnosis not present

## 2022-10-24 MED ORDER — PREDNISONE 20 MG PO TABS: 20.0000 mg | ORAL_TABLET | Freq: Every day | ORAL | 0 refills | Status: AC

## 2022-10-24 MED ORDER — AMOXICILLIN-POT CLAVULANATE 875-125 MG PO TABS: 1.0000 | ORAL_TABLET | Freq: Two times a day (BID) | ORAL | 0 refills | Status: DC

## 2022-10-24 MED ORDER — IPRATROPIUM-ALBUTEROL 0.5-2.5 (3) MG/3ML IN SOLN: 3.0000 mL | Freq: Four times a day (QID) | RESPIRATORY_TRACT | 0 refills | Status: DC | PRN

## 2022-10-24 MED ORDER — IPRATROPIUM-ALBUTEROL 0.5-2.5 (3) MG/3ML IN SOLN
3.0000 mL | Freq: Once | RESPIRATORY_TRACT | Status: AC
  Administered 2022-10-24: 3 mL via RESPIRATORY_TRACT

## 2022-10-24 NOTE — ED Triage Notes (Signed)
Cough has been for about 3 wks now. No fever.
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
## 2022-11-28 DIAGNOSIS — R410 Disorientation, unspecified: Secondary | ICD-10-CM | POA: Diagnosis not present

## 2022-11-29 ENCOUNTER — Emergency Department (HOSPITAL_COMMUNITY): Payer: PPO

## 2022-11-29 ENCOUNTER — Inpatient Hospital Stay (HOSPITAL_COMMUNITY)
Admission: EM | Admit: 2022-11-29 | Discharge: 2022-12-05 | DRG: 871 | Disposition: A | Discharge status: Home / Self Care | Payer: PPO | Attending: Internal Medicine | Admitting: Internal Medicine

## 2022-11-29 ENCOUNTER — Other Ambulatory Visit: Payer: Self-pay

## 2022-11-29 DIAGNOSIS — H9190 Unspecified hearing loss, unspecified ear: Secondary | ICD-10-CM | POA: Diagnosis present

## 2022-11-29 DIAGNOSIS — F0390 Unspecified dementia without behavioral disturbance: Secondary | ICD-10-CM | POA: Diagnosis present

## 2022-11-29 DIAGNOSIS — Z781 Physical restraint status: Secondary | ICD-10-CM

## 2022-11-29 DIAGNOSIS — Z8249 Family history of ischemic heart disease and other diseases of the circulatory system: Secondary | ICD-10-CM

## 2022-11-29 DIAGNOSIS — E872 Acidosis, unspecified: Secondary | ICD-10-CM | POA: Diagnosis present

## 2022-11-29 DIAGNOSIS — I6782 Cerebral ischemia: Secondary | ICD-10-CM | POA: Diagnosis not present

## 2022-11-29 DIAGNOSIS — F02811 Dementia in other diseases classified elsewhere, unspecified severity, with agitation: Secondary | ICD-10-CM | POA: Diagnosis not present

## 2022-11-29 DIAGNOSIS — Z6821 Body mass index (BMI) 21.0-21.9, adult: Secondary | ICD-10-CM | POA: Diagnosis not present

## 2022-11-29 DIAGNOSIS — U071 COVID-19: Secondary | ICD-10-CM | POA: Diagnosis present

## 2022-11-29 DIAGNOSIS — A4159 Other Gram-negative sepsis: Principal | ICD-10-CM | POA: Diagnosis present

## 2022-11-29 DIAGNOSIS — J9601 Acute respiratory failure with hypoxia: Secondary | ICD-10-CM | POA: Diagnosis present

## 2022-11-29 DIAGNOSIS — Z800 Family history of malignant neoplasm of digestive organs: Secondary | ICD-10-CM

## 2022-11-29 DIAGNOSIS — Z66 Do not resuscitate: Secondary | ICD-10-CM | POA: Diagnosis present

## 2022-11-29 DIAGNOSIS — R9431 Abnormal electrocardiogram [ECG] [EKG]: Secondary | ICD-10-CM | POA: Diagnosis not present

## 2022-11-29 DIAGNOSIS — Z823 Family history of stroke: Secondary | ICD-10-CM

## 2022-11-29 DIAGNOSIS — F05 Delirium due to known physiological condition: Secondary | ICD-10-CM | POA: Diagnosis not present

## 2022-11-29 DIAGNOSIS — K219 Gastro-esophageal reflux disease without esophagitis: Secondary | ICD-10-CM | POA: Diagnosis present

## 2022-11-29 DIAGNOSIS — F0283 Dementia in other diseases classified elsewhere, unspecified severity, with mood disturbance: Secondary | ICD-10-CM | POA: Diagnosis present

## 2022-11-29 DIAGNOSIS — R4182 Altered mental status, unspecified: Secondary | ICD-10-CM | POA: Diagnosis not present

## 2022-11-29 DIAGNOSIS — G309 Alzheimer's disease, unspecified: Secondary | ICD-10-CM | POA: Diagnosis present

## 2022-11-29 DIAGNOSIS — B961 Klebsiella pneumoniae [K. pneumoniae] as the cause of diseases classified elsewhere: Secondary | ICD-10-CM

## 2022-11-29 DIAGNOSIS — Z83438 Family history of other disorder of lipoprotein metabolism and other lipidemia: Secondary | ICD-10-CM

## 2022-11-29 DIAGNOSIS — N400 Enlarged prostate without lower urinary tract symptoms: Secondary | ICD-10-CM | POA: Diagnosis present

## 2022-11-29 DIAGNOSIS — Z79899 Other long term (current) drug therapy: Secondary | ICD-10-CM | POA: Diagnosis not present

## 2022-11-29 DIAGNOSIS — R0902 Hypoxemia: Secondary | ICD-10-CM | POA: Diagnosis present

## 2022-11-29 DIAGNOSIS — A419 Sepsis, unspecified organism: Secondary | ICD-10-CM | POA: Diagnosis not present

## 2022-11-29 DIAGNOSIS — Z8042 Family history of malignant neoplasm of prostate: Secondary | ICD-10-CM

## 2022-11-29 DIAGNOSIS — F32A Depression, unspecified: Secondary | ICD-10-CM | POA: Diagnosis present

## 2022-11-29 DIAGNOSIS — S199XXA Unspecified injury of neck, initial encounter: Secondary | ICD-10-CM | POA: Diagnosis not present

## 2022-11-29 DIAGNOSIS — I2510 Atherosclerotic heart disease of native coronary artery without angina pectoris: Secondary | ICD-10-CM | POA: Diagnosis present

## 2022-11-29 DIAGNOSIS — I119 Hypertensive heart disease without heart failure: Secondary | ICD-10-CM | POA: Diagnosis present

## 2022-11-29 DIAGNOSIS — T83511A Infection and inflammatory reaction due to indwelling urethral catheter, initial encounter: Secondary | ICD-10-CM | POA: Diagnosis not present

## 2022-11-29 DIAGNOSIS — N390 Urinary tract infection, site not specified: Secondary | ICD-10-CM | POA: Diagnosis present

## 2022-11-29 DIAGNOSIS — E440 Moderate protein-calorie malnutrition: Secondary | ICD-10-CM | POA: Diagnosis present

## 2022-11-29 DIAGNOSIS — E785 Hyperlipidemia, unspecified: Secondary | ICD-10-CM | POA: Diagnosis present

## 2022-11-29 DIAGNOSIS — I10 Essential (primary) hypertension: Secondary | ICD-10-CM | POA: Diagnosis present

## 2022-11-29 DIAGNOSIS — R6520 Severe sepsis without septic shock: Secondary | ICD-10-CM | POA: Diagnosis present

## 2022-11-29 DIAGNOSIS — G9389 Other specified disorders of brain: Secondary | ICD-10-CM | POA: Diagnosis not present

## 2022-11-29 DIAGNOSIS — F02818 Dementia in other diseases classified elsewhere, unspecified severity, with other behavioral disturbance: Secondary | ICD-10-CM | POA: Diagnosis present

## 2022-11-29 DIAGNOSIS — M549 Dorsalgia, unspecified: Secondary | ICD-10-CM | POA: Diagnosis not present

## 2022-11-29 DIAGNOSIS — Z87891 Personal history of nicotine dependence: Secondary | ICD-10-CM

## 2022-11-29 DIAGNOSIS — I959 Hypotension, unspecified: Secondary | ICD-10-CM | POA: Diagnosis present

## 2022-11-29 DIAGNOSIS — J440 Chronic obstructive pulmonary disease with acute lower respiratory infection: Secondary | ICD-10-CM | POA: Diagnosis present

## 2022-11-29 DIAGNOSIS — W19XXXA Unspecified fall, initial encounter: Secondary | ICD-10-CM

## 2022-11-29 DIAGNOSIS — M47812 Spondylosis without myelopathy or radiculopathy, cervical region: Secondary | ICD-10-CM | POA: Diagnosis not present

## 2022-11-29 DIAGNOSIS — R7881 Bacteremia: Secondary | ICD-10-CM

## 2022-11-29 DIAGNOSIS — M4802 Spinal stenosis, cervical region: Secondary | ICD-10-CM | POA: Diagnosis not present

## 2022-11-29 DIAGNOSIS — J410 Simple chronic bronchitis: Secondary | ICD-10-CM | POA: Diagnosis not present

## 2022-11-29 DIAGNOSIS — R402411 Glasgow coma scale score 13-15, in the field [EMT or ambulance]: Secondary | ICD-10-CM | POA: Diagnosis not present

## 2022-11-29 DIAGNOSIS — J449 Chronic obstructive pulmonary disease, unspecified: Secondary | ICD-10-CM | POA: Diagnosis present

## 2022-11-29 DIAGNOSIS — Z9181 History of falling: Secondary | ICD-10-CM

## 2022-11-29 DIAGNOSIS — Z7951 Long term (current) use of inhaled steroids: Secondary | ICD-10-CM

## 2022-11-29 DIAGNOSIS — R509 Fever, unspecified: Secondary | ICD-10-CM | POA: Diagnosis not present

## 2022-11-29 LAB — CBC WITH DIFFERENTIAL/PLATELET
Abs Immature Granulocytes: 0.08 10*3/uL — ABNORMAL HIGH (ref 0.00–0.07)
Basophils Absolute: 0 10*3/uL (ref 0.0–0.1)
Basophils Relative: 0 %
Eosinophils Absolute: 0 10*3/uL (ref 0.0–0.5)
Eosinophils Relative: 0 %
HCT: 38 % — ABNORMAL LOW (ref 39.0–52.0)
Hemoglobin: 11.7 g/dL — ABNORMAL LOW (ref 13.0–17.0)
Immature Granulocytes: 1 %
Lymphocytes Relative: 9 %
Lymphs Abs: 1.3 10*3/uL (ref 0.7–4.0)
MCH: 30.2 pg (ref 26.0–34.0)
MCHC: 30.8 g/dL (ref 30.0–36.0)
MCV: 97.9 fL (ref 80.0–100.0)
Monocytes Absolute: 0.6 10*3/uL (ref 0.1–1.0)
Monocytes Relative: 4 %
Neutro Abs: 12 10*3/uL — ABNORMAL HIGH (ref 1.7–7.7)
Neutrophils Relative %: 86 %
Platelets: 247 10*3/uL (ref 150–400)
RBC: 3.88 MIL/uL — ABNORMAL LOW (ref 4.22–5.81)
RDW: 12.6 % (ref 11.5–15.5)
WBC: 14 10*3/uL — ABNORMAL HIGH (ref 4.0–10.5)
nRBC: 0 % (ref 0.0–0.2)

## 2022-11-29 LAB — BASIC METABOLIC PANEL
Anion gap: 9 (ref 5–15)
BUN: 21 mg/dL (ref 8–23)
CO2: 28 mmol/L (ref 22–32)
Calcium: 9 mg/dL (ref 8.9–10.3)
Chloride: 103 mmol/L (ref 98–111)
Creatinine, Ser: 0.87 mg/dL (ref 0.61–1.24)
GFR, Estimated: >60 mL/min (ref >60)
Glucose, Bld: 109 mg/dL — ABNORMAL HIGH (ref 70–99)
Potassium: 3.7 mmol/L (ref 3.5–5.1)
Sodium: 140 mmol/L (ref 135–145)

## 2022-11-29 LAB — LACTIC ACID, PLASMA
Lactic Acid, Venous: 2.1 mmol/L (ref 0.5–1.9)
Lactic Acid, Venous: 0.9 mmol/L (ref 0.5–1.9)

## 2022-11-29 LAB — URINALYSIS, W/ REFLEX TO CULTURE (INFECTION SUSPECTED)
Bacteria, UA: NONE SEEN
RBC / HPF: >50 RBC/hpf (ref 0–5)
WBC, UA: >50 WBC/hpf (ref 0–5)

## 2022-11-29 LAB — SARS CORONAVIRUS 2 BY RT PCR: SARS Coronavirus 2 by RT PCR: POSITIVE — AB

## 2022-11-29 LAB — MRSA NEXT GEN BY PCR, NASAL: MRSA by PCR Next Gen: NOT DETECTED

## 2022-11-29 LAB — I-STAT CG4 LACTIC ACID, ED: Lactic Acid, Venous: 2.7 mmol/L (ref 0.5–1.9)

## 2022-11-29 LAB — PROTIME-INR
INR: 1.2 (ref 0.8–1.2)
Prothrombin Time: 15.4 s — ABNORMAL HIGH (ref 11.4–15.2)

## 2022-11-29 LAB — CULTURE, BLOOD (ROUTINE X 2): Special Requests: ADEQUATE

## 2022-11-29 LAB — APTT: aPTT: 28 seconds (ref 24–36)

## 2022-11-29 MED ORDER — DEXAMETHASONE SODIUM PHOSPHATE 10 MG/ML IJ SOLN
6.0000 mg | INTRAMUSCULAR | Status: DC
  Administered 2022-11-30 – 2022-12-05 (×6): 6 mg via INTRAVENOUS
  Filled 2022-11-29 (×6): qty 1

## 2022-11-29 MED ORDER — METHYLPREDNISOLONE SODIUM SUCC 40 MG IJ SOLR
40.0000 mg | Freq: Once | INTRAMUSCULAR | Status: AC
  Administered 2022-11-29: 40 mg via INTRAVENOUS
  Filled 2022-11-29: qty 1

## 2022-11-29 MED ORDER — ALBUTEROL SULFATE (2.5 MG/3ML) 0.083% IN NEBU: 2.5000 mg | INHALATION_SOLUTION | RESPIRATORY_TRACT | Status: DC | PRN

## 2022-11-29 MED ORDER — HYOSCYAMINE SULFATE 0.125 MG PO TABS
0.1250 mg | ORAL_TABLET | Freq: Three times a day (TID) | ORAL | Status: DC | PRN
  Filled 2022-11-29: qty 1

## 2022-11-29 MED ORDER — CHLORHEXIDINE GLUCONATE CLOTH 2 % EX PADS
6.0000 | MEDICATED_PAD | Freq: Every day | CUTANEOUS | Status: DC
  Administered 2022-11-29 – 2022-12-05 (×7): 6 via TOPICAL

## 2022-11-29 MED ORDER — ONDANSETRON HCL 4 MG PO TABS: 4.0000 mg | ORAL_TABLET | Freq: Four times a day (QID) | ORAL | Status: DC | PRN

## 2022-11-29 MED ORDER — INSULIN ASPART 100 UNIT/ML IJ SOLN
0.0000 [IU] | Freq: Three times a day (TID) | INTRAMUSCULAR | Status: DC
  Administered 2022-11-30 – 2022-12-02 (×2): 3 [IU] via SUBCUTANEOUS
  Administered 2022-12-03 – 2022-12-04 (×2): 4 [IU] via SUBCUTANEOUS

## 2022-11-29 MED ORDER — SODIUM CHLORIDE 0.9 % IV SOLN
1.0000 g | Freq: Once | INTRAVENOUS | Status: AC
  Administered 2022-11-29: 1 g via INTRAVENOUS
  Filled 2022-11-29: qty 10

## 2022-11-29 MED ORDER — SODIUM CHLORIDE 0.9 % IV SOLN
100.0000 mg | Freq: Every day | INTRAVENOUS | Status: AC
  Administered 2022-11-30 – 2022-12-01 (×2): 100 mg via INTRAVENOUS
  Filled 2022-11-29 (×2): qty 20

## 2022-11-29 MED ORDER — SODIUM CHLORIDE 0.9 % IV BOLUS
500.0000 mL | Freq: Once | INTRAVENOUS | Status: AC
  Administered 2022-11-29: 500 mL via INTRAVENOUS

## 2022-11-29 MED ORDER — TRAZODONE HCL 100 MG PO TABS
100.0000 mg | ORAL_TABLET | Freq: Every day | ORAL | Status: DC
  Administered 2022-11-30 – 2022-12-05 (×6): 100 mg via ORAL
  Filled 2022-11-29 (×2): qty 2
  Filled 2022-11-29: qty 1
  Filled 2022-11-29 (×3): qty 2

## 2022-11-29 MED ORDER — ACETAMINOPHEN 650 MG RE SUPP: 650.0000 mg | Freq: Four times a day (QID) | RECTAL | Status: DC | PRN

## 2022-11-29 MED ORDER — SODIUM CHLORIDE 0.9 % IV SOLN
1.0000 g | INTRAVENOUS | Status: DC
  Filled 2022-11-29: qty 10

## 2022-11-29 MED ORDER — ESCITALOPRAM OXALATE 20 MG PO TABS
20.0000 mg | ORAL_TABLET | Freq: Every day | ORAL | Status: DC
  Administered 2022-11-30 – 2022-12-05 (×6): 20 mg via ORAL
  Filled 2022-11-29 (×6): qty 1

## 2022-11-29 MED ORDER — SODIUM CHLORIDE 0.9 % IV SOLN
200.0000 mg | Freq: Once | INTRAVENOUS | Status: AC
  Administered 2022-11-29: 200 mg via INTRAVENOUS
  Filled 2022-11-29: qty 40

## 2022-11-29 MED ORDER — ACETAMINOPHEN 325 MG PO TABS
650.0000 mg | ORAL_TABLET | Freq: Four times a day (QID) | ORAL | Status: DC | PRN
  Administered 2022-12-01: 650 mg via ORAL
  Filled 2022-11-29: qty 2

## 2022-11-29 MED ORDER — ACETAMINOPHEN 650 MG RE SUPP
650.0000 mg | Freq: Once | RECTAL | Status: AC
  Administered 2022-11-29: 650 mg via RECTAL
  Filled 2022-11-29: qty 1

## 2022-11-29 MED ORDER — ONDANSETRON HCL 4 MG/2ML IJ SOLN: 4.0000 mg | Freq: Four times a day (QID) | INTRAMUSCULAR | Status: DC | PRN

## 2022-11-29 MED ORDER — HALOPERIDOL LACTATE 5 MG/ML IJ SOLN
2.0000 mg | Freq: Once | INTRAMUSCULAR | Status: AC
  Administered 2022-11-29: 2 mg via INTRAVENOUS
  Filled 2022-11-29: qty 1

## 2022-11-29 MED ORDER — LACTATED RINGERS IV SOLN: INTRAVENOUS | Status: DC

## 2022-11-29 MED ORDER — RISPERIDONE 1 MG PO TABS
1.0000 mg | ORAL_TABLET | Freq: Every day | ORAL | Status: DC
  Administered 2022-11-30: 1 mg via ORAL
  Filled 2022-11-29: qty 1

## 2022-11-29 MED ORDER — HALOPERIDOL LACTATE 5 MG/ML IJ SOLN
3.0000 mg | Freq: Four times a day (QID) | INTRAMUSCULAR | Status: AC | PRN
  Administered 2022-11-29 – 2022-11-30 (×2): 3 mg via INTRAVENOUS
  Filled 2022-11-29 (×2): qty 1

## 2022-11-29 NOTE — ED Provider Notes (Signed)
EMERGENCY DEPARTMENT AT [facility] Provider Note   CSN: [id number] Arrival date & time: 11/29/22  1050     History  Chief Complaint  Patient presents with   Marletta Lor    Alexander [name] is a 89 y.o. male.  Patient is an 89 year old male with a history of COPD with chronic dyspnea, GERD, dementia, CAD who presents today from [facility] after falling forward out of the wheelchair.  They reported that when they checked his blood pressure today his blood pressure was 100/60 which was a bit low for him so they placed him in a wheelchair.  However his legs got tangled in the wheelchair causing him to fall face forward onto the floor.  No loss of consciousness and patient was initially complaining of back and neck pain but after they got him out of the floor he states that everything was fine.  Here patient is trying to get out of the bed and states he feels okay.  He has no complaint of head or neck pain currently.  He denies any pain in his arms or legs.  No chest or abdominal tenderness.  Patient does not take any anticoagulation.  He also does not appear to be on any blood pressure medication.  Patient's family did arrive and reports that he had been his normal self recently and there have been no issues.  He had been eating and drinking and they had no concerns.  The history is provided by the patient, the EMS personnel, medical records and a relative.  Fall       Home Medications Prior to Admission medications   Medication Sig Start Date End Date Taking? Authorizing Provider  albuterol (PROAIR HFA) 108 (90 Base) MCG/ACT inhaler 2 puffs every 4 hours as needed only  if your can't catch your breath 01/23/22   Nyoka Cowden, MD  albuterol (PROVENTIL) (2.5 MG/3ML) 0.083% nebulizer solution TAKE 3 MLS BY NEBULIZATION EVERY 6 HOURS AS NEEDED FOR WHEEZING OR SHORTNESS OF BREATH. 02/04/21   Nyoka Cowden, MD  alfuzosin (UROXATRAL) 10 MG 24 hr tablet Take 10 mg by mouth  daily with breakfast.    [provider]  alfuzosin (UROXATRAL) 10 MG 24 hr tablet Take by mouth. 12/28/21   [provider]  amoxicillin-clavulanate (AUGMENTIN) 875-125 MG tablet Take 1 tablet by mouth 2 (two) times daily. 10/24/22   Bing Neighbors, NP  azithromycin (ZITHROMAX) 250 MG tablet Take by mouth daily. 09/29/22   [provider]  budesonide-formoterol (SYMBICORT) 80-4.5 MCG/ACT inhaler Inhale 2 puffs into the lungs 2 (two) times daily. 01/23/22   Nyoka Cowden, MD  [facility] ACETAMINOPHEN 325 MG tablet Take 650 mg by mouth every 4 (four) hours as needed. 07/04/22   [provider]  doxycycline (VIBRA-TABS) 100 MG tablet Take 100 mg by mouth 2 (two) times daily. 09/04/22   [provider]  escitalopram (LEXAPRO) 10 MG tablet Take 10 mg by mouth daily.    [provider]  escitalopram (LEXAPRO) 20 MG tablet Take 20 mg by mouth daily.    [provider]  escitalopram (LEXAPRO) 5 MG tablet Take 5 mg by mouth at bedtime. 07/10/22   [provider]  ipratropium-albuterol (DUONEB) 0.5-2.5 (3) MG/3ML SOLN Take 3 mLs by nebulization every 6 (six) hours as needed (Wheezing and Shortness of Breath). 10/24/22   Bing Neighbors, NP  LORazepam (ATIVAN) 0.5 MG tablet Take 0.5 mg by mouth every 4 (four) hours as needed.  10/17/22   [provider]  LORazepam (ATIVAN) 2 MG/ML concentrated solution Take by mouth. 07/08/22   [provider]  ondansetron (ZOFRAN-ODT) 4 MG disintegrating tablet Take by mouth. 04/23/22   [provider]  QUEtiapine (SEROQUEL) 25 MG tablet 1 tablet at bedtime Orally Once a day for 90 days 02/16/22   [provider]  risperiDONE (RISPERDAL) 0.5 MG tablet 2 at bedtime and 1 daily as needed    [provider]  risperiDONE (RISPERDAL) 1 MG tablet Take 1 mg by mouth at bedtime. 07/25/22   [provider]  temazepam (RESTORIL) 15 MG capsule 1 capsule at bedtime as needed  Orally Once a day    [provider]  tolterodine (DETROL) 1 MG tablet SMARTSIG:1 Tablet(s) By Mouth Every Evening PRN 09/27/22   [provider]  traZODone (DESYREL) 100 MG tablet Take 100 mg by mouth daily. 10/04/22   [provider]  traZODone (DESYREL) 50 MG tablet Take 25 mg by mouth at bedtime. 05/11/22   [provider]      Allergies    Food    Review of Systems   Review of Systems  Physical Exam Updated Vital Signs BP (!) 152/97   Pulse (!) 124   Temp 98 F (36.7 C) (Oral)   Resp (!) 21   SpO2 (!) 87%  Physical Exam Vitals and nursing note reviewed.  Constitutional:      General: He is not in acute distress.    Appearance: He is well-developed.  HENT:     Head: Normocephalic and atraumatic.  Eyes:     Conjunctiva/sclera: Conjunctivae normal.     Pupils: Pupils are equal, round, and reactive to light.  Cardiovascular:     Rate and Rhythm: Normal rate and regular rhythm.     Heart sounds: No murmur heard. Pulmonary:     Effort: Pulmonary effort is normal. No respiratory distress.     Breath sounds: Normal breath sounds. No wheezing or rales.  Abdominal:     General: There is no distension.     Palpations: Abdomen is soft.     Tenderness: There is no abdominal tenderness. There is no guarding or rebound.  Musculoskeletal:        General: No tenderness. Normal range of motion.     Cervical back: Normal range of motion and neck supple. No tenderness or bony tenderness. No spinous process tenderness or muscular tenderness.     Thoracic back: No bony tenderness.     Lumbar back: No bony tenderness.     Comments: Able to fully range bilateral shoulders elbows, wrists, hips, knees and ankles without any difficulty  Skin:    General: Skin is warm and dry.     Findings: No erythema or rash.  Neurological:     Mental Status: He is alert.     Comments: Oriented to self.  Able to follow simple instructions.  Strength is 5 out of 5 in all 4  extremities with normal sensation.  Psychiatric:     Comments: Cooperative     ED Results / Procedures / Treatments   Labs (all labs ordered are listed, but only abnormal results are displayed) Labs Reviewed  CBC WITH DIFFERENTIAL/PLATELET - Abnormal; Notable for the following components:      Result Value   WBC 14.0 (*)    RBC 3.88 (*)    Hemoglobin 11.7 (*)    HCT 38.0 (*)    Neutro Abs 12.0 (*)  Abs Immature Granulocytes 0.08 (*)    All other components within normal limits  BASIC METABOLIC PANEL - Abnormal; Notable for the following components:   Glucose, Bld 109 (*)    All other components within normal limits  URINALYSIS, W/ REFLEX TO CULTURE (INFECTION SUSPECTED) - Abnormal; Notable for the following components:   Color, Urine RED (*)    APPearance TURBID (*)    Glucose, UA   (*)    Value: TEST NOT REPORTED DUE TO COLOR INTERFERENCE OF URINE PIGMENT   Hgb urine dipstick   (*)    Value: TEST NOT REPORTED DUE TO COLOR INTERFERENCE OF URINE PIGMENT   Bilirubin Urine   (*)    Value: TEST NOT REPORTED DUE TO COLOR INTERFERENCE OF URINE PIGMENT   Ketones, ur   (*)    Value: TEST NOT REPORTED DUE TO COLOR INTERFERENCE OF URINE PIGMENT   Protein, ur   (*)    Value: TEST NOT REPORTED DUE TO COLOR INTERFERENCE OF URINE PIGMENT   Nitrite   (*)    Value: TEST NOT REPORTED DUE TO COLOR INTERFERENCE OF URINE PIGMENT   Leukocytes,Ua   (*)    Value: TEST NOT REPORTED DUE TO COLOR INTERFERENCE OF URINE PIGMENT   All other components within normal limits  I-STAT CG4 LACTIC ACID, ED - Abnormal; Notable for the following components:   Lactic Acid, Venous 2.7 (*)    All other components within normal limits  URINE CULTURE  CULTURE, BLOOD (ROUTINE X 2)  CULTURE, BLOOD (ROUTINE X 2)  SARS CORONAVIRUS 2 BY RT PCR  PROTIME-INR  APTT    EKG EKG Interpretation Date/Time:  Wednesday November 29 2022 10:57:57 EDT Ventricular Rate:  84 PR Interval:  135 QRS Duration:  99 QT  Interval:  330 QTC Calculation: 390 R Axis:   88  Text Interpretation: Sinus rhythm Anteroseptal infarct, age indeterminate No significant change since last tracing Confirmed by Gwyneth Sprout ([id number]) on 11/29/2022 11:30:05 AM  Radiology CT Cervical Spine Wo Contrast  Result Date: 11/29/2022 CLINICAL DATA:  Mental status change.  Neck trauma. EXAM: CT CERVICAL SPINE WITHOUT CONTRAST TECHNIQUE: Multidetector CT imaging of the cervical spine was performed without intravenous contrast. Multiplanar CT image reconstructions were also generated. RADIATION DOSE REDUCTION: This exam was performed according to the departmental dose-optimization program which includes automated exposure control, adjustment of the mA and/or kV according to patient size and/or use of iterative reconstruction technique. COMPARISON:  06/16/2022 FINDINGS: Alignment: Mild scoliotic curvature.  No traumatic malalignment. Skull base and vertebrae: No fracture or focal lesion. Soft tissues and spinal canal: No traumatic soft tissue finding. Disc levels: Chronic degenerative spondylosis from C3-4 through C7-T1. Facet osteoarthritis most notable on the right at C4-5 and C5-6. No apparent bony canal stenosis. Bony foraminal narrowing of a mild degree at C4-5. Upper chest: Emphysema and pulmonary scarring. No active process in the portions included. Other: None IMPRESSION: No acute or traumatic finding. Chronic degenerative changes as outlined above. Electronically Signed   By: Paulina Fusi M.D.   On: 11/29/2022 12:59   CT Head Wo Contrast  Result Date: 11/29/2022 CLINICAL DATA:  Low blood pressure. EXAM: CT HEAD WITHOUT CONTRAST TECHNIQUE: Contiguous axial images were obtained from the base of the skull through the vertex without intravenous contrast. RADIATION DOSE REDUCTION: This exam was performed according to the departmental dose-optimization program which includes automated exposure control, adjustment of the mA and/or kV according to  patient size and/or use of iterative reconstruction technique.  COMPARISON:  06/16/2022 FINDINGS: Brain: Generalized volume loss. Chronic benign cerebellar calcifications are stable. Chronic small-vessel ischemic changes throughout the cerebral hemispheric white matter appear similar to the prior study. No sign of acute infarction, mass lesion, hemorrhage, hydrocephalus or extra-axial collection. Vascular: There is atherosclerotic calcification of the major vessels at the base of the brain. Skull: Negative Sinuses/Orbits: Some fluid layering in the right maxillary sinus. Other sinuses clear. Orbits negative. Other: None IMPRESSION: 1. No acute intracranial finding. Atrophy and chronic small-vessel ischemic changes of the white matter, similar to the study of 06/16/2022. 2. Some fluid layering in the right maxillary sinus. Electronically Signed   By: Paulina Fusi M.D.   On: 11/29/2022 12:57    Procedures Procedures    Medications Ordered in ED Medications  lactated ringers infusion (has no administration in time range)  acetaminophen (TYLENOL) suppository 650 mg (has no administration in time range)  haloperidol lactate (HALDOL) injection 2 mg (has no administration in time range)    ED Course/ Medical Decision Making/ A&P                                 Medical Decision Making Amount and/or Complexity of Data Reviewed Independent Historian:     Details: family External Data Reviewed: notes. Labs: ordered. Decision-making details documented in ED Course. Radiology: ordered and independent interpretation performed. Decision-making details documented in ED Course. ECG/medicine tests: ordered and independent interpretation performed. Decision-making details documented in ED Course.  Risk OTC drugs. Prescription drug management.   Pt with multiple medical problems and comorbidities and presenting today with a complaint that caries a high risk for morbidity and mortality.  Here today after a  fall from a wheelchair which was witnessed.  It was reported that patient's blood pressure was normal than his baseline today at 100/60 and that is why they placed him in the wheelchair.  Patient is currently not complaining of anything but did have a witnessed injury to the head and face.  He has no facial swelling or discomfort at this time.  He was complaining of neck pain earlier but does not appear to have any neurologic issues.  Blood pressure here was 104/59.  He does not take any blood pressure medication family reports that he has been his normal self has been eating and drinking regularly and they have not had any specific concerns.  Patient did see his doctor and had blood work earlier this year all which was unremarkable.  3:29 PM I have independently visualized and interpreted pt's images today.  CT head without evidence of intracranial bleed, CT cervical spine without fracture.  However when family arrived they wanted to ensure everything with his urine looked okay and his labs were normal.  I independently interpreted patient's EKG and labs.  EKG without acute changes, CBC with a leukocytosis of 14 and stable hemoglobin of 11 and BMP, UA today with blood and greater than 50 white cells but no bacteria and urine culture was sent.  Foley catheter was changed.  3:29 PM Patient's whose daughter now cares for him who he lives with has arrived and reports for the last few days patient has not been himself.  He he had chills yesterday at his day program and they sent him home and he did feel warm to her so she gave him Tylenol but in the middle of the night he seemed to have trouble breathing and she gave  him a breathing treatment.  He initially was stable when he got here but within the last 30 minutes now patient has become tachycardic, hypoxic and is now showing signs of metabolic encephalopathy.  To touch patient is very warm and suspect he has a fever.  Patient now displaying signs of sepsis.  2 L  of oxygen has improved patient's oxygen levels to the mid 90s.  Patient given a fluid bolus, rectal temperature and Tylenol suppository given.  Patient covered with Rocephin due to concern for a urinary source.  Also COVID and chest x-ray pending.  Patient will require admission at this point.  Lactate elevated a t 2.7.         Final Clinical Impression(s) / ED Diagnoses Final diagnoses:  Sepsis, due to unspecified organism, unspecified whether acute organ dysfunction present [facility])  Fall, initial encounter  Urinary tract infection associated with indwelling urethral catheter, initial encounter [facility])    Rx / DC Orders ED Discharge Orders     None         Gwyneth Sprout, MD 11/29/22 1529

## 2022-11-29 NOTE — ED Provider Notes (Signed)
New Foley Catheter placed. Meets sepsis criteria, Rocephin given, will need admission. Covid pending.  Physical Exam  BP (!) 152/97   Pulse (!) 124   Temp 98 F (36.7 C) (Oral)   Resp (!) 21   SpO2 (!) 87%   Physical Exam  Procedures  Procedures  ED Course / MDM    Medical Decision Making Amount and/or Complexity of Data Reviewed Labs: ordered. Radiology: ordered.  Risk OTC drugs. Prescription drug management. Decision regarding hospitalization.  Dr. Robb Matar consulted for admission        Arby Barrette, MD 12/05/22 1422
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
## 2022-11-29 NOTE — ED Notes (Signed)
O2 on 87 room air, RN notified

## 2022-11-29 NOTE — Progress Notes (Signed)
Alexander [name] ED [facility]       This patient is a current hospice patient with [facility], admitted 2.28.24 with a terminal diagnosis of abnormal weight loss.     [facility] will continue to follow for any discharge planning needs and to coordinate continuation of hospice care.   Please don't hesitate to call with any Hospice related questions or concerns.    Thank you for the opportunity to participate in this patient's care. Thea Gist, Charity fundraiser, BSN [facility] Liaison [phone]

## 2022-11-29 NOTE — ED Triage Notes (Addendum)
Pt is coming from [facility]. Noticed pts blood pressure (100/60) was lower than normal, and placed in a wheel. Had a witnessed fall from the wheel chair, states his legs were caught under the chair and caused him to fall out of it.. Denies LOC, or blood thinners. Originally complaining of neck/back pain, once pt was stood up pt complaining states he no longer had pain. Hx of dementia, alert to baseline.

## 2022-11-29 NOTE — ED Notes (Signed)
Patient transported to CT 

## 2022-11-30 DIAGNOSIS — J9601 Acute respiratory failure with hypoxia: Secondary | ICD-10-CM

## 2022-11-30 DIAGNOSIS — A419 Sepsis, unspecified organism: Secondary | ICD-10-CM | POA: Diagnosis not present

## 2022-11-30 DIAGNOSIS — J410 Simple chronic bronchitis: Secondary | ICD-10-CM | POA: Diagnosis not present

## 2022-11-30 DIAGNOSIS — T83511A Infection and inflammatory reaction due to indwelling urethral catheter, initial encounter: Secondary | ICD-10-CM

## 2022-11-30 LAB — URINALYSIS, ROUTINE W REFLEX MICROSCOPIC
Bilirubin Urine: NEGATIVE
Glucose, UA: NEGATIVE mg/dL
Ketones, ur: NEGATIVE mg/dL
Nitrite: NEGATIVE
Protein, ur: 100 mg/dL — AB
RBC / HPF: >50 RBC/hpf (ref 0–5)
Specific Gravity, Urine: 1.025 (ref 1.005–1.030)
WBC, UA: >50 WBC/hpf (ref 0–5)
pH: 5 (ref 5.0–8.0)

## 2022-11-30 LAB — GLUCOSE, CAPILLARY
Glucose-Capillary: 99 mg/dL (ref 70–99)
Glucose-Capillary: 108 mg/dL — ABNORMAL HIGH (ref 70–99)

## 2022-11-30 LAB — MAGNESIUM: Magnesium: 1.8 mg/dL (ref 1.7–2.4)

## 2022-11-30 MED ORDER — ORAL CARE MOUTH RINSE: 15.0000 mL | OROMUCOSAL | Status: DC | PRN

## 2022-11-30 MED ORDER — SODIUM CHLORIDE 0.9 % IV SOLN: INTRAVENOUS | Status: AC

## 2022-11-30 MED ORDER — LORAZEPAM 2 MG/ML IJ SOLN
0.5000 mg | Freq: Once | INTRAMUSCULAR | Status: AC
  Administered 2022-11-30: 0.5 mg via INTRAVENOUS
  Filled 2022-11-30: qty 1

## 2022-11-30 MED ORDER — ENOXAPARIN SODIUM 40 MG/0.4ML IJ SOSY
40.0000 mg | PREFILLED_SYRINGE | Freq: Every day | INTRAMUSCULAR | Status: DC
  Administered 2022-11-30 – 2022-12-05 (×6): 40 mg via SUBCUTANEOUS
  Filled 2022-11-30 (×6): qty 0.4

## 2022-11-30 MED ORDER — SODIUM CHLORIDE 0.9 % IV SOLN
2.0000 g | INTRAVENOUS | Status: DC
  Administered 2022-11-30 – 2022-12-02 (×3): 2 g via INTRAVENOUS
  Filled 2022-11-30 (×3): qty 20

## 2022-11-30 MED ORDER — HALOPERIDOL LACTATE 5 MG/ML IJ SOLN
3.0000 mg | Freq: Four times a day (QID) | INTRAMUSCULAR | Status: AC | PRN
  Administered 2022-11-30 – 2022-12-01 (×2): 3 mg via INTRAVENOUS
  Filled 2022-11-30: qty 1

## 2022-11-30 MED ORDER — HALOPERIDOL LACTATE 5 MG/ML IJ SOLN
2.0000 mg | Freq: Four times a day (QID) | INTRAMUSCULAR | Status: DC | PRN
  Administered 2022-11-30: 2 mg via INTRAVENOUS
  Filled 2022-11-30 (×2): qty 1

## 2022-11-30 MED ORDER — MELATONIN 3 MG PO TABS
3.0000 mg | ORAL_TABLET | Freq: Every day | ORAL | Status: DC
  Administered 2022-11-30 – 2022-12-02 (×2): 3 mg via ORAL
  Filled 2022-11-30 (×4): qty 1
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
## 2022-11-30 NOTE — TOC Initial Note (Addendum)
Transition of Care [facility]) - Initial/Assessment Note    Patient Details  Name: Alexander [name] MRN: [id number] Date of Birth: [birth year]  Transition of Care [facility]) CM/SW Contact:    Adrian Prows, RN Phone Number: 11/30/2022, 10:01 AM  Clinical Narrative:                 Pt has dementia; spoke w/ pt's dtr Alexander [name] [phone]); pt's dtr says she lives in home w/  him; she is his primary care giver; she says pt has walker, rolator, wheel chair, and shower chair; he has nebulizer from hospice; she plans for pt to return home at d/c; pt is followed by [facility]; awaiting PT/OT eval; TOC will follow.  Expected Discharge Plan: Home/Self Care Barriers to Discharge: Continued Medical Work up   Patient Goals and CMS Choice Patient states their goals for this hospitalization and ongoing recovery are:: Return home per pt's dtrr Alexander [name] (dtr)          Expected Discharge Plan and Services   Discharge Planning Services: CM Consult   Living arrangements for the past 2 months: Single Family Home                                      Prior Living Arrangements/Services Living arrangements for the past 2 months: Single Family Home Lives with:: Adult Children Patient language and need for interpreter reviewed:: Yes Do you feel safe going back to the place where you live?: Yes      Need for Family Participation in Patient Care: Yes (Comment) Care giver support system in place?: Yes (comment) Current home services: DME (walker, wheel chair, shower chair, nebulizer from hospice) Criminal Activity/Legal Involvement Pertinent to Current Situation/Hospitalization: No - Comment as needed  Activities of Daily Living      Permission Sought/Granted Permission sought to share information with : Case Manager Permission granted to share information with : Yes, Verbal Permission Granted  Share Information with NAME: Case Manager     Permission granted to share info w  Relationship: Alexander [name] (dtr) [phone]     Emotional Assessment Appearance:: Appears stated age Attitude/Demeanor/Rapport: Unable to Assess Affect (typically observed): Unable to Assess Orientation: :  (unable to assess) Alcohol / Substance Use: Not Applicable Psych Involvement: No (comment)  Admission diagnosis:  Sepsis secondary to UTI (HCC) [A41.9, N39.0] Fall, initial encounter [W19.XXXA] Urinary tract infection associated with indwelling urethral catheter, initial encounter (HCC) [M57.846N, N39.0] Sepsis, due to unspecified organism, unspecified whether acute organ dysfunction present [facility]) [A41.9] Patient Active Problem List   Diagnosis Date Noted   Sepsis secondary to UTI (HCC) 11/29/2022   COPD exacerbation (HCC) 11/29/2020   Acute hypoxemic respiratory failure (HCC) 11/29/2020   Moderate dementia with behavioral disturbance 11/29/2020   COVID-19 04/11/2020   Syncope and collapse 04/11/2020   Right shoulder pain 02/19/2019   Hematochezia 01/03/2019   Bilateral hearing loss 10/06/2018   Dementia without behavioral disturbance (HCC) 09/02/2018   Acute respiratory failure with hypoxia (HCC) 09/02/2018   Rash 05/30/2018   Blood in ear canal, left 05/21/2018   Dermatitis 04/12/2018   Preventative health care 11/27/2017   Weight loss 08/27/2017   Abdominal discomfort, generalized 08/27/2017   Anemia 08/27/2017   Hyponatremia 08/27/2017   Chronic obstructive pulmonary disease (HCC) 06/05/2017   Chronic pansinusitis 06/05/2017   BPH with obstruction/lower urinary tract symptoms 01/30/2017   Gynecomastia, male 09/06/2016  Chest pain 05/21/2016   Hyperglycemia 05/09/2016   Depression 05/09/2016   Rhinitis, chronic 05/09/2016   Upper airway cough syndrome 03/18/2016   Hypertensive heart disease    Dark stools 11/14/2015   Screening for prostate cancer 05/06/2015   Thrush 05/06/2015   DOE (dyspnea on exertion) 07/31/2014   Constipation 01/27/2014   Glaucoma     Erectile dysfunction    Left ventricular hypertrophy 12/01/2012   GERD (gastroesophageal reflux disease) 10/28/2012   COPD, moderate (HCC) 10/22/2012   HLD (hyperlipidemia) 10/11/2009   Essential hypertension 10/11/2009   Cough, persistent 10/11/2009   PCP:  [name], MD Pharmacy:   [id number] [place] [place] - [address] [place] [address] Phone: [phone] Fax: [phone]     Social Determinants of Health (SDOH) Social History: SDOH Screenings   Food Insecurity: No Food Insecurity (11/30/2022)  Housing: Low Risk  (11/30/2022)  Transportation Needs: No Transportation Needs (09/07/2017)  Depression (PHQ2-9): Low Risk  (12/16/2020)  Financial Resource Strain: Low Risk  (09/07/2017)  Physical Activity: Sufficiently Active (09/07/2017)  Social Connections: Socially Integrated (09/07/2017)  Stress: No Stress Concern Present (09/07/2017)  Tobacco Use: Medium Risk (10/24/2022)   SDOH Interventions: Food Insecurity Interventions: Intervention Not Indicated, Inpatient TOC Housing Interventions: Intervention Not Indicated, Inpatient TOC   Readmission Risk Interventions    11/30/2022    9:58 AM  Readmission Risk Prevention Plan  Transportation Screening Complete  PCP or Specialist Appt within 5-7 Days Complete  Home Care Screening Complete  Medication Review (RN CM) Complete

## 2022-11-30 NOTE — Progress Notes (Signed)
    Patient Name: Alexander [name]           DOB: 10/12/1932  MRN: [id number]      Admission Date: 11/29/2022  Attending Provider: Marinda Elk, MD  Primary Diagnosis: Sepsis secondary to UTI [facility])   Level of care: Stepdown    CROSS COVER NOTE   Date of Service   11/30/2022   Alexander [name], 89 y.o. male, was admitted on 11/29/2022 for Sepsis secondary to UTI [facility]).    HPI/Events of Note   Agitated, restless  PMHx of Dementia without behavioral disturbances on Risperdal and IV haldol Currently attempting to get out of bed multiple times despite having waist restraint. High fall risk.   Unfortunately, he is not following commands despite multiple attempts at redirection by staff  Patient has already received IV haldol at 5pm, nursing reports relief was short in duration. Primary care giver states that pt takes ativan without behavioral complications.     Interventions/ Plan   Will trial ativan x1   Restraint to be discontinued as soon as patient is able to safely have them removed.        Anthoney Harada, DNP, ACNPC- AG [facility]

## 2022-11-30 NOTE — Progress Notes (Signed)
[name] [address] [facility]  Hospitalized Hospice patient visit  Mr. Hokenson is a current [facility] patient with a terminal diagnosis of abnormal weight loss. He was at his day program yesterday 8.7 and fell face forward out of his wheelchair. When he arrived to the ED he was originally complaining of head and neck pain however this subsided. During his time in the ED he became tachycardic and hypoxic with some altered mental status and the decision was made to admit. He is admitted as of 8.7 with diagnosis of Sepsis/UTI/Covid. [facility] was notified after he was transported to ED. Per Dr. Patric Dykes with [facility] this is a related hospital admission.   Visited in hospital, patient is on isolation due to +Covid diagnosis. His family has been staying with him but they are not present currently. Exchanged report with bedside nurse who reports patient has been very active and attempting to move this morning and he is just now calming down. No apparent symptoms of distress.   Patient is inpatient appropriate due to need for IVAB, higher level skilled monitoring related to recent diagnosis.   Vital Signs-  98.9/115/21    132/99   92% on 2L nasal cannula I&O- 1738/525 Abnormal Labs- Albumin 3.1, BNP 121.1, WBC 16.6, Hgb 10.8, Hct 34.2, +UTI Diagnostics-  Head & Chest CT and CXR all within normal limits  IV/PRN Meds- Decadron 6mg  IV q24H, NS 75cc/H IV, Rocephin 2g IV q24H, LR 75mg  IV, Remdesivir 200mg  once IV then 100mg  IV daily, Haldol 3mg  IM injection x2  Problem List- Sepsis of unclear source: The sepsis physiology could also be due to COVID-19, is now resolved. Admitted to stepdown and started on IV fluids and empiric antibiotics for possible UTI. UA was inconclusive. Urine cultures were sent and repeat a UA. Blood cultures have been sent. Blood pressure is now stable. Previous 2D echo in 2021 showed diastolic dysfunction so we will be judicious with IV fluids monitor  strict I's and O's and daily weights. Out of bed to chair consult physical therapy.  Continues inspirometry and flutter valve.   COVID-19 infection superimposed on COPD: Continue supplemental oxygen he was hypoxic in the ED. Continue inhalers incentive spirometry and flutter valve. Out of bed to chair consult physical therapy. Started on remdesivir per pharmacy continue monitor electrolytes and replete as needed.   Dementia without behavioral disturbances: Continue Risperdal at bedtime, Haldol IV as needed for agitation he is at high risk of aspiration.   Delirium/acute confusional state: Continue Risperdal at night Haldol IV as needed. Try to keep up with her lites stable magnesium greater than 2, potassium greater than 4   Discharge Planning- Ongoing, likely back home when stable Family Contact- Talked with daughter [name] by phone IDT: updated Goals of Care: Patient is full code   Transfer summary and med list placed on patient's shadow chart.  Should patient require ambulance transport home please use [facility] as we contract this service for our current patients.   Please don't hesitate to call for any hospice related questions or concerns.  Thea Gist BSN, [facility] [phone]

## 2022-11-30 NOTE — Progress Notes (Signed)
    Patient Name: JEVANTE MCCAUSLIN           DOB: 21-Mar-1933  MRN: [id number]      Admission Date: 11/29/2022  Attending Provider: [name], MD  Primary Diagnosis: Sepsis secondary to UTI [facility])   Level of care: Stepdown    CROSS COVER NOTE   Date of Service   11/30/2022   BIRDIE GROTHAUS, 89 y.o. male, was admitted on 11/29/2022 for Sepsis secondary to UTI [facility]).    HPI/Events of Note   Hypotensive, BP 80/40s with MAP mid 50s. No other acute changes reported.  Other vital signs stable.  Asymptomatic. No renal, GI, acute blood loss reported. Elevated lactic acid 2.1 in setting of sepsis secondary to UTI, will recheck lactic acid.   Interventions/ Plan   Fluid bolus, 500 cc Lactic acid check-negative Nursing staff to report if SBP < 90 or MAP<65        Anthoney Harada, DNP, [employer]- AG [facility] [employer]

## 2022-11-30 NOTE — Progress Notes (Signed)
PHARMACY - PHYSICIAN COMMUNICATION CRITICAL VALUE ALERT - BLOOD CULTURE IDENTIFICATION (BCID)  Alexander [name] is an 89 y.o. male who presented to [facility] on 11/29/2022 with a chief complaint of hypotension, sepsis.    Assessment:  Currently treating for sepsis of unclear source, possible UTI, COVID.  Blood cultures 1/3 bottles with GNR, BCID shows Klebsiella pneumoniae without resistance.  Name of physician (or Provider) Contacted: Dr David Stall  Current antibiotics: Ceftriaxone 1g IV q24h   Changes to prescribed antibiotics recommended:  Ceftriaxone increased to 2g IV q24h Patient is on recommended antibiotics - No changes needed  Results for orders placed or performed during the hospital encounter of 11/29/22  Blood Culture ID Panel (Reflexed) (Collected: 11/29/2022  3:01 PM)  Result Value Ref Range   Enterococcus faecalis NOT DETECTED NOT DETECTED   Enterococcus Faecium NOT DETECTED NOT DETECTED   Listeria monocytogenes NOT DETECTED NOT DETECTED   Staphylococcus species NOT DETECTED NOT DETECTED   Staphylococcus aureus (BCID) NOT DETECTED NOT DETECTED   Staphylococcus epidermidis NOT DETECTED NOT DETECTED   Staphylococcus lugdunensis NOT DETECTED NOT DETECTED   Streptococcus species NOT DETECTED NOT DETECTED   Streptococcus agalactiae NOT DETECTED NOT DETECTED   Streptococcus pneumoniae NOT DETECTED NOT DETECTED   Streptococcus pyogenes NOT DETECTED NOT DETECTED   A.calcoaceticus-baumannii NOT DETECTED NOT DETECTED   Bacteroides fragilis NOT DETECTED NOT DETECTED   Enterobacterales DETECTED (A) NOT DETECTED   Enterobacter cloacae complex NOT DETECTED NOT DETECTED   Escherichia coli NOT DETECTED NOT DETECTED   Klebsiella aerogenes NOT DETECTED NOT DETECTED   Klebsiella oxytoca NOT DETECTED NOT DETECTED   Klebsiella pneumoniae DETECTED (A) NOT DETECTED   Proteus species NOT DETECTED NOT DETECTED   Salmonella species NOT DETECTED NOT DETECTED   Serratia marcescens NOT  DETECTED NOT DETECTED   Haemophilus influenzae NOT DETECTED NOT DETECTED   Neisseria meningitidis NOT DETECTED NOT DETECTED   Pseudomonas aeruginosa NOT DETECTED NOT DETECTED   Stenotrophomonas maltophilia NOT DETECTED NOT DETECTED   Candida albicans NOT DETECTED NOT DETECTED   Candida auris NOT DETECTED NOT DETECTED   Candida glabrata NOT DETECTED NOT DETECTED   Candida krusei NOT DETECTED NOT DETECTED   Candida parapsilosis NOT DETECTED NOT DETECTED   Candida tropicalis NOT DETECTED NOT DETECTED   Cryptococcus neoformans/gattii NOT DETECTED NOT DETECTED   CTX-M ESBL NOT DETECTED NOT DETECTED   Carbapenem resistance IMP NOT DETECTED NOT DETECTED   Carbapenem resistance KPC NOT DETECTED NOT DETECTED   Carbapenem resistance NDM NOT DETECTED NOT DETECTED   Carbapenem resist OXA 48 LIKE NOT DETECTED NOT DETECTED   Carbapenem resistance VIM NOT DETECTED NOT DETECTED    Lynann Beaver PharmD, BCPS [facility] main pharmacy [phone] 11/30/2022  9:40 AM

## 2022-12-01 ENCOUNTER — Encounter (HOSPITAL_COMMUNITY): Payer: Self-pay | Admitting: *Deleted

## 2022-12-01 DIAGNOSIS — N390 Urinary tract infection, site not specified: Secondary | ICD-10-CM | POA: Diagnosis not present

## 2022-12-01 DIAGNOSIS — J9601 Acute respiratory failure with hypoxia: Secondary | ICD-10-CM | POA: Diagnosis not present

## 2022-12-01 DIAGNOSIS — R7881 Bacteremia: Secondary | ICD-10-CM

## 2022-12-01 DIAGNOSIS — J410 Simple chronic bronchitis: Secondary | ICD-10-CM | POA: Diagnosis not present

## 2022-12-01 DIAGNOSIS — B961 Klebsiella pneumoniae [K. pneumoniae] as the cause of diseases classified elsewhere: Secondary | ICD-10-CM

## 2022-12-01 DIAGNOSIS — A419 Sepsis, unspecified organism: Secondary | ICD-10-CM | POA: Diagnosis not present

## 2022-12-01 LAB — GLUCOSE, CAPILLARY: Glucose-Capillary: 99 mg/dL (ref 70–99)

## 2022-12-01 MED ORDER — HALOPERIDOL LACTATE 5 MG/ML IJ SOLN
5.0000 mg | Freq: Four times a day (QID) | INTRAMUSCULAR | Status: AC | PRN
  Administered 2022-12-01 (×2): 5 mg via INTRAVENOUS
  Filled 2022-12-01 (×2): qty 1

## 2022-12-01 MED ORDER — RISPERIDONE 1 MG PO TABS
1.0000 mg | ORAL_TABLET | Freq: Every day | ORAL | Status: DC
  Administered 2022-12-01: 1 mg via ORAL
  Filled 2022-12-01 (×2): qty 1

## 2022-12-01 MED ORDER — METOPROLOL TARTRATE 25 MG PO TABS
25.0000 mg | ORAL_TABLET | Freq: Two times a day (BID) | ORAL | Status: DC
  Administered 2022-12-01: 25 mg via ORAL
  Filled 2022-12-01: qty 1

## 2022-12-01 MED ORDER — METOPROLOL TARTRATE 5 MG/5ML IV SOLN: 5.0000 mg | Freq: Four times a day (QID) | INTRAVENOUS | Status: DC | PRN

## 2022-12-01 MED ORDER — METOPROLOL TARTRATE 12.5 MG HALF TABLET
12.5000 mg | ORAL_TABLET | Freq: Two times a day (BID) | ORAL | Status: DC
Start: 2022-12-01 — End: 2022-12-01
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
## 2022-12-01 NOTE — Progress Notes (Signed)
   [name] [address] [facility]  Hospitalized Hospice patient visit:   Alexander [name] is a current [facility] patient with a terminal diagnosis of abnormal weight loss. He was at his day program yesterday 8.7 and fell face forward out of his wheelchair. When he arrived to the ED he was originally complaining of head and neck pain however this subsided. During his time in the ED he became tachycardic and hypoxic with some altered mental status and the decision was made to admit. He is admitted as of 8.7 with diagnosis of Sepsis/UTI/Covid. [facility] was notified after he was transported to ED. Per Dr. Patric Dykes with [facility] this is a related hospital admission.    Exchanged report with Bedside RNs who stated patient more alert with slight confusion with slight cough on 2L O2 via [place] and continues to be on Covid Isolation. Plan is to transfer patient to Med Surg bed today.  Patient being fed at time of visit with no family at side, in NAD distress being cooperative with no agitation noted at this time.   Patient is inpatient appropriate due to need for IV medication and higher level skilled monitoring related to recent diagnosis.    Vital Signs-  98.2, 63, 21, 126/79, 100% on 2L nasal cannula I&O- 508.9/225 (+283.7) Abnormal Labs- from 8.8.24 Albumin 3.1, BNP 121.1, WBC 16.6, Hgb 10.8, Hct 34.2, +UTI Diagnostics-  CT of the head showed no acute findings CT cervical spine showed no traumatic fractures or dislocation chronic degenerative disease. CXR showed no acute cardiopulmonary dx.     IV/PRN Meds- Decadron 6mg  IV q24H, NS 75cc/H IV   Hospitalist EPIC Problem List- Sepsis likely due to Klebsiella bacteremia The sepsis physiology is now resolved. Vital signs stabilized, continue IV Rocephin. Has remained afebrile. Blood cultures + for klebsiella Out of bed to chair consult physical therapy.  Continues inspirometry and flutter valve. Transferred to MedSurg.   COVID-19  infection superimposed on COPD: Has been weaned to room air when I went to the room he is off oxygen breathing comfortably asking for food. Out of bed to chair consult physical therapy. Continue remdesivir. Continue flutter valve and incentive spirometry.    Dementia without behavioral disturbances: Continue Risperdal at bedtime, Haldol IV as needed for agitation he is at high risk of aspiration.    Delirium/acute confusional state: Continue Risperdal at night Haldol IV as needed. Try to keep up with her lites stable magnesium greater than 2, potassium greater than 4     Discharge Planning- Ongoing Assessment: Patient daughter plans to have patient return back home with hospice upon discharge when medically stable. Please Use [facility] for all [facility] patient's transportation needs.  Family Contact- Spoke to patient's daughter via telephone to support. Family is seeking information about more community resources to support family in the home so will made our homecare SW and [facility] aware of these needs. Family is aware that Civil engineer, contracting HLT will make daily visit during this hospitalization. Family has Academic librarian and encouraged to call for any hospice needs.  IDT: updated [facility] Team on patient condition.  Goals of Care: Patient is full code on hospice support in the daughter's home.    Please reach out for any hospice related questions or concerns,   Roda Shutters, [facility] Liaison (on [place]) [phone]

## 2022-12-01 NOTE — Progress Notes (Signed)
PT Cancellation Note  Patient Details Name: Alexander [name] MRN: [id number] DOB: November 25, 1932   Cancelled Treatment:    Reason Eval/Treat Not Completed: Other (comment) Noted pt has been agitated, received Ativan last night, and has restraints in place.  Spoke with RN who recommended to hold PT due to pt's agitation/restraints.  Will f/u later date. Anise Salvo, PT Acute Rehab [facility] [phone]   Rayetta Humphrey 12/01/2022, 11:04 AM

## 2022-12-01 NOTE — Progress Notes (Signed)
Noon assessment completed. Patient has been agitated. Despite mitten placement pt has continued to be successful, pulling IV line, telemetry leads and pulse oximeter probe. Hospitalist provider contacted to assist. See new orders.

## 2022-12-01 NOTE — TOC Progression Note (Signed)
Transition of Care [facility]) - Progression Note    Patient Details  Name: Alexander [name] MRN: [id number] Date of Birth: [birth year]  Transition of [facility]) CM/SW Contact  Adrian Prows, RN Phone Number: 12/01/2022, 2:50 PM  Clinical Narrative:    Notified by [facility] Liaison for [facility] that pt's dtr would like info for private duty care givers; LVM for [name] list placed on window seat of pt's room.   Expected Discharge Plan: Home/Self Care Barriers to Discharge: Continued Medical Work up  Expected Discharge Plan and Services   Discharge Planning Services: CM Consult   Living arrangements for the past 2 months: Single Family Home                                       Social Determinants of Health (SDOH) Interventions SDOH Screenings   Food Insecurity: No Food Insecurity (11/30/2022)  Housing: Low Risk  (11/30/2022)  Transportation Needs: No Transportation Needs (09/07/2017)  Depression (PHQ2-9): Low Risk  (12/16/2020)  Financial Resource Strain: Low Risk  (09/07/2017)  Physical Activity: Sufficiently Active (09/07/2017)  Social Connections: Socially Integrated (09/07/2017)  Stress: No Stress Concern Present (09/07/2017)  Tobacco Use: Medium Risk (10/24/2022)    Readmission Risk Interventions    11/30/2022    9:58 AM  Readmission Risk Prevention Plan  Transportation Screening Complete  PCP or Specialist Appt within 5-7 Days Complete  Home Care Screening Complete  Medication Review (RN CM) Complete

## 2022-12-01 NOTE — Progress Notes (Addendum)
HR sustaining 120-125 , Hospitalist contacted. See new orders.V.O received to administer Metoprolol  25 mg PO now.

## 2022-12-02 DIAGNOSIS — N390 Urinary tract infection, site not specified: Secondary | ICD-10-CM | POA: Diagnosis not present

## 2022-12-02 DIAGNOSIS — A419 Sepsis, unspecified organism: Secondary | ICD-10-CM | POA: Diagnosis not present

## 2022-12-02 DIAGNOSIS — J9601 Acute respiratory failure with hypoxia: Secondary | ICD-10-CM | POA: Diagnosis not present

## 2022-12-02 DIAGNOSIS — J410 Simple chronic bronchitis: Secondary | ICD-10-CM | POA: Diagnosis not present

## 2022-12-02 LAB — GLUCOSE, CAPILLARY
Glucose-Capillary: 108 mg/dL — ABNORMAL HIGH (ref 70–99)
Glucose-Capillary: 128 mg/dL — ABNORMAL HIGH (ref 70–99)
Glucose-Capillary: 107 mg/dL — ABNORMAL HIGH (ref 70–99)
Glucose-Capillary: 91 mg/dL (ref 70–99)

## 2022-12-02 MED ORDER — HALOPERIDOL LACTATE 5 MG/ML IJ SOLN
5.0000 mg | Freq: Four times a day (QID) | INTRAMUSCULAR | Status: AC | PRN
  Administered 2022-12-02 (×2): 5 mg via INTRAVENOUS
  Filled 2022-12-02 (×2): qty 1

## 2022-12-02 MED ORDER — METOPROLOL TARTRATE 12.5 MG HALF TABLET
12.5000 mg | ORAL_TABLET | Freq: Two times a day (BID) | ORAL | Status: DC
  Administered 2022-12-02 – 2022-12-05 (×6): 12.5 mg via ORAL
  Filled 2022-12-02 (×7): qty 1

## 2022-12-02 MED ORDER — ORAL CARE MOUTH RINSE
15.0000 mL | OROMUCOSAL | Status: DC
  Administered 2022-12-03 – 2022-12-05 (×8): 15 mL via OROMUCOSAL

## 2022-12-02 MED ORDER — HALOPERIDOL LACTATE 5 MG/ML IJ SOLN: 3.0000 mg | Freq: Four times a day (QID) | INTRAMUSCULAR | Status: DC | PRN

## 2022-12-02 MED ORDER — ENSURE ENLIVE PO LIQD
237.0000 mL | Freq: Two times a day (BID) | ORAL | Status: DC
  Administered 2022-12-02 – 2022-12-04 (×5): 237 mL via ORAL

## 2022-12-02 MED ORDER — RISPERIDONE 1 MG PO TABS
2.0000 mg | ORAL_TABLET | Freq: Every day | ORAL | Status: DC
  Administered 2022-12-03 – 2022-12-04 (×2): 2 mg via ORAL
  Filled 2022-12-02 (×3): qty 2

## 2022-12-02 NOTE — Plan of Care (Signed)
  Problem: Education: Goal: Knowledge of General Education information will improve Description: Including pain rating scale, medication(s)/side effects and non-pharmacologic comfort measures Outcome: Not Progressing   Problem: Health Behavior/Discharge Planning: Goal: Ability to manage health-related needs will improve Outcome: Not Progressing   Problem: Clinical Measurements: Goal: Ability to maintain clinical measurements within normal limits will improve Outcome: Not Progressing Goal: Will remain free from infection Outcome: Not Progressing Goal: Diagnostic test results will improve Outcome: Not Progressing Goal: Respiratory complications will improve Outcome: Not Progressing Goal: Cardiovascular complication will be avoided Outcome: Not Progressing   Problem: Activity: Goal: Risk for activity intolerance will decrease Outcome: Not Progressing   Problem: Nutrition: Goal: Adequate nutrition will be maintained Outcome: Progressing   Problem: Coping: Goal: Level of anxiety will decrease Outcome: Not Progressing   Problem: Elimination: Goal: Will not experience complications related to bowel motility Outcome: Progressing Goal: Will not experience complications related to urinary retention Outcome: Not Progressing   Problem: Pain Managment: Goal: General experience of comfort will improve Outcome: Progressing   Problem: Safety: Goal: Ability to remain free from injury will improve Outcome: Not Progressing   Problem: Skin Integrity: Goal: Risk for impaired skin integrity will decrease Outcome: Not Progressing   Problem: Education: Goal: Knowledge of risk factors and measures for prevention of condition will improve Outcome: Not Progressing   Problem: Coping: Goal: Psychosocial and spiritual needs will be supported Outcome: Not Progressing   Problem: Respiratory: Goal: Will maintain a patent airway Outcome: Progressing Goal: Complications related to the  disease process, condition or treatment will be avoided or minimized Outcome: Not Progressing   Problem: Education: Goal: Knowledge of risk factors and measures for prevention of condition will improve Outcome: Not Progressing   Problem: Coping: Goal: Psychosocial and spiritual needs will be supported Outcome: Not Progressing   Problem: Respiratory: Goal: Will maintain a patent airway Outcome: Progressing Goal: Complications related to the disease process, condition or treatment will be avoided or minimized Outcome: Not Progressing   Problem: Fluid Volume: Goal: Hemodynamic stability will improve Outcome: Progressing   Problem: Clinical Measurements: Goal: Diagnostic test results will improve Outcome: Not Progressing Goal: Signs and symptoms of infection will decrease Outcome: Not Progressing   Problem: Respiratory: Goal: Ability to maintain adequate ventilation will improve Outcome: Progressing   Problem: Education: Goal: Ability to describe self-care measures that may prevent or decrease complications (Diabetes Survival Skills Education) will improve Outcome: Progressing Goal: Individualized Educational Video(s) Outcome: Progressing   Problem: Coping: Goal: Ability to adjust to condition or change in health will improve Outcome: Not Progressing   Problem: Fluid Volume: Goal: Ability to maintain a balanced intake and output will improve Outcome: Progressing   Problem: Health Behavior/Discharge Planning: Goal: Ability to identify and utilize available resources and services will improve Outcome: Not Progressing Goal: Ability to manage health-related needs will improve Outcome: Not Progressing   Problem: Metabolic: Goal: Ability to maintain appropriate glucose levels will improve Outcome: Progressing   Problem: Nutritional: Goal: Maintenance of adequate nutrition will improve Outcome: Progressing Goal: Progress toward achieving an optimal weight will  improve Outcome: Not Progressing   Problem: Skin Integrity: Goal: Risk for impaired skin integrity will decrease Outcome: Not Progressing   Problem: Tissue Perfusion: Goal: Adequacy of tissue perfusion will improve Outcome: Not Progressing   Problem: Safety: Goal: Non-violent Restraint(s) Outcome: Not Progressing Patient total care alert to self only follows some commands in bilateral restraints and mitts and soft wrist belt

## 2022-12-02 NOTE — Progress Notes (Signed)
[facility] PROGRESS NOTE    Progress Note  Alexander [name]  FAO:[id number] DOB: Aug 18, 1932 DOA: 11/29/2022 PCP: [name], MD     Brief Narrative:   Alexander [name] is an 89 y.o. male past medical history of Alzheimer's, anxiety BPH nonobstructive CAD hypertensive heart disease constipation brought in from [facility] due to hypotension, he was placed in a chair and then fell, but no loss of consciousness in the ED he was found to have a lactic acid of 2.7 white count of 16.6 with a left shift SARS-CoV-2 PCR was positive, CT of the head showed no acute findings CT cervical spine showed no traumatic fractures or dislocation chronic degenerative disease chest x-ray showed no acute cardiopulmonary disease.  Spiked a fever in the ED with a heart rate of 124 responded to acetaminophen was started empirically on Rocephin and IV fluids  Assessment/Plan:   Sepsis likely due to Klebsiella bacteremia The sepsis physiology is now resolved. Continue IV Rocephin. Has remained afebrile. Blood cultures + for klebsiella, sensitivities are pending. Out of bed to chair consult physical therapy.  Continues inspirometry and flutter valve.  COVID-19 infection superimposed on COPD: Has been weaned to room air when I went to the room he is off oxygen breathing comfortably asking for food. Out of bed to chair consult physical therapy. Continue remdesivir. Continue flutter valve and incentive spirometry.  GERD: Continue antiacid and PPIs.  Depression: Continue citalopram and trazodone.  Dementia with behavioral disturbances: Continue Risperdal at bedtime. He has been significantly agitated overnight required a higher dose of risperidone and IV Haldol several times. Had to be placed in restraints.  Increase Haldol will have talked with family about comfort measures.  Hyperlipidemia: Follow-up with PCP.  Essential hypertension: On no antihypertensive medications at  home.  Delirium/acute confusional state: Continue Risperdal at night Haldol IV as needed. Try to keep up with her lites stable magnesium greater than 2, potassium greater than 4   DVT prophylaxis: lovenox Family Communication:none Status is: Inpatient Remains inpatient appropriate because: Acute respiratory failure with hypoxia possibly due to COVID-19    Code Status:     Code Status Orders  (From admission, onward)           Start     Ordered   11/29/22 1704  Full code  Continuous       Question:  By:  Answer:  Consent: discussion documented in EHR   11/29/22 1703           Code Status History     Date Active Date Inactive Code Status Order ID Comments User Context   11/29/2020 0601 11/30/2020 1749 Full Code [id number]  [name], MD ED   04/11/2020 1417 04/13/2020 1410 Full Code [id number]  [name], MD ED   09/03/2018 0140 09/03/2018 1209 Full Code [id number]  [name], MD Inpatient   02/28/2013 1115 03/02/2013 1913 Full Code [id number]  [name], MD Inpatient         IV Access:   Peripheral IV   Procedures and diagnostic studies:   No results found.   Medical Consultants:   None.   Subjective:    Alexander [name] be this morning as he received several doses of Haldol overnight as he was agitated.  Objective:    Vitals:   12/02/22 0000 12/02/22 0341 12/02/22 0448 12/02/22 0500  BP: 98/61  (!) 155/73   Pulse: (!) 59 76 82   Resp: 17 19 19    Temp:  Marland Kitchen)  97.4 F (36.3 C)    TempSrc:  Axillary    SpO2: 98% 90% 96%   Weight:    60.4 kg  Height:       SpO2: 96 % O2 Flow Rate (L/min): 2 L/min   Intake/Output Summary (Last 24 hours) at 12/02/2022 0702 Last data filed at 12/02/2022 0341 Gross per 24 hour  Intake 778.67 ml  Output 1000 ml  Net -221.33 ml   Filed Weights   11/29/22 1700 12/01/22 0500 12/02/22 0500  Weight: 57.3 kg 59.7 kg 60.4 kg    Exam: General exam: In no acute distress. Respiratory  system: Good air movement and clear to auscultation. Cardiovascular system: S1 & S2 heard, RRR. No JVD. Gastrointestinal system: Abdomen is nondistended, soft and nontender.  Extremities: No pedal edema. Skin: No rashes, lesions or ulcers  Data Reviewed:    Labs: Basic Metabolic Panel: Recent Labs  Lab 11/29/22 1409 11/30/22 0312  NA 140 140  K 3.7 4.0  CL 103 104  CO2 28 28  GLUCOSE 109* 112*  BUN 21 20  CREATININE 0.87 0.69  CALCIUM 9.0 9.0  MG  --  1.8   GFR Estimated Creatinine Clearance: 55.6 mL/min (by C-G formula based on SCr of 0.69 mg/dL). Liver Function Tests: Recent Labs  Lab 11/30/22 0312  AST 17  ALT 12  ALKPHOS 51  BILITOT 0.7  PROT 5.7*  ALBUMIN 3.1*   No results for input(s): "LIPASE", "AMYLASE" in the last 168 hours. No results for input(s): "AMMONIA" in the last 168 hours. Coagulation profile Recent Labs  Lab 11/29/22 1500  INR 1.2   COVID-19 Labs  No results for input(s): "DDIMER", "FERRITIN", "LDH", "CRP" in the last 72 hours.  Lab Results  Component Value Date   SARSCOV2NAA POSITIVE (A) 11/29/2022   SARSCOV2NAA NEGATIVE 11/28/2020   SARSCOV2NAA POSITIVE (A) 04/11/2020   SARSCOV2NAA NEGATIVE 07/09/2019    CBC: Recent Labs  Lab 11/29/22 1409 11/30/22 0312  WBC 14.0* 16.6*  NEUTROABS 12.0* 15.0*  HGB 11.7* 10.8*  HCT 38.0* 34.2*  MCV 97.9 97.7  PLT 247 210   Cardiac Enzymes: No results for input(s): "CKTOTAL", "CKMB", "CKMBINDEX", "TROPONINI" in the last 168 hours. BNP (last 3 results) No results for input(s): "PROBNP" in the last 8760 hours. CBG: Recent Labs  Lab 11/30/22 0732 11/30/22 1219 11/30/22 1613 11/30/22 1947 12/01/22 0750  GLUCAP 99 108* 144* 105* 99   D-Dimer: No results for input(s): "DDIMER" in the last 72 hours. Hgb A1c: Recent Labs    11/30/22 0312  HGBA1C 5.3   Lipid Profile: No results for input(s): "CHOL", "HDL", "LDLCALC", "TRIG", "CHOLHDL", "LDLDIRECT" in the last 72 hours. Thyroid  function studies: No results for input(s): "TSH", "T4TOTAL", "T3FREE", "THYROIDAB" in the last 72 hours.  Invalid input(s): "FREET3" Anemia work up: No results for input(s): "VITAMINB12", "FOLATE", "FERRITIN", "TIBC", "IRON", "RETICCTPCT" in the last 72 hours. Sepsis Labs: Recent Labs  Lab 11/29/22 1409 11/29/22 1511 11/29/22 1747 11/29/22 2247 11/30/22 0312  WBC 14.0*  --   --   --  16.6*  LATICACIDVEN  --  2.7* 2.1* 0.9  --    Microbiology Recent Results (from the past 240 hour(s))  Urine Culture     Status: Abnormal (Preliminary result)   Collection Time: 11/29/22 12:58 PM   Specimen: Urine, Catheterized  Result Value Ref Range Status   Specimen Description   Final    URINE, CATHETERIZED Performed at [facility], [address] [place], [place] [address]  Special Requests   Final    NONE Reflexed from [id number] Performed at [facility], [address], [place], [place] [address]    Culture (A)  Final    >=100,000 COLONIES/mL KLEBSIELLA PNEUMONIAE SUSCEPTIBILITIES TO FOLLOW Performed at [facility], [address], [place], [place] [address]    Report Status PENDING  Incomplete  SARS Coronavirus 2 by RT PCR (hospital order, performed in [facility] hospital lab) *cepheid single result test* Anterior Nasal Swab     Status: Abnormal   Collection Time: 11/29/22  2:49 PM   Specimen: Anterior Nasal Swab  Result Value Ref Range Status   SARS Coronavirus 2 by RT PCR POSITIVE (A) NEGATIVE Final    Comment: (NOTE) SARS-CoV-2 target nucleic acids are DETECTED  SARS-CoV-2 RNA is generally detectable in upper respiratory specimens  during the acute phase of infection.  Positive results are indicative  of the presence of the identified virus, but do not rule out bacterial infection or co-infection with other pathogens not detected by the test.  Clinical correlation with patient history and  other diagnostic information is necessary to determine  patient infection status.  The expected result is negative.  Fact Sheet for Patients:   RoadLapTop.co.za   Fact Sheet for Healthcare Providers:   http://kim-miller.com/    This test is not yet approved or cleared by the [place] FDA and  has been authorized for detection and/or diagnosis of SARS-CoV-2 by FDA under an Emergency Use Authorization (EUA).  This EUA will remain in effect (meaning this test can be used) for the duration of  the COVID-19 declaration under Section 564(b)(1)  of the Act, 21 U.S.C. section 360-bbb-3(b)(1), unless the authorization is terminated or revoked sooner.   Performed at [facility], [address], [place], [place] [address]   Blood Culture (routine x 2)     Status: Abnormal (Preliminary result)   Collection Time: 11/29/22  3:01 PM   Specimen: BLOOD LEFT FOREARM  Result Value Ref Range Status   Specimen Description BLOOD LEFT FOREARM  Final   Special Requests   Final    BOTTLES DRAWN AEROBIC ONLY Blood Culture adequate volume   Culture  Setup Time   Final    GRAM NEGATIVE RODS ANAEROBIC BOTTLE ONLY Organism ID to follow CRITICAL RESULT CALLED TO, READ BACK BY AND VERIFIED WITH: Ellouise Newer, AT 0911 11/30/22 D. Leighton Roach    Culture (A)  Final    KLEBSIELLA PNEUMONIAE SUSCEPTIBILITIES TO FOLLOW Performed at [facility], [address], [place], [place] [address]    Report Status PENDING  Incomplete  Blood Culture ID Panel (Reflexed)     Status: Abnormal   Collection Time: 11/29/22  3:01 PM  Result Value Ref Range Status   Enterococcus faecalis NOT DETECTED NOT DETECTED Final   Enterococcus Faecium NOT DETECTED NOT DETECTED Final   Listeria monocytogenes NOT DETECTED NOT DETECTED Final   Staphylococcus species NOT DETECTED NOT DETECTED Final   Staphylococcus aureus (BCID) NOT DETECTED NOT DETECTED Final   Staphylococcus epidermidis NOT DETECTED NOT DETECTED  Final   Staphylococcus lugdunensis NOT DETECTED NOT DETECTED Final   Streptococcus species NOT DETECTED NOT DETECTED Final   Streptococcus agalactiae NOT DETECTED NOT DETECTED Final   Streptococcus pneumoniae NOT DETECTED NOT DETECTED Final   Streptococcus pyogenes NOT DETECTED NOT DETECTED Final   A.calcoaceticus-baumannii NOT DETECTED NOT DETECTED Final   Bacteroides fragilis NOT DETECTED NOT DETECTED Final   Enterobacterales DETECTED (  A) NOT DETECTED Final    Comment: Enterobacterales represent a large order of gram negative bacteria, not a single organism. CRITICAL RESULT CALLED TO, READ BACK BY AND VERIFIED WITH: Damaris Hippo PHARMD, AT 0911 11/30/22 D. VANHOOK    Enterobacter cloacae complex NOT DETECTED NOT DETECTED Final   Escherichia coli NOT DETECTED NOT DETECTED Final   Klebsiella aerogenes NOT DETECTED NOT DETECTED Final   Klebsiella oxytoca NOT DETECTED NOT DETECTED Final   Klebsiella pneumoniae DETECTED (A) NOT DETECTED Final    Comment: CRITICAL RESULT CALLED TO, READ BACK BY AND VERIFIED WITH: Damaris Hippo PHARMD, AT 0911 11/30/22 D. VANHOOK    Proteus species NOT DETECTED NOT DETECTED Final   Salmonella species NOT DETECTED NOT DETECTED Final   Serratia marcescens NOT DETECTED NOT DETECTED Final   Haemophilus influenzae NOT DETECTED NOT DETECTED Final   Neisseria meningitidis NOT DETECTED NOT DETECTED Final   Pseudomonas aeruginosa NOT DETECTED NOT DETECTED Final   Stenotrophomonas maltophilia NOT DETECTED NOT DETECTED Final   Candida albicans NOT DETECTED NOT DETECTED Final   Candida auris NOT DETECTED NOT DETECTED Final   Candida glabrata NOT DETECTED NOT DETECTED Final   Candida krusei NOT DETECTED NOT DETECTED Final   Candida parapsilosis NOT DETECTED NOT DETECTED Final   Candida tropicalis NOT DETECTED NOT DETECTED Final   Cryptococcus neoformans/gattii NOT DETECTED NOT DETECTED Final   CTX-M ESBL NOT DETECTED NOT DETECTED Final   Carbapenem resistance IMP NOT  DETECTED NOT DETECTED Final   Carbapenem resistance KPC NOT DETECTED NOT DETECTED Final   Carbapenem resistance NDM NOT DETECTED NOT DETECTED Final   Carbapenem resist OXA 48 LIKE NOT DETECTED NOT DETECTED Final   Carbapenem resistance VIM NOT DETECTED NOT DETECTED Final    Comment: Performed at [facility], [address], [place], [place] [address]  Blood Culture (routine x 2)     Status: None (Preliminary result)   Collection Time: 11/29/22  3:47 PM   Specimen: BLOOD  Result Value Ref Range Status   Specimen Description   Final    BLOOD LEFT ANTECUBITAL Performed at [facility], [address] [place], [place] [address]    Special Requests   Final    BOTTLES DRAWN AEROBIC AND ANAEROBIC Blood Culture adequate volume Performed at [facility], [address], [place], [place] [address]    Culture  Setup Time   Final    GRAM NEGATIVE RODS ANAEROBIC BOTTLE ONLY CRITICAL VALUE NOTED.  VALUE IS CONSISTENT WITH PREVIOUSLY REPORTED AND CALLED VALUE. Performed at [facility], [address] [place], [place] [address]    Culture GRAM NEGATIVE RODS  Final   Report Status PENDING  Incomplete  MRSA Next Gen by PCR, Nasal     Status: None   Collection Time: 11/29/22  5:02 PM   Specimen: Nasal Mucosa; Nasal Swab  Result Value Ref Range Status   MRSA by PCR Next Gen NOT DETECTED NOT DETECTED Final    Comment: (NOTE) The GeneXpert MRSA Assay (FDA approved for NASAL specimens only), is one component of a comprehensive MRSA colonization surveillance program. It is not intended to diagnose MRSA infection nor to guide or monitor treatment for MRSA infections. Test performance is not FDA approved in patients less than 31 years old. Performed at [facility], [address], [place], [place] [address]      Medications:    Chlorhexidine Gluconate Cloth  6 each Topical Daily   dexamethasone (  DECADRON) injection  6 mg Intravenous  Q24H   enoxaparin (LOVENOX) injection  40 mg Subcutaneous Daily   escitalopram  20 mg Oral Daily   insulin aspart  0-20 Units Subcutaneous TID WC   melatonin  3 mg Oral QHS   metoprolol tartrate  25 mg Oral BID   risperiDONE  1 mg Oral q1800   traZODone  100 mg Oral Daily   Continuous Infusions:  cefTRIAXone (ROCEPHIN)  IV Stopped (12/01/22 1124)      LOS: 3 days   Marinda Elk  [facility]  12/02/2022, 7:02 AM

## 2022-12-02 NOTE — Progress Notes (Addendum)
0745 patient alert to self and birth date only unable to tell me kids names follows some commands in bilateral wrist restraints and mitts also in posey soft waist belt patient trying to pull off mitts with redirection unable to completely redirect. ROM completed left thumb abrasion noted from prior shift.  0800 patient son called updated on patient care and plan of care for covid

## 2022-12-02 NOTE — Evaluation (Signed)
Physical Therapy Evaluation Patient Details Name: Alexander [name] MRN: [id number] DOB: [birth year] Today's Date: 12/02/2022  History of Present Illness  89 yo male admitted with bacteremia, sepsis after sustaining a fall. Hx of COPD, alz dementia, CAD. Pt is on home hospice  Clinical Impression  Limited bed level eval only. Pt currently in restraints and mittens. When awakened, he is mildly agitated-keeps trying to remove mittens despite cues. He does follow verbal + demonstrative cues during attempt at UE/LE assessment. OOB deferred for safety reasons at this time. Will continue to follow and progress activity as safely able. No family present during session. Per chart review, pt is on home hospice. Anticipate plan will be for pt to return home on home hospice, depending on medical course and family decision.         If plan is discharge home, recommend the following: Assistance with cooking/housework;Assist for transportation;Help with stairs or ramp for entrance;A lot of help with walking and/or transfers;A lot of help with bathing/dressing/bathroom   Can travel by private vehicle        Equipment Recommendations    Recommendations for Other Services       Functional Status Assessment Patient has had a recent decline in their functional status and/or demonstrates limited ability to make significant improvements in function in a reasonable and predictable amount of time     Precautions / Restrictions Precautions Precautions: Fall Precaution Comments: restraints; mittens; agitation Restrictions Weight Bearing Restrictions: No      Mobility  Bed Mobility               General bed mobility comments: NT-deferred for safety reasons. Pt restless and midly agitated-trying to remove mittens, He did follow commands with verbal/demonstrative cueing when assessing UEs/LEs-limited by cognition    Transfers                        Ambulation/Gait                   Stairs            Wheelchair Mobility     Tilt Bed    Modified Rankin (Stroke Patients Only)       Balance                                             Pertinent Vitals/Pain Pain Assessment Pain Assessment: Faces Faces Pain Scale: No hurt    Home Living Family/patient expects to be discharged to:: Private residence                   Additional Comments: no family present to provide history/PLOF info. pt unable    Prior Function                       Extremity/Trunk Assessment   Upper Extremity Assessment Upper Extremity Assessment: Generalized weakness;Difficult to assess due to impaired cognition    Lower Extremity Assessment Lower Extremity Assessment: Generalized weakness;Difficult to assess due to impaired cognition       Communication   Communication Communication: Difficulty following commands/understanding  Cognition Arousal: Alert (when awakened) Behavior During Therapy: Agitated, Restless (mildly agitated;restless-trying to remove mittens) Overall Cognitive Status: History of cognitive impairments - at baseline  General Comments      Exercises     Assessment/Plan    PT Assessment Patient needs continued PT services  PT Problem List Decreased strength;Decreased range of motion;Decreased balance;Decreased mobility;Decreased cognition;Decreased safety awareness;Decreased knowledge of use of DME       PT Treatment Interventions DME instruction;Balance training;Gait training;Functional mobility training;Therapeutic activities;Therapeutic exercise;Patient/family education    PT Goals (Current goals can be found in the Care Plan section)  Acute Rehab PT Goals PT Goal Formulation: Patient unable to participate in goal setting Time For Goal Achievement: 12/16/22 Potential to Achieve Goals: Fair    Frequency Min 1X/week     Co-evaluation                AM-PAC PT "6 Clicks" Mobility  Outcome Measure Help needed turning from your back to your side while in a flat bed without using bedrails?: Total Help needed moving from lying on your back to sitting on the side of a flat bed without using bedrails?: Total Help needed moving to and from a bed to a chair (including a wheelchair)?: Total Help needed standing up from a chair using your arms (e.g., wheelchair or bedside chair)?: Total Help needed to walk in hospital room?: Total Help needed climbing 3-5 steps with a railing? : Total 6 Click Score: 6    End of Session   Activity Tolerance: Treatment limited secondary to agitation Patient left: in bed;with call bell/phone within reach;with bed alarm set;with restraints reapplied   PT Visit Diagnosis: Muscle weakness (generalized) (M62.81);Difficulty in walking, not elsewhere classified (R26.2);History of falling (Z91.81)    Time: 1610-9604 PT Time Calculation (min) (ACUTE ONLY): 8 min   Charges:   PT Evaluation $PT Eval Low Complexity: 1 Low   PT General Charges $$ ACUTE PT VISIT: 1 Visit            Faye Ramsay, PT Acute Rehabilitation  Office: [phone]

## 2022-12-02 NOTE — Progress Notes (Signed)
[facility] Liaison Note- Hospitalized hospice patient  [name] is a current [facility] patient with a terminal diagnosis of abnormal weight loss. He was at his day program yesterday 8.7 and fell face forward out of his wheelchair. When he arrived to the ED he was originally complaining of head and neck pain however this subsided. During his time in the ED he became tachycardic and hypoxic with some altered mental status and the decision was made to admit. He is admitted as of 8.7 with diagnosis of Sepsis/UTI/Covid. [facility] was notified after he was transported to ED. Per Dr. Patric Dykes with [facility] this is a related hospital admission.   Patient continues to be agitated and in wrist restraints. Continues to be on isolation due to COVID.    Patient continues to be inpatient appropriate due to need of IV medication and acute respiratory failure with hypoxia possibly due to COVID-19.   Vital signs: 97.8, 54, 54, 16, 110/61, 96% on RA   I/O:  792 ml via PO, 1,000 ml via IV / 2,490 urine    Abnormal Labs:  Glucose-Capillary 70 - 99 mg/dL 130 (H)   Diagnostics: none since 8.7    IV/PRN: IV decadron 6mg  every 24 hours, IV rocephin 2g every 24 hours, IV haldol 3mg  every 6 hours PRN   Problem List: Sepsis likely due to Klebsiella bacteremia The sepsis physiology is now resolved. Continue IV Rocephin. Has remained afebrile. Blood cultures + for klebsiella, sensitivities are pending. Out of bed to chair consult physical therapy.  Continues inspirometry and flutter valve.   COVID-19 infection superimposed on COPD: Has been weaned to room air when I went to the room he is off oxygen breathing comfortably asking for food. Out of bed to chair consult physical therapy. Continue remdesivir. Continue flutter valve and incentive spirometry.   GERD: Continue antiacid and PPIs.   Depression: Continue citalopram and trazodone.   Dementia with behavioral  disturbances: Continue Risperdal at bedtime. He has been significantly agitated overnight required a higher dose of risperidone and IV Haldol several times. Had to be placed in restraints.  Increase Haldol will have talked with family about comfort measures.   Hyperlipidemia: Follow-up with PCP.   Essential hypertension: On no antihypertensive medications at home.   Delirium/acute confusional state: Continue Risperdal at night Haldol IV as needed. Try to keep up with her lites stable magnesium greater than 2, potassium greater than 4   Discharge Planning: ongoing    Family Contact: son udpated    IDT: Updated   Goals of Care: DNR, comfort care  Please call with any questions or concerns. Please use [facility] for all hospice patients transportation needs.   Alexander Stall, LCSW [facility] hospital liaison [phone]

## 2022-12-03 DIAGNOSIS — W19XXXA Unspecified fall, initial encounter: Secondary | ICD-10-CM

## 2022-12-03 DIAGNOSIS — A419 Sepsis, unspecified organism: Secondary | ICD-10-CM | POA: Diagnosis not present

## 2022-12-03 DIAGNOSIS — T83511A Infection and inflammatory reaction due to indwelling urethral catheter, initial encounter: Secondary | ICD-10-CM | POA: Diagnosis not present

## 2022-12-03 DIAGNOSIS — B961 Klebsiella pneumoniae [K. pneumoniae] as the cause of diseases classified elsewhere: Secondary | ICD-10-CM | POA: Diagnosis not present

## 2022-12-03 LAB — GLUCOSE, CAPILLARY
Glucose-Capillary: 85 mg/dL (ref 70–99)
Glucose-Capillary: 141 mg/dL — ABNORMAL HIGH (ref 70–99)
Glucose-Capillary: 161 mg/dL — ABNORMAL HIGH (ref 70–99)
Glucose-Capillary: 115 mg/dL — ABNORMAL HIGH (ref 70–99)
Glucose-Capillary: 125 mg/dL — ABNORMAL HIGH (ref 70–99)

## 2022-12-03 LAB — BASIC METABOLIC PANEL WITH GFR
Anion gap: 9 (ref 5–15)
BUN: 26 mg/dL — ABNORMAL HIGH (ref 8–23)
CO2: 29 mmol/L (ref 22–32)
Calcium: 8.9 mg/dL (ref 8.9–10.3)
Chloride: 103 mmol/L (ref 98–111)
Creatinine, Ser: 0.64 mg/dL (ref 0.61–1.24)
GFR, Estimated: >60 mL/min (ref >60)
Glucose, Bld: 121 mg/dL — ABNORMAL HIGH (ref 70–99)
Potassium: 3.6 mmol/L (ref 3.5–5.1)
Sodium: 141 mmol/L (ref 135–145)

## 2022-12-03 LAB — MAGNESIUM: Magnesium: 2 mg/dL (ref 1.7–2.4)

## 2022-12-03 MED ORDER — HALOPERIDOL LACTATE 5 MG/ML IJ SOLN: 5.0000 mg | Freq: Four times a day (QID) | INTRAMUSCULAR | Status: DC | PRN

## 2022-12-03 MED ORDER — MELATONIN 5 MG PO TABS
5.0000 mg | ORAL_TABLET | Freq: Every day | ORAL | Status: DC
  Administered 2022-12-03 – 2022-12-04 (×2): 5 mg via ORAL
  Filled 2022-12-03 (×2): qty 1

## 2022-12-03 MED ORDER — AMOXICILLIN-POT CLAVULANATE 875-125 MG PO TABS
1.0000 | ORAL_TABLET | Freq: Two times a day (BID) | ORAL | Status: DC
  Administered 2022-12-03 – 2022-12-05 (×5): 1 via ORAL
  Filled 2022-12-03 (×5): qty 1

## 2022-12-03 NOTE — Progress Notes (Signed)
[facility] PROGRESS NOTE    Progress Note  BAELIN PETROSSIAN  ZHY:[id number] DOB: Aug 28, 1932 DOA: 11/29/2022 PCP: [name], MD     Brief Narrative:   Alexander [name] is an 89 y.o. male past medical history of Alzheimer's, anxiety BPH nonobstructive CAD hypertensive heart disease constipation brought in from [facility] due to hypotension, he was placed in a chair and then fell, but no loss of consciousness in the ED he was found to have a lactic acid of 2.7 white count of 16.6 with a left shift SARS-CoV-2 PCR was positive, CT of the head showed no acute findings CT cervical spine showed no traumatic fractures or dislocation chronic degenerative disease chest x-ray showed no acute cardiopulmonary disease.  Spiked a fever in the ED with a heart rate of 124 responded to acetaminophen was started empirically on Rocephin and IV fluids  Assessment/Plan:   Sepsis likely due to Klebsiella bacteremia The sepsis physiology is now resolved. Continue IV Rocephin, transition to oral Unasyn Out of bed to chair consult physical therapy.  Continues inspirometry and flutter valve.  COVID-19 infection superimposed on COPD: Has been weaned to room air when I went to the room he is off oxygen breathing comfortably asking for food. Out of bed to chair consult physical therapy. Continue remdesivir. Continue flutter valve and incentive spirometry.  GERD: Continue antiacid and PPIs.  Depression: Continue citalopram and trazodone.  Dementia with behavioral disturbances: Continue Risperdal and melatonin at bedtime. Increase Haldol he became significantly agitated at night he is sundowning.  Had to be in restraints with mittens.  Hyperlipidemia: Follow-up with PCP.  Essential hypertension: On no antihypertensive medications at home.  Delirium/acute confusional state: Continue Risperdal at night Haldol IV as needed. Try to keep up with her lites stable magnesium greater than 2,  potassium greater than 4   DVT prophylaxis: lovenox Family Communication:none Status is: Inpatient Remains inpatient appropriate because: Acute respiratory failure with hypoxia possibly due to COVID-19    Code Status:     Code Status Orders  (From admission, onward)           Start     Ordered   11/29/22 1704  Full code  Continuous       Question:  By:  Answer:  Consent: discussion documented in EHR   11/29/22 1703           Code Status History     Date Active Date Inactive Code Status Order ID Comments User Context   11/29/2020 0601 11/30/2020 1749 Full Code [id number]  [name], MD ED   04/11/2020 1417 04/13/2020 1410 Full Code [id number]  [name], MD ED   09/03/2018 0140 09/03/2018 1209 Full Code [id number]  [name], MD Inpatient   02/28/2013 1115 03/02/2013 1913 Full Code [id number]  [name], MD Inpatient         IV Access:   Peripheral IV   Procedures and diagnostic studies:   No results found.   Medical Consultants:   None.   Subjective:    [name] no complaints  Objective:    Vitals:   12/03/22 0000 12/03/22 0012 12/03/22 0400 12/03/22 0500  BP: 134/72  123/72   Pulse: 61     Resp: 18 15 13    Temp:  97.9 F (36.6 C) (!) 97.5 F (36.4 C)   TempSrc:  Axillary Oral   SpO2: 100%  100%   Weight:    59.9 kg  Height:  SpO2: 100 % O2 Flow Rate (L/min): 2 L/min   Intake/Output Summary (Last 24 hours) at 12/03/2022 0746 Last data filed at 12/03/2022 0616 Gross per 24 hour  Intake 260 ml  Output 1040 ml  Net -780 ml   Filed Weights   12/01/22 0500 12/02/22 0500 12/03/22 0500  Weight: 59.7 kg 60.4 kg 59.9 kg    Exam: General exam: In no acute distress. Respiratory system: Good air movement and clear to auscultation. Cardiovascular system: S1 & S2 heard, RRR. No JVD. Gastrointestinal system: Abdomen is nondistended, soft and nontender.  Extremities: No pedal edema. Skin: No rashes,  lesions or ulcers Psychiatry: Judgement and insight appear normal. Mood & affect appropriate.  Data Reviewed:    Labs: Basic Metabolic Panel: Recent Labs  Lab 11/29/22 1409 11/30/22 0312  NA 140 140  K 3.7 4.0  CL 103 104  CO2 28 28  GLUCOSE 109* 112*  BUN 21 20  CREATININE 0.87 0.69  CALCIUM 9.0 9.0  MG  --  1.8   GFR Estimated Creatinine Clearance: 55.1 mL/min (by C-G formula based on SCr of 0.69 mg/dL). Liver Function Tests: Recent Labs  Lab 11/30/22 0312  AST 17  ALT 12  ALKPHOS 51  BILITOT 0.7  PROT 5.7*  ALBUMIN 3.1*   No results for input(s): "LIPASE", "AMYLASE" in the last 168 hours. No results for input(s): "AMMONIA" in the last 168 hours. Coagulation profile Recent Labs  Lab 11/29/22 1500  INR 1.2   COVID-19 Labs  No results for input(s): "DDIMER", "FERRITIN", "LDH", "CRP" in the last 72 hours.  Lab Results  Component Value Date   SARSCOV2NAA POSITIVE (A) 11/29/2022   SARSCOV2NAA NEGATIVE 11/28/2020   SARSCOV2NAA POSITIVE (A) 04/11/2020   SARSCOV2NAA NEGATIVE 07/09/2019    CBC: Recent Labs  Lab 11/29/22 1409 11/30/22 0312  WBC 14.0* 16.6*  NEUTROABS 12.0* 15.0*  HGB 11.7* 10.8*  HCT 38.0* 34.2*  MCV 97.9 97.7  PLT 247 210   Cardiac Enzymes: No results for input(s): "CKTOTAL", "CKMB", "CKMBINDEX", "TROPONINI" in the last 168 hours. BNP (last 3 results) No results for input(s): "PROBNP" in the last 8760 hours. CBG: Recent Labs  Lab 12/01/22 0750 12/02/22 0829 12/02/22 1146 12/02/22 1701 12/02/22 2155  GLUCAP 99 108* 128* 107* 91   D-Dimer: No results for input(s): "DDIMER" in the last 72 hours. Hgb A1c: No results for input(s): "HGBA1C" in the last 72 hours.  Lipid Profile: No results for input(s): "CHOL", "HDL", "LDLCALC", "TRIG", "CHOLHDL", "LDLDIRECT" in the last 72 hours. Thyroid function studies: No results for input(s): "TSH", "T4TOTAL", "T3FREE", "THYROIDAB" in the last 72 hours.  Invalid input(s):  "FREET3" Anemia work up: No results for input(s): "VITAMINB12", "FOLATE", "FERRITIN", "TIBC", "IRON", "RETICCTPCT" in the last 72 hours. Sepsis Labs: Recent Labs  Lab 11/29/22 1409 11/29/22 1511 11/29/22 1747 11/29/22 2247 11/30/22 0312  WBC 14.0*  --   --   --  16.6*  LATICACIDVEN  --  2.7* 2.1* 0.9  --    Microbiology Recent Results (from the past 240 hour(s))  Urine Culture     Status: Abnormal   Collection Time: 11/29/22 12:58 PM   Specimen: Urine, Catheterized  Result Value Ref Range Status   Specimen Description   Final    URINE, CATHETERIZED Performed at [facility], [address], [place], [place] [address]    Special Requests   Final    NONE Reflexed from [id number] Performed at [name], [address] [place], [place]  [address]    Culture >=100,000 COLONIES/mL KLEBSIELLA PNEUMONIAE (A)  Final   Report Status 12/02/2022 FINAL  Final   Organism ID, Bacteria KLEBSIELLA PNEUMONIAE (A)  Final      Susceptibility   Klebsiella pneumoniae - MIC*    AMPICILLIN >=32 RESISTANT Resistant     CEFAZOLIN <=4 SENSITIVE Sensitive     CEFEPIME <=0.12 SENSITIVE Sensitive     CEFTRIAXONE <=0.25 SENSITIVE Sensitive     CIPROFLOXACIN <=0.25 SENSITIVE Sensitive     GENTAMICIN <=1 SENSITIVE Sensitive     IMIPENEM <=0.25 SENSITIVE Sensitive     NITROFURANTOIN 64 INTERMEDIATE Intermediate     TRIMETH/SULFA <=20 SENSITIVE Sensitive     AMPICILLIN/SULBACTAM 8 SENSITIVE Sensitive     PIP/TAZO <=4 SENSITIVE Sensitive     * >=100,000 COLONIES/mL KLEBSIELLA PNEUMONIAE  SARS Coronavirus 2 by RT PCR (hospital order, performed in [facility] hospital lab) *cepheid single result test* Anterior Nasal Swab     Status: Abnormal   Collection Time: 11/29/22  2:49 PM   Specimen: Anterior Nasal Swab  Result Value Ref Range Status   SARS Coronavirus 2 by RT PCR POSITIVE (A) NEGATIVE Final    Comment: (NOTE) SARS-CoV-2 target nucleic acids are DETECTED  SARS-CoV-2 RNA  is generally detectable in upper respiratory specimens  during the acute phase of infection.  Positive results are indicative  of the presence of the identified virus, but do not rule out bacterial infection or co-infection with other pathogens not detected by the test.  Clinical correlation with patient history and  other diagnostic information is necessary to determine patient infection status.  The expected result is negative.  Fact Sheet for Patients:   RoadLapTop.co.za   Fact Sheet for Healthcare Providers:   http://kim-miller.com/    This test is not yet approved or cleared by the [place] FDA and  has been authorized for detection and/or diagnosis of SARS-CoV-2 by FDA under an Emergency Use Authorization (EUA).  This EUA will remain in effect (meaning this test can be used) for the duration of  the COVID-19 declaration under Section 564(b)(1)  of the Act, 21 U.S.C. section 360-bbb-3(b)(1), unless the authorization is terminated or revoked sooner.   Performed at [facility], [address] [place], [place] [address]   Blood Culture (routine x 2)     Status: Abnormal   Collection Time: 11/29/22  3:01 PM   Specimen: BLOOD LEFT FOREARM  Result Value Ref Range Status   Specimen Description BLOOD LEFT FOREARM  Final   Special Requests   Final    BOTTLES DRAWN AEROBIC ONLY Blood Culture adequate volume   Culture  Setup Time   Final    GRAM NEGATIVE RODS ANAEROBIC BOTTLE ONLY Organism ID to follow CRITICAL RESULT CALLED TO, READ BACK BY AND VERIFIED WITH: Ellouise Newer, AT 0981 11/30/22 Renato Shin Performed at [facility], [address], [place], [place] [address]    Culture KLEBSIELLA PNEUMONIAE (A)  Final   Report Status 12/02/2022 FINAL  Final   Organism ID, Bacteria KLEBSIELLA PNEUMONIAE  Final      Susceptibility   Klebsiella pneumoniae - MIC*    AMPICILLIN >=32 RESISTANT Resistant      CEFEPIME <=0.12 SENSITIVE Sensitive     CEFTAZIDIME <=1 SENSITIVE Sensitive     CEFTRIAXONE <=0.25 SENSITIVE Sensitive     CIPROFLOXACIN <=0.25 SENSITIVE Sensitive     GENTAMICIN <=1 SENSITIVE Sensitive     IMIPENEM <=0.25 SENSITIVE Sensitive  TRIMETH/SULFA <=20 SENSITIVE Sensitive     AMPICILLIN/SULBACTAM 4 SENSITIVE Sensitive     PIP/TAZO <=4 SENSITIVE Sensitive     * KLEBSIELLA PNEUMONIAE  Blood Culture ID Panel (Reflexed)     Status: Abnormal   Collection Time: 11/29/22  3:01 PM  Result Value Ref Range Status   Enterococcus faecalis NOT DETECTED NOT DETECTED Final   Enterococcus Faecium NOT DETECTED NOT DETECTED Final   Listeria monocytogenes NOT DETECTED NOT DETECTED Final   Staphylococcus species NOT DETECTED NOT DETECTED Final   Staphylococcus aureus (BCID) NOT DETECTED NOT DETECTED Final   Staphylococcus epidermidis NOT DETECTED NOT DETECTED Final   Staphylococcus lugdunensis NOT DETECTED NOT DETECTED Final   Streptococcus species NOT DETECTED NOT DETECTED Final   Streptococcus agalactiae NOT DETECTED NOT DETECTED Final   Streptococcus pneumoniae NOT DETECTED NOT DETECTED Final   Streptococcus pyogenes NOT DETECTED NOT DETECTED Final   A.calcoaceticus-baumannii NOT DETECTED NOT DETECTED Final   Bacteroides fragilis NOT DETECTED NOT DETECTED Final   Enterobacterales DETECTED (A) NOT DETECTED Final    Comment: Enterobacterales represent a large order of gram negative bacteria, not a single organism. CRITICAL RESULT CALLED TO, READ BACK BY AND VERIFIED WITH: Damaris Hippo PHARMD, AT 0911 11/30/22 D. VANHOOK    Enterobacter cloacae complex NOT DETECTED NOT DETECTED Final   Escherichia coli NOT DETECTED NOT DETECTED Final   Klebsiella aerogenes NOT DETECTED NOT DETECTED Final   Klebsiella oxytoca NOT DETECTED NOT DETECTED Final   Klebsiella pneumoniae DETECTED (A) NOT DETECTED Final    Comment: CRITICAL RESULT CALLED TO, READ BACK BY AND VERIFIED WITH: Damaris Hippo PHARMD, AT  0911 11/30/22 D. VANHOOK    Proteus species NOT DETECTED NOT DETECTED Final   Salmonella species NOT DETECTED NOT DETECTED Final   Serratia marcescens NOT DETECTED NOT DETECTED Final   Haemophilus influenzae NOT DETECTED NOT DETECTED Final   Neisseria meningitidis NOT DETECTED NOT DETECTED Final   Pseudomonas aeruginosa NOT DETECTED NOT DETECTED Final   Stenotrophomonas maltophilia NOT DETECTED NOT DETECTED Final   Candida albicans NOT DETECTED NOT DETECTED Final   Candida auris NOT DETECTED NOT DETECTED Final   Candida glabrata NOT DETECTED NOT DETECTED Final   Candida krusei NOT DETECTED NOT DETECTED Final   Candida parapsilosis NOT DETECTED NOT DETECTED Final   Candida tropicalis NOT DETECTED NOT DETECTED Final   Cryptococcus neoformans/gattii NOT DETECTED NOT DETECTED Final   CTX-M ESBL NOT DETECTED NOT DETECTED Final   Carbapenem resistance IMP NOT DETECTED NOT DETECTED Final   Carbapenem resistance KPC NOT DETECTED NOT DETECTED Final   Carbapenem resistance NDM NOT DETECTED NOT DETECTED Final   Carbapenem resist OXA 48 LIKE NOT DETECTED NOT DETECTED Final   Carbapenem resistance VIM NOT DETECTED NOT DETECTED Final    Comment: Performed at [facility], [address], [place], [place] [address]  Blood Culture (routine x 2)     Status: None (Preliminary result)   Collection Time: 11/29/22  3:47 PM   Specimen: BLOOD  Result Value Ref Range Status   Specimen Description   Final    BLOOD LEFT ANTECUBITAL Performed at [facility], [address] [place], [place] [address]    Special Requests   Final    BOTTLES DRAWN AEROBIC AND ANAEROBIC Blood Culture adequate volume Performed at [facility], [address], [place], [place] [address]    Culture  Setup Time   Final    GRAM NEGATIVE RODS ANAEROBIC BOTTLE ONLY CRITICAL VALUE  NOTED.  VALUE IS CONSISTENT WITH PREVIOUSLY REPORTED AND CALLED VALUE. Performed at [facility], [address] [place], [place] [address]    Culture GRAM NEGATIVE RODS  Final   Report Status PENDING  Incomplete  MRSA Next Gen by PCR, Nasal     Status: None   Collection Time: 11/29/22  5:02 PM   Specimen: Nasal Mucosa; Nasal Swab  Result Value Ref Range Status   MRSA by PCR Next Gen NOT DETECTED NOT DETECTED Final    Comment: (NOTE) The GeneXpert MRSA Assay (FDA approved for NASAL specimens only), is one component of a comprehensive MRSA colonization surveillance program. It is not intended to diagnose MRSA infection nor to guide or monitor treatment for MRSA infections. Test performance is not FDA approved in patients less than 37 years old. Performed at [facility], [address], [place], [place] [address]      Medications:    Chlorhexidine Gluconate Cloth  6 each Topical Daily   dexamethasone (DECADRON) injection  6 mg Intravenous Q24H   enoxaparin (LOVENOX) injection  40 mg Subcutaneous Daily   escitalopram  20 mg Oral Daily   feeding supplement  237 mL Oral BID BM   insulin aspart  0-20 Units Subcutaneous TID WC   melatonin  3 mg Oral QHS   metoprolol tartrate  12.5 mg Oral BID   mouth rinse  15 mL Mouth Rinse 4 times per day   risperiDONE  2 mg Oral q1800   traZODone  100 mg Oral Daily   Continuous Infusions:  cefTRIAXone (ROCEPHIN)  IV Stopped (12/02/22 0935)      LOS: 4 days   Marinda Elk  [facility]  12/03/2022, 7:46 AM

## 2022-12-03 NOTE — Plan of Care (Signed)
Patient handbook/guide at bedside, patient only alert to self, unable to understand plan of care or goals.  Problem: Education: Goal: Knowledge of General Education information will improve Description: Including pain rating scale, medication(s)/side effects and non-pharmacologic comfort measures Outcome: Progressing   Problem: Health Behavior/Discharge Planning: Goal: Ability to manage health-related needs will improve Outcome: Progressing   Problem: Clinical Measurements: Goal: Ability to maintain clinical measurements within normal limits will improve Outcome: Progressing Goal: Will remain free from infection Outcome: Progressing Goal: Diagnostic test results will improve Outcome: Progressing Goal: Respiratory complications will improve Outcome: Progressing Goal: Cardiovascular complication will be avoided Outcome: Progressing   Problem: Activity: Goal: Risk for activity intolerance will decrease Outcome: Progressing   Problem: Nutrition: Goal: Adequate nutrition will be maintained Outcome: Progressing   Problem: Coping: Goal: Level of anxiety will decrease Outcome: Progressing   Problem: Elimination: Goal: Will not experience complications related to bowel motility Outcome: Progressing Goal: Will not experience complications related to urinary retention Outcome: Progressing   Problem: Pain Managment: Goal: General experience of comfort will improve Outcome: Progressing   Problem: Safety: Goal: Ability to remain free from injury will improve Outcome: Progressing   Problem: Skin Integrity: Goal: Risk for impaired skin integrity will decrease Outcome: Progressing   Problem: Education: Goal: Knowledge of risk factors and measures for prevention of condition will improve Outcome: Progressing   Problem: Coping: Goal: Psychosocial and spiritual needs will be supported Outcome: Progressing   Problem: Respiratory: Goal: Will maintain a patent airway Outcome:  Progressing Goal: Complications related to the disease process, condition or treatment will be avoided or minimized Outcome: Progressing   Problem: Fluid Volume: Goal: Hemodynamic stability will improve Outcome: Progressing   Problem: Clinical Measurements: Goal: Diagnostic test results will improve Outcome: Progressing Goal: Signs and symptoms of infection will decrease Outcome: Progressing   Problem: Respiratory: Goal: Ability to maintain adequate ventilation will improve Outcome: Progressing   Problem: Education: Goal: Ability to describe self-care measures that may prevent or decrease complications (Diabetes Survival Skills Education) will improve Outcome: Progressing Goal: Individualized Educational Video(s) Outcome: Progressing   Problem: Coping: Goal: Ability to adjust to condition or change in health will improve Outcome: Progressing   Problem: Fluid Volume: Goal: Ability to maintain a balanced intake and output will improve Outcome: Progressing   Problem: Health Behavior/Discharge Planning: Goal: Ability to identify and utilize available resources and services will improve Outcome: Progressing Goal: Ability to manage health-related needs will improve Outcome: Progressing   Problem: Metabolic: Goal: Ability to maintain appropriate glucose levels will improve Outcome: Progressing   Problem: Nutritional: Goal: Maintenance of adequate nutrition will improve Outcome: Progressing Goal: Progress toward achieving an optimal weight will improve Outcome: Progressing   Problem: Skin Integrity: Goal: Risk for impaired skin integrity will decrease Outcome: Progressing   Problem: Tissue Perfusion: Goal: Adequacy of tissue perfusion will improve Outcome: Progressing   Problem: Safety: Goal: Non-violent Restraint(s) Outcome: Progressing   Problem: Education: Goal: Knowledge of risk factors and measures for prevention of condition will improve Outcome:  Progressing   Problem: Coping: Goal: Psychosocial and spiritual needs will be supported Outcome: Progressing   Problem: Respiratory: Goal: Will maintain a patent airway Outcome: Progressing Goal: Complications related to the disease process, condition or treatment will be avoided or minimized Outcome: Progressing

## 2022-12-03 NOTE — Progress Notes (Signed)
[name] Note- Hospitalized hospice patient   Alexander [name] is a current [facility] patient with a terminal diagnosis of abnormal weight loss. He was at his day program yesterday 8.7 and fell face forward out of his wheelchair. When he arrived to the ED he was originally complaining of head and neck pain however this subsided. During his time in the ED he became tachycardic and hypoxic with some altered mental status and the decision was made to admit. He is admitted as of 8.7 with diagnosis of Sepsis/UTI/Covid. [facility] was notified after he was transported to ED. Per Dr. Patric [name] with [facility] this is a related hospital admission.    Rounded with bedside RN who states patient's agitation improving.  He continues to be in mittens for patient's safety. Per RN, no plans to transfer out of stepdown today.  Attempted to contact patient's daughter via phone but unable to reach her.   Patient continues to be inpatient appropriate due to need of IV medication and acute respiratory failure with hypoxia due to COVID-19.    Vital signs: 97.8 (T), 61, 28, 137/73, 100% on RA   I/O:  260/1040    Abnormal Labs:  BUN 26, Glucose 121   Diagnostics: none new   IV/PRN: IV decadron 6mg  x1, IV rocephin 2g x1, IV haldol 5mg  x1   Problem List (per Hospitalist):  Assessment/Plan:    Sepsis likely due to Klebsiella bacteremia The sepsis physiology is now resolved. Continue IV Rocephin, transition to oral Unasyn Out of bed to chair consult physical therapy.  Continues inspirometry and flutter valve.   COVID-19 infection superimposed on COPD: Has been weaned to room air when I went to the room he is off oxygen breathing comfortably asking for food. Out of bed to chair consult physical therapy. Continue remdesivir. Continue flutter valve and incentive spirometry.   GERD: Continue antiacid and PPIs.   Depression: Continue citalopram and trazodone.   Dementia  with behavioral disturbances: Continue Risperdal and melatonin at bedtime. Increase Haldol he became significantly agitated at night he is sundowning.  Had to be in restraints with mittens.   Hyperlipidemia: Follow-up with PCP.   Essential hypertension: On no antihypertensive medications at home.   Delirium/acute confusional state: Continue Risperdal at night Haldol IV as needed. Try to keep up with her lites stable magnesium greater than 2, potassium greater than 4     DVT prophylaxis: lovenox Family Communication:none Status is: Inpatient Remains inpatient appropriate because: Acute respiratory failure with hypoxia possibly due to COVID-19   Discharge Planning: Ongoing, patient full code    Family Contact: Attempted family contact with daughter but unable to reach, no family at bedside    IDT: Updated   Goals of Care: Continued treatment for Covid 19 and Klebsiella UTI infections   Please call with any questions or concerns. Please use [facility] for all hospice patients transportation needs.    Doreatha Martin, RN, [name] [phone]

## 2022-12-03 NOTE — Plan of Care (Signed)
  Problem: Pain Managment: Goal: General experience of comfort will improve Outcome: Progressing   Problem: Safety: Goal: Ability to remain free from injury will improve Outcome: Progressing   Problem: Coping: Goal: Psychosocial and spiritual needs will be supported Outcome: Progressing   Problem: Coping: Goal: Psychosocial and spiritual needs will be supported Outcome: Progressing

## 2022-12-04 DIAGNOSIS — A419 Sepsis, unspecified organism: Secondary | ICD-10-CM | POA: Diagnosis not present

## 2022-12-04 DIAGNOSIS — B961 Klebsiella pneumoniae [K. pneumoniae] as the cause of diseases classified elsewhere: Secondary | ICD-10-CM | POA: Diagnosis not present

## 2022-12-04 DIAGNOSIS — T83511A Infection and inflammatory reaction due to indwelling urethral catheter, initial encounter: Secondary | ICD-10-CM | POA: Diagnosis not present

## 2022-12-04 DIAGNOSIS — W19XXXA Unspecified fall, initial encounter: Secondary | ICD-10-CM | POA: Diagnosis not present

## 2022-12-04 LAB — GLUCOSE, CAPILLARY
Glucose-Capillary: 108 mg/dL — ABNORMAL HIGH (ref 70–99)
Glucose-Capillary: 162 mg/dL — ABNORMAL HIGH (ref 70–99)
Glucose-Capillary: 112 mg/dL — ABNORMAL HIGH (ref 70–99)
Glucose-Capillary: 110 mg/dL — ABNORMAL HIGH (ref 70–99)

## 2022-12-04 MED ORDER — ADULT MULTIVITAMIN W/MINERALS CH
1.0000 | ORAL_TABLET | Freq: Every day | ORAL | Status: DC
  Administered 2022-12-05: 1 via ORAL
  Filled 2022-12-04: qty 1

## 2022-12-04 NOTE — Progress Notes (Signed)
Physical Therapy Treatment Patient Details Name: Alexander [name] MRN: [id number] DOB: 09/21/1932 Today's Date: 12/04/2022   History of Present Illness 89 yo male admitted with bacteremia, sepsis after sustaining a fall, COVID-19 infection superimposed on COPD: . Hx of COPD, alz dementia, CAD. Pt is on home hospice    PT Comments  Patient  found sitting upright in bed. Patient  participated in mobility to sitting  onto bed edge and stood x 1 with mod assist at [facility]. Multimodal cues frequently to complete task. Patient not verbal. Patient should be able to mobilize to a Wc/recliner if continues to be accepting. Continue PT.    If plan is discharge home, recommend the following: Assistance with cooking/housework;Assist for transportation;Help with stairs or ramp for entrance;A lot of help with walking and/or transfers;A lot of help with bathing/dressing/bathroom   Can travel by private vehicle        Equipment Recommendations  None recommended by PT    Recommendations for Other Services       Precautions / Restrictions Precautions Precautions: Fall Precaution Comments: ; mittens Restrictions Weight Bearing Restrictions: No     Mobility  Bed Mobility Overal bed mobility: Needs Assistance Bed Mobility: Supine to Sit, Sit to Supine     Supine to sit: Mod assist, HOB elevated Sit to supine: Max assist, +2 for safety/equipment   General bed mobility comments: multimodal cues to move to legs, patient did participate , once legs over, patient able to push self to elbow, mod support to fully upright position, patient attempted to sccot to bed edge, max support of legs and trunk to return to supine    Transfers Overall transfer level: Needs assistance Equipment used: Rolling walker (2 wheels) Transfers: Sit to/from Stand Sit to Stand: Mod assist           General transfer comment: multimodal cues to place hands on Rw, mod support to rise to stand up. Able to bear weght ,  attempts to take a step to left, cues to step to the  right  but not following directions. patient stood about 1 minute.    Ambulation/Gait                   Stairs             Wheelchair Mobility     Tilt Bed    Modified Rankin (Stroke Patients Only)       Balance Overall balance assessment: History of Falls, Needs assistance Sitting-balance support: Bilateral upper extremity supported, Feet supported Sitting balance-Leahy Scale: Fair     Standing balance support: Bilateral upper extremity supported, During functional activity, Reliant on assistive device for balance Standing balance-Leahy Scale: Poor Standing balance comment: stands with support                            Cognition Arousal: Alert Behavior During Therapy: Flat affect Overall Cognitive Status: No family/caregiver present to determine baseline cognitive functioning                                 General Comments: patient staring, no facial  expression change, catatonic like, patient  was cooperative        Exercises      General Comments        Pertinent Vitals/Pain Pain Assessment Pain Assessment: PAINAD Breathing: normal Negative Vocalization: none Facial Expression: smiling or  inexpressive Body Language: relaxed Consolability: no need to console PAINAD Score: 0    Home Living                          Prior Function            PT Goals (current goals can now be found in the care plan section) Progress towards PT goals: Progressing toward goals    Frequency    Min 1X/week      PT Plan      Co-evaluation              AM-PAC PT "6 Clicks" Mobility   Outcome Measure  Help needed turning from your back to your side while in a flat bed without using bedrails?: A Lot Help needed moving from lying on your back to sitting on the side of a flat bed without using bedrails?: A Lot Help needed moving to and from a bed to a  chair (including a wheelchair)?: Total Help needed standing up from a chair using your arms (e.g., wheelchair or bedside chair)?: A Lot Help needed to walk in hospital room?: Total Help needed climbing 3-5 steps with a railing? : Total 6 Click Score: 9    End of Session   Activity Tolerance: Patient tolerated treatment well Patient left: in bed;with call bell/phone within reach;with bed alarm set;with restraints reapplied;with nursing/sitter in room (mitts, roll belt not atteched to bed when PT entered room.) Nurse Communication: Mobility status PT Visit Diagnosis: Muscle weakness (generalized) (M62.81);Difficulty in walking, not elsewhere classified (R26.2);History of falling (Z91.81)     Time: 7425-9563 PT Time Calculation (min) (ACUTE ONLY): 16 min  Charges:    $Therapeutic Activity: 8-22 mins PT General Charges $$ ACUTE PT VISIT: 1 Visit                     Blanchard Kelch PT Acute Rehabilitation Services Office [phone] Weekend [phone]    Rada Hay 12/04/2022, 9:01 AM

## 2022-12-04 NOTE — Progress Notes (Signed)
[facility] PROGRESS NOTE    Progress Note  Alexander [name]  ACZ:[id number] DOB: December 27, 1932 DOA: 11/29/2022 PCP: [name], MD     Brief Narrative:   Alexander [name] is an 89 y.o. male past medical history of Alzheimer's, anxiety BPH nonobstructive CAD hypertensive heart disease constipation brought in [facility] due to hypotension, he was placed in a chair and then fell, but no loss of consciousness in the ED he was found to have a lactic acid of 2.7 white count of 16.6 with a left shift SARS-CoV-2 PCR was positive, CT of the head showed no acute findings CT cervical spine showed no traumatic fractures or dislocation chronic degenerative disease chest x-ray showed no acute cardiopulmonary disease.  Spiked a fever in the ED with a heart rate of 124 responded to acetaminophen was started empirically on Rocephin and IV fluids  Assessment/Plan:   Sepsis likely due to Klebsiella bacteremia The sepsis physiology is now resolved. Continue I oral Augmentin Out of bed to chair consult physical therapy.  Continues inspirometry and flutter valve. Transferred to MedSurg  COVID-19 infection superimposed on COPD: Has been weaned to room air when I went to the room he is off oxygen breathing comfortably asking for food. Out of bed to chair consult physical therapy. Has completed remdesivir treatment. Continue flutter valve and incentive spirometry.  GERD: Continue antiacid and PPIs.  Depression: Continue citalopram and trazodone.  Dementia with behavioral disturbances: Continue Risperdal and melatonin at bedtime. More calm overnight with given Risperdal early. Haldol IV as needed.  Hyperlipidemia: Follow-up with PCP.  Essential hypertension: On no antihypertensive medications at home.  Delirium/acute confusional state: Continue Risperdal at night Haldol IV as needed. Try to keep up with her lites stable magnesium greater than 2, potassium greater than  4   DVT prophylaxis: lovenox Family Communication:none Status is: Inpatient Remains inpatient appropriate because: Acute respiratory failure with hypoxia possibly due to COVID-19    Code Status:     Code Status Orders  (From admission, onward)           Start     Ordered   11/29/22 1704  Full code  Continuous       Question:  By:  Answer:  Consent: discussion documented in EHR   11/29/22 1703           Code Status History     Date Active Date Inactive Code Status Order ID Comments User Context   11/29/2020 0601 11/30/2020 1749 Full Code [id number]  [name], MD ED   04/11/2020 1417 04/13/2020 1410 Full Code [id number]  [name], MD ED   09/03/2018 0140 09/03/2018 1209 Full Code [id number]  [name], MD Inpatient   02/28/2013 1115 03/02/2013 1913 Full Code [id number]  [name], MD Inpatient         IV Access:   Peripheral IV   Procedures and diagnostic studies:   No results found.   Medical Consultants:   None.   Subjective:    ISAY TROCHEZ no complaints  Objective:    Vitals:   12/04/22 0200 12/04/22 0400 12/04/22 0500 12/04/22 0600  BP: (!) 107/53 (!) 161/74  (!) 158/93  Pulse:      Resp: 17 (!) 25  (!) 21  Temp:  98.5 F (36.9 C)    TempSrc:  Oral    SpO2:  99%    Weight:   63.1 kg   Height:       SpO2: 99 %  O2 Flow Rate (L/min): 2 L/min   Intake/Output Summary (Last 24 hours) at 12/04/2022 0720 Last data filed at 12/04/2022 0544 Gross per 24 hour  Intake 30 ml  Output 950 ml  Net -920 ml   Filed Weights   12/02/22 0500 12/03/22 0500 12/04/22 0500  Weight: 60.4 kg 59.9 kg 63.1 kg    Exam: General exam: In no acute distress. Respiratory system: Good air movement and clear to auscultation. Cardiovascular system: S1 & S2 heard, RRR. No JVD. Gastrointestinal system: Abdomen is nondistended, soft and nontender.  Extremities: No pedal edema. Skin: No rashes, lesions or ulcers Psychiatry:  Judgement and insight appear normal. Mood & affect appropriate.  Data Reviewed:    Labs: Basic Metabolic Panel: Recent Labs  Lab 11/29/22 1409 11/30/22 0312 12/03/22 0947  NA 140 140 141  K 3.7 4.0 3.6  CL 103 104 103  CO2 28 28 29   GLUCOSE 109* 112* 121*  BUN 21 20 26*  CREATININE 0.87 0.69 0.64  CALCIUM 9.0 9.0 8.9  MG  --  1.8 2.0   GFR Estimated Creatinine Clearance: 58.1 mL/min (by C-G formula based on SCr of 0.64 mg/dL). Liver Function Tests: Recent Labs  Lab 11/30/22 0312  AST 17  ALT 12  ALKPHOS 51  BILITOT 0.7  PROT 5.7*  ALBUMIN 3.1*   No results for input(s): "LIPASE", "AMYLASE" in the last 168 hours. No results for input(s): "AMMONIA" in the last 168 hours. Coagulation profile Recent Labs  Lab 11/29/22 1500  INR 1.2   COVID-19 Labs  No results for input(s): "DDIMER", "FERRITIN", "LDH", "CRP" in the last 72 hours.  Lab Results  Component Value Date   SARSCOV2NAA POSITIVE (A) 11/29/2022   SARSCOV2NAA NEGATIVE 11/28/2020   SARSCOV2NAA POSITIVE (A) 04/11/2020   SARSCOV2NAA NEGATIVE 07/09/2019    CBC: Recent Labs  Lab 11/29/22 1409 11/30/22 0312  WBC 14.0* 16.6*  NEUTROABS 12.0* 15.0*  HGB 11.7* 10.8*  HCT 38.0* 34.2*  MCV 97.9 97.7  PLT 247 210   Cardiac Enzymes: No results for input(s): "CKTOTAL", "CKMB", "CKMBINDEX", "TROPONINI" in the last 168 hours. BNP (last 3 results) No results for input(s): "PROBNP" in the last 8760 hours. CBG: Recent Labs  Lab 12/03/22 0757 12/03/22 1022 12/03/22 1123 12/03/22 1620 12/03/22 2151  GLUCAP 85 141* 161* 115* 125*   D-Dimer: No results for input(s): "DDIMER" in the last 72 hours. Hgb A1c: No results for input(s): "HGBA1C" in the last 72 hours.  Lipid Profile: No results for input(s): "CHOL", "HDL", "LDLCALC", "TRIG", "CHOLHDL", "LDLDIRECT" in the last 72 hours. Thyroid function studies: No results for input(s): "TSH", "T4TOTAL", "T3FREE", "THYROIDAB" in the last 72 hours.  Invalid  input(s): "FREET3" Anemia work up: No results for input(s): "VITAMINB12", "FOLATE", "FERRITIN", "TIBC", "IRON", "RETICCTPCT" in the last 72 hours. Sepsis Labs: Recent Labs  Lab 11/29/22 1409 11/29/22 1511 11/29/22 1747 11/29/22 2247 11/30/22 0312  WBC 14.0*  --   --   --  16.6*  LATICACIDVEN  --  2.7* 2.1* 0.9  --    Microbiology Recent Results (from the past 240 hour(s))  Urine Culture     Status: Abnormal   Collection Time: 11/29/22 12:58 PM   Specimen: Urine, Catheterized  Result Value Ref Range Status   Specimen Description   Final    URINE, CATHETERIZED Performed at [facility], [address], [place], [place] [address]    Special Requests   Final    NONE Reflexed from [phone] Performed at [facility]  [facility], [address] [place], [place] [address]    Culture >=100,000 COLONIES/mL KLEBSIELLA PNEUMONIAE (A)  Final   Report Status 12/02/2022 FINAL  Final   Organism ID, Bacteria KLEBSIELLA PNEUMONIAE (A)  Final      Susceptibility   Klebsiella pneumoniae - MIC*    AMPICILLIN >=32 RESISTANT Resistant     CEFAZOLIN <=4 SENSITIVE Sensitive     CEFEPIME <=0.12 SENSITIVE Sensitive     CEFTRIAXONE <=0.25 SENSITIVE Sensitive     CIPROFLOXACIN <=0.25 SENSITIVE Sensitive     GENTAMICIN <=1 SENSITIVE Sensitive     IMIPENEM <=0.25 SENSITIVE Sensitive     NITROFURANTOIN 64 INTERMEDIATE Intermediate     TRIMETH/SULFA <=20 SENSITIVE Sensitive     AMPICILLIN/SULBACTAM 8 SENSITIVE Sensitive     PIP/TAZO <=4 SENSITIVE Sensitive     * >=100,000 COLONIES/mL KLEBSIELLA PNEUMONIAE  SARS Coronavirus 2 by RT PCR (hospital order, performed in [facility] hospital lab) *cepheid single result test* Anterior Nasal Swab     Status: Abnormal   Collection Time: 11/29/22  2:49 PM   Specimen: Anterior Nasal Swab  Result Value Ref Range Status   SARS Coronavirus 2 by RT PCR POSITIVE (A) NEGATIVE Final    Comment: (NOTE) SARS-CoV-2 target nucleic acids are  DETECTED  SARS-CoV-2 RNA is generally detectable in upper respiratory specimens  during the acute phase of infection.  Positive results are indicative  of the presence of the identified virus, but do not rule out bacterial infection or co-infection with other pathogens not detected by the test.  Clinical correlation with patient history and  other diagnostic information is necessary to determine patient infection status.  The expected result is negative.  Fact Sheet for Patients:   RoadLapTop.co.za   Fact Sheet for Healthcare Providers:   http://kim-miller.com/    This test is not yet approved or cleared by the [place] FDA and  has been authorized for detection and/or diagnosis of SARS-CoV-2 by FDA under an Emergency Use Authorization (EUA).  This EUA will remain in effect (meaning this test can be used) for the duration of  the COVID-19 declaration under Section 564(b)(1)  of the Act, 21 U.S.C. section 360-bbb-3(b)(1), unless the authorization is terminated or revoked sooner.   Performed at [facility], [address] [place], [place] [address]   Blood Culture (routine x 2)     Status: Abnormal   Collection Time: 11/29/22  3:01 PM   Specimen: BLOOD LEFT FOREARM  Result Value Ref Range Status   Specimen Description BLOOD LEFT FOREARM  Final   Special Requests   Final    BOTTLES DRAWN AEROBIC ONLY Blood Culture adequate volume   Culture  Setup Time   Final    GRAM NEGATIVE RODS ANAEROBIC BOTTLE ONLY Organism ID to follow CRITICAL RESULT CALLED TO, READ BACK BY AND VERIFIED WITH: Ellouise Newer, AT 8841 11/30/22 Renato Shin Performed at [facility], [address] [place], [place] [address]    Culture KLEBSIELLA PNEUMONIAE (A)  Final   Report Status 12/02/2022 FINAL  Final   Organism ID, Bacteria KLEBSIELLA PNEUMONIAE  Final      Susceptibility   Klebsiella pneumoniae - MIC*    AMPICILLIN  >=32 RESISTANT Resistant     CEFEPIME <=0.12 SENSITIVE Sensitive     CEFTAZIDIME <=1 SENSITIVE Sensitive     CEFTRIAXONE <=0.25 SENSITIVE Sensitive     CIPROFLOXACIN <=0.25 SENSITIVE Sensitive     GENTAMICIN <=1 SENSITIVE Sensitive  IMIPENEM <=0.25 SENSITIVE Sensitive     TRIMETH/SULFA <=20 SENSITIVE Sensitive     AMPICILLIN/SULBACTAM 4 SENSITIVE Sensitive     PIP/TAZO <=4 SENSITIVE Sensitive     * KLEBSIELLA PNEUMONIAE  Blood Culture ID Panel (Reflexed)     Status: Abnormal   Collection Time: 11/29/22  3:01 PM  Result Value Ref Range Status   Enterococcus faecalis NOT DETECTED NOT DETECTED Final   Enterococcus Faecium NOT DETECTED NOT DETECTED Final   Listeria monocytogenes NOT DETECTED NOT DETECTED Final   Staphylococcus species NOT DETECTED NOT DETECTED Final   Staphylococcus aureus (BCID) NOT DETECTED NOT DETECTED Final   Staphylococcus epidermidis NOT DETECTED NOT DETECTED Final   Staphylococcus lugdunensis NOT DETECTED NOT DETECTED Final   Streptococcus species NOT DETECTED NOT DETECTED Final   Streptococcus agalactiae NOT DETECTED NOT DETECTED Final   Streptococcus pneumoniae NOT DETECTED NOT DETECTED Final   Streptococcus pyogenes NOT DETECTED NOT DETECTED Final   A.calcoaceticus-baumannii NOT DETECTED NOT DETECTED Final   Bacteroides fragilis NOT DETECTED NOT DETECTED Final   Enterobacterales DETECTED (A) NOT DETECTED Final    Comment: Enterobacterales represent a large order of gram negative bacteria, not a single organism. CRITICAL RESULT CALLED TO, READ BACK BY AND VERIFIED WITH: Damaris Hippo PHARMD, AT 0911 11/30/22 D. VANHOOK    Enterobacter cloacae complex NOT DETECTED NOT DETECTED Final   Escherichia coli NOT DETECTED NOT DETECTED Final   Klebsiella aerogenes NOT DETECTED NOT DETECTED Final   Klebsiella oxytoca NOT DETECTED NOT DETECTED Final   Klebsiella pneumoniae DETECTED (A) NOT DETECTED Final    Comment: CRITICAL RESULT CALLED TO, READ BACK BY AND VERIFIED  WITH: Damaris Hippo PHARMD, AT 0911 11/30/22 D. VANHOOK    Proteus species NOT DETECTED NOT DETECTED Final   Salmonella species NOT DETECTED NOT DETECTED Final   Serratia marcescens NOT DETECTED NOT DETECTED Final   Haemophilus influenzae NOT DETECTED NOT DETECTED Final   Neisseria meningitidis NOT DETECTED NOT DETECTED Final   Pseudomonas aeruginosa NOT DETECTED NOT DETECTED Final   Stenotrophomonas maltophilia NOT DETECTED NOT DETECTED Final   Candida albicans NOT DETECTED NOT DETECTED Final   Candida auris NOT DETECTED NOT DETECTED Final   Candida glabrata NOT DETECTED NOT DETECTED Final   Candida krusei NOT DETECTED NOT DETECTED Final   Candida parapsilosis NOT DETECTED NOT DETECTED Final   Candida tropicalis NOT DETECTED NOT DETECTED Final   Cryptococcus neoformans/gattii NOT DETECTED NOT DETECTED Final   CTX-M ESBL NOT DETECTED NOT DETECTED Final   Carbapenem resistance IMP NOT DETECTED NOT DETECTED Final   Carbapenem resistance KPC NOT DETECTED NOT DETECTED Final   Carbapenem resistance NDM NOT DETECTED NOT DETECTED Final   Carbapenem resist OXA 48 LIKE NOT DETECTED NOT DETECTED Final   Carbapenem resistance VIM NOT DETECTED NOT DETECTED Final    Comment: Performed at [facility], [address], [place], [place] [address]  Blood Culture (routine x 2)     Status: Abnormal (Preliminary result)   Collection Time: 11/29/22  3:47 PM   Specimen: BLOOD  Result Value Ref Range Status   Specimen Description   Final    BLOOD LEFT ANTECUBITAL Performed at [facility], [address], [place], [place] [address]    Special Requests   Final    BOTTLES DRAWN AEROBIC AND ANAEROBIC Blood Culture adequate volume Performed at [facility], [address] [place], [place] [address]    Culture  Setup Time   Final  GRAM NEGATIVE RODS ANAEROBIC BOTTLE ONLY CRITICAL VALUE NOTED.  VALUE IS CONSISTENT WITH PREVIOUSLY REPORTED AND CALLED VALUE.    Culture (A)   Final    KLEBSIELLA PNEUMONIAE SUSCEPTIBILITIES PERFORMED ON PREVIOUS CULTURE WITHIN THE LAST 5 DAYS. Performed at [facility], [address], [place], [place] [address]    Report Status PENDING  Incomplete  MRSA Next Gen by PCR, Nasal     Status: None   Collection Time: 11/29/22  5:02 PM   Specimen: Nasal Mucosa; Nasal Swab  Result Value Ref Range Status   MRSA by PCR Next Gen NOT DETECTED NOT DETECTED Final    Comment: (NOTE) The GeneXpert MRSA Assay (FDA approved for NASAL specimens only), is one component of a comprehensive MRSA colonization surveillance program. It is not intended to diagnose MRSA infection nor to guide or monitor treatment for MRSA infections. Test performance is not FDA approved in patients less than 36 years old. Performed at [facility], [address] [place], [place] [address]      Medications:    amoxicillin-clavulanate  1 tablet Oral Q12H   Chlorhexidine Gluconate Cloth  6 each Topical Daily   dexamethasone (DECADRON) injection  6 mg Intravenous Q24H   enoxaparin (LOVENOX) injection  40 mg Subcutaneous Daily   escitalopram  20 mg Oral Daily   feeding supplement  237 mL Oral BID BM   insulin aspart  0-20 Units Subcutaneous TID WC   melatonin  5 mg Oral QHS   metoprolol tartrate  12.5 mg Oral BID   mouth rinse  15 mL Mouth Rinse 4 times per day   risperiDONE  2 mg Oral q1800   traZODone  100 mg Oral Daily   Continuous Infusions:      LOS: 5 days   Marinda Elk  [facility]  12/04/2022, 7:20 AM

## 2022-12-04 NOTE — Progress Notes (Signed)
Initial Nutrition Assessment  DOCUMENTATION CODES:   Non-severe (moderate) malnutrition in context of chronic illness  INTERVENTION:  - Recommend liberalizing to Regular diet.  - Ensure Plus High Protein po BID, each supplement provides 350 kcal and 20 grams of protein. - Multivitamin with minerals daily - Monitor weight trends.   NUTRITION DIAGNOSIS:   Moderate Malnutrition related to chronic illness as evidenced by moderate fat depletion, severe muscle depletion.  GOAL:   Patient will meet greater than or equal to 90% of their needs  MONITOR:   PO intake, Supplement acceptance, Weight trends  REASON FOR ASSESSMENT:   Malnutrition Screening Tool    ASSESSMENT:   89 y.o. male PMH of Alzheimer's, anxiety, CAD, hypertensive heart disease, constipation brought [facility] due to hypotension. Admitted for sepsis likely due to Klebsiella bacteremia and COVID.   Patient laying in bed at time of visit. Took a long time to respond to questions and did not always respond appropriately. Noted to have a history of Alzheimer's.   Patient unsure of a UBW or if he has had any changes in weight. Per EMR, weight has fluctuated between 126-151# over the past year. Weight has trended up over the past month.  Unsure of patient oral intake at facility. Since admission to hospital, he is documented to be consuming an average of 49% of meals. RD observed breakfast tray in room with a few bites taken of pancakes and oatmeal. Patient reports appetite is fair. He has been drinking Ensure. Encouraged patient to eat 3 meals a day and continue to consume Ensure.     Medications reviewed and include: Decadron  Labs reviewed:  No BMP today   NUTRITION - FOCUSED PHYSICAL EXAM:  Flowsheet Row Most Recent Value  Orbital Region Mild depletion  Upper Arm Region Severe depletion  Thoracic and Lumbar Region Moderate depletion  Buccal Region Moderate depletion  Temple Region Severe depletion   Clavicle Bone Region Severe depletion  Clavicle and Acromion Bone Region Moderate depletion  Scapular Bone Region Unable to assess  Dorsal Hand Unable to assess  Patellar Region Moderate depletion  Anterior Thigh Region Moderate depletion  Posterior Calf Region Moderate depletion  Edema (RD Assessment) None  Hair Reviewed  Eyes Reviewed  Mouth Reviewed  Skin Reviewed  Nails Reviewed       Diet Order:   Diet Order             Diet heart healthy/carb modified Fluid consistency: Thin  Diet effective now                   EDUCATION NEEDS:  No education needs have been identified at this time  Skin:  Skin Assessment: Reviewed RN Assessment  Last BM:  8/11  Height:  Ht Readings from Last 1 Encounters:  11/29/22 5\' 8"  (1.727 m)   Weight:  Wt Readings from Last 1 Encounters:  12/04/22 63.1 kg   BMI:  Body mass index is 21.15 kg/m.  Estimated Nutritional Needs:  Kcal:  1700-1900 kcals Protein:  90-100 grams Fluid:  >/= 1.7L    Shelle Iron RD, LDN For contact information, refer to [facility].

## 2022-12-04 NOTE — Progress Notes (Signed)
[name] [address] [facility]  Hospitalized Hospice patient visit  Mr. Ciardi is a current [facility] patient with a terminal diagnosis of abnormal weight loss. He was at his day program yesterday 8.7 and fell face forward out of his wheelchair. When he arrived to the ED he was originally complaining of head and neck pain however this subsided. During his time in the ED he became tachycardic and hypoxic with some altered mental status and the decision was made to admit. He is admitted as of 8.7 with diagnosis of Sepsis/UTI/Covid. [facility] was notified after he was transported to ED. Per Dr. Patric Dykes with [facility] this is a related hospital admission.   Visited with patient at bedside. Patient has completed IV antibiotics and plan is to move out of step down unit today, with likely discharge tomorrow. Report exchanged with MD and bedside nurse. He remains inpatient appropriate due to need for higher level of monitoring.   Vital Signs-  98/93/23  132/72       99% on Room Air I&O- not documented, 475 Abnormal Labs- none new Diagnostics-  none new IV/PRN Meds- Decadron 6mg  IV daily  Problem List- Sepsis likely due to Klebsiella bacteremia The sepsis physiology is now resolved. Continue I oral Augmentin Out of bed to chair consult physical therapy.  Continues inspirometry and flutter valve. Transferred to MedSurg   COVID-19 infection superimposed on COPD: Has been weaned to room air when I went to the room he is off oxygen breathing comfortably asking for food. Out of bed to chair consult physical therapy. Has completed remdesivir treatment. Continue flutter valve and incentive spirometry.    Dementia with behavioral disturbances: Continue Risperdal and melatonin at bedtime. More calm overnight with given Risperdal early. Haldol IV as needed.   Delirium/acute confusional state: Continue Risperdal at night Haldol IV as needed. Try to keep up with her lites stable  magnesium greater than 2, potassium greater than 4    Discharge Planning- Ongoing, discharge plan for tomorrow Family Contact- Talked with daughter [name] by phone IDT: updated Goals of Care: Patient is full code   Should patient require ambulance transport home please use [facility] as we contract this service for our current patients.  Please don't hesitate to call for any hospice related questions or concerns.  Thea Gist BSN, [facility] [phone]

## 2022-12-05 DIAGNOSIS — A419 Sepsis, unspecified organism: Secondary | ICD-10-CM | POA: Diagnosis not present

## 2022-12-05 DIAGNOSIS — T83511A Infection and inflammatory reaction due to indwelling urethral catheter, initial encounter: Secondary | ICD-10-CM | POA: Diagnosis not present

## 2022-12-05 DIAGNOSIS — W19XXXA Unspecified fall, initial encounter: Secondary | ICD-10-CM | POA: Diagnosis not present

## 2022-12-05 DIAGNOSIS — B961 Klebsiella pneumoniae [K. pneumoniae] as the cause of diseases classified elsewhere: Secondary | ICD-10-CM | POA: Diagnosis not present

## 2022-12-05 DIAGNOSIS — E440 Moderate protein-calorie malnutrition: Secondary | ICD-10-CM | POA: Insufficient documentation

## 2022-12-05 LAB — GLUCOSE, CAPILLARY
Glucose-Capillary: 95 mg/dL (ref 70–99)
Glucose-Capillary: 114 mg/dL — ABNORMAL HIGH (ref 70–99)

## 2022-12-05 MED ORDER — AMOXICILLIN-POT CLAVULANATE 875-125 MG PO TABS: 1.0000 | ORAL_TABLET | Freq: Two times a day (BID) | ORAL | 0 refills | Status: AC

## 2022-12-05 NOTE — Discharge Summary (Addendum)
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   Cryptococcus neoformans/gattii NOT DETECTED NOT DETECTED Final   CTX-M ESBL NOT DETECTED NOT DETECTED Final   Carbapenem resistance IMP NOT DETECTED NOT DETECTED Final   Carbapenem resistance KPC NOT DETECTED NOT DETECTED Final   Carbapenem resistance NDM NOT DETECTED NOT DETECTED Final   Carbapenem resist OXA 48 LIKE NOT DETECTED NOT DETECTED Final   Carbapenem resistance VIM NOT DETECTED NOT DETECTED Final     Comment: Performed at [facility], [address] [place], [place] [address]  Blood Culture (routine x 2)     Status: Abnormal   Collection Time: 11/29/22  3:47 PM   Specimen: BLOOD  Result Value Ref Range Status   Specimen Description   Final    BLOOD LEFT ANTECUBITAL Performed at [facility], [address], [place], [place] [address]    Special Requests   Final    BOTTLES DRAWN AEROBIC AND ANAEROBIC Blood Culture adequate volume Performed at [facility], [address] [place], [place] [address]    Culture  Setup Time   Final    GRAM NEGATIVE RODS ANAEROBIC BOTTLE ONLY CRITICAL VALUE NOTED.  VALUE IS CONSISTENT WITH PREVIOUSLY REPORTED AND CALLED VALUE.    Culture (A)  Final    KLEBSIELLA PNEUMONIAE SUSCEPTIBILITIES PERFORMED ON PREVIOUS CULTURE WITHIN THE LAST 5 DAYS. Performed at [facility], [address] [place], [place] [address]    Report Status 12/04/2022 FINAL  Final  MRSA Next Gen by PCR, Nasal     Status: None   Collection Time: 11/29/22  5:02 PM   Specimen: Nasal Mucosa; Nasal Swab  Result Value Ref Range Status   MRSA by PCR Next Gen NOT DETECTED NOT DETECTED Final    Comment: (NOTE) The GeneXpert MRSA Assay (FDA approved for NASAL specimens only), is one component of a comprehensive MRSA colonization surveillance program. It is not intended to diagnose MRSA infection nor to guide or monitor treatment for MRSA infections. Test performance is not FDA approved in patients less than 66 years old. Performed at [facility], [address], [place], [place] [address]     SIGNED:   Marinda Elk, MD  [facility] 12/05/2022, 8:20 AM Pager   If 7PM-7AM, please contact night-coverage [email or web address] Password [email or web address]

## 2022-12-05 NOTE — Progress Notes (Signed)
   12/05/22 0325  Vitals  BP (!) 149/83  MAP (mmHg) 101  BP Location Left Arm  BP Method Automatic  Patient Position (if appropriate) Lying  Pulse Rate (!) 55  Pulse Rate Source Monitor  Resp 20  Level of Consciousness  Level of Consciousness Responds to Voice  MEWS COLOR  MEWS Score Color Green  Oxygen Therapy  SpO2 95 %  O2 Device Room Air   Patient scored yellow MEWS this morning for elevated respirations. Patient has been a yellow MEWS for the majority of the day yesterday for tachypnea. NP notified and aware, will continue to monitor.

## 2022-12-05 NOTE — Progress Notes (Signed)
Pt discharged home, transported via ambulance.

## 2022-12-05 NOTE — Progress Notes (Addendum)
Pt d/c today home with [facility]. [name] with [facility] states the transport will be with [facility] for the pt and she will arrange the schedule and details with [facility]. Family contacted about d/c.  Spoke with pt daughter Alexander [name] about pt returning to [facility]. Harmon Pier to get the pt's pcp to write a note for the pt to return the  [facility] after COVID quarantine  period.

## 2023-01-08 DIAGNOSIS — N401 Enlarged prostate with lower urinary tract symptoms: Secondary | ICD-10-CM | POA: Diagnosis not present

## 2023-01-08 DIAGNOSIS — R310 Gross hematuria: Secondary | ICD-10-CM | POA: Diagnosis not present

## 2023-01-08 DIAGNOSIS — N3945 Continuous leakage: Secondary | ICD-10-CM | POA: Diagnosis not present

## 2023-01-29 ENCOUNTER — Emergency Department (HOSPITAL_COMMUNITY)

## 2023-01-29 ENCOUNTER — Emergency Department (HOSPITAL_COMMUNITY)
Admission: EM | Admit: 2023-01-29 | Discharge: 2023-01-29 | Disposition: A | Discharge status: Home / Self Care | Attending: Emergency Medicine | Admitting: Emergency Medicine

## 2023-01-29 DIAGNOSIS — W19XXXA Unspecified fall, initial encounter: Secondary | ICD-10-CM | POA: Diagnosis not present

## 2023-01-29 DIAGNOSIS — R42 Dizziness and giddiness: Secondary | ICD-10-CM | POA: Diagnosis not present

## 2023-01-29 DIAGNOSIS — S199XXA Unspecified injury of neck, initial encounter: Secondary | ICD-10-CM | POA: Diagnosis not present

## 2023-01-29 DIAGNOSIS — R9082 White matter disease, unspecified: Secondary | ICD-10-CM | POA: Diagnosis not present

## 2023-01-29 DIAGNOSIS — S022XXA Fracture of nasal bones, initial encounter for closed fracture: Secondary | ICD-10-CM | POA: Diagnosis not present

## 2023-01-29 DIAGNOSIS — J449 Chronic obstructive pulmonary disease, unspecified: Secondary | ICD-10-CM | POA: Insufficient documentation

## 2023-01-29 DIAGNOSIS — F0390 Unspecified dementia without behavioral disturbance: Secondary | ICD-10-CM | POA: Diagnosis not present

## 2023-01-29 DIAGNOSIS — R0902 Hypoxemia: Secondary | ICD-10-CM | POA: Diagnosis not present

## 2023-01-29 DIAGNOSIS — S0990XA Unspecified injury of head, initial encounter: Secondary | ICD-10-CM | POA: Diagnosis not present

## 2023-01-29 DIAGNOSIS — M47812 Spondylosis without myelopathy or radiculopathy, cervical region: Secondary | ICD-10-CM | POA: Diagnosis not present

## 2023-01-29 DIAGNOSIS — M4802 Spinal stenosis, cervical region: Secondary | ICD-10-CM | POA: Diagnosis not present

## 2023-01-29 LAB — BASIC METABOLIC PANEL
Anion gap: 10 (ref 5–15)
BUN: 11 mg/dL (ref 8–23)
CO2: 25 mmol/L (ref 22–32)
Calcium: 9.1 mg/dL (ref 8.9–10.3)
Chloride: 106 mmol/L (ref 98–111)
Creatinine, Ser: 0.78 mg/dL (ref 0.61–1.24)
GFR, Estimated: >60 mL/min (ref >60)
Glucose, Bld: 87 mg/dL (ref 70–99)
Potassium: 4.4 mmol/L (ref 3.5–5.1)
Sodium: 141 mmol/L (ref 135–145)

## 2023-01-29 LAB — URINALYSIS, ROUTINE W REFLEX MICROSCOPIC
Bilirubin Urine: NEGATIVE
Glucose, UA: NEGATIVE mg/dL
Ketones, ur: NEGATIVE mg/dL
Leukocytes,Ua: NEGATIVE
Nitrite: POSITIVE — AB
Protein, ur: 30 mg/dL — AB
Specific Gravity, Urine: 1.012 (ref 1.005–1.030)
pH: 8 (ref 5.0–8.0)

## 2023-01-29 LAB — CBC
HCT: 38.3 % — ABNORMAL LOW (ref 39.0–52.0)
Hemoglobin: 11.9 g/dL — ABNORMAL LOW (ref 13.0–17.0)
MCH: 29.9 pg (ref 26.0–34.0)
MCHC: 31.1 g/dL (ref 30.0–36.0)
MCV: 96.2 fL (ref 80.0–100.0)
Platelets: 241 10*3/uL (ref 150–400)
RBC: 3.98 MIL/uL — ABNORMAL LOW (ref 4.22–5.81)
RDW: 12.9 % (ref 11.5–15.5)
WBC: 4 10*3/uL (ref 4.0–10.5)
nRBC: 0 % (ref 0.0–0.2)

## 2023-01-29 LAB — CBG MONITORING, ED: Glucose-Capillary: 98 mg/dL (ref 70–99)

## 2023-01-29 NOTE — ED Provider Notes (Signed)
EMERGENCY DEPARTMENT AT [facility] Provider Note   CSN: [id number] Arrival date & time: 01/29/23  5784     History  Chief Complaint  Patient presents with   Dizziness   Fall    AFSHIN [name] is a 89 y.o. male.  HPI    89 year old male comes in with chief complaint of dizziness and fall. Patient has history of melena, anemia, COPD and dementia.  He resides at memory unit.  According to EMS, patient was found on the floor, unwitnessed fall.  Patient was complaining of dizziness to the staff.  He has history of dementia.  He is not on any blood thinners.  Home Medications Prior to Admission medications   Medication Sig Start Date End Date Taking? Authorizing Provider  albuterol (PROVENTIL) (2.5 MG/3ML) 0.083% nebulizer solution TAKE 3 MLS BY NEBULIZATION EVERY 6 HOURS AS NEEDED FOR WHEEZING OR SHORTNESS OF BREATH. 02/04/21   Nyoka Cowden, MD  [facility] ACETAMINOPHEN 325 MG tablet Take 650 mg by mouth every 4 (four) hours as needed for moderate pain. 07/04/22   [provider]  escitalopram (LEXAPRO) 20 MG tablet Take 20 mg by mouth daily.    [provider]  hyoscyamine (LEVSIN) 0.125 MG tablet Take 0.125 mg by mouth 3 (three) times daily as needed for bladder spasms or cramping. 11/25/22   [provider]  LORazepam (ATIVAN) 0.5 MG tablet Take 0.5 mg by mouth every 4 (four) hours as needed for anxiety. 10/17/22   [provider]  risperiDONE (RISPERDAL) 0.5 MG tablet Take 1 mg by mouth at bedtime.    [provider]  traZODone (DESYREL) 100 MG tablet Take 100 mg by mouth daily. 10/04/22   [provider]      Allergies    Food    Review of Systems   Review of Systems  Physical Exam Updated Vital Signs BP (!) 170/95 (BP Location: Left Arm)   Pulse 71   Temp 97.9 F (36.6 C) (Oral)   Resp 18   SpO2 98%  Physical Exam Vitals and nursing note reviewed.  Constitutional:      Appearance: He is  well-developed.  HENT:     Head: Atraumatic.  Eyes:     Extraocular Movements: Extraocular movements intact.  Cardiovascular:     Rate and Rhythm: Normal rate.  Pulmonary:     Effort: Pulmonary effort is normal.  Abdominal:     Tenderness: There is no abdominal tenderness.  Musculoskeletal:     Cervical back: Neck supple.     Comments: Head to toe evaluation shows no hematoma, bleeding of the scalp, no facial abrasions, no spine step offs, crepitus of the chest or neck, no tenderness to palpation of the bilateral upper and lower extremities, no gross deformities, no chest tenderness, no pelvic pain.   Skin:    General: Skin is warm.  Neurological:     Mental Status: He is alert. Mental status is at baseline.     ED Results / Procedures / Treatments   Labs (all labs ordered are listed, but only abnormal results are displayed) Labs Reviewed  CBC - Abnormal; Notable for the following components:      Result Value   RBC 3.98 (*)    Hemoglobin 11.9 (*)    HCT 38.3 (*)    All other components within normal limits  BASIC METABOLIC PANEL  URINALYSIS, ROUTINE W REFLEX MICROSCOPIC  CBG MONITORING, ED    EKG EKG Interpretation Date/Time:  Monday January 29 2023 09:35:16 EDT Ventricular Rate:  61 PR Interval:    QRS Duration:  84 QT Interval:  380 QTC Calculation: 382 R Axis:   91  Text Interpretation: Atrial flutter possible - new Rightward axis T wave abnormality, consider lateral ischemia Abnormal ECG When compared with ECG of 29-Nov-2022 10:57, PREVIOUS ECG IS PRESENT Confirmed by Alexander [name] ([id number]) on 01/29/2023 11:25:17 AM    EKG Interpretation Date/Time:  Monday January 29 2023 12:00:09 EDT Ventricular Rate:  74 PR Interval:  124 QRS Duration:  92 QT Interval:  344 QTC Calculation: 381 R Axis:   88  Text Interpretation: Normal sinus rhythm T wave abnormality, consider anterior ischemia Abnormal ECG When compared with ECG of 29-Jan-2023 09:35, PREVIOUS ECG IS  PRESENT Confirmed by Alexander [name] [phone]) on 01/29/2023 1:43:24 PM        Radiology No results found.  Procedures Procedures    Medications Ordered in ED Medications - No data to display  ED Course/ Medical Decision Making/ A&P                                 Medical Decision Making Amount and/or Complexity of Data Reviewed Labs: ordered. Radiology: ordered.   89 year old patient comes in after sustaining what appears to be a mechanical fall.  Allegedly, he was reporting some dizziness prior to this episode.  Patient resides at memory unit. Pertinent past medical includes anemia, dementia, COPD. Collateral history provided by EMS  Based on my history and exam, differential diagnosis includes: - Traumatic brain injury including intracranial hemorrhage - Long bone fractures - Contusions - Soft tissue injury - Concussion  Other medical consideration includes orthostatic dizziness, AKI, severe anemia, arrhythmia.  Based on the initial assessment, the following workup was initiated basic labs, EKG, CT scan of the brain and C-spine.  I have independently interpreted the following imaging from the perspective of acute trauma: ct brain And the results indicate no bleed     Final Clinical Impression(s) / ED Diagnoses Final diagnoses:  Fall, initial encounter    Rx / DC Orders ED Discharge Orders     None         Alexander Kaplan, MD 01/29/23 1344

## 2023-01-29 NOTE — ED Triage Notes (Signed)
EMS stated, pt at a memory care center and the nurse stepped out and when she returned he was on the ground. Pt was complaining of dizziness off and on . Pt has dementia. Is not on blood thinners. Pt's baseline is hit and miss. Asking question he will answer WELL.

## 2023-01-29 NOTE — ED Notes (Signed)
Daughter is at bedside and wants to speak with provider. Message sent to MD.

## 2023-01-29 NOTE — ED Notes (Signed)
Report received from [facility] C. RN. Assumed care of pt at this time.

## 2023-01-29 NOTE — Discharge Instructions (Signed)
We saw you in the ER after you had a fall. °All the imaging results are normal, no fractures seen. No evidence of brain bleed. °Please be very careful with walking, and do everything possible to prevent falls. ° ° °

## 2023-01-29 NOTE — ED Notes (Addendum)
Pt able to stand with assist x2 and using walker. Pt unable to ambulate which according to the pt's daughter is abnormal for him. Daughter reports pt ambulates without any assistive devices, but does have a fall hx. Pt confused and hard of hearing.

## 2023-01-29 NOTE — Progress Notes (Signed)
[facility] liaison note     This patient is a current hospice patient with [facility].    Liaison will continue to follow for any discharge planning needs and to coordinate continuation of hospice care.    Please don't hesitate to call with any Hospice related questions or concerns.    Thank you for the opportunity to participate in this patient's care.  Glenna Fellows, BSN, RN, OCN [employer] [phone]

## 2023-01-29 NOTE — ED Notes (Signed)
Pt was stuck multiple times. Not successful.

## 2023-02-08 ENCOUNTER — Encounter (HOSPITAL_COMMUNITY): Payer: Self-pay

## 2023-02-08 ENCOUNTER — Other Ambulatory Visit: Payer: Self-pay

## 2023-02-08 ENCOUNTER — Emergency Department (HOSPITAL_COMMUNITY): Payer: Medicare Other

## 2023-02-08 ENCOUNTER — Inpatient Hospital Stay (HOSPITAL_COMMUNITY)
Admission: EM | Admit: 2023-02-08 | Discharge: 2023-02-23 | DRG: 698 | Disposition: A | Discharge status: Skilled Nursing Facility | Payer: Medicare Other | Attending: Internal Medicine | Admitting: Internal Medicine

## 2023-02-08 DIAGNOSIS — F05 Delirium due to known physiological condition: Secondary | ICD-10-CM | POA: Diagnosis present

## 2023-02-08 DIAGNOSIS — K5900 Constipation, unspecified: Secondary | ICD-10-CM | POA: Diagnosis present

## 2023-02-08 DIAGNOSIS — G9341 Metabolic encephalopathy: Secondary | ICD-10-CM | POA: Diagnosis present

## 2023-02-08 DIAGNOSIS — I700 Atherosclerosis of aorta: Secondary | ICD-10-CM | POA: Diagnosis present

## 2023-02-08 DIAGNOSIS — F03B18 Unspecified dementia, moderate, with other behavioral disturbance: Secondary | ICD-10-CM | POA: Diagnosis present

## 2023-02-08 DIAGNOSIS — K219 Gastro-esophageal reflux disease without esophagitis: Secondary | ICD-10-CM | POA: Diagnosis present

## 2023-02-08 DIAGNOSIS — Z7401 Bed confinement status: Secondary | ICD-10-CM | POA: Diagnosis not present

## 2023-02-08 DIAGNOSIS — Z8719 Personal history of other diseases of the digestive system: Secondary | ICD-10-CM

## 2023-02-08 DIAGNOSIS — T83511A Infection and inflammatory reaction due to indwelling urethral catheter, initial encounter: Secondary | ICD-10-CM | POA: Diagnosis present

## 2023-02-08 DIAGNOSIS — I10 Essential (primary) hypertension: Secondary | ICD-10-CM | POA: Diagnosis present

## 2023-02-08 DIAGNOSIS — R1310 Dysphagia, unspecified: Secondary | ICD-10-CM | POA: Diagnosis not present

## 2023-02-08 DIAGNOSIS — J4489 Other specified chronic obstructive pulmonary disease: Secondary | ICD-10-CM | POA: Diagnosis present

## 2023-02-08 DIAGNOSIS — R82998 Other abnormal findings in urine: Secondary | ICD-10-CM | POA: Diagnosis present

## 2023-02-08 DIAGNOSIS — E869 Volume depletion, unspecified: Secondary | ICD-10-CM | POA: Diagnosis present

## 2023-02-08 DIAGNOSIS — F32A Depression, unspecified: Secondary | ICD-10-CM | POA: Diagnosis present

## 2023-02-08 DIAGNOSIS — G309 Alzheimer's disease, unspecified: Secondary | ICD-10-CM | POA: Diagnosis present

## 2023-02-08 DIAGNOSIS — N3000 Acute cystitis without hematuria: Secondary | ICD-10-CM | POA: Diagnosis not present

## 2023-02-08 DIAGNOSIS — R509 Fever, unspecified: Secondary | ICD-10-CM | POA: Diagnosis not present

## 2023-02-08 DIAGNOSIS — Z8042 Family history of malignant neoplasm of prostate: Secondary | ICD-10-CM

## 2023-02-08 DIAGNOSIS — J410 Simple chronic bronchitis: Secondary | ICD-10-CM | POA: Diagnosis not present

## 2023-02-08 DIAGNOSIS — R000 Tachycardia, unspecified: Secondary | ICD-10-CM | POA: Diagnosis not present

## 2023-02-08 DIAGNOSIS — R4182 Altered mental status, unspecified: Secondary | ICD-10-CM | POA: Diagnosis not present

## 2023-02-08 DIAGNOSIS — H9190 Unspecified hearing loss, unspecified ear: Secondary | ICD-10-CM | POA: Diagnosis present

## 2023-02-08 DIAGNOSIS — Z79899 Other long term (current) drug therapy: Secondary | ICD-10-CM

## 2023-02-08 DIAGNOSIS — K2100 Gastro-esophageal reflux disease with esophagitis, without bleeding: Secondary | ICD-10-CM | POA: Diagnosis not present

## 2023-02-08 DIAGNOSIS — H409 Unspecified glaucoma: Secondary | ICD-10-CM | POA: Diagnosis present

## 2023-02-08 DIAGNOSIS — N401 Enlarged prostate with lower urinary tract symptoms: Secondary | ICD-10-CM | POA: Diagnosis present

## 2023-02-08 DIAGNOSIS — N390 Urinary tract infection, site not specified: Secondary | ICD-10-CM | POA: Diagnosis not present

## 2023-02-08 DIAGNOSIS — J449 Chronic obstructive pulmonary disease, unspecified: Secondary | ICD-10-CM | POA: Diagnosis present

## 2023-02-08 DIAGNOSIS — D638 Anemia in other chronic diseases classified elsewhere: Secondary | ICD-10-CM | POA: Diagnosis present

## 2023-02-08 DIAGNOSIS — Z515 Encounter for palliative care: Secondary | ICD-10-CM | POA: Diagnosis not present

## 2023-02-08 DIAGNOSIS — E785 Hyperlipidemia, unspecified: Secondary | ICD-10-CM | POA: Diagnosis present

## 2023-02-08 DIAGNOSIS — Y738 Miscellaneous gastroenterology and urology devices associated with adverse incidents, not elsewhere classified: Secondary | ICD-10-CM | POA: Diagnosis present

## 2023-02-08 DIAGNOSIS — D649 Anemia, unspecified: Secondary | ICD-10-CM | POA: Diagnosis present

## 2023-02-08 DIAGNOSIS — Z8249 Family history of ischemic heart disease and other diseases of the circulatory system: Secondary | ICD-10-CM

## 2023-02-08 DIAGNOSIS — F02B18 Dementia in other diseases classified elsewhere, moderate, with other behavioral disturbance: Secondary | ICD-10-CM | POA: Diagnosis present

## 2023-02-08 DIAGNOSIS — Z9049 Acquired absence of other specified parts of digestive tract: Secondary | ICD-10-CM

## 2023-02-08 DIAGNOSIS — F02B3 Dementia in other diseases classified elsewhere, moderate, with mood disturbance: Secondary | ICD-10-CM | POA: Diagnosis present

## 2023-02-08 DIAGNOSIS — L89151 Pressure ulcer of sacral region, stage 1: Secondary | ICD-10-CM | POA: Diagnosis not present

## 2023-02-08 DIAGNOSIS — E782 Mixed hyperlipidemia: Secondary | ICD-10-CM | POA: Diagnosis not present

## 2023-02-08 DIAGNOSIS — Z83438 Family history of other disorder of lipoprotein metabolism and other lipidemia: Secondary | ICD-10-CM

## 2023-02-08 DIAGNOSIS — R338 Other retention of urine: Secondary | ICD-10-CM | POA: Diagnosis present

## 2023-02-08 DIAGNOSIS — Z800 Family history of malignant neoplasm of digestive organs: Secondary | ICD-10-CM

## 2023-02-08 DIAGNOSIS — Z7189 Other specified counseling: Secondary | ICD-10-CM | POA: Diagnosis not present

## 2023-02-08 DIAGNOSIS — Z823 Family history of stroke: Secondary | ICD-10-CM

## 2023-02-08 DIAGNOSIS — Y846 Urinary catheterization as the cause of abnormal reaction of the patient, or of later complication, without mention of misadventure at the time of the procedure: Secondary | ICD-10-CM | POA: Diagnosis present

## 2023-02-08 DIAGNOSIS — F0390 Unspecified dementia without behavioral disturbance: Secondary | ICD-10-CM | POA: Diagnosis present

## 2023-02-08 DIAGNOSIS — I119 Hypertensive heart disease without heart failure: Secondary | ICD-10-CM | POA: Diagnosis present

## 2023-02-08 DIAGNOSIS — R531 Weakness: Secondary | ICD-10-CM | POA: Diagnosis not present

## 2023-02-08 DIAGNOSIS — F02B4 Dementia in other diseases classified elsewhere, moderate, with anxiety: Secondary | ICD-10-CM | POA: Diagnosis present

## 2023-02-08 DIAGNOSIS — R5081 Fever presenting with conditions classified elsewhere: Secondary | ICD-10-CM | POA: Diagnosis not present

## 2023-02-08 DIAGNOSIS — L8990 Pressure ulcer of unspecified site, unspecified stage: Secondary | ICD-10-CM | POA: Insufficient documentation

## 2023-02-08 DIAGNOSIS — Z87891 Personal history of nicotine dependence: Secondary | ICD-10-CM

## 2023-02-08 DIAGNOSIS — Z9889 Other specified postprocedural states: Secondary | ICD-10-CM

## 2023-02-08 DIAGNOSIS — I2510 Atherosclerotic heart disease of native coronary artery without angina pectoris: Secondary | ICD-10-CM | POA: Diagnosis present

## 2023-02-08 LAB — CBC WITH DIFFERENTIAL/PLATELET
Abs Immature Granulocytes: 0.05 10*3/uL (ref 0.00–0.07)
Basophils Absolute: 0 10*3/uL (ref 0.0–0.1)
Basophils Relative: 0 %
Eosinophils Absolute: 0 10*3/uL (ref 0.0–0.5)
Eosinophils Relative: 0 %
HCT: 37.8 % — ABNORMAL LOW (ref 39.0–52.0)
Hemoglobin: 11.7 g/dL — ABNORMAL LOW (ref 13.0–17.0)
Immature Granulocytes: 1 %
Lymphocytes Relative: 8 %
Lymphs Abs: 0.8 10*3/uL (ref 0.7–4.0)
MCH: 30.3 pg (ref 26.0–34.0)
MCHC: 31 g/dL (ref 30.0–36.0)
MCV: 97.9 fL (ref 80.0–100.0)
Monocytes Absolute: 1.1 10*3/uL — ABNORMAL HIGH (ref 0.1–1.0)
Monocytes Relative: 12 %
Neutro Abs: 7.5 10*3/uL (ref 1.7–7.7)
Neutrophils Relative %: 79 %
Platelets: 265 10*3/uL (ref 150–400)
RBC: 3.86 MIL/uL — ABNORMAL LOW (ref 4.22–5.81)
RDW: 12.8 % (ref 11.5–15.5)
WBC: 9.5 10*3/uL (ref 4.0–10.5)
nRBC: 0 % (ref 0.0–0.2)

## 2023-02-08 LAB — PROTIME-INR
INR: 1.1 (ref 0.8–1.2)
Prothrombin Time: 14.4 s (ref 11.4–15.2)

## 2023-02-08 LAB — COMPREHENSIVE METABOLIC PANEL
ALT: 11 U/L (ref 0–44)
AST: 14 U/L — ABNORMAL LOW (ref 15–41)
Albumin: 4 g/dL (ref 3.5–5.0)
Alkaline Phosphatase: 62 U/L (ref 38–126)
Anion gap: 11 (ref 5–15)
BUN: 13 mg/dL (ref 8–23)
CO2: 32 mmol/L (ref 22–32)
Calcium: 9.2 mg/dL (ref 8.9–10.3)
Chloride: 97 mmol/L — ABNORMAL LOW (ref 98–111)
Creatinine, Ser: 0.76 mg/dL (ref 0.61–1.24)
GFR, Estimated: >60 mL/min (ref >60)
Glucose, Bld: 93 mg/dL (ref 70–99)
Potassium: 4.2 mmol/L (ref 3.5–5.1)
Sodium: 140 mmol/L (ref 135–145)
Total Bilirubin: 1 mg/dL (ref 0.3–1.2)
Total Protein: 6.8 g/dL (ref 6.5–8.1)

## 2023-02-08 LAB — URINALYSIS, W/ REFLEX TO CULTURE (INFECTION SUSPECTED)
Bacteria, UA: NONE SEEN
Bilirubin Urine: NEGATIVE
Glucose, UA: NEGATIVE mg/dL
Ketones, ur: NEGATIVE mg/dL
Nitrite: NEGATIVE
Protein, ur: 100 mg/dL — AB
Specific Gravity, Urine: 1.016 (ref 1.005–1.030)
pH: 8 (ref 5.0–8.0)

## 2023-02-08 LAB — I-STAT CG4 LACTIC ACID, ED: Lactic Acid, Venous: 1.2 mmol/L (ref 0.5–1.9)

## 2023-02-08 MED ORDER — LACTATED RINGERS IV BOLUS (SEPSIS)
1000.0000 mL | Freq: Once | INTRAVENOUS | Status: AC
  Administered 2023-02-08: 1000 mL via INTRAVENOUS

## 2023-02-08 MED ORDER — ONDANSETRON HCL 4 MG/2ML IJ SOLN: 4.0000 mg | Freq: Four times a day (QID) | INTRAMUSCULAR | Status: DC | PRN

## 2023-02-08 MED ORDER — ACETAMINOPHEN 325 MG PO TABS
650.0000 mg | ORAL_TABLET | Freq: Once | ORAL | Status: AC
  Administered 2023-02-08: 650 mg via ORAL
  Filled 2023-02-08: qty 2

## 2023-02-08 MED ORDER — ENOXAPARIN SODIUM 40 MG/0.4ML IJ SOSY
40.0000 mg | PREFILLED_SYRINGE | INTRAMUSCULAR | Status: DC
  Administered 2023-02-08 – 2023-02-22 (×15): 40 mg via SUBCUTANEOUS
  Filled 2023-02-08 (×15): qty 0.4

## 2023-02-08 MED ORDER — TRAZODONE HCL 100 MG PO TABS
100.0000 mg | ORAL_TABLET | Freq: Every day | ORAL | Status: DC
  Administered 2023-02-08 – 2023-02-17 (×10): 100 mg via ORAL
  Filled 2023-02-08 (×12): qty 1

## 2023-02-08 MED ORDER — ESCITALOPRAM OXALATE 20 MG PO TABS
20.0000 mg | ORAL_TABLET | Freq: Every day | ORAL | Status: DC
  Administered 2023-02-09 – 2023-02-22 (×12): 20 mg via ORAL
  Filled 2023-02-08 (×14): qty 1

## 2023-02-08 MED ORDER — RISPERIDONE 1 MG PO TABS
1.0000 mg | ORAL_TABLET | Freq: Every day | ORAL | Status: DC
  Administered 2023-02-08 – 2023-02-17 (×10): 1 mg via ORAL
  Filled 2023-02-08 (×10): qty 1

## 2023-02-08 MED ORDER — ACETAMINOPHEN 325 MG PO TABS
650.0000 mg | ORAL_TABLET | Freq: Four times a day (QID) | ORAL | Status: DC | PRN
  Administered 2023-02-22: 650 mg via ORAL
  Filled 2023-02-08: qty 2

## 2023-02-08 MED ORDER — HALOPERIDOL LACTATE 5 MG/ML IJ SOLN
3.0000 mg | Freq: Once | INTRAMUSCULAR | Status: AC
  Administered 2023-02-08: 3 mg via INTRAVENOUS
  Filled 2023-02-08: qty 1

## 2023-02-08 MED ORDER — ONDANSETRON HCL 4 MG PO TABS: 4.0000 mg | ORAL_TABLET | Freq: Four times a day (QID) | ORAL | Status: DC | PRN

## 2023-02-08 MED ORDER — SODIUM CHLORIDE 0.9 % IV SOLN
1.0000 g | INTRAVENOUS | Status: DC
  Administered 2023-02-09 – 2023-02-15 (×7): 1 g via INTRAVENOUS
  Filled 2023-02-08 (×7): qty 10

## 2023-02-08 MED ORDER — HALOPERIDOL LACTATE 5 MG/ML IJ SOLN
2.0000 mg | Freq: Four times a day (QID) | INTRAMUSCULAR | Status: DC | PRN
  Administered 2023-02-09 – 2023-02-20 (×6): 2 mg via INTRAVENOUS
  Filled 2023-02-08 (×7): qty 1

## 2023-02-08 MED ORDER — ACETAMINOPHEN 650 MG RE SUPP: 650.0000 mg | Freq: Four times a day (QID) | RECTAL | Status: DC | PRN

## 2023-02-08 MED ORDER — ALBUTEROL SULFATE (2.5 MG/3ML) 0.083% IN NEBU: 2.5000 mg | INHALATION_SOLUTION | Freq: Four times a day (QID) | RESPIRATORY_TRACT | Status: DC | PRN

## 2023-02-08 MED ORDER — SODIUM CHLORIDE 0.9 % IV SOLN
1.0000 g | Freq: Once | INTRAVENOUS | Status: AC
  Administered 2023-02-08: 1 g via INTRAVENOUS
  Filled 2023-02-08: qty 10

## 2023-02-08 NOTE — ED Triage Notes (Signed)
Patient BIB daughter. Daughter reports fevers, tremors, and foul smelling urine x 2 days. Patient also had a fall this AM. Patient has hx of dementia and is on hospice.
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
## 2023-02-08 NOTE — ED Notes (Signed)
Pt placed on 2lpm via [place] d/t o2 dropping to 88%

## 2023-02-08 NOTE — ED Notes (Signed)
Pts daughter leaving to get something to eat.  Pt has been trying to pull at IV and at Urine cath.  Placed mittens on pts hands and secured.  Turned on TV and dimmed lights in room in hopes pt could sleep and not pull at anything while she has left.

## 2023-02-08 NOTE — ED Notes (Signed)
Pt continues to try to remove mittens.  Pts daughter states that pt will remove IV and foley cath if he can get to them.  Pt given 3 mg Haldol as ordered by Dr Robb Matar to try to keep pt from pulling at mittens we are using to try to keep pt from removing same.

## 2023-02-09 DIAGNOSIS — R5081 Fever presenting with conditions classified elsewhere: Secondary | ICD-10-CM | POA: Diagnosis not present

## 2023-02-09 LAB — CBC
HCT: 33.4 % — ABNORMAL LOW (ref 39.0–52.0)
Hemoglobin: 10.2 g/dL — ABNORMAL LOW (ref 13.0–17.0)
MCH: 30.2 pg (ref 26.0–34.0)
MCHC: 30.5 g/dL (ref 30.0–36.0)
MCV: 98.8 fL (ref 80.0–100.0)
Platelets: 220 10*3/uL (ref 150–400)
RBC: 3.38 MIL/uL — ABNORMAL LOW (ref 4.22–5.81)
RDW: 12.6 % (ref 11.5–15.5)
WBC: 7 10*3/uL (ref 4.0–10.5)
nRBC: 0 % (ref 0.0–0.2)

## 2023-02-09 LAB — COMPREHENSIVE METABOLIC PANEL
ALT: 12 U/L (ref 0–44)
AST: 14 U/L — ABNORMAL LOW (ref 15–41)
Albumin: 3.3 g/dL — ABNORMAL LOW (ref 3.5–5.0)
Alkaline Phosphatase: 53 U/L (ref 38–126)
Anion gap: 9 (ref 5–15)
BUN: 12 mg/dL (ref 8–23)
CO2: 27 mmol/L (ref 22–32)
Calcium: 8.7 mg/dL — ABNORMAL LOW (ref 8.9–10.3)
Chloride: 102 mmol/L (ref 98–111)
Creatinine, Ser: 0.71 mg/dL (ref 0.61–1.24)
GFR, Estimated: >60 mL/min (ref >60)
Glucose, Bld: 78 mg/dL (ref 70–99)
Potassium: 3.8 mmol/L (ref 3.5–5.1)
Sodium: 138 mmol/L (ref 135–145)
Total Bilirubin: 0.8 mg/dL (ref 0.3–1.2)
Total Protein: 5.5 g/dL — ABNORMAL LOW (ref 6.5–8.1)

## 2023-02-09 LAB — URINE CULTURE

## 2023-02-09 MED ORDER — CHLORHEXIDINE GLUCONATE CLOTH 2 % EX PADS
6.0000 | MEDICATED_PAD | Freq: Every day | CUTANEOUS | Status: DC
  Administered 2023-02-09 – 2023-02-23 (×14): 6 via TOPICAL

## 2023-02-09 MED ORDER — POTASSIUM CHLORIDE IN NACL 20-0.9 MEQ/L-% IV SOLN
INTRAVENOUS | Status: AC
  Filled 2023-02-09 (×2): qty 1000

## 2023-02-09 NOTE — Plan of Care (Signed)
[facility] Tonsillectomy/Adenoidectomy, Postoperative PEDS care plan entered in error.

## 2023-02-09 NOTE — Plan of Care (Signed)
  Problem: Fluid Volume: Goal: Hemodynamic stability will improve Outcome: Progressing   Problem: Respiratory: Goal: Ability to maintain adequate ventilation will improve Outcome: Progressing   Problem: Clinical Measurements: Goal: Ability to maintain clinical measurements within normal limits will improve Outcome: Progressing Goal: Diagnostic test results will improve Outcome: Progressing   Problem: Nutrition: Goal: Adequate nutrition will be maintained Outcome: Progressing   Problem: Coping: Goal: Level of anxiety will decrease Outcome: Progressing   Problem: Pain Managment: Goal: General experience of comfort will improve Outcome: Progressing   Problem: Safety: Goal: Ability to remain free from injury will improve Outcome: Progressing   Problem: Skin Integrity: Goal: Risk for impaired skin integrity will decrease Outcome: Progressing

## 2023-02-09 NOTE — Progress Notes (Signed)
[name] [address] Hospice liaison note     This patient is a current hospice patient with [facility].    Liaison will continue to follow for any discharge planning needs and to coordinate continuation of hospice care.    Please don't hesitate to call with any Hospice related questions or concerns.    Thank you for the opportunity to participate in this patient's care.  Glenna Fellows, BSN, RN, OCN [employer] [phone]

## 2023-02-09 NOTE — Plan of Care (Signed)
  Problem: Fluid Volume: Goal: Hemodynamic stability will improve Outcome: Progressing   Problem: Clinical Measurements: Goal: Signs and symptoms of infection will decrease Outcome: Progressing   Problem: Respiratory: Goal: Ability to maintain adequate ventilation will improve Outcome: Progressing   Problem: Health Behavior/Discharge Planning: Goal: Ability to manage health-related needs will improve Outcome: Progressing
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
## 2023-02-09 NOTE — Progress Notes (Signed)
[name] [phone] [facility] Hospitalized Hospice Patient Visit  [name] is a current hospice patient, with hospice diagnosis of abnormal weight loss and unspecified dementia, who was admitted to [facility] on 02/08/23 with fever related to likely UTI. [facility] was notified of admission bedside nurse on 02/09/23. Per Dr. Patric Dykes, hospice physician, this is a related hospital admission.   Visited with patient in hospital. He is resting quietly, appears to be sleeping without distress. Contacted daughter by phone and discussed current status.   Patient remains GIP appropriate due to need for IV antibiotics.  Vital Signs: 98/93/18    122/66    100% on [place] (LPM not documented)  I&O:  300/1000  Abnormal labs: Ca 8.7, Alb 3.3, Hgb 10.2, urine and blood cultures pending  Diagnostics: chest xray without significant finding   IV/PRN Meds: Rocephin 1G IV x 2  Problem List:  Principal Problem:   Fever Likely due to:    Acute UTI (urinary tract infection)  Admit to MedSurg/inpatient. Continue ceftriaxone 1 g IVPB daily. Follow-up blood culture and sensitivity. Follow urine culture and sensitivity. Follow-up CBC and chemistry in the morning.      Anemia Monitor hematocrit and hemoglobin.     Moderate dementia with behavioral disturbance Continue risperidone 1 mg p.o. at bedtime.   Discharge Planning: Ongoing  Family Contact: Communicated with daughter by phone.  IDT: Updated  Goals of Care: Full Code  Should patient need ambulance transfer at discharge- please use [facility]) as they contract this service for our active hospice patients.  Glenna Fellows BSN, RN, [facility] hospital liaison [phone]

## 2023-02-09 NOTE — TOC Initial Note (Addendum)
Transition of Care [facility]) - Initial/Assessment Note    Patient Details  Name: Alexander [name] MRN: [id number] Date of Birth: December 26, 1932  Transition of Care [facility]) CM/SW Contact:    Harriett Sine, RN Phone Number:[phone]  02/09/2023, 1:16 PM  Clinical Narrative:                 Pt from home, pt has pcp and POA [facility], [phone]. Spoke to pt at bedside about contacting his daughter [name] and d/c plans. Called Sonja left message. No SDOH needs at this time, TOC following  Called Sonja spoke about support at home, POA documents for record, dc plans.   Expected Discharge Plan: Home w Home Health Services Barriers to Discharge: No Barriers Identified   Patient Goals and CMS Choice Patient states their goals for this hospitalization and ongoing recovery are:: none stated CMS Medicare.gov Compare Post Acute Care list provided to:: Patient Represenative (must comment) (Sharpe(POA),Sonja (Daughter)  [phone] (Mobile)) Choice offered to / list presented to : [facility] POA / Guardian      Expected Discharge Plan and Services       Living arrangements for the past 2 months: Single Family Home                 DME Arranged:  (waiting for pt eval)                    Prior Living Arrangements/Services Living arrangements for the past 2 months: Single Family Home Lives with:: Spouse Patient language and need for interpreter reviewed:: Yes Do you feel safe going back to the place where you live?: Yes      Need for Family Participation in Patient Care: Yes (Comment) Care giver support system in place?: Yes (comment)   Criminal Activity/Legal Involvement Pertinent to Current Situation/Hospitalization: No - Comment as needed  Activities of Daily Living   ADL Screening (condition at time of admission) Independently performs ADLs?: No Does the patient have a NEW difficulty with bathing/dressing/toileting/self-feeding that is expected to last >3 days?: No Does the  patient have a NEW difficulty with getting in/out of bed, walking, or climbing stairs that is expected to last >3 days?: No Does the patient have a NEW difficulty with communication that is expected to last >3 days?: No Is the patient deaf or have difficulty hearing?: Yes Does the patient have difficulty seeing, even when wearing glasses/contacts?: No Does the patient have difficulty concentrating, remembering, or making decisions?: No  Permission Sought/Granted Permission sought to share information with : Family Supports Permission granted to share information with : Yes, Verbal Permission Granted  Share Information with NAME: Sharpe(POA),Sonja (Daughter)  [phone] (Mobile)           Emotional Assessment Appearance:: Appears stated age Attitude/Demeanor/Rapport: Engaged   Orientation: : Oriented to Self Alcohol / Substance Use: Other (comment) (quit smoking 12 years ago) Psych Involvement: No (comment)  Admission diagnosis:  Fever [R50.9] Acute cystitis without hematuria [N30.00] Patient Active Problem List   Diagnosis Date Noted   Fever 02/08/2023   Acute UTI (urinary tract infection) 02/08/2023   Malnutrition of moderate degree 12/05/2022   Bacteremia due to Klebsiella pneumoniae 12/01/2022   Sepsis secondary to UTI (HCC) 11/29/2022   COPD exacerbation (HCC) 11/29/2020   Acute hypoxemic respiratory failure (HCC) 11/29/2020   Moderate dementia with behavioral disturbance 11/29/2020   COVID-19 04/11/2020   Syncope and collapse 04/11/2020   Right shoulder pain 02/19/2019   Hematochezia 01/03/2019   Bilateral  hearing loss 10/06/2018   Dementia without behavioral disturbance (HCC) 09/02/2018   Acute respiratory failure with hypoxia (HCC) 09/02/2018   Rash 05/30/2018   Blood in ear canal, left 05/21/2018   Dermatitis 04/12/2018   Preventative health care 11/27/2017   Weight loss 08/27/2017   Abdominal discomfort, generalized 08/27/2017   Anemia 08/27/2017    Hyponatremia 08/27/2017   Chronic obstructive pulmonary disease (HCC) 06/05/2017   Chronic pansinusitis 06/05/2017   BPH with obstruction/lower urinary tract symptoms 01/30/2017   Gynecomastia, male 09/06/2016   Chest pain 05/21/2016   Hyperglycemia 05/09/2016   Depression 05/09/2016   Rhinitis, chronic 05/09/2016   Upper airway cough syndrome 03/18/2016   Hypertensive heart disease    Dark stools 11/14/2015   Screening for prostate cancer 05/06/2015   Thrush 05/06/2015   DOE (dyspnea on exertion) 07/31/2014   Constipation 01/27/2014   Glaucoma    Erectile dysfunction    Left ventricular hypertrophy 12/01/2012   GERD (gastroesophageal reflux disease) 10/28/2012   COPD, moderate (HCC) 10/22/2012   HLD (hyperlipidemia) 10/11/2009   Essential hypertension 10/11/2009   Cough, persistent 10/11/2009   PCP:  [name], MD Pharmacy:   [id number] - [place], [place] - [address] [place] [address] Phone: [phone] Fax: [phone]     Social Determinants of Health (SDOH) Social History: SDOH Screenings   Food Insecurity: No Food Insecurity (02/09/2023)  Housing: Patient Declined (02/09/2023)  Transportation Needs: No Transportation Needs (02/09/2023)  Utilities: Not At Risk (02/09/2023)  Depression (PHQ2-9): Low Risk  (12/16/2020)  Financial Resource Strain: Low Risk  (09/07/2017)  Physical Activity: Sufficiently Active (09/07/2017)  Social Connections: Socially Integrated (09/07/2017)  Stress: No Stress Concern Present (09/07/2017)  Tobacco Use: Medium Risk (02/08/2023)   SDOH Interventions:     Readmission Risk Interventions    11/30/2022    9:58 AM  Readmission Risk Prevention Plan  Transportation Screening Complete  PCP or Specialist Appt within 5-7 Days Complete  Home Care Screening Complete  Medication Review (RN CM) Complete

## 2023-02-10 ENCOUNTER — Inpatient Hospital Stay (HOSPITAL_COMMUNITY): Payer: Medicare Other

## 2023-02-10 DIAGNOSIS — R509 Fever, unspecified: Secondary | ICD-10-CM | POA: Diagnosis not present

## 2023-02-10 LAB — CBC WITH DIFFERENTIAL/PLATELET
Abs Immature Granulocytes: 0.02 10*3/uL (ref 0.00–0.07)
Basophils Absolute: 0 10*3/uL (ref 0.0–0.1)
Basophils Relative: 1 %
Eosinophils Absolute: 0.4 10*3/uL (ref 0.0–0.5)
Eosinophils Relative: 5 %
HCT: 35.6 % — ABNORMAL LOW (ref 39.0–52.0)
Hemoglobin: 10.8 g/dL — ABNORMAL LOW (ref 13.0–17.0)
Immature Granulocytes: 0 %
Lymphocytes Relative: 24 %
Lymphs Abs: 1.8 10*3/uL (ref 0.7–4.0)
MCH: 30.3 pg (ref 26.0–34.0)
MCHC: 30.3 g/dL (ref 30.0–36.0)
MCV: 100 fL (ref 80.0–100.0)
Monocytes Absolute: 0.9 10*3/uL (ref 0.1–1.0)
Monocytes Relative: 12 %
Neutro Abs: 4.2 10*3/uL (ref 1.7–7.7)
Neutrophils Relative %: 58 %
Platelets: 238 10*3/uL (ref 150–400)
RBC: 3.56 MIL/uL — ABNORMAL LOW (ref 4.22–5.81)
RDW: 12.3 % (ref 11.5–15.5)
WBC: 7.2 10*3/uL (ref 4.0–10.5)
nRBC: 0 % (ref 0.0–0.2)

## 2023-02-10 LAB — RENAL FUNCTION PANEL
Albumin: 3.2 g/dL — ABNORMAL LOW (ref 3.5–5.0)
Anion gap: 7 (ref 5–15)
BUN: 14 mg/dL (ref 8–23)
CO2: 30 mmol/L (ref 22–32)
Calcium: 9 mg/dL (ref 8.9–10.3)
Chloride: 103 mmol/L (ref 98–111)
Creatinine, Ser: 0.65 mg/dL (ref 0.61–1.24)
GFR, Estimated: >60 mL/min (ref >60)
Glucose, Bld: 83 mg/dL (ref 70–99)
Phosphorus: 3.3 mg/dL (ref 2.5–4.6)
Potassium: 4 mmol/L (ref 3.5–5.1)
Sodium: 140 mmol/L (ref 135–145)

## 2023-02-10 LAB — MAGNESIUM: Magnesium: 2 mg/dL (ref 1.7–2.4)

## 2023-02-10 NOTE — Progress Notes (Addendum)
[name] [phone] [facility] Hospitalized Hospice Patient Visit  [name] is a current hospice patient, with hospice diagnosis of abnormal weight loss and unspecified dementia, who was admitted to [facility] on 02/08/23 with fever related to likely UTI. [facility] was notified of admission bedside nurse on 02/09/23. Per Dr. Patric Dykes, hospice physician, this is a related hospital admission.   Visited with patient in hospital. He is sitting up in bed with mitts on. Denies complaint at this time. Contacted daughter by phone and updated with visit and current care.   Patient remains GIP appropriate due to need for IV medications for restlessness, IV fluids, and IV antibiotics.  Vital Signs: 97.9/83/16    133/74    100% 2LPM [place]  I&O:  1254/1000  Abnormal labs: Alb 3.2, HBG 10.8, BC negative, Urine Culture "many species, recommend recollection"  Diagnostics: chest port, 1 view. IMPRESSION: *Probable left basilar atelectatic changes. Bilateral lung fields are otherwise clear.  IV/PRN Meds: Rocephin 1G IV x 1, Haldol 2mg  IV x 2, NS with KCL at 39ml/hr  Problem List:  Fever: -Tmax of 101.8. -No fever documented today. -Patient is on IV Rocephin. -Suspicion for possible UTI, however, urinalysis is not very consistent. -Follow urine culture. -Gentle hydration and repeat chest x-ray patient may be at risk for possible aspiration. -Further management depend on hospital course.   Possible UTI (urinary tract infection): -Patient had fever on presentation. -UA is not quite consistent with UTI. -Urine cultures pending. -Patient is currently on IV Rocephin. -No further fever documented. -Further management will depend on hospital course.   Volume depletion: -Urine specific gravity was 1.016. -Gentle hydration.  Patient has history of diastolic dysfunction. -Repeat chest ray after hydrating patient to rule out aspiration pneumonia.     Glaucoma: Currently not on therapy. Follow-up with ophthalmology.   Anemia: -Normal MCV. -Likely multifactorial. -Hemoglobin of 10.2 g/dL today. -Continue to monitor.        Depression Continue escitalopram 20 mg p.o. daily. Continue trazodone 100 mg p.o. bedtime.     Moderate dementia with behavioral disturbance Continue risperidone 1 mg p.o. at bedtime.    Discharge Planning: Ongoing  Family Contact: Communicated with daughter by phone.  IDT: Updated  Goals of Care: Full Code currently. Started discussion with daughter about GOC and desire to revisit code status discussion. She is open to discussing but not available to do so today. Will follow up tomorrow.   Should patient need ambulance transfer at discharge- please use [facility]) as they contract this service for our active hospice patients.  Glenna Fellows BSN, RN, [facility] hospital liaison [phone]
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
## 2023-02-10 NOTE — Progress Notes (Signed)
     Referral previously received for Alexander [name] for goals of care discussion. Chart reviewed and updates received from RN. See [facility] note dated 02/10/23. This is a GIP patient and they are actively seeing him in the hospital, engaged in discussions with the family, whom they are familiar with.  We will allow [facility] the space to continue their ongoing GOC conversations. We will sign off for now and discontinue consult order.   Thank you for your referral and allowing [facility] to assist in Alexander [name]'s care.   Wynne Dust, NP Palliative Medicine Team Phone: [phone]  NO CHARGE

## 2023-02-10 NOTE — Plan of Care (Signed)
  Problem: Clinical Measurements: Goal: Diagnostic test results will improve Outcome: Progressing   Problem: Elimination: Goal: Will not experience complications related to urinary retention Outcome: Progressing   Problem: Safety: Goal: Ability to remain free from injury will improve Outcome: Progressing   Problem: Skin Integrity: Goal: Risk for impaired skin integrity will decrease Outcome: Progressing

## 2023-02-10 NOTE — Plan of Care (Signed)
Plan of Care reviewed. 

## 2023-02-11 DIAGNOSIS — N390 Urinary tract infection, site not specified: Secondary | ICD-10-CM | POA: Diagnosis not present

## 2023-02-11 NOTE — Progress Notes (Signed)
Patient assessed and bilateral mittens removed
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
## 2023-02-11 NOTE — Progress Notes (Signed)
Patient continues to tolerate no mittens well, patient has not attempted to pull at any lines or tubes at this time, will continue to monitor.

## 2023-02-11 NOTE — Progress Notes (Signed)
Alexander [name] [phone] [facility] Patient Visit  [name] is a current hospice patient, with hospice diagnosis of abnormal weight loss and unspecified dementia, who was admitted to [facility] on 02/08/23 with fever related to likely UTI. [facility] was notified of admission bedside nurse on 02/09/23. Per Dr. Patric [name], hospice physician, this is a related hospital admission.   Visited with patient in hospital. He is sitting up in bed without distress. Verbalizes no complaint. Contacted daughter by phone and updated with visit and current care, as well as ongoing GOC discussion.  Patient remains GIP appropriate due to need for IV medications for restlessness, and IV antibiotics.  Vital Signs: 97.8/66/18     135/70      100% 2LPM [place]  I&O:  360/not documented  Abnormal labs: none new  Diagnostics: none new  IV/PRN Meds: Rocephin 1G IV x 1, Haldol 2mg  IV x 1  Problem List:  Possible UTI -Currently on Rocephin.  Blood cultures negative so far.  Urine cultures grew multiple species. -Will complete a course of antibiotics for 7 days -No temperature spikes of the last 24 hours.   Volume depletion -From poor oral intake.  Treated with IV fluids.  Off IV fluids.   Acute metabolic encephalopathy/delirium Moderate dementia with behavioral disturbance Goals of care -Still requiring intermittent Haldol.  Overall prognosis is guarded to poor.  Enrolled with hospice as an outpatient but remains full code.  Hospice following. -Fall precautions.  Delirium precautions.  PT eval.  Continue risperidone   Depression -Continue escitalopram along with trazodone at bedtime   Anemia of chronic disease -From chronic illnesses.  Hemoglobin currently stable.  Monitor intermittently   Chronic urinary retention Chronic Foley catheter -Continue Foley catheter.  Outpatient follow-up with urology.   Glaucoma - currently not on therapy.  Outpatient  follow-up with ophthalmology   Hyperlipidemia Aortic atherosclerosis -Currently not on medical therapy.  Outpatient follow-up with PCP   Essential hypertension -Not on any antihypertensives as an outpatient.  Outpatient follow-up.   COPD -Currently stable.  Monitor   Physical deconditioning -PT eval  Discharge Planning: Ongoing  Family Contact: Spoke with daughter by phone.  IDT: Updated  Goals of Care: Full Code, discussed at length with daughter, who reports that her father was adamant that he did want to be resuscitated. Discussed that in the future there could be circumstances that would cause her to change her mind about him remaining a full code. She was receptive to this discussion but for now desires for her father to continue to be a full code.   Should patient need ambulance transfer at discharge- please use [facility]) as they contract this service for our active hospice patients.  Alexander [name] BSN, RN, [facility] hospital liaison [phone]

## 2023-02-11 NOTE — Plan of Care (Signed)

## 2023-02-12 DIAGNOSIS — R509 Fever, unspecified: Secondary | ICD-10-CM | POA: Diagnosis not present

## 2023-02-12 DIAGNOSIS — N390 Urinary tract infection, site not specified: Secondary | ICD-10-CM | POA: Diagnosis not present

## 2023-02-12 LAB — BASIC METABOLIC PANEL
Anion gap: 8 (ref 5–15)
BUN: 10 mg/dL (ref 8–23)
CO2: 33 mmol/L — ABNORMAL HIGH (ref 22–32)
Calcium: 8.7 mg/dL — ABNORMAL LOW (ref 8.9–10.3)
Chloride: 97 mmol/L — ABNORMAL LOW (ref 98–111)
Creatinine, Ser: 0.67 mg/dL (ref 0.61–1.24)
GFR, Estimated: >60 mL/min (ref >60)
Glucose, Bld: 93 mg/dL (ref 70–99)
Potassium: 3.8 mmol/L (ref 3.5–5.1)
Sodium: 138 mmol/L (ref 135–145)

## 2023-02-12 LAB — CBC WITH DIFFERENTIAL/PLATELET
Abs Immature Granulocytes: 0.01 10*3/uL (ref 0.00–0.07)
Basophils Absolute: 0 10*3/uL (ref 0.0–0.1)
Basophils Relative: 1 %
Eosinophils Absolute: 0.3 10*3/uL (ref 0.0–0.5)
Eosinophils Relative: 5 %
HCT: 36.8 % — ABNORMAL LOW (ref 39.0–52.0)
Hemoglobin: 11.3 g/dL — ABNORMAL LOW (ref 13.0–17.0)
Immature Granulocytes: 0 %
Lymphocytes Relative: 25 %
Lymphs Abs: 1.2 10*3/uL (ref 0.7–4.0)
MCH: 30.3 pg (ref 26.0–34.0)
MCHC: 30.7 g/dL (ref 30.0–36.0)
MCV: 98.7 fL (ref 80.0–100.0)
Monocytes Absolute: 0.6 10*3/uL (ref 0.1–1.0)
Monocytes Relative: 12 %
Neutro Abs: 2.7 10*3/uL (ref 1.7–7.7)
Neutrophils Relative %: 57 %
Platelets: 263 10*3/uL (ref 150–400)
RBC: 3.73 MIL/uL — ABNORMAL LOW (ref 4.22–5.81)
RDW: 12.3 % (ref 11.5–15.5)
WBC: 4.8 10*3/uL (ref 4.0–10.5)
nRBC: 0 % (ref 0.0–0.2)

## 2023-02-12 LAB — MAGNESIUM: Magnesium: 1.9 mg/dL (ref 1.7–2.4)

## 2023-02-12 MED ORDER — SENNOSIDES-DOCUSATE SODIUM 8.6-50 MG PO TABS
1.0000 | ORAL_TABLET | Freq: Once | ORAL | Status: DC
  Filled 2023-02-12: qty 1

## 2023-02-12 NOTE — Progress Notes (Signed)
[name] [phone] [facility] Hospitalized Hospice Patient Visit   [name] is a current hospice patient, with hospice diagnosis of abnormal weight loss and unspecified dementia, who was admitted to [facility] on 02/08/23 with fever related to likely UTI. [facility] was notified of admission bedside nurse on 02/09/23. Per Dr. Patric Dykes, hospice physician, this is a related hospital admission.   Visited at bedside,  Alexander [name] is being fed by CNA. There are no apparent symptoms of distress. He does not answer questions appropriately and mumbles answers when questioned. He remains inpatient appropriate with need for IVAB. Vital Signs: 98/63/16     108/96     100% on room air I&O: 333/550 Abnormal labs: Chloride 97, Ca+ 8.7, RBC 3.73, Hgb 11.3, Hct 36.8 Diagnostics: None New IV/PRN Meds: Rocephin 1 gram q24H Problem List: Possible UTI -Currently on Rocephin.  Blood cultures negative so far.  Urine cultures grew multiple species. -Will complete a course of antibiotics for 7 days -No temperature spikes of the last 48 hours.   Volume depletion -From poor oral intake.  Treated with IV fluids.  Off IV fluids.   Acute metabolic encephalopathy/delirium Moderate dementia with behavioral disturbance Goals of care -Has not required Haldol since 02/10/2023.  Overall prognosis is guarded to poor.  Enrolled with hospice as an outpatient but remains full code.  Hospice following. -Fall precautions.  Delirium precautions.  PT eval.  Continue risperidone   Depression -Continue escitalopram along with trazodone at bedtime   Anemia of chronic disease -From chronic illnesses.  Hemoglobin currently stable.  Monitor intermittently   Chronic urinary retention Chronic Foley catheter -Continue Foley catheter.  Outpatient follow-up with urology.  Discharge Planning: Ongoing, d/c once IVAB complete Family Contact: Message left for daughter IDT: Updated Goals of Care:  Full code Thea Gist, BSN RN Hospice hospital liaison [phone]

## 2023-02-12 NOTE — Progress Notes (Signed)
Patient has senna ordered for constipation, refusing medication at this time. Notified Dr. Kirke Corin, will try to administer again later this morning.

## 2023-02-12 NOTE — Evaluation (Signed)
Physical Therapy Evaluation Patient Details Name: Alexander [name] MRN: [id number] DOB: [birth year] Today's Date: 02/12/2023  History of Present Illness  Pt is an 89 y/o M admitted on 02/08/23 after presenting with c/o fever, AMS, malodorous urine. Work up revealed possible UTI & volume depletion. PMH: Alzheimer's disease, anxiety, asthma, BPH, nonobstructive CAD, Hypertensive heart disease, COPD, depression, GERD, HLD, chronic urinary retention with chronic foley catheter  Clinical Impression  Pt seen for PT evaluation with pt received in bed, moving towards L side of bed but unable to state why. Pt responds to name but unable to state name, birthday, location, situation. Pt also with poor ability to follow simple commands even with extra time & multimodal cuing. Pt requires assistance to initiate movement, is able to transfer STS x 3 from EOB with HHA then RW but continues to demonstrate posterior lean & unable to initiate steps. Pt assisted back to bed at end of session. No family/friends present to provide PLOF information. Will attempt to see pt on a trial basis for therapy.      If plan is discharge home, recommend the following: A lot of help with walking and/or transfers;A lot of help with bathing/dressing/bathroom;Assist for transportation;Assistance with cooking/housework;Direct supervision/assist for financial management;Help with stairs or ramp for entrance;Direct supervision/assist for medications management;Supervision due to cognitive status   Can travel by private vehicle   No    Equipment Recommendations None recommended by PT (TBD)  Recommendations for Other Services       Functional Status Assessment Patient has had a recent decline in their functional status and demonstrates the ability to make significant improvements in function in a reasonable and predictable amount of time.     Precautions / Restrictions Precautions Precautions: Fall Restrictions Weight Bearing  Restrictions: No      Mobility  Bed Mobility Overal bed mobility: Needs Assistance Bed Mobility: Supine to Sit, Sit to Supine     Supine to sit: Min assist, HOB elevated, Used rails (assistance to move BLE off EOB to initiate movement) Sit to supine: Mod assist, HOB elevated        Transfers Overall transfer level: Needs assistance Equipment used: 1 person hand held assist, Rolling walker (2 wheels) Transfers: Sit to/from Stand Sit to Stand: Mod assist           General transfer comment: STS from EOB with mod assist, pt with posterior lean onto bed with BLE, posterior lean even in standing, poor awareness of safe hand placement    Ambulation/Gait                  Stairs            Wheelchair Mobility     Tilt Bed    Modified Rankin (Stroke Patients Only)       Balance Overall balance assessment: Needs assistance Sitting-balance support: Feet supported, Bilateral upper extremity supported Sitting balance-Leahy Scale: Fair Sitting balance - Comments: close supervision static sitting Postural control: Posterior lean Standing balance support: Bilateral upper extremity supported, During functional activity, Reliant on assistive device for balance Standing balance-Leahy Scale: Poor                               Pertinent Vitals/Pain Pain Assessment Pain Assessment: Faces Faces Pain Scale: No hurt    Home Living Family/patient expects to be discharged to:: Unsure  Additional Comments: no family present to provide history/PLOF info & pt unable    Prior Function Prior Level of Function : Patient poor historian/Family not available                     Extremity/Trunk Assessment   Upper Extremity Assessment Upper Extremity Assessment: Generalized weakness;Difficult to assess due to impaired cognition    Lower Extremity Assessment Lower Extremity Assessment: Generalized weakness;Difficult to assess  due to impaired cognition       Communication   Communication Communication: Difficulty following commands/understanding Following commands: Follows one step commands inconsistently;Follows one step commands with increased time Cueing Techniques: Verbal cues;Gestural cues;Tactile cues;Visual cues  Cognition Arousal: Alert Behavior During Therapy: Restless Overall Cognitive Status: History of cognitive impairments - at baseline                                 General Comments: Pt with hx of dementia, unsure if pt has any new deficits 2/2 UTI/dehydration & no family present to confirm.        General Comments General comments (skin integrity, edema, etc.): Pt on 2L/min via nasal cannula, SpO2 >90%.    Exercises     Assessment/Plan    PT Assessment Patient needs continued PT services  PT Problem List Cardiopulmonary status limiting activity;Decreased strength;Decreased range of motion;Decreased activity tolerance;Decreased balance;Decreased mobility;Decreased knowledge of precautions;Decreased knowledge of use of DME;Decreased safety awareness;Decreased cognition       PT Treatment Interventions DME instruction;Balance training;Modalities;Gait training;Neuromuscular re-education;Cognitive remediation;Stair training;Functional mobility training;Therapeutic activities;Therapeutic exercise;Manual techniques;Patient/family education    PT Goals (Current goals can be found in the Care Plan section)  Acute Rehab PT Goals PT Goal Formulation: Patient unable to participate in goal setting Time For Goal Achievement: 02/26/23 Potential to Achieve Goals: Good    Frequency Min 1X/week     Co-evaluation               AM-PAC PT "6 Clicks" Mobility  Outcome Measure Help needed turning from your back to your side while in a flat bed without using bedrails?: A Little Help needed moving from lying on your back to sitting on the side of a flat bed without using  bedrails?: A Lot Help needed moving to and from a bed to a chair (including a wheelchair)?: A Lot Help needed standing up from a chair using your arms (e.g., wheelchair or bedside chair)?: A Lot Help needed to walk in hospital room?: A Lot Help needed climbing 3-5 steps with a railing? : Total 6 Click Score: 12    End of Session Equipment Utilized During Treatment: Oxygen Activity Tolerance: Patient tolerated treatment well (limited 2/2 confusion) Patient left: in bed;with call bell/phone within reach;with bed alarm set (BUE mittens donned, fall mats on floor)   PT Visit Diagnosis: Muscle weakness (generalized) (M62.81);Difficulty in walking, not elsewhere classified (R26.2);Other abnormalities of gait and mobility (R26.89)    Time: 2952-8413 PT Time Calculation (min) (ACUTE ONLY): 14 min   Charges:   PT Evaluation $PT Eval Low Complexity: 1 Low   PT General Charges $$ ACUTE PT VISIT: 1 Visit         Aleda Grana, PT, DPT 02/12/23, 4:01 PM   Sandi Mariscal 02/12/2023, 3:59 PM

## 2023-02-12 NOTE — Progress Notes (Signed)
PROGRESS NOTE    Alexander [name]  NWG:[id number] DOB: 02/05/1933 DOA: 02/08/2023 PCP: [name], MD   Brief Narrative:   89 year old male with medical history significant for Alzheimer's disease anxiety, asthma, BPH, nonobstructive CAD, hypertensive heart disease, COPD, depression, GERD, hyperlipidemia, chronic urinary retention needing chronic Foley catheter and hypertension was brought by his daughter due to fever, altered mental status and malodorous urine.  Workup revealed possible urinary tract infection and volume depletion.  He was started on IV fluids and antibiotics.  Assessment & Plan:   Possible UTI -Currently on Rocephin.  Blood cultures negative so far.  Urine cultures grew multiple species. -Will complete a course of antibiotics for 7 days -No temperature spikes of the last 48 hours.  Volume depletion -From poor oral intake.  Treated with IV fluids.  Off IV fluids.  Acute metabolic encephalopathy/delirium Moderate dementia with behavioral disturbance Goals of care -Has not required Haldol since 02/10/2023.  Overall prognosis is guarded to poor.  Enrolled with hospice as an outpatient but remains full code.  Hospice following. -Fall precautions.  Delirium precautions.  PT eval.  Continue risperidone  Depression -Continue escitalopram along with trazodone at bedtime  Anemia of chronic disease -From chronic illnesses.  Hemoglobin currently stable.  Monitor intermittently  Chronic urinary retention Chronic Foley catheter -Continue Foley catheter.  Outpatient follow-up with urology.  Glaucoma - currently not on therapy.  Outpatient follow-up with ophthalmology  Hyperlipidemia Aortic atherosclerosis -Currently not on medical therapy.  Outpatient follow-up with PCP  Essential hypertension -Not on any antihypertensives as an outpatient.  Outpatient follow-up.  COPD -Currently stable.  Monitor  Physical deconditioning -PT eval  DVT prophylaxis:  Subcutaneous Lovenox Code Status: Full Family Communication: None at bedside Disposition Plan: Status is: Inpatient Remains inpatient appropriate because: Of severity of illness    Consultants: Hospice Procedures: None  Antimicrobials: Rocephin from 02/08/2023 onwards   Subjective: Patient seen and examined at bedside.  Remains confused.  Very poor historian.  No fever, vomiting, agitation reported.   Objective: Vitals:   02/11/23 0335 02/11/23 1231 02/11/23 1947 02/12/23 0429  BP: 135/70 (!) 146/81 (!) 130/91 139/83  Pulse: 66 91 78 70  Resp: 18 18 18 18   Temp: 97.8 F (36.6 C) 98 F (36.7 C) 98.2 F (36.8 C) 97.7 F (36.5 C)  TempSrc: Oral Oral Oral Axillary  SpO2: 100% 90% 100% 100%  Weight:      Height:        Intake/Output Summary (Last 24 hours) at 02/12/2023 0812 Last data filed at 02/12/2023 0650 Gross per 24 hour  Intake 200 ml  Output 1600 ml  Net -1400 ml   Filed Weights   02/08/23 1110  Weight: 63.1 kg    Examination:  General: No acute distress.  Remains on room air.  Looks chronically ill and deconditioned ENT/neck: No palpable neck masses or elevated JVD noted respiratory: Bilateral decreased breath sounds at bases with scattered crackles CVS: Rate mostly controlled; S1 and S2 are heard Abdominal: Soft, nontender, distended mildly; no organomegaly, normal bowel sounds heard  extremities: No clubbing; mild lower extremity edema present CNS: Extremely poor historian.  Confused.  No obvious focal deficits noted  lymph: No palpable lymphadenopathy Skin: No obvious petechiae/rashes psych: Currently not agitated.  Flat affect.   Musculoskeletal: No obvious joint erythema/tenderness     Data Reviewed: I have personally reviewed following labs and imaging studies  CBC: Recent Labs  Lab 02/08/23 1227 02/09/23 0407 02/10/23 0431 02/12/23 5784  WBC 9.5 7.0 7.2 4.8  NEUTROABS 7.5  --  4.2 2.7  HGB 11.7* 10.2* 10.8* 11.3*  HCT 37.8* 33.4*  35.6* 36.8*  MCV 97.9 98.8 100.0 98.7  PLT 265 220 238 263   Basic Metabolic Panel: Recent Labs  Lab 02/08/23 1227 02/09/23 0407 02/10/23 0431  NA 140 138 140  K 4.2 3.8 4.0  CL 97* 102 103  CO2 32 27 30  GLUCOSE 93 78 83  BUN 13 12 14   CREATININE 0.76 0.71 0.65  CALCIUM 9.2 8.7* 9.0  MG  --   --  2.0  PHOS  --   --  3.3   GFR: Estimated Creatinine Clearance: 58.1 mL/min (by C-G formula based on SCr of 0.65 mg/dL). Liver Function Tests: Recent Labs  Lab 02/08/23 1227 02/09/23 0407 02/10/23 0431  AST 14* 14*  --   ALT 11 12  --   ALKPHOS 62 53  --   BILITOT 1.0 0.8  --   PROT 6.8 5.5*  --   ALBUMIN 4.0 3.3* 3.2*   No results for input(s): "LIPASE", "AMYLASE" in the last 168 hours. No results for input(s): "AMMONIA" in the last 168 hours. Coagulation Profile: Recent Labs  Lab 02/08/23 1227  INR 1.1   Cardiac Enzymes: No results for input(s): "CKTOTAL", "CKMB", "CKMBINDEX", "TROPONINI" in the last 168 hours. BNP (last 3 results) No results for input(s): "PROBNP" in the last 8760 hours. HbA1C: No results for input(s): "HGBA1C" in the last 72 hours. CBG: No results for input(s): "GLUCAP" in the last 168 hours. Lipid Profile: No results for input(s): "CHOL", "HDL", "LDLCALC", "TRIG", "CHOLHDL", "LDLDIRECT" in the last 72 hours. Thyroid Function Tests: No results for input(s): "TSH", "T4TOTAL", "FREET4", "T3FREE", "THYROIDAB" in the last 72 hours. Anemia Panel: No results for input(s): "VITAMINB12", "FOLATE", "FERRITIN", "TIBC", "IRON", "RETICCTPCT" in the last 72 hours. Sepsis Labs: Recent Labs  Lab 02/08/23 1338  LATICACIDVEN 1.2    Recent Results (from the past 240 hour(s))  Culture, blood (Routine x 2)     Status: None (Preliminary result)   Collection Time: 02/08/23 12:28 PM   Specimen: BLOOD  Result Value Ref Range Status   Specimen Description   Final    BLOOD SITE NOT SPECIFIED Performed at [facility], [address],  [place], [place] [address]    Special Requests   Final    BOTTLES DRAWN AEROBIC AND ANAEROBIC Blood Culture adequate volume Performed at [facility], [address] [place], [place] [address]    Culture   Final    NO GROWTH 4 DAYS Performed at [facility], [address], [place], [place] [address]    Report Status PENDING  Incomplete  Urine Culture     Status: Abnormal   Collection Time: 02/08/23  1:14 PM   Specimen: Urine, Random  Result Value Ref Range Status   Specimen Description   Final    URINE, RANDOM Performed at [facility], [address] [place], [place] [address]    Special Requests   Final    NONE Reflexed from [id number] Performed at [facility], [address], [place], [place] [address]    Culture MULTIPLE SPECIES PRESENT, SUGGEST RECOLLECTION (A)  Final   Report Status 02/09/2023 FINAL  Final  Culture, blood (Routine x 2)     Status: None (Preliminary result)   Collection Time: 02/08/23  8:53 PM   Specimen: BLOOD RIGHT FOREARM  Result Value Ref  Range Status   Specimen Description   Final    BLOOD RIGHT FOREARM Performed at [facility], [address], [place], [place] [address]    Special Requests   Final    BOTTLES DRAWN AEROBIC AND ANAEROBIC Blood Culture adequate volume Performed at [facility], [address] [place], [place] [address]    Culture   Final    NO GROWTH 4 DAYS Performed at [facility], [address], [place], [place] [address]    Report Status PENDING  Incomplete         Radiology Studies: No results found.      Scheduled Meds:  Chlorhexidine Gluconate Cloth  6 each Topical Daily   enoxaparin (LOVENOX) injection  40 mg Subcutaneous Q24H   escitalopram  20 mg Oral Daily   risperiDONE  1 mg Oral QHS   senna-docusate  1 tablet Oral Once   traZODone  100 mg Oral Daily   Continuous Infusions:  cefTRIAXone (ROCEPHIN)  IV 1 g  (02/11/23 1102)          Glade Lloyd, MD [facility] 02/12/2023, 8:12 AM

## 2023-02-13 DIAGNOSIS — N390 Urinary tract infection, site not specified: Secondary | ICD-10-CM

## 2023-02-13 LAB — CULTURE, BLOOD (ROUTINE X 2)
Culture: NO GROWTH
Culture: NO GROWTH
Special Requests: ADEQUATE
Special Requests: ADEQUATE

## 2023-02-13 LAB — BASIC METABOLIC PANEL
Anion gap: 9 (ref 5–15)
BUN: 8 mg/dL (ref 8–23)
CO2: 34 mmol/L — ABNORMAL HIGH (ref 22–32)
Calcium: 9.3 mg/dL (ref 8.9–10.3)
Chloride: 98 mmol/L (ref 98–111)
Creatinine, Ser: 0.63 mg/dL (ref 0.61–1.24)
GFR, Estimated: >60 mL/min (ref >60)
Glucose, Bld: 80 mg/dL (ref 70–99)
Potassium: 3.9 mmol/L (ref 3.5–5.1)
Sodium: 141 mmol/L (ref 135–145)

## 2023-02-13 LAB — MAGNESIUM: Magnesium: 1.9 mg/dL (ref 1.7–2.4)

## 2023-02-13 NOTE — Progress Notes (Signed)
PROGRESS NOTE    Alexander [name]  ZOX:[id number] DOB: 09-06-1932 DOA: 02/08/2023 PCP: [name], MD   Brief Narrative:   89 year old male with medical history significant for Alzheimer's disease anxiety, asthma, BPH, nonobstructive CAD, hypertensive heart disease, COPD, depression, GERD, hyperlipidemia, chronic urinary retention needing chronic Foley catheter and hypertension was brought by his daughter due to fever, altered mental status and malodorous urine.  Workup revealed possible urinary tract infection and volume depletion.  He was started on IV fluids and antibiotics.  Hospice team following.  Patient remains full code.  PT recommending SNF placement.  TOC consulted.  Assessment & Plan:   Possible UTI -Currently on Rocephin.  Blood cultures negative so far.  Urine cultures grew multiple species. -Will complete a course of antibiotics for 7 days -No temperature spikes for the last few days.  Volume depletion -From poor oral intake.  Treated with IV fluids.  Off IV fluids.  Acute metabolic encephalopathy/delirium Moderate dementia with behavioral disturbance Goals of care -Has not required Haldol since 02/10/2023.  Overall prognosis is guarded to poor.  Enrolled with hospice as an outpatient but remains full code.  Hospice following. -Fall precautions.  Delirium precautions.  Continue risperidone  Physical deconditioning -PT recommending SNF placement.  TOC consulted.  Depression -Continue escitalopram along with trazodone at bedtime  Anemia of chronic disease -From chronic illnesses.  Hemoglobin currently stable.  Monitor intermittently  Chronic urinary retention Chronic Foley catheter -Continue Foley catheter.  Outpatient follow-up with urology.  Glaucoma - currently not on therapy.  Outpatient follow-up with ophthalmology  Hyperlipidemia Aortic atherosclerosis -Currently not on medical therapy.  Outpatient follow-up with PCP  Essential  hypertension -Not on any antihypertensives as an outpatient.  Outpatient follow-up.  COPD -Currently stable.  Monitor   DVT prophylaxis: Subcutaneous Lovenox Code Status: Full Family Communication: None at bedside Disposition Plan: Status is: Inpatient Remains inpatient appropriate because: Of severity of illness.  Need for SNF placement    Consultants: Hospice Procedures: None  Antimicrobials: Rocephin from 02/08/2023 onwards   Subjective: Patient seen and examined at bedside.  Extremely poor historian.  Confused.  No seizures, agitation or vomiting reported.   Objective: Vitals:   02/12/23 0429 02/12/23 1219 02/12/23 1947 02/13/23 0439  BP: 139/83 (!) 156/70 (!) 108/96 (!) 144/91  Pulse: 70 (!) 58 63 (!) 56  Resp: 18 17 16 16   Temp: 97.7 F (36.5 C) (!) 97.5 F (36.4 C) 98 F (36.7 C) 98 F (36.7 C)  TempSrc: Axillary Oral Oral   SpO2: 100% 100% 100% 100%  Weight:      Height:        Intake/Output Summary (Last 24 hours) at 02/13/2023 0832 Last data filed at 02/13/2023 0443 Gross per 24 hour  Intake 240 ml  Output 1050 ml  Net -810 ml   Filed Weights   02/08/23 1110  Weight: 63.1 kg    Examination:  General: Currently on 2 L oxygen via nasal cannula.  No distress.  Looks chronically ill and deconditioned ENT/neck: No JVD elevation noted or palpable thyromegaly noted  respiratory: Decreased breath sounds at bases bilaterally with some crackles  CVS: Rate currently controlled; S1 and S2 are heard  abdominal: Soft, nontender, slightly distended; no organomegaly, bowel normally heard  extremities: Trace lower extremity edema present; no cyanosis  CNS: Remains confused and a very poor historian.  No obvious focal deficits noted  lymph: No cervical lymphadenopathy palpable  skin: No obvious ecchymosis/lesions  psych: Mostly flat affect.  Showing no signs of agitation currently.   Musculoskeletal: No obvious joint swelling/deformity   Data Reviewed: I  have personally reviewed following labs and imaging studies  CBC: Recent Labs  Lab 02/08/23 1227 02/09/23 0407 02/10/23 0431 02/12/23 0746  WBC 9.5 7.0 7.2 4.8  NEUTROABS 7.5  --  4.2 2.7  HGB 11.7* 10.2* 10.8* 11.3*  HCT 37.8* 33.4* 35.6* 36.8*  MCV 97.9 98.8 100.0 98.7  PLT 265 220 238 263   Basic Metabolic Panel: Recent Labs  Lab 02/08/23 1227 02/09/23 0407 02/10/23 0431 02/12/23 0746 02/13/23 0450  NA 140 138 140 138 141  K 4.2 3.8 4.0 3.8 3.9  CL 97* 102 103 97* 98  CO2 32 27 30 33* 34*  GLUCOSE 93 78 83 93 80  BUN 13 12 14 10 8   CREATININE 0.76 0.71 0.65 0.67 0.63  CALCIUM 9.2 8.7* 9.0 8.7* 9.3  MG  --   --  2.0 1.9 1.9  PHOS  --   --  3.3  --   --    GFR: Estimated Creatinine Clearance: 58.1 mL/min (by C-G formula based on SCr of 0.63 mg/dL). Liver Function Tests: Recent Labs  Lab 02/08/23 1227 02/09/23 0407 02/10/23 0431  AST 14* 14*  --   ALT 11 12  --   ALKPHOS 62 53  --   BILITOT 1.0 0.8  --   PROT 6.8 5.5*  --   ALBUMIN 4.0 3.3* 3.2*   No results for input(s): "LIPASE", "AMYLASE" in the last 168 hours. No results for input(s): "AMMONIA" in the last 168 hours. Coagulation Profile: Recent Labs  Lab 02/08/23 1227  INR 1.1   Cardiac Enzymes: No results for input(s): "CKTOTAL", "CKMB", "CKMBINDEX", "TROPONINI" in the last 168 hours. BNP (last 3 results) No results for input(s): "PROBNP" in the last 8760 hours. HbA1C: No results for input(s): "HGBA1C" in the last 72 hours. CBG: No results for input(s): "GLUCAP" in the last 168 hours. Lipid Profile: No results for input(s): "CHOL", "HDL", "LDLCALC", "TRIG", "CHOLHDL", "LDLDIRECT" in the last 72 hours. Thyroid Function Tests: No results for input(s): "TSH", "T4TOTAL", "FREET4", "T3FREE", "THYROIDAB" in the last 72 hours. Anemia Panel: No results for input(s): "VITAMINB12", "FOLATE", "FERRITIN", "TIBC", "IRON", "RETICCTPCT" in the last 72 hours. Sepsis Labs: Recent Labs  Lab 02/08/23 1338   LATICACIDVEN 1.2    Recent Results (from the past 240 hour(s))  Culture, blood (Routine x 2)     Status: None   Collection Time: 02/08/23 12:28 PM   Specimen: BLOOD  Result Value Ref Range Status   Specimen Description   Final    BLOOD SITE NOT SPECIFIED Performed at [facility], [address], [place], [place] [address]    Special Requests   Final    BOTTLES DRAWN AEROBIC AND ANAEROBIC Blood Culture adequate volume Performed at [facility], [address] [place], [place] [address]    Culture   Final    NO GROWTH 5 DAYS Performed at [facility], [address] [place], [place] [address]    Report Status 02/13/2023 FINAL  Final  Urine Culture     Status: Abnormal   Collection Time: 02/08/23  1:14 PM   Specimen: Urine, Random  Result Value Ref Range Status   Specimen Description   Final    URINE, RANDOM Performed at [facility], [address] [place], [place] [address]    Special Requests   Final    NONE  Reflexed from [id number] Performed at [facility], [address], [place], [place] [address]    Culture MULTIPLE SPECIES PRESENT, SUGGEST RECOLLECTION (A)  Final   Report Status 02/09/2023 FINAL  Final  Culture, blood (Routine x 2)     Status: None   Collection Time: 02/08/23  8:53 PM   Specimen: BLOOD RIGHT FOREARM  Result Value Ref Range Status   Specimen Description   Final    BLOOD RIGHT FOREARM Performed at [facility], [address], [place], [place] [address]    Special Requests   Final    BOTTLES DRAWN AEROBIC AND ANAEROBIC Blood Culture adequate volume Performed at [facility], [address], [place], [place] [address]    Culture   Final    NO GROWTH 5 DAYS Performed at [facility], [address], [place], [place] [address]    Report Status 02/13/2023 FINAL  Final         Radiology Studies: No results  found.      Scheduled Meds:  Chlorhexidine Gluconate Cloth  6 each Topical Daily   enoxaparin (LOVENOX) injection  40 mg Subcutaneous Q24H   escitalopram  20 mg Oral Daily   risperiDONE  1 mg Oral QHS   senna-docusate  1 tablet Oral Once   traZODone  100 mg Oral Daily   Continuous Infusions:  cefTRIAXone (ROCEPHIN)  IV 1 g (02/12/23 9629)          Glade Lloyd, MD [facility] 02/13/2023, 8:32 AM

## 2023-02-13 NOTE — Plan of Care (Signed)
Plan of Care reviewed. 

## 2023-02-13 NOTE — Progress Notes (Signed)
[name] [phone] [facility] Hospitalized Hospice Patient Visit   Mr. Jameson Rowlette is a current hospice patient, with hospice diagnosis of abnormal weight loss and unspecified dementia, who was admitted to [facility] on 02/08/23 with fever related to likely UTI. [facility] was notified of admission bedside nurse on 02/09/23. Per Dr. Patric Dykes, hospice physician, this is a related hospital admission.   Mr. Mountford is awake and alert with no apparent signs of distress. He continues on IVAB for treatment of UTI which makes him inpatient appropriate. Per hospital record recommendation is rehab at discharge. If patient/family chooses to pursue this family will need to revoke hospice services.  Vital Signs: 98/54/20    178/75    spO2 100% 2L nasal cannula I&O: 441/875 Abnormal labs: None New Diagnostics: None New IV/PRN Meds: Rocephin 1 gram q24H Problem List: Possible UTI -Currently on Rocephin.  Blood cultures negative so far.  Urine cultures grew multiple species. -Will complete a course of antibiotics for 7 days -No temperature spikes for the last few days.   Volume depletion -From poor oral intake.  Treated with IV fluids.  Off IV fluids.   Acute metabolic encephalopathy/delirium Moderate dementia with behavioral disturbance Goals of care -Has not required Haldol since 02/10/2023.  Overall prognosis is guarded to poor.  Enrolled with hospice as an outpatient but remains full code.  Hospice following. -Fall precautions.  Delirium precautions.  Continue risperidone   Discharge Planning: Ongoing, d/c once IVAB complete Family Contact: Message left for daughter IDT: Updated Goals of Care: Full code Thea Gist, BSN RN Hospice hospital liaison [phone]

## 2023-02-13 NOTE — Plan of Care (Signed)

## 2023-02-14 DIAGNOSIS — I10 Essential (primary) hypertension: Secondary | ICD-10-CM

## 2023-02-14 DIAGNOSIS — K2100 Gastro-esophageal reflux disease with esophagitis, without bleeding: Secondary | ICD-10-CM

## 2023-02-14 DIAGNOSIS — F03B18 Unspecified dementia, moderate, with other behavioral disturbance: Secondary | ICD-10-CM | POA: Diagnosis not present

## 2023-02-14 DIAGNOSIS — N3000 Acute cystitis without hematuria: Secondary | ICD-10-CM | POA: Diagnosis not present

## 2023-02-14 DIAGNOSIS — J410 Simple chronic bronchitis: Secondary | ICD-10-CM

## 2023-02-14 LAB — CBC WITH DIFFERENTIAL/PLATELET
Abs Immature Granulocytes: 0.01 10*3/uL (ref 0.00–0.07)
Basophils Absolute: 0 10*3/uL (ref 0.0–0.1)
Basophils Relative: 1 %
Eosinophils Absolute: 0.2 10*3/uL (ref 0.0–0.5)
Eosinophils Relative: 5 %
HCT: 32.4 % — ABNORMAL LOW (ref 39.0–52.0)
Hemoglobin: 10 g/dL — ABNORMAL LOW (ref 13.0–17.0)
Immature Granulocytes: 0 %
Lymphocytes Relative: 30 %
Lymphs Abs: 1.3 10*3/uL (ref 0.7–4.0)
MCH: 30.3 pg (ref 26.0–34.0)
MCHC: 30.9 g/dL (ref 30.0–36.0)
MCV: 98.2 fL (ref 80.0–100.0)
Monocytes Absolute: 0.5 10*3/uL (ref 0.1–1.0)
Monocytes Relative: 12 %
Neutro Abs: 2.2 10*3/uL (ref 1.7–7.7)
Neutrophils Relative %: 52 %
Platelets: 241 10*3/uL (ref 150–400)
RBC: 3.3 MIL/uL — ABNORMAL LOW (ref 4.22–5.81)
RDW: 12.2 % (ref 11.5–15.5)
WBC: 4.3 10*3/uL (ref 4.0–10.5)
nRBC: 0 % (ref 0.0–0.2)

## 2023-02-14 LAB — BASIC METABOLIC PANEL
Anion gap: 8 (ref 5–15)
BUN: 14 mg/dL (ref 8–23)
CO2: 33 mmol/L — ABNORMAL HIGH (ref 22–32)
Calcium: 8.8 mg/dL — ABNORMAL LOW (ref 8.9–10.3)
Chloride: 97 mmol/L — ABNORMAL LOW (ref 98–111)
Creatinine, Ser: 0.78 mg/dL (ref 0.61–1.24)
GFR, Estimated: >60 mL/min (ref >60)
Glucose, Bld: 88 mg/dL (ref 70–99)
Potassium: 3.9 mmol/L (ref 3.5–5.1)
Sodium: 138 mmol/L (ref 135–145)

## 2023-02-14 LAB — MAGNESIUM: Magnesium: 2 mg/dL (ref 1.7–2.4)

## 2023-02-14 NOTE — Progress Notes (Signed)
[facility]                                                                              Alexander [name], is a 89 y.o. male, DOB - 1932-08-29, QQV:[id number] Admit date - 02/08/2023    Outpatient Primary MD for the patient is [name], MD  LOS - 6  days  Chief Complaint  Patient presents with   Fever       Brief summary    89 year old male with medical history significant for Alzheimer's disease anxiety, asthma, BPH, nonobstructive CAD, hypertensive heart disease, COPD, depression, GERD, hyperlipidemia, chronic urinary retention needing chronic Foley catheter and hypertension was brought by his daughter due to fever, altered mental status and malodorous urine.  Workup revealed possible urinary tract infection and volume depletion.  He was started on IV fluids and antibiotics.  Hospice team following.  Patient remains full code.  PT recommending SNF placement.   Assessment & Plan    Principal Problem:   Acute UTI (urinary tract infection) -Currently on Rocephin.  Blood cultures negative so far.  Urine cultures grew multiple species. -Complete course of antibiotics for 7 days    Acute metabolic encephalopathy/delirium in the setting of dementia --Has not required Haldol since 02/10/2023.  Overall prognosis is guarded to poor. -  Enrolled with hospice as an outpatient but remains full code.  Hospice following. -Fall precautions.  Delirium precautions.  - Continue risperidone   Physical deconditioning -PT recommending SNF placement.  TOC consulted.   Depression -Continue escitalopram, trazodone   Anemia of chronic disease -From chronic illnesses.   -H&H stable    Chronic urinary retention with chronic Foley catheter -Continue Foley catheter.  Outpatient follow-up with urology.   Glaucoma - currently not on therapy.  Outpatient follow-up with ophthalmology   Hyperlipidemia Aortic atherosclerosis -Currently not on medical therapy.   Outpatient follow-up with PCP   Essential hypertension -Not on any antihypertensives as an outpatient.  Outpatient follow-up.   COPD -Currently stable.  Monitor   Pressure Injury Documentation: Pressure Injury 02/11/23 Sacrum (Active)  02/11/23   Location: Sacrum  Location Orientation:   Staging:   Wound Description (Comments):   Present on Admission:   Dressing Type Foam - Lift dressing to assess site every shift 02/13/23 2000   Estimated body mass index is 21.15 kg/m as calculated from the following:   Height as of this encounter: 5\' 8"  (1.727 m).   Weight as of this encounter: 63.1 kg.  Code Status: Full code DVT Prophylaxis:  enoxaparin (LOVENOX) injection 40 mg Start: 02/08/23 2200   Level of Care: Level of care: Med-Surg Family Communication: Updated patient's daughter at the bedside Disposition Plan:      Remains inpatient appropriate: PT recommended SNF   Procedures:    Consultants:   Palliative medicine  Antimicrobials:   Anti-infectives (From admission, onward)    Start     Dose/Rate Route Frequency Ordered Stop   02/09/23 1000  cefTRIAXone (ROCEPHIN) 1 g in sodium chloride 0.9 % 100 mL IVPB        1 g 200 mL/hr over 30 Minutes Intravenous Every 24  hours 02/08/23 1537     02/08/23 1145  cefTRIAXone (ROCEPHIN) 1 g in sodium chloride 0.9 % 100 mL IVPB        1 g 200 mL/hr over 30 Minutes Intravenous  Once 02/08/23 1133 02/08/23 1330          Medications  Chlorhexidine Gluconate Cloth  6 each Topical Daily   enoxaparin (LOVENOX) injection  40 mg Subcutaneous Q24H   escitalopram  20 mg Oral Daily   risperiDONE  1 mg Oral QHS   senna-docusate  1 tablet Oral Once   traZODone  100 mg Oral Daily      Subjective:   Alexander [name] was seen and examined today.  Patient unable to provide any review of system, daughter at the bedside.  No acute issues overnight.  No fevers, nausea or vomiting.   Objective:   Vitals:   02/13/23 1204 02/13/23 2013  02/14/23 0632 02/14/23 1212  BP: (!) 178/75 112/70 137/67 (!) 143/67  Pulse: (!) 54 68 (!) 101 61  Resp: 20 18 16 18   Temp: 98 F (36.7 C) 98.8 F (37.1 C) 97.8 F (36.6 C) 97.8 F (36.6 C)  TempSrc:  Oral Oral Oral  SpO2: 100% 100% (!) 38% 100%  Weight:      Height:        Intake/Output Summary (Last 24 hours) at 02/14/2023 1517 Last data filed at 02/14/2023 0900 Gross per 24 hour  Intake 340 ml  Output 450 ml  Net -110 ml     Wt Readings from Last 3 Encounters:  02/08/23 63.1 kg  12/04/22 63.1 kg  10/24/22 57.2 kg     Exam General: Awake keeping his eyes closed, NAD Cardiovascular: S1 S2 auscultated,  RRR Respiratory: Clear to auscultation bilaterally, no wheezing, rales or rhonchi Gastrointestinal: Soft, nontender, nondistended, + bowel sounds Ext: no pedal edema bilaterally Neuro: not following any commands Skin: No rashes Psych: dementia    Data Reviewed:  I have personally reviewed following labs    CBC Lab Results  Component Value Date   WBC 4.3 02/14/2023   RBC 3.30 (L) 02/14/2023   HGB 10.0 (L) 02/14/2023   HCT 32.4 (L) 02/14/2023   MCV 98.2 02/14/2023   MCH 30.3 02/14/2023   PLT 241 02/14/2023   MCHC 30.9 02/14/2023   RDW 12.2 02/14/2023   LYMPHSABS 1.3 02/14/2023   MONOABS 0.5 02/14/2023   EOSABS 0.2 02/14/2023   BASOSABS 0.0 02/14/2023     Last metabolic panel Lab Results  Component Value Date   NA 138 02/14/2023   K 3.9 02/14/2023   CL 97 (L) 02/14/2023   CO2 33 (H) 02/14/2023   BUN 14 02/14/2023   CREATININE 0.78 02/14/2023   GLUCOSE 88 02/14/2023   GFRNONAA >60 02/14/2023   GFRAA >60 07/09/2019   CALCIUM 8.8 (L) 02/14/2023   PHOS 3.3 02/10/2023   PROT 5.5 (L) 02/09/2023   ALBUMIN 3.2 (L) 02/10/2023   BILITOT 0.8 02/09/2023   ALKPHOS 53 02/09/2023   AST 14 (L) 02/09/2023   ALT 12 02/09/2023   ANIONGAP 8 02/14/2023    CBG (last 3)  No results for input(s): "GLUCAP" in the last 72 hours.    Coagulation  Profile: Recent Labs  Lab 02/08/23 1227  INR 1.1     Radiology Studies: I have personally reviewed the imaging studies  No results found.     Thad Ranger M.D. [facility] 02/14/2023, 3:17 PM  Available via Epic secure chat 7am-7pm After 7 pm,  please refer to night coverage provider listed on amion.

## 2023-02-14 NOTE — Plan of Care (Signed)
  Problem: Fluid Volume: Goal: Hemodynamic stability will improve Outcome: Not Progressing   Problem: Clinical Measurements: Goal: Diagnostic test results will improve Outcome: Not Progressing Goal: Signs and symptoms of infection will decrease Outcome: Not Progressing   Problem: Respiratory: Goal: Ability to maintain adequate ventilation will improve Outcome: Not Progressing   Problem: Education: Goal: Knowledge of General Education information will improve Description: Including pain rating scale, medication(s)/side effects and non-pharmacologic comfort measures Outcome: Not Progressing   Problem: Clinical Measurements: Goal: Ability to maintain clinical measurements within normal limits will improve Outcome: Not Progressing Goal: Will remain free from infection Outcome: Not Progressing Goal: Diagnostic test results will improve Outcome: Not Progressing Goal: Respiratory complications will improve Outcome: Not Progressing Goal: Cardiovascular complication will be avoided Outcome: Not Progressing   Problem: Health Behavior/Discharge Planning: Goal: Ability to manage health-related needs will improve Outcome: Not Progressing

## 2023-02-14 NOTE — Progress Notes (Signed)
[name] [phone] [facility] Hospitalized Hospice Patient Visit   [name] is a current hospice patient, with hospice diagnosis of abnormal weight loss and unspecified dementia, who was admitted to [facility] on 02/08/23 with fever related to likely UTI. [facility] was notified of admission bedside nurse on 02/09/23. Per Dr. Patric [name], hospice physician, this is a related hospital admission.   Alexander [name] is awake and alert with no apparent signs of distress. His daughter Alexander [name] is at bedside and we discuss ongoing care. At this time Alexander [name] wants to take him home once he is done with his treatment for UTI but also notes that he is much weaker and she is unsure if she can handle him. We discuss the option of rehab for some strengthening but that this will mean he will need to revoke is hospice medicare benefit.  He remains inpatient appropriate at this time due to need for IVAB.  Vital Signs: 97.8/91/18    143/67     100% on 2L nasal cannula I&O: 120/250 Abnormal labs: Chloride 97, Ca+ 8.8,  Diagnostics: None New IV/PRN Meds: Rocephin 1 gram q24H Problem List: Acute UTI (urinary tract infection) -Currently on Rocephin.  Blood cultures negative so far.  Urine cultures grew multiple species. -Complete course of antibiotics for 7 days  Acute metabolic encephalopathy/delirium in the setting of dementia --Has not required Haldol since 02/10/2023.  Overall prognosis is guarded to poor. -  Enrolled with hospice as an outpatient but remains full code.  Hospice following. -Fall precautions.  Delirium precautions.  - Continue risperidone Physical deconditioning -PT recommending SNF placement.  TOC consulted.  Depression -Continue escitalopram, trazodone  Anemia of chronic disease -From chronic illnesses.   -H&H stable  Discharge Planning: Ongoing, d/c once IVAB complete, possibly SNF rehab Family Contact: talked with daughter Alexander [name] at bedside IDT: Updated Goals  of Care: Full code Alexander [name], BSN RN Hospice hospital liaison [phone]

## 2023-02-14 NOTE — TOC Progression Note (Addendum)
Transition of Care [facility]) - Progression Note    Patient Details  Name: Alexander [name] MRN: [id number] Date of Birth: 12-15-1932  Transition of Care [facility]) CM/SW Contact  Harriett Sine, RN Phone Number: 02/14/2023, 12:08 PM  Clinical Narrative:    Spoke with pt and daughter [name]  [facility]) at bedside about SNF placement, medicare programs and d/c plans. Gave SNF list to [facility] and referred her to medicare .gov for more details. Gave her pt advocate information for assistance with the different programs to assist seniors.   PASRR [id number] H, FL2 ready, bed request faxed out, no SDOH needs at this time, TOC following  Called [facility] [phone] Adult Day program left message with Jonny Ruiz. Sonja called to state she spoke with someone at [facility] program about SNF rehab placement and was told his Day program was a scholarship and does not  affect his insurance and SNF placement.   Expected Discharge Plan: Home w Home Health Services Barriers to Discharge: No Barriers Identified  Expected Discharge Plan and Services       Living arrangements for the past 2 months: Single Family Home                 DME Arranged:  (waiting for pt eval)                     Social Determinants of Health (SDOH) Interventions SDOH Screenings   Food Insecurity: No Food Insecurity (02/09/2023)  Housing: Patient Declined (02/09/2023)  Transportation Needs: No Transportation Needs (02/09/2023)  Utilities: Not At Risk (02/09/2023)  Depression (PHQ2-9): Low Risk  (12/16/2020)  Financial Resource Strain: Low Risk  (09/07/2017)  Physical Activity: Sufficiently Active (09/07/2017)  Social Connections: Socially Integrated (09/07/2017)  Stress: No Stress Concern Present (09/07/2017)  Tobacco Use: Medium Risk (02/08/2023)    Readmission Risk Interventions    11/30/2022    9:58 AM  Readmission Risk Prevention Plan  Transportation Screening Complete  PCP or Specialist Appt within 5-7 Days  Complete  Home Care Screening Complete  Medication Review (RN CM) Complete

## 2023-02-14 NOTE — [place] FL2 (Signed)
[place] MEDICAID FL2 LEVEL OF CARE FORM     IDENTIFICATION  Patient Name: Alexander [name] Birthdate: 1932-08-05 Sex: male Admission Date (Current Location): 02/08/2023  [facility] and [place] Number:  Producer, television/film/video and Address:  [facility],  [address] [place], [place] [address]      Provider Number: [id number]  Attending Physician Name and Address:  Cathren Harsh, MD  Relative Name and Phone Number:  Lovenia Kim (Daughter)  [phone] (Mobile)    Current Level of Care: Hospital Recommended Level of Care: Skilled Nursing Facility Prior Approval Number:    Date Approved/Denied:   PASRR Number: [id number]  Discharge Plan: SNF    Current Diagnoses: Patient Active Problem List   Diagnosis Date Noted   Fever 02/08/2023   Acute UTI (urinary tract infection) 02/08/2023   Malnutrition of moderate degree 12/05/2022   Bacteremia due to Klebsiella pneumoniae 12/01/2022   Sepsis secondary to UTI (HCC) 11/29/2022   COPD exacerbation (HCC) 11/29/2020   Acute hypoxemic respiratory failure (HCC) 11/29/2020   Moderate dementia with behavioral disturbance 11/29/2020   COVID-19 04/11/2020   Syncope and collapse 04/11/2020   Right shoulder pain 02/19/2019   Hematochezia 01/03/2019   Bilateral hearing loss 10/06/2018   Dementia without behavioral disturbance (HCC) 09/02/2018   Acute respiratory failure with hypoxia (HCC) 09/02/2018   Rash 05/30/2018   Blood in ear canal, left 05/21/2018   Dermatitis 04/12/2018   Preventative health care 11/27/2017   Weight loss 08/27/2017   Abdominal discomfort, generalized 08/27/2017   Anemia 08/27/2017   Hyponatremia 08/27/2017   Chronic obstructive pulmonary disease (HCC) 06/05/2017   Chronic pansinusitis 06/05/2017   BPH with obstruction/lower urinary tract symptoms 01/30/2017   Gynecomastia, male 09/06/2016   Chest pain 05/21/2016   Hyperglycemia 05/09/2016   Depression 05/09/2016   Rhinitis, chronic  05/09/2016   Upper airway cough syndrome 03/18/2016   Hypertensive heart disease    Dark stools 11/14/2015   Screening for prostate cancer 05/06/2015   Thrush 05/06/2015   DOE (dyspnea on exertion) 07/31/2014   Constipation 01/27/2014   Glaucoma    Erectile dysfunction    Left ventricular hypertrophy 12/01/2012   GERD (gastroesophageal reflux disease) 10/28/2012   COPD, moderate (HCC) 10/22/2012   HLD (hyperlipidemia) 10/11/2009   Essential hypertension 10/11/2009   Cough, persistent 10/11/2009    Orientation RESPIRATION BLADDER Height & Weight     Self  Normal Incontinent Weight: 63.1 kg Height:  5\' 8"  (172.7 cm)  BEHAVIORAL SYMPTOMS/MOOD NEUROLOGICAL BOWEL NUTRITION STATUS      Continent Diet  AMBULATORY STATUS COMMUNICATION OF NEEDS Skin   Limited Assist Verbally Normal                       Personal Care Assistance Level of Assistance  Bathing, Dressing, Total care, Feeding Bathing Assistance: Limited assistance Feeding assistance: Limited assistance Dressing Assistance: Limited assistance Total Care Assistance: Limited assistance   Functional Limitations Info  Sight, Hearing, Speech Sight Info: Adequate (glasses) Hearing Info: Adequate Speech Info: Adequate    SPECIAL CARE FACTORS FREQUENCY  PT (By licensed PT), OT (By licensed OT)     PT Frequency: 5 X per week OT Frequency: 5X per week            Contractures Contractures Info: Not present    Additional Factors Info                  Current Medications (02/14/2023):  This is the current hospital  active medication list Current Facility-Administered Medications  Medication Dose Route Frequency Provider Last Rate Last Admin   acetaminophen (TYLENOL) tablet 650 mg  650 mg Oral Q6H PRN [name], MD       Or   acetaminophen (TYLENOL) suppository 650 mg  650 mg Rectal Q6H PRN [name], MD       albuterol (PROVENTIL) (2.5 MG/3ML) 0.083% nebulizer solution 2.5 mg  2.5 mg  Nebulization Q6H PRN [name], MD       cefTRIAXone (ROCEPHIN) 1 g in sodium chloride 0.9 % 100 mL IVPB  1 g Intravenous Q24H [name], MD 200 mL/hr at 02/14/23 0804 1 g at 02/14/23 0804   Chlorhexidine Gluconate Cloth 2 % PADS 6 each  6 each Topical Daily Berton Mount I, MD   6 each at 02/13/23 1049   enoxaparin (LOVENOX) injection 40 mg  40 mg Subcutaneous Q24H [name], MD   40 mg at 02/13/23 2239   escitalopram (LEXAPRO) tablet 20 mg  20 mg Oral Daily [name], MD   20 mg at 02/13/23 1014   haloperidol lactate (HALDOL) injection 2 mg  2 mg Intravenous Q6H PRN [name], MD   2 mg at 02/10/23 1624   ondansetron (ZOFRAN) tablet 4 mg  4 mg Oral Q6H PRN [name], MD       Or   ondansetron [facility]) injection 4 mg  4 mg Intravenous Q6H PRN [name], MD       risperiDONE (RISPERDAL) tablet 1 mg  1 mg Oral QHS [name], MD   1 mg at 02/13/23 2239   senna-docusate (Senokot-S) tablet 1 tablet  1 tablet Oral Once Steffanie Rainwater, MD       traZODone (DESYREL) tablet 100 mg  100 mg Oral Daily [name], MD   100 mg at 02/13/23 2239     Discharge Medications: Please see discharge summary for a list of discharge medications.  Relevant Imaging Results:  Relevant Lab Results:   Additional Information    Harriett Sine, RN

## 2023-02-15 DIAGNOSIS — N3000 Acute cystitis without hematuria: Secondary | ICD-10-CM | POA: Diagnosis not present

## 2023-02-15 DIAGNOSIS — E782 Mixed hyperlipidemia: Secondary | ICD-10-CM

## 2023-02-15 DIAGNOSIS — F03B18 Unspecified dementia, moderate, with other behavioral disturbance: Secondary | ICD-10-CM

## 2023-02-15 NOTE — Progress Notes (Signed)
[facility]                                                                              Alexander [name], is a 89 y.o. male, DOB - January 26, 1933, GMW:[id number] Admit date - 02/08/2023    Outpatient Primary MD for the patient is [name], MD  LOS - 7  days  Chief Complaint  Patient presents with   Fever       Brief summary    89 year old male with medical history significant for Alzheimer's disease anxiety, asthma, BPH, nonobstructive CAD, hypertensive heart disease, COPD, depression, GERD, hyperlipidemia, chronic urinary retention needing chronic Foley catheter and hypertension was brought by his daughter due to fever, altered mental status and malodorous urine.  Workup revealed possible urinary tract infection and volume depletion.  He was started on IV fluids and antibiotics.  Hospice team following.  Patient remains full code.  PT recommending SNF placement.   Assessment & Plan    Principal Problem:   Acute UTI (urinary tract infection) -Currently on Rocephin.  Blood cultures negative so far.  Urine cultures grew multiple species. -Complete course of antibiotics for 7 days    Acute metabolic encephalopathy/delirium in the setting of dementia - Overall prognosis is guarded to poor. -  Enrolled with hospice as an outpatient but remains full code.  Hospice following. - Fall precautions.  Delirium precautions.  - Continue risperidone -Overnight was confused and agitated, required mittens, Haldol 2 mg   Physical deconditioning -PT recommending SNF placement.  TOC consulted.   Depression -Continue escitalopram, trazodone   Anemia of chronic disease -From chronic illnesses.   -H&H stable    Chronic urinary retention with chronic Foley catheter -Continue Foley catheter.  Outpatient follow-up with urology.   Glaucoma - currently not on therapy.  Outpatient follow-up with ophthalmology   Hyperlipidemia Aortic atherosclerosis -Currently not on  medical therapy.  Outpatient follow-up with PCP   Essential hypertension -Not on any antihypertensives as an outpatient.  Outpatient follow-up.   COPD -Currently stable.  Monitor   Pressure Injury Documentation: Pressure Injury 02/11/23 Sacrum Mid Stage 1 -  Intact skin with non-blanchable redness of a localized area usually over a bony prominence. (Active)  02/11/23   Location: Sacrum  Location Orientation: Mid  Staging: Stage 1 -  Intact skin with non-blanchable redness of a localized area usually over a bony prominence.  Wound Description (Comments):   Present on Admission: No  Dressing Type Foam - Lift dressing to assess site every shift 02/15/23 0915   Estimated body mass index is 21.15 kg/m as calculated from the following:   Height as of this encounter: 5\' 8"  (1.727 m).   Weight as of this encounter: 63.1 kg.  Code Status: Full code DVT Prophylaxis:  enoxaparin (LOVENOX) injection 40 mg Start: 02/08/23 2200   Level of Care: Level of care: Med-Surg Family Communication: Updated patient's daughter at the bedside on 10/23 Disposition Plan:      Remains inpatient appropriate: PT recommended SNF   Procedures:    Consultants:   Palliative medicine  Antimicrobials:   Anti-infectives (From admission, onward)    Start  Dose/Rate Route Frequency Ordered Stop   02/09/23 1000  cefTRIAXone (ROCEPHIN) 1 g in sodium chloride 0.9 % 100 mL IVPB        1 g 200 mL/hr over 30 Minutes Intravenous Every 24 hours 02/08/23 1537     02/08/23 1145  cefTRIAXone (ROCEPHIN) 1 g in sodium chloride 0.9 % 100 mL IVPB        1 g 200 mL/hr over 30 Minutes Intravenous  Once 02/08/23 1133 02/08/23 1330          Medications  Chlorhexidine Gluconate Cloth  6 each Topical Daily   enoxaparin (LOVENOX) injection  40 mg Subcutaneous Q24H   escitalopram  20 mg Oral Daily   risperiDONE  1 mg Oral QHS   senna-docusate  1 tablet Oral Once   traZODone  100 mg Oral Daily      Subjective:    Alexander [name] was seen and examined today.  This morning, somewhat more alert and awake, oriented to self and place.  Overnight was agitated, was placed on mittens and received Haldol.    Objective:   Vitals:   02/14/23 2012 02/14/23 2216 02/15/23 0526 02/15/23 1135  BP: (!) 146/70  (!) 136/57 112/66  Pulse: 68 (!) 50 84 65  Resp: 18 18 16 16   Temp: 98 F (36.7 C)  (!) 97.5 F (36.4 C) 98.4 F (36.9 C)  TempSrc: Oral  Oral Oral  SpO2: (!) 85% 100% 100% 100%  Weight:      Height:        Intake/Output Summary (Last 24 hours) at 02/15/2023 1259 Last data filed at 02/15/2023 0900 Gross per 24 hour  Intake 848 ml  Output 975 ml  Net -127 ml     Wt Readings from Last 3 Encounters:  02/08/23 63.1 kg  12/04/22 63.1 kg  10/24/22 57.2 kg   Physical Exam General: Alert, oriented to self and place, knows he is in the hospital Cardiovascular: S1 S2 clear, RRR.  Respiratory: CTAB, no wheezing Gastrointestinal: Soft, nontender, nondistended, NBS Ext: no pedal edema bilaterally Neuro: follows commands intermittently, no acute deficits Psych: dementia    Data Reviewed:  I have personally reviewed following labs    CBC Lab Results  Component Value Date   WBC 4.3 02/14/2023   RBC 3.30 (L) 02/14/2023   HGB 10.0 (L) 02/14/2023   HCT 32.4 (L) 02/14/2023   MCV 98.2 02/14/2023   MCH 30.3 02/14/2023   PLT 241 02/14/2023   MCHC 30.9 02/14/2023   RDW 12.2 02/14/2023   LYMPHSABS 1.3 02/14/2023   MONOABS 0.5 02/14/2023   EOSABS 0.2 02/14/2023   BASOSABS 0.0 02/14/2023     Last metabolic panel Lab Results  Component Value Date   NA 138 02/14/2023   K 3.9 02/14/2023   CL 97 (L) 02/14/2023   CO2 33 (H) 02/14/2023   BUN 14 02/14/2023   CREATININE 0.78 02/14/2023   GLUCOSE 88 02/14/2023   GFRNONAA >60 02/14/2023   GFRAA >60 07/09/2019   CALCIUM 8.8 (L) 02/14/2023   PHOS 3.3 02/10/2023   PROT 5.5 (L) 02/09/2023   ALBUMIN 3.2 (L) 02/10/2023   BILITOT 0.8 02/09/2023    ALKPHOS 53 02/09/2023   AST 14 (L) 02/09/2023   ALT 12 02/09/2023   ANIONGAP 8 02/14/2023    CBG (last 3)  No results for input(s): "GLUCAP" in the last 72 hours.    Coagulation Profile: No results for input(s): "INR", "PROTIME" in the last 168 hours.    Radiology  Studies: I have personally reviewed the imaging studies  No results found.     Thad Ranger M.D. [facility] 02/15/2023, 12:59 PM  Available via Epic secure chat 7am-7pm After 7 pm, please refer to night coverage provider listed on amion.

## 2023-02-15 NOTE — Progress Notes (Signed)
Alexander [name] [phone] [facility] Hospitalized Hospice Patient Visit   [name] is a current hospice patient, with hospice diagnosis of abnormal weight loss and unspecified dementia, who was admitted to [facility] on 02/08/23 with fever related to likely UTI. [facility] was notified of admission bedside nurse on 02/09/23. Per Dr. Patric [name], hospice physician, this is a related hospital admission.   Alexander [name] is resting with eyes closed today. Per report he was awake and agitated during the night and required IV medication for same. Hospital TOC continues to discuss possible rehab with patient.   He remains inpatient appropriate at this time due to need for IVAB.  Vital Signs: 98.2/91/17    115/66     100%  2L nasal cannula I&O: 965/575 Abnormal labs: None New  Diagnostics: None New IV/PRN Meds: Rocephin 1 gram q24H Problem List: Acute UTI (urinary tract infection) -Currently on Rocephin.  Blood cultures negative so far.  Urine cultures grew multiple species. -Complete course of antibiotics for 7 days     Acute metabolic encephalopathy/delirium in the setting of dementia - Overall prognosis is guarded to poor. -  Enrolled with hospice as an outpatient but remains full code.  Hospice following. - Fall precautions.  Delirium precautions.  - Continue risperidone -Overnight was confused and agitated, required mittens, Haldol 2 mg   Physical deconditioning -PT recommending SNF placement.  TOC consulted.   Discharge Planning: Ongoing, d/c once IVAB complete, possibly SNF rehab Family Contact: no family contact today IDT: Updated Goals of Care: Full code Alexander [name], BSN RN Hospice hospital liaison [phone]

## 2023-02-15 NOTE — Progress Notes (Signed)
Pt was agitated and trying to pull his Foley cath at the beginning of the night shift. Soft mittens applied. Haldol 2 mg IV PRN given. His vital sings has remained stable. Normal respiratory efforts with 2 LPM of NCL. Pt has been able to sleep well after administered Haldol x 1. We will continue to monitor.   Filiberto Pinks, RN

## 2023-02-15 NOTE — TOC Progression Note (Signed)
Transition of Care [facility]) - Progression Note    Patient Details  Name: Alexander [name] MRN: [id number] Date of Birth: 14-Jan-1933  [facility] CM/SW Contact  Harriett Sine, RN Phone Number: 02/15/2023, 2:35 PM  Clinical Narrative:    Spoke with daughter [name] about SNF list given to her yesterday, for  the family's choice. [name] stated she misunderstood what she was to do and that she would call NCM back with a choices today.    Expected Discharge Plan: Home w Home Health Services Barriers to Discharge: No Barriers Identified  Expected Discharge Plan and Services       Living arrangements for the past 2 months: Single Family Home                 DME Arranged:  (waiting for pt eval)                     Social Determinants of Health (SDOH) Interventions SDOH Screenings   Food Insecurity: No Food Insecurity (02/09/2023)  Housing: Patient Declined (02/09/2023)  Transportation Needs: No Transportation Needs (02/09/2023)  Utilities: Not At Risk (02/09/2023)  Depression (PHQ2-9): Low Risk  (12/16/2020)  Financial Resource Strain: Low Risk  (09/07/2017)  Physical Activity: Sufficiently Active (09/07/2017)  Social Connections: Socially Integrated (09/07/2017)  Stress: No Stress Concern Present (09/07/2017)  Tobacco Use: Medium Risk (02/08/2023)    Readmission Risk Interventions    11/30/2022    9:58 AM  Readmission Risk Prevention Plan  Transportation Screening Complete  PCP or Specialist Appt within 5-7 Days Complete  Home Care Screening Complete  Medication Review (RN CM) Complete

## 2023-02-15 NOTE — Progress Notes (Signed)
PT Cancellation Note  Patient Details Name: Alexander [name] MRN: [id number] DOB: [birth year]   Cancelled Treatment:    Reason Eval/Treat Not Completed: Fatigue/lethargy limiting ability to participate. Pt recently finished eating with nursing, pt resting soundly. Attempted to awaken pt with lights on, elevated HOB, loud voice, sternal rub but pt only states "yeah" and doesn't open eyes despite multiple attempts. Will continue to follow acutely.    Domenick Bookbinder PT, DPT 02/15/23, 3:22 PM

## 2023-02-15 NOTE — Plan of Care (Signed)

## 2023-02-16 DIAGNOSIS — F03B18 Unspecified dementia, moderate, with other behavioral disturbance: Secondary | ICD-10-CM | POA: Diagnosis not present

## 2023-02-16 DIAGNOSIS — N390 Urinary tract infection, site not specified: Secondary | ICD-10-CM | POA: Diagnosis not present

## 2023-02-16 DIAGNOSIS — I10 Essential (primary) hypertension: Secondary | ICD-10-CM | POA: Diagnosis not present

## 2023-02-16 NOTE — TOC Progression Note (Addendum)
Transition of Care [facility]) - Progression Note    Patient Details  Name: Alexander [name] MRN: [id number] Date of Birth: 09-Jul-1932  Transition of Care [facility]) CM/SW Contact  Harriett Sine, RN Phone Number: 02/16/2023, 3:11 PM  Clinical Narrative:     Spoke with pt daughter [name] about SNF choice. Called [facility] to confirm availability, Lowella Bandy stated Monday placement is a possibility she could not give an exact date. Pt has traditional medicare and family has to discharge from hospice care.   Expected Discharge Plan: Home w Home Health Services Barriers to Discharge: No Barriers Identified  Expected Discharge Plan and Services       Living arrangements for the past 2 months: Single Family Home                 DME Arranged:  (waiting for pt eval)                     Social Determinants of Health (SDOH) Interventions SDOH Screenings   Food Insecurity: No Food Insecurity (02/09/2023)  Housing: Patient Declined (02/09/2023)  Transportation Needs: No Transportation Needs (02/09/2023)  Utilities: Not At Risk (02/09/2023)  Depression (PHQ2-9): Low Risk  (12/16/2020)  Financial Resource Strain: Low Risk  (09/07/2017)  Physical Activity: Sufficiently Active (09/07/2017)  Social Connections: Socially Integrated (09/07/2017)  Stress: No Stress Concern Present (09/07/2017)  Tobacco Use: Medium Risk (02/08/2023)    Readmission Risk Interventions    11/30/2022    9:58 AM  Readmission Risk Prevention Plan  Transportation Screening Complete  PCP or Specialist Appt within 5-7 Days Complete  Home Care Screening Complete  Medication Review (RN CM) Complete

## 2023-02-16 NOTE — Plan of Care (Signed)

## 2023-02-16 NOTE — Progress Notes (Signed)
[facility]                                                                              Alexander [name], is a 89 y.o. male, DOB - 05/23/1932, RUE:[id number] Admit date - 02/08/2023    Outpatient Primary MD for the patient is [name], MD  LOS - 8  days  Chief Complaint  Patient presents with   Fever       Brief summary    89 year old male with medical history significant for Alzheimer's disease anxiety, asthma, BPH, nonobstructive CAD, hypertensive heart disease, COPD, depression, GERD, hyperlipidemia, chronic urinary retention needing chronic Foley catheter and hypertension was brought by his daughter due to fever, altered mental status and malodorous urine.  Workup revealed possible urinary tract infection and volume depletion.  He was started on IV fluids and antibiotics.  Hospice team following.  Patient remains full code.  PT recommending SNF placement.   Assessment & Plan    Principal Problem:   SIRS with Acute UTI (urinary tract infection) UTI possibly related to chronic Foley catheter -Currently on Rocephin.  Blood cultures negative so far.  Urine cultures grew multiple species. -Complete course of antibiotics for 7 days    Acute metabolic encephalopathy/delirium in the setting of dementia - Overall prognosis is guarded to poor.  Enrolled with hospice as an outpatient but remains full code.  Hospice following. - Fall precautions.  Delirium precautions.  - Continue risperidone -Has completed IV antibiotics   Physical deconditioning -PT recommending SNF placement.  TOC consulted.   Depression -Continue escitalopram, trazodone   Anemia of chronic disease -From chronic illnesses.   -H&H stable    Chronic urinary retention with chronic Foley catheter -Continue Foley catheter.  Outpatient follow-up with urology.   Glaucoma - currently not on therapy.  Outpatient follow-up with ophthalmology   Hyperlipidemia Aortic  atherosclerosis -Currently not on medical therapy.  Outpatient follow-up with PCP   Essential hypertension -Not on any antihypertensives as an outpatient.  Outpatient follow-up.   COPD -Currently stable.  Monitor   Pressure Injury Documentation: Pressure Injury 02/11/23 Sacrum Mid Stage 1 -  Intact skin with non-blanchable redness of a localized area usually over a bony prominence. (Active)  02/11/23   Location: Sacrum  Location Orientation: Mid  Staging: Stage 1 -  Intact skin with non-blanchable redness of a localized area usually over a bony prominence.  Wound Description (Comments):   Present on Admission: No  Dressing Type Foam - Lift dressing to assess site every shift 02/15/23 2000   Estimated body mass index is 21.15 kg/m as calculated from the following:   Height as of this encounter: 5\' 8"  (1.727 m).   Weight as of this encounter: 63.1 kg.  Code Status: Full code DVT Prophylaxis:  enoxaparin (LOVENOX) injection 40 mg Start: 02/08/23 2200   Level of Care: Level of care: Med-Surg Family Communication: Updated patient's daughter at the bedside on 10/23 Disposition Plan:      Remains inpatient appropriate: PT recommended SNF, currently awaiting bed.  Medically stable.   Procedures:    Consultants:   Palliative medicine  Antimicrobials:  Anti-infectives (From admission, onward)    Start     Dose/Rate Route Frequency Ordered Stop   02/09/23 1000  cefTRIAXone (ROCEPHIN) 1 g in sodium chloride 0.9 % 100 mL IVPB  Status:  Discontinued        1 g 200 mL/hr over 30 Minutes Intravenous Every 24 hours 02/08/23 1537 02/15/23 1452   02/08/23 1145  cefTRIAXone (ROCEPHIN) 1 g in sodium chloride 0.9 % 100 mL IVPB        1 g 200 mL/hr over 30 Minutes Intravenous  Once 02/08/23 1133 02/08/23 1330          Medications  Chlorhexidine Gluconate Cloth  6 each Topical Daily   enoxaparin (LOVENOX) injection  40 mg Subcutaneous Q24H   escitalopram  20 mg Oral Daily    risperiDONE  1 mg Oral QHS   senna-docusate  1 tablet Oral Once   traZODone  100 mg Oral Daily      Subjective:   Thompson Gato was seen and examined today.  No acute complaints, oriented to self, follows some commands.  No ongoing nausea vomiting, chest pain or shortness of breath, fevers.  Objective:   Vitals:   02/15/23 1135 02/15/23 1929 02/16/23 0503 02/16/23 1142  BP: 112/66 115/66 (!) 123/58 135/67  Pulse: 65 70 (!) 53 68  Resp: 16 17 16 20   Temp: 98.4 F (36.9 C) 98.2 F (36.8 C) 97.9 F (36.6 C) 98.4 F (36.9 C)  TempSrc: Oral Oral    SpO2: 100% 100% 100% 100%  Weight:      Height:        Intake/Output Summary (Last 24 hours) at 02/16/2023 1343 Last data filed at 02/16/2023 0830 Gross per 24 hour  Intake 105 ml  Output 500 ml  Net -395 ml     Wt Readings from Last 3 Encounters:  02/08/23 63.1 kg  12/04/22 63.1 kg  10/24/22 57.2 kg    Physical Exam General: Alert and oriented x self, NAD Cardiovascular: S1 S2 clear, RRR.  Respiratory: CTAB, no wheezing Gastrointestinal: Soft, nontender, nondistended, NBS Ext: no pedal edema bilaterally Psych: dementia    Data Reviewed:  I have personally reviewed following labs    CBC Lab Results  Component Value Date   WBC 4.3 02/14/2023   RBC 3.30 (L) 02/14/2023   HGB 10.0 (L) 02/14/2023   HCT 32.4 (L) 02/14/2023   MCV 98.2 02/14/2023   MCH 30.3 02/14/2023   PLT 241 02/14/2023   MCHC 30.9 02/14/2023   RDW 12.2 02/14/2023   LYMPHSABS 1.3 02/14/2023   MONOABS 0.5 02/14/2023   EOSABS 0.2 02/14/2023   BASOSABS 0.0 02/14/2023     Last metabolic panel Lab Results  Component Value Date   NA 138 02/14/2023   K 3.9 02/14/2023   CL 97 (L) 02/14/2023   CO2 33 (H) 02/14/2023   BUN 14 02/14/2023   CREATININE 0.78 02/14/2023   GLUCOSE 88 02/14/2023   GFRNONAA >60 02/14/2023   GFRAA >60 07/09/2019   CALCIUM 8.8 (L) 02/14/2023   PHOS 3.3 02/10/2023   PROT 5.5 (L) 02/09/2023   ALBUMIN 3.2 (L)  02/10/2023   BILITOT 0.8 02/09/2023   ALKPHOS 53 02/09/2023   AST 14 (L) 02/09/2023   ALT 12 02/09/2023   ANIONGAP 8 02/14/2023    CBG (last 3)  No results for input(s): "GLUCAP" in the last 72 hours.    Coagulation Profile: No results for input(s): "INR", "PROTIME" in the last 168 hours.    Radiology  Studies: I have personally reviewed the imaging studies  No results found.     Thad Ranger M.D. [facility] 02/16/2023, 1:43 PM  Available via Epic secure chat 7am-7pm After 7 pm, please refer to night coverage provider listed on amion.

## 2023-02-16 NOTE — Progress Notes (Signed)
Physical Therapy Treatment Patient Details Name: Alexander [name] MRN: [id number] DOB: 05/16/1932 Today's Date: 02/16/2023   History of Present Illness Pt is an 89 y/o M admitted on 02/08/23 after presenting with c/o fever, AMS, malodorous urine. Work up revealed possible UTI & volume depletion. PMH: Alzheimer's disease, anxiety, asthma, BPH, nonobstructive CAD, Hypertensive heart disease, COPD, depression, GERD, HLD, chronic urinary retention with chronic foley catheter    PT Comments  Daughter present and assisting.  She reports at baseline, pt goes to [facility] daily and she is with him otherwise.  States that he is ambulatory in home with rollator vs no AD and supervision.  Does report 3 falls this year.  Today, pt able to increase mobility - he ambulated 50' in hallway but with poor balance requiring mod A of 2 for safety.  Pt requiring frequent multimodal cues and assist to initiate - responds well to cues for daughter.  Continue to recommend Patient will benefit from continued inpatient follow up therapy, <3 hours/day at d/c - daughter in agreement.     If plan is discharge home, recommend the following: A lot of help with walking and/or transfers;A lot of help with bathing/dressing/bathroom;Assist for transportation;Assistance with cooking/housework;Direct supervision/assist for financial management;Help with stairs or ramp for entrance;Direct supervision/assist for medications management;Supervision due to cognitive status   Can travel by private vehicle     No  Equipment Recommendations  None recommended by PT    Recommendations for Other Services       Precautions / Restrictions Precautions Precautions: Fall     Mobility  Bed Mobility Overal bed mobility: Needs Assistance Bed Mobility: Supine to Sit, Sit to Supine     Supine to sit: Mod assist, Used rails Sit to supine: Mod assist   General bed mobility comments: Increased time, multimodal cues for sequencing,  pt assisting some with legs and trunk    Transfers Overall transfer level: Needs assistance Equipment used: Rollator (4 wheels) Transfers: Sit to/from Stand Sit to Stand: Mod assist           General transfer comment: Required 3 attempts with multimodal cues from daughter and assist to initiate.  Pt tending to extend legs requiring feet blocked.  Required tactile cues and assist at buttock to tuck and stand up straight.    Ambulation/Gait Ambulation/Gait assistance: Mod assist, +2 safety/equipment Gait Distance (Feet): 50 Feet Assistive device: Rollator (4 wheels) Gait Pattern/deviations: Step-to pattern, Decreased stride length, Shuffle Gait velocity: decreased     General Gait Details: Pt tending to ambulate with arms forward and hip buttock/back.  He did have brief periods where he stood straight with assist.   He required mod A for balance as well as assist to guide rollator and coax him to step forward.  Daughter present and assisting.   Stairs             Wheelchair Mobility     Tilt Bed    Modified Rankin (Stroke Patients Only)       Balance Overall balance assessment: Needs assistance Sitting-balance support: Feet supported, Bilateral upper extremity supported Sitting balance-Leahy Scale: Poor Sitting balance - Comments: close supervision static sitting Postural control: Posterior lean Standing balance support: Bilateral upper extremity supported, During functional activity, Reliant on assistive device for balance Standing balance-Leahy Scale: Poor Standing balance comment: mod A for balance; worked on standing straight and tucking buttock prior to [employer]  Cognition Arousal: Alert Behavior During Therapy: WFL for tasks assessed/performed Overall Cognitive Status: History of cognitive impairments - at baseline                                 General Comments: Pt with hx of dementia.  Daughter  present.  Pt oriented to self, follows limited commands.  Responds well to daughter's multimodal cues with increased time        Exercises      General Comments General comments (skin integrity, edema, etc.): VSS      Pertinent Vitals/Pain Pain Assessment Pain Assessment: No/denies pain    Home Living                          Prior Function            PT Goals (current goals can now be found in the care plan section) Progress towards PT goals: Progressing toward goals    Frequency    Min 1X/week      PT Plan      Co-evaluation              AM-PAC PT "6 Clicks" Mobility   Outcome Measure  Help needed turning from your back to your side while in a flat bed without using bedrails?: A Little Help needed moving from lying on your back to sitting on the side of a flat bed without using bedrails?: A Lot Help needed moving to and from a bed to a chair (including a wheelchair)?: A Lot Help needed standing up from a chair using your arms (e.g., wheelchair or bedside chair)?: A Lot Help needed to walk in hospital room?: Total Help needed climbing 3-5 steps with a railing? : Total 6 Click Score: 11    End of Session Equipment Utilized During Treatment: Gait belt Activity Tolerance: Patient tolerated treatment well Patient left: in bed;with call bell/phone within reach;with bed alarm set;with family/visitor present Nurse Communication: Mobility status PT Visit Diagnosis: Muscle weakness (generalized) (M62.81);Difficulty in walking, not elsewhere classified (R26.2);Other abnormalities of gait and mobility (R26.89)     Time: 8413-2440 PT Time Calculation (min) (ACUTE ONLY): 28 min  Charges:    $Gait Training: 8-22 mins $Therapeutic Activity: 8-22 mins PT General Charges $$ ACUTE PT VISIT: 1 Visit                     Anise Salvo, PT Acute Rehab [facility] [phone]    Rayetta Humphrey 02/16/2023, 4:55 PM

## 2023-02-16 NOTE — Progress Notes (Signed)
Alexander [name] [id number] [facility] Hospitalized Hospice Patient Visit   [name] is a current hospice patient, with hospice diagnosis of abnormal weight loss and unspecified dementia, who was admitted to [facility] on 02/08/23 with fever related to likely UTI. [facility] was notified of admission bedside nurse on 02/09/23. Per Dr. Patric Dykes, hospice physician, this is a related hospital admission.   Alexander [name] is sitting up in bed, he has mitts in place and is attempting to get them off. He does respond when spoken to but does not carry on a conversation. He his in no apparent distress. Talked with daughter Alexander [name] who report she is reaching to [facility] to discuss possible rehab placement. We discussed that with completion of IVAB he will have no medical need to remain in the hospital.  He remains inpatient appropriate at this time with completion of IVAB yesterday to monitor for recurrence of symptoms. Vital Signs: 97.9/91/16   123/58     100% on room air I&O: 100/200 Abnormal labs: None New  Diagnostics: None New IV/PRN Meds: Rocephin 1 gram q24H completed yesterday Problem List: Acute UTI (urinary tract infection) -Currently on Rocephin.  Blood cultures negative so far.  Urine cultures grew multiple species. -Complete course of antibiotics for 7 days     Acute metabolic encephalopathy/delirium in the setting of dementia - Overall prognosis is guarded to poor. -  Enrolled with hospice as an outpatient but remains full code.  Hospice following. - Fall precautions.  Delirium precautions.  - Continue risperidone -Overnight was confused and agitated, required mittens, Haldol 2 mg   Physical deconditioning -PT recommending SNF placement.  TOC consulted.   Depression -Continue escitalopram, trazodone     Discharge Planning: Ongoing, d/c once IVAB complete, possibly SNF rehab Family Contact: Talked with daughter Alexander [name] by phone IDT: Updated Goals of Care: Full  code Alexander [name], BSN RN Hospice hospital liaison [phone]

## 2023-02-17 DIAGNOSIS — J410 Simple chronic bronchitis: Secondary | ICD-10-CM | POA: Diagnosis not present

## 2023-02-17 DIAGNOSIS — I10 Essential (primary) hypertension: Secondary | ICD-10-CM | POA: Diagnosis not present

## 2023-02-17 DIAGNOSIS — K2100 Gastro-esophageal reflux disease with esophagitis, without bleeding: Secondary | ICD-10-CM | POA: Diagnosis not present

## 2023-02-17 DIAGNOSIS — N390 Urinary tract infection, site not specified: Secondary | ICD-10-CM | POA: Diagnosis not present

## 2023-02-17 MED ORDER — DOCUSATE SODIUM 100 MG PO CAPS
100.0000 mg | ORAL_CAPSULE | Freq: Two times a day (BID) | ORAL | Status: DC
  Administered 2023-02-17 – 2023-02-22 (×6): 100 mg via ORAL
  Filled 2023-02-17 (×9): qty 1

## 2023-02-17 MED ORDER — BISACODYL 10 MG RE SUPP
10.0000 mg | Freq: Once | RECTAL | Status: AC
  Administered 2023-02-17: 10 mg via RECTAL
  Filled 2023-02-17: qty 1

## 2023-02-17 MED ORDER — POLYETHYLENE GLYCOL 3350 17 G PO PACK
17.0000 g | PACK | Freq: Every day | ORAL | Status: DC
  Administered 2023-02-17 – 2023-02-22 (×4): 17 g via ORAL
  Filled 2023-02-17 (×4): qty 1

## 2023-02-17 MED ORDER — ENSURE ENLIVE PO LIQD
237.0000 mL | Freq: Two times a day (BID) | ORAL | Status: DC
Start: 2023-02-18 — End: 2023-02-23
  Administered 2023-02-18 – 2023-02-23 (×9): 237 mL via ORAL

## 2023-02-17 NOTE — Plan of Care (Signed)

## 2023-02-17 NOTE — Progress Notes (Signed)
[facility]                                                                              Alexander [name], is a 89 y.o. male, DOB - [birth year], WUJ:[id number] Admit date - 02/08/2023    Outpatient Primary MD for the patient is Alexander Fillers, MD  LOS - 9  days  Chief Complaint  Patient presents with   Fever       Brief summary    89 year old male with medical history significant for Alzheimer's disease anxiety, asthma, BPH, nonobstructive CAD, hypertensive heart disease, COPD, depression, GERD, hyperlipidemia, chronic urinary retention needing chronic Foley catheter and hypertension was brought by his daughter due to fever, altered mental status and malodorous urine.  Workup revealed possible urinary tract infection and volume depletion.  He was started on IV fluids and antibiotics.  Hospice team following.  Patient remains full code.  PT recommending SNF placement.   Assessment & Plan    Principal Problem:   SIRS with Acute UTI (urinary tract infection) UTI possibly related to chronic Foley catheter -Currently on Rocephin.  Blood cultures negative so far.  Urine cultures grew multiple species. -Complete course of antibiotics for 7 days    Acute metabolic encephalopathy/delirium in the setting of dementia - Overall prognosis is guarded to poor.  Enrolled with hospice as an outpatient but remains full code.  Hospice following. - Fall precautions.  Delirium precautions.  - Continue risperidone -Has completed IV antibiotics   Physical deconditioning -PT recommending SNF placement.  TOC consulted.   Depression -Continue escitalopram, trazodone   Anemia of chronic disease -From chronic illnesses.   -H&H stable    Chronic urinary retention with chronic Foley catheter -Continue Foley catheter.  Outpatient follow-up with urology.   Glaucoma - currently not on therapy.  Outpatient follow-up with ophthalmology   Hyperlipidemia Aortic  atherosclerosis -Currently not on medical therapy.  Outpatient follow-up with PCP   Essential hypertension -Not on any antihypertensives as an outpatient.  Outpatient follow-up.   COPD -Currently stable.  Monitor  Constipation -Per RN, no BM documented since 10/20, placed on MiraLAX daily, Colace twice daily, Dulcolax supp x 1 -Will attempt enema tomorrow if no BM    Pressure Injury Documentation: Pressure Injury 02/11/23 Sacrum Mid Stage 1 -  Intact skin with non-blanchable redness of a localized area usually over a bony prominence. (Active)  02/11/23   Location: Sacrum  Location Orientation: Mid  Staging: Stage 1 -  Intact skin with non-blanchable redness of a localized area usually over a bony prominence.  Wound Description (Comments):   Present on Admission: No  Dressing Type Foam - Lift dressing to assess site every shift 02/16/23 2100   Estimated body mass index is 21.15 kg/m as calculated from the following:   Height as of this encounter: 5\' 8"  (1.727 m).   Weight as of this encounter: 63.1 kg.  Code Status: Full code DVT Prophylaxis:  enoxaparin (LOVENOX) injection 40 mg Start: 02/08/23 2200   Level of Care: Level of care: Med-Surg Family Communication: Updated patient's daughter at the bedside on 10/23 Disposition Plan:  Remains inpatient appropriate: PT recommended SNF, currently awaiting bed.  Medically stable.   Procedures:    Consultants:   Palliative medicine  Antimicrobials:   Anti-infectives (From admission, onward)    Start     Dose/Rate Route Frequency Ordered Stop   02/09/23 1000  cefTRIAXone (ROCEPHIN) 1 g in sodium chloride 0.9 % 100 mL IVPB  Status:  Discontinued        1 g 200 mL/hr over 30 Minutes Intravenous Every 24 hours 02/08/23 1537 02/15/23 1452   02/08/23 1145  cefTRIAXone (ROCEPHIN) 1 g in sodium chloride 0.9 % 100 mL IVPB        1 g 200 mL/hr over 30 Minutes Intravenous  Once 02/08/23 1133 02/08/23 1330           Medications  bisacodyl  10 mg Rectal Once   Chlorhexidine Gluconate Cloth  6 each Topical Daily   docusate sodium  100 mg Oral BID   enoxaparin (LOVENOX) injection  40 mg Subcutaneous Q24H   escitalopram  20 mg Oral Daily   polyethylene glycol  17 g Oral Daily   risperiDONE  1 mg Oral QHS   senna-docusate  1 tablet Oral Once   traZODone  100 mg Oral Daily      Subjective:   [name] was seen and examined today.  Much more alert and awake this morning, following commands. + Constipation.  No acute nausea vomiting, fevers.  Awaiting SNF  Objective:   Vitals:   02/16/23 1142 02/16/23 1928 02/17/23 0453 02/17/23 1330  BP: 135/67 130/70 120/64 (!) 110/50  Pulse: 68 72 (!) 56 79  Resp: 20 17 17 20   Temp: 98.4 F (36.9 C) 98.6 F (37 C) 98 F (36.7 C) 98.6 F (37 C)  TempSrc:   Oral Oral  SpO2: 100% 100% 100% 91%  Weight:      Height:        Intake/Output Summary (Last 24 hours) at 02/17/2023 1410 Last data filed at 02/17/2023 0900 Gross per 24 hour  Intake 300 ml  Output 675 ml  Net -375 ml     Wt Readings from Last 3 Encounters:  02/08/23 63.1 kg  12/04/22 63.1 kg  10/24/22 57.2 kg   Physical Exam General: Alert and oriented x 2, NAD Cardiovascular: S1 S2 clear, RRR.  Respiratory: CTAB, no wheezing, rales or rhonchi Gastrointestinal: Soft, nontender, nondistended, NBS Ext: no pedal edema bilaterally Neuro: no new deficits Psych: dementia   Data Reviewed:  I have personally reviewed following labs    CBC Lab Results  Component Value Date   WBC 4.3 02/14/2023   RBC 3.30 (L) 02/14/2023   HGB 10.0 (L) 02/14/2023   HCT 32.4 (L) 02/14/2023   MCV 98.2 02/14/2023   MCH 30.3 02/14/2023   PLT 241 02/14/2023   MCHC 30.9 02/14/2023   RDW 12.2 02/14/2023   LYMPHSABS 1.3 02/14/2023   MONOABS 0.5 02/14/2023   EOSABS 0.2 02/14/2023   BASOSABS 0.0 02/14/2023     Last metabolic panel Lab Results  Component Value Date   NA 138 02/14/2023    K 3.9 02/14/2023   CL 97 (L) 02/14/2023   CO2 33 (H) 02/14/2023   BUN 14 02/14/2023   CREATININE 0.78 02/14/2023   GLUCOSE 88 02/14/2023   GFRNONAA >60 02/14/2023   GFRAA >60 07/09/2019   CALCIUM 8.8 (L) 02/14/2023   PHOS 3.3 02/10/2023   PROT 5.5 (L) 02/09/2023   ALBUMIN 3.2 (L) 02/10/2023   BILITOT 0.8 02/09/2023  ALKPHOS 53 02/09/2023   AST 14 (L) 02/09/2023   ALT 12 02/09/2023   ANIONGAP 8 02/14/2023    CBG (last 3)  No results for input(s): "GLUCAP" in the last 72 hours.    Coagulation Profile: No results for input(s): "INR", "PROTIME" in the last 168 hours.    Radiology Studies: I have personally reviewed the imaging studies  No results found.     Thad Ranger M.D. [facility] 02/17/2023, 2:10 PM  Available via Epic secure chat 7am-7pm After 7 pm, please refer to night coverage provider listed on amion.

## 2023-02-17 NOTE — Progress Notes (Signed)
[facility] Liaison Note-Hospitalized hospice patient  [name] is a current hospice patient, with hospice diagnosis of abnormal weight loss and unspecified dementia, who was admitted to [facility] on 02/08/23 with fever related to likely UTI. [facility] was notified of admission bedside nurse on 02/09/23. Per Dr. Patric Dykes, hospice physician, this is a related hospital admission.    Patient is lying in bed asleep. Plan remains for SNF once bed becomes available. Per TOC potentially Monday.   He remains inpatient appropriate due to needing close monitoring for reoccurrence of symptoms.   Vital Signs: 98.6, 79, 20, 110/50, 91% on 2 L oxygen I&O: 3,253 intake/4,825 ml output  Abnormal labs: None New  Diagnostics: None New IV/PRN Meds: none  Problem List: SIRS with Acute UTI (urinary tract infection) UTI possibly related to chronic Foley catheter -Currently on Rocephin.  Blood cultures negative so far.  Urine cultures grew multiple species. -Complete course of antibiotics for 7 days   Acute metabolic encephalopathy/delirium in the setting of dementia - Overall prognosis is guarded to poor.  Enrolled with hospice as an outpatient but remains full code.  Hospice following. - Fall precautions.  Delirium precautions.  - Continue risperidone -Has completed IV antibiotics   Physical deconditioning -PT recommending SNF placement.  TOC consulted.   Depression -Continue escitalopram, trazodone   Anemia of chronic disease -From chronic illnesses.   -H&H stable    Chronic urinary retention with chronic Foley catheter -Continue Foley catheter.  Outpatient follow-up with urology.   Glaucoma - currently not on therapy.  Outpatient follow-up with ophthalmology   Hyperlipidemia Aortic atherosclerosis -Currently not on medical therapy.  Outpatient follow-up with PCP   Essential hypertension -Not on any antihypertensives as an outpatient.   Outpatient follow-up.   COPD -Currently stable.  Monitor   Constipation -Per RN, no BM documented since 10/20, placed on MiraLAX daily, Colace twice daily, Dulcolax supp x 1 -Will attempt enema tomorrow if no BM   Discharge Planning: Ongoing, possibly SNF rehab Family Contact: Talked with daughter [name] by phone IDT: Updated Goals of Care: Full code  Please use [facility] for transport at dc.   Please call with any questions or concerns. Thank you  [name], LCSW [facility] hospital liaison [phone]

## 2023-02-17 NOTE — Progress Notes (Signed)
Rai sent epic chat. No BM documented since 02/11/23. Can we place colace and miralax orders PRN to start.

## 2023-02-18 ENCOUNTER — Inpatient Hospital Stay (HOSPITAL_COMMUNITY): Payer: Medicare Other

## 2023-02-18 DIAGNOSIS — J410 Simple chronic bronchitis: Secondary | ICD-10-CM | POA: Diagnosis not present

## 2023-02-18 DIAGNOSIS — N390 Urinary tract infection, site not specified: Secondary | ICD-10-CM | POA: Diagnosis not present

## 2023-02-18 DIAGNOSIS — K2100 Gastro-esophageal reflux disease with esophagitis, without bleeding: Secondary | ICD-10-CM | POA: Diagnosis not present

## 2023-02-18 LAB — CBC
HCT: 34.7 % — ABNORMAL LOW (ref 39.0–52.0)
Hemoglobin: 10.7 g/dL — ABNORMAL LOW (ref 13.0–17.0)
MCH: 30.1 pg (ref 26.0–34.0)
MCHC: 30.8 g/dL (ref 30.0–36.0)
MCV: 97.7 fL (ref 80.0–100.0)
Platelets: 264 10*3/uL (ref 150–400)
RBC: 3.55 MIL/uL — ABNORMAL LOW (ref 4.22–5.81)
RDW: 12.1 % (ref 11.5–15.5)
WBC: 4 10*3/uL (ref 4.0–10.5)
nRBC: 0 % (ref 0.0–0.2)

## 2023-02-18 LAB — GLUCOSE, CAPILLARY: Glucose-Capillary: 82 mg/dL (ref 70–99)

## 2023-02-18 LAB — BASIC METABOLIC PANEL
Anion gap: 10 (ref 5–15)
BUN: 15 mg/dL (ref 8–23)
CO2: 33 mmol/L — ABNORMAL HIGH (ref 22–32)
Calcium: 9.1 mg/dL (ref 8.9–10.3)
Chloride: 95 mmol/L — ABNORMAL LOW (ref 98–111)
Creatinine, Ser: 0.63 mg/dL (ref 0.61–1.24)
GFR, Estimated: >60 mL/min (ref >60)
Glucose, Bld: 84 mg/dL (ref 70–99)
Potassium: 4.3 mmol/L (ref 3.5–5.1)
Sodium: 138 mmol/L (ref 135–145)

## 2023-02-18 NOTE — Progress Notes (Signed)
[facility]                                                                              Alexander [name], is a 89 y.o. male, DOB - 16-Jun-1932, ZOX:[id number] Admit date - 02/08/2023    Outpatient Primary MD for the patient is [name], MD  LOS - 10  days  Chief Complaint  Patient presents with   Fever       Brief summary    89 year old male with medical history significant for Alzheimer's disease anxiety, asthma, BPH, nonobstructive CAD, hypertensive heart disease, COPD, depression, GERD, hyperlipidemia, chronic urinary retention needing chronic Foley catheter and hypertension was brought by his daughter due to fever, altered mental status and malodorous urine.  Workup revealed possible urinary tract infection and volume depletion.  He was started on IV fluids and antibiotics.  Hospice team following.  Patient remains full code.  PT recommending SNF placement.   10/27: Awaiting SNF  Assessment & Plan    Principal Problem:   SIRS with Acute UTI (urinary tract infection) UTI possibly related to chronic Foley catheter -Currently on Rocephin.  Blood cultures negative so far.  Urine cultures grew multiple species. -Complete course of antibiotics for 7 days    Acute metabolic encephalopathy/delirium in the setting of dementia - Overall prognosis is guarded to poor.  Enrolled with hospice as an outpatient but remains full code.  Hospice following. - Fall precautions.  Delirium precautions.  - Continue risperidone -Has completed IV antibiotics   Physical deconditioning -PT recommending SNF placement.  TOC consulted.   Depression -Continue escitalopram, trazodone   Anemia of chronic disease -From chronic illnesses.   -H&H stable    Chronic urinary retention with chronic Foley catheter -Continue Foley catheter.  Outpatient follow-up with urology.   Glaucoma - currently not on therapy.  Outpatient follow-up with ophthalmology    Hyperlipidemia Aortic atherosclerosis -Currently not on medical therapy.  Outpatient follow-up with PCP   Essential hypertension -Not on any antihypertensives as an outpatient.  Outpatient follow-up.   COPD -Currently stable.  Monitor  Constipation -Patient had a BM this morning.  Continue bowel regimen    Pressure Injury Documentation: Pressure Injury 02/11/23 Sacrum Mid Stage 1 -  Intact skin with non-blanchable redness of a localized area usually over a bony prominence. (Active)  02/11/23   Location: Sacrum  Location Orientation: Mid  Staging: Stage 1 -  Intact skin with non-blanchable redness of a localized area usually over a bony prominence.  Wound Description (Comments):   Present on Admission: No  Dressing Type Foam - Lift dressing to assess site every shift 02/17/23 2000   Estimated body mass index is 21.15 kg/m as calculated from the following:   Height as of this encounter: 5\' 8"  (1.727 m).   Weight as of this encounter: 63.1 kg.  Code Status: Full code DVT Prophylaxis:  enoxaparin (LOVENOX) injection 40 mg Start: 02/08/23 2200   Level of Care: Level of care: Med-Surg Family Communication: Updated patient's daughter at the bedside on 10/23 Disposition Plan:      Remains inpatient appropriate: PT recommended SNF, currently awaiting bed.  Medically stable.   Procedures:    Consultants:   Palliative medicine  Antimicrobials:   Anti-infectives (From admission, onward)    Start     Dose/Rate Route Frequency Ordered Stop   02/09/23 1000  cefTRIAXone (ROCEPHIN) 1 g in sodium chloride 0.9 % 100 mL IVPB  Status:  Discontinued        1 g 200 mL/hr over 30 Minutes Intravenous Every 24 hours 02/08/23 1537 02/15/23 1452   02/08/23 1145  cefTRIAXone (ROCEPHIN) 1 g in sodium chloride 0.9 % 100 mL IVPB        1 g 200 mL/hr over 30 Minutes Intravenous  Once 02/08/23 1133 02/08/23 1330          Medications  Chlorhexidine Gluconate Cloth  6 each Topical Daily    docusate sodium  100 mg Oral BID   enoxaparin (LOVENOX) injection  40 mg Subcutaneous Q24H   escitalopram  20 mg Oral Daily   feeding supplement  237 mL Oral BID BM   polyethylene glycol  17 g Oral Daily   risperiDONE  1 mg Oral QHS   senna-docusate  1 tablet Oral Once   traZODone  100 mg Oral Daily      Subjective:   Alexander [name] was seen and examined today.  No complaints.  Had a BM today.  Constipation resolved.  Awaiting SNF.   Objective:   Vitals:   02/17/23 0453 02/17/23 1330 02/17/23 1944 02/18/23 0646  BP: 120/64 (!) 110/50 122/66 129/74  Pulse: (!) 56 79 66 66  Resp: 17 20 20 20   Temp: 98 F (36.7 C) 98.6 F (37 C) 98.1 F (36.7 C) 98.1 F (36.7 C)  TempSrc: Oral Oral Oral Oral  SpO2: 100% 91% 100% 97%  Weight:      Height:        Intake/Output Summary (Last 24 hours) at 02/18/2023 1246 Last data filed at 02/18/2023 0656 Gross per 24 hour  Intake 60 ml  Output 1625 ml  Net -1565 ml     Wt Readings from Last 3 Encounters:  02/08/23 63.1 kg  12/04/22 63.1 kg  10/24/22 57.2 kg    Physical Exam General: Sleepy but easily arousable, NAD Cardiovascular: S1 S2 clear, RRR.  Respiratory: CTAB, no wheezing, rales or rhonchi Gastrointestinal: Soft, nontender, nondistended, NBS Ext: no pedal edema bilaterally Neuro: no new deficits Psych: dementia   Data Reviewed:  I have personally reviewed following labs    CBC Lab Results  Component Value Date   WBC 4.0 02/18/2023   RBC 3.55 (L) 02/18/2023   HGB 10.7 (L) 02/18/2023   HCT 34.7 (L) 02/18/2023   MCV 97.7 02/18/2023   MCH 30.1 02/18/2023   PLT 264 02/18/2023   MCHC 30.8 02/18/2023   RDW 12.1 02/18/2023   LYMPHSABS 1.3 02/14/2023   MONOABS 0.5 02/14/2023   EOSABS 0.2 02/14/2023   BASOSABS 0.0 02/14/2023     Last metabolic panel Lab Results  Component Value Date   NA 138 02/18/2023   K 4.3 02/18/2023   CL 95 (L) 02/18/2023   CO2 33 (H) 02/18/2023   BUN 15 02/18/2023   CREATININE  0.63 02/18/2023   GLUCOSE 84 02/18/2023   GFRNONAA >60 02/18/2023   GFRAA >60 07/09/2019   CALCIUM 9.1 02/18/2023   PHOS 3.3 02/10/2023   PROT 5.5 (L) 02/09/2023   ALBUMIN 3.2 (L) 02/10/2023   BILITOT 0.8 02/09/2023   ALKPHOS 53 02/09/2023   AST 14 (L) 02/09/2023   ALT 12 02/09/2023  ANIONGAP 10 02/18/2023    CBG (last 3)  No results for input(s): "GLUCAP" in the last 72 hours.    Coagulation Profile: No results for input(s): "INR", "PROTIME" in the last 168 hours.    Radiology Studies: I have personally reviewed the imaging studies  No results found.     Thad Ranger M.D. [facility] 02/18/2023, 12:46 PM  Available via Epic secure chat 7am-7pm After 7 pm, please refer to night coverage provider listed on amion.

## 2023-02-18 NOTE — Significant Event (Signed)
Rapid Response Event Note   Reason for Call :  Less arousal than normal, increased secretions  Initial Focused Assessment:  On assessment patient very drowsy, nurses can audibly hear secretions in the back of patients throat. Primary RN unable to get suction to back of throat due to dentures. This RN sternal rubbed patient 3 times or patient to arouse, slow to open eyes. This RN was able to use suction to get into the back of the throat to suction patient, dentures had to be pushed to the side to achieve this. Lung bases were clear but upper airway sounds congested and rhonchus. Oral secretions improved after 3rd suction. NP ordered CXR and NT suctions, if oral suctioning is not obtainable. Primary nurse will hold PO medications tonight.   Interventions:  CXR NT suction ordered if needed  Plan of Care:  Awaiting CXR results Hold PO medications for tonight  Event Summary:   MD Notified: Chinita Greenland, NP Call Time: 2148 Arrival Time: 2150 End Time: 2215  Odette Horns, RN

## 2023-02-18 NOTE — Progress Notes (Signed)
[facility] hospitalized hospice patient visit    [name] is a current hospice patient, with hospice diagnosis of abnormal weight loss and unspecified dementia, who was admitted to [facility] on 02/08/23 with fever related to likely UTI. [facility] was notified of admission bedside nurse on 02/09/23. Per Dr. Patric Dykes, hospice physician, this is a related hospital admission.    Rounded on patient at bedside.  He is sitting up and CNA is attempting to feed him.  He is very lethargic according to staff and continues to have poor PO intake.  Per bedside RN, plan is for patient to go to [facility] rehab tomorrow if medically stable.  Per RN, patient's daughter Lamar Laundry is aware of and agreeable to plan.  Liaison team will continue to follow for discharge planning.  Vital Signs: 98.1/66/20   129/74     96% on 2L I&O: 120/1625 Abnormal labs: RBC 3.55, Hgb 10.7, HCT 34.7, Chloride 95, CO2 33 Diagnostics: None New IV/PRN Meds: none  Problem List (per hospitalist): Principal Problem:   SIRS with Acute UTI (urinary tract infection) UTI possibly related to chronic Foley catheter -Currently on Rocephin.  Blood cultures negative so far.  Urine cultures grew multiple species. -Complete course of antibiotics for 7 days   Acute metabolic encephalopathy/delirium in the setting of dementia - Overall prognosis is guarded to poor.  Enrolled with hospice as an outpatient but remains full code.  Hospice following. - Fall precautions.  Delirium precautions.  - Continue risperidone -Has completed IV antibiotics   Physical deconditioning -PT recommending SNF placement.  TOC consulted.   Depression -Continue escitalopram, trazodone   Anemia of chronic disease -From chronic illnesses.   -H&H stable    Chronic urinary retention with chronic Foley catheter -Continue Foley catheter.  Outpatient follow-up with urology.   Glaucoma - currently not on therapy.   Outpatient follow-up with ophthalmology   Hyperlipidemia Aortic atherosclerosis -Currently not on medical therapy.  Outpatient follow-up with PCP   Essential hypertension -Not on any antihypertensives as an outpatient.  Outpatient follow-up.   COPD -Currently stable.  Monitor   Constipation -Patient had a BM this morning.  Continue bowel regimen   Discharge Planning: Plan for discharge to [facility] rehab when bed available, possibly tomorrow. Family Contact: Unable to reach daughter by phone. IDT: Updated Goals of Care: Full code  Doreatha Martin, RN, [facility] Liaison [phone]

## 2023-02-18 NOTE — Progress Notes (Addendum)
Epic chat sent to Rai. Daughter would like patient to receive covid booster and flu shot at discharge at discharge. Please place orders.

## 2023-02-19 DIAGNOSIS — N3000 Acute cystitis without hematuria: Principal | ICD-10-CM

## 2023-02-19 DIAGNOSIS — K2100 Gastro-esophageal reflux disease with esophagitis, without bleeding: Secondary | ICD-10-CM | POA: Diagnosis not present

## 2023-02-19 DIAGNOSIS — N390 Urinary tract infection, site not specified: Secondary | ICD-10-CM | POA: Diagnosis not present

## 2023-02-19 DIAGNOSIS — J410 Simple chronic bronchitis: Secondary | ICD-10-CM | POA: Diagnosis not present

## 2023-02-19 DIAGNOSIS — I10 Essential (primary) hypertension: Secondary | ICD-10-CM | POA: Diagnosis not present

## 2023-02-19 DIAGNOSIS — L8990 Pressure ulcer of unspecified site, unspecified stage: Secondary | ICD-10-CM | POA: Insufficient documentation

## 2023-02-19 LAB — CBC
HCT: 33.5 % — ABNORMAL LOW (ref 39.0–52.0)
Hemoglobin: 10.3 g/dL — ABNORMAL LOW (ref 13.0–17.0)
MCH: 30.2 pg (ref 26.0–34.0)
MCHC: 30.7 g/dL (ref 30.0–36.0)
MCV: 98.2 fL (ref 80.0–100.0)
Platelets: 249 10*3/uL (ref 150–400)
RBC: 3.41 MIL/uL — ABNORMAL LOW (ref 4.22–5.81)
RDW: 12.5 % (ref 11.5–15.5)
WBC: 4.8 10*3/uL (ref 4.0–10.5)
nRBC: 0 % (ref 0.0–0.2)

## 2023-02-19 LAB — BASIC METABOLIC PANEL
Anion gap: 8 (ref 5–15)
BUN: 17 mg/dL (ref 8–23)
CO2: 33 mmol/L — ABNORMAL HIGH (ref 22–32)
Calcium: 8.9 mg/dL (ref 8.9–10.3)
Chloride: 96 mmol/L — ABNORMAL LOW (ref 98–111)
Creatinine, Ser: 0.75 mg/dL (ref 0.61–1.24)
GFR, Estimated: >60 mL/min (ref >60)
Glucose, Bld: 84 mg/dL (ref 70–99)
Potassium: 4.1 mmol/L (ref 3.5–5.1)
Sodium: 137 mmol/L (ref 135–145)

## 2023-02-19 LAB — GLUCOSE, CAPILLARY
Glucose-Capillary: 77 mg/dL (ref 70–99)
Glucose-Capillary: 66 mg/dL — ABNORMAL LOW (ref 70–99)
Glucose-Capillary: 134 mg/dL — ABNORMAL HIGH (ref 70–99)

## 2023-02-19 MED ORDER — NALOXONE HCL 0.4 MG/ML IJ SOLN
0.4000 mg | Freq: Once | INTRAMUSCULAR | Status: AC
  Administered 2023-02-19: 0.4 mg via INTRAVENOUS
  Filled 2023-02-19: qty 1

## 2023-02-19 MED ORDER — DEXTROSE 50 % IV SOLN
INTRAVENOUS | Status: AC
  Filled 2023-02-19: qty 50

## 2023-02-19 MED ORDER — DEXTROSE 50 % IV SOLN
1.0000 | Freq: Once | INTRAVENOUS | Status: AC
  Administered 2023-02-19: 50 mL via INTRAVENOUS

## 2023-02-19 MED ORDER — SODIUM CHLORIDE 0.9 % IV SOLN: INTRAVENOUS | Status: AC

## 2023-02-19 MED ORDER — GLYCOPYRROLATE 0.2 MG/ML IJ SOLN: 0.1000 mg | INTRAMUSCULAR | Status: DC | PRN

## 2023-02-19 NOTE — Progress Notes (Signed)
Spoke with day shift RN Jamse Arn regarding update to patient's code status. Almira Coaster stated to this nurse "Patient's POA (daughter Rosanne Gutting) approached at approximately 5pm asking if decision had been made regarding code status. POA said she would like code status changed from full code to DNR. MD Rai notified, code status changed according to POA's wishes. DNR band placed on patient, two nurse verification with Jamse Arn, RN and Christena Deem, RN."
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
## 2023-02-19 NOTE — Progress Notes (Signed)
SLP Cancellation Note  Patient Details Name: Alexander [name] MRN: [id number] DOB: Feb 05, 1933   Cancelled treatment:       Reason Eval/Treat Not Completed: Patient's level of consciousness Patient would not rouse to loud voice, tactile cues or sternal rub. RN suspects this might be do to sedating medications. SLP will attempt later this date.  Angela Nevin, MA, CCC-SLP Speech Therapy

## 2023-02-19 NOTE — Progress Notes (Signed)
The conversation about DNR status has been an ongoing topic since January, but apparently it was against the wishes of the patient when he was of sound mind. That's why the brother is against DNR, and the sister does not want any animosity among the family. Hospice nurse is conversing with patient now.

## 2023-02-19 NOTE — Progress Notes (Signed)
Patient HR is high 40s-low 50s this evening, monitor reading 51, apical recount of 46 bpm. BP also soft 102/56 (69). Patient remains very drowsy & lethargic, still  responds to pain. Patient oriented to self per baseline. UOP 425 mL shift output, patient has not had any intake for shift. Paged Hospitalist to notify, NP is aware. IV fluids ordered, see MAR.

## 2023-02-19 NOTE — Progress Notes (Signed)
Patient very lethargic on evening assessment, difficult to arouse even with deep sternal rub. Patient scored Yellow MEWS due to decreased LOC. Other vitals WNL (HR runs on softer side). POCT 82.   Patient having some phlegm in the back of his throat that he is unable to clear. Attempted oral suctioning but patient is confused and uncooperative. Encouraged patient to cough, patient follows commands intermittently. Patient is oriented to self per baseline, very drowsy. Called Rapid Response for further assessment. Hospitalist notified, Rapid Response nurse en route to bedside.
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
## 2023-02-19 NOTE — TOC Progression Note (Signed)
Transition of Care [facility]) - Progression Note    Patient Details  Name: Alexander [name] MRN: [id number] Date of Birth: 08/13/1932  Transition of Care [facility]) CM/SW Contact  Harriett Sine, RN Phone Number: 02/19/2023, 11:35 AM  Clinical Narrative:     Spoke with pt at bedside  and daughter on phone about SNF placement. Family choice of Snf was [facility].   Insurance auth called in [phone]. SNF placement with transportation requested. Rep will call back with decision.   Expected Discharge Plan: Home w Home Health Services Barriers to Discharge: No Barriers Identified  Expected Discharge Plan and Services       Living arrangements for the past 2 months: Single Family Home                 DME Arranged:  (waiting for pt eval)                     Social Determinants of Health (SDOH) Interventions SDOH Screenings   Food Insecurity: No Food Insecurity (02/09/2023)  Housing: Patient Declined (02/09/2023)  Transportation Needs: No Transportation Needs (02/09/2023)  Utilities: Not At Risk (02/09/2023)  Depression (PHQ2-9): Low Risk  (12/16/2020)  Financial Resource Strain: Low Risk  (09/07/2017)  Physical Activity: Sufficiently Active (09/07/2017)  Social Connections: Socially Integrated (09/07/2017)  Stress: No Stress Concern Present (09/07/2017)  Tobacco Use: Medium Risk (02/08/2023)    Readmission Risk Interventions    11/30/2022    9:58 AM  Readmission Risk Prevention Plan  Transportation Screening Complete  PCP or Specialist Appt within 5-7 Days Complete  Home Care Screening Complete  Medication Review (RN CM) Complete

## 2023-02-19 NOTE — Plan of Care (Addendum)
  Interdisciplinary Goals of Care Family Meeting   Date carried out: 02/19/2023  Location of the meeting: Bedside  Member's involved: Physician (myself), patient's daughter at the bedside Alexander [name]), had 2 brothers Alexander [name] on phone   Durable Power of Attorney or Environmental health practitioner: Patient's daughter is the healthcare power of attorney  Discussion: We discussed goals of care for patient, [name]  We discussed diagnoses, prognosis, GOC, EOL wishes disposition and options.  Overnight events explained to the patient's daughter at bedside and 2 sons on the phone.  Increasing lethargy, aspiration overnight, with suctioning for secretions, resulting in hypoglycemia from n.p.o. status explained to the patient's family.  Recommended DNR DNI in case of cardiac or respiratory arrest and DNR/DNI does not mean limiting care in any way.  Of note, patient is enrolled in hospice at this time.   Discussed regarding advanced directives in detail.  Concepts specific to code status, feeding and hydration, IV antibiotics were discussed.  The difference between an aggressive medical intervention path full code and DNR/DNI was discussed.  Discussed limitations of medical interventions to prolong quality of life in some situations.   Patient's daughter, Alexander [name] is agreeable for DNR/DNI however wanted to have unanimous decision with her brothers on board.  Patient's son, Alexander [name], not agreeable with the DNR/DNI status.  Hospice liaison to also address this.  Code status: Full Code   Disposition: Continue current acute care  Time spent for the meeting: 30 mins   Iysis Germain, MD 02/19/2023, 2:27 PM

## 2023-02-19 NOTE — Progress Notes (Addendum)
[facility] hospitalized hospice patient visit    [name] is a current hospice patient, with hospice diagnosis of abnormal weight loss and unspecified dementia, who was admitted to [facility] on 02/08/23 with fever related to likely UTI. [facility] was notified of admission bedside nurse on 02/09/23. Per Dr. Patric [name], hospice physician, this is a related hospital admission.   Checked in with Bedside RN, who stated that patient had a Rapid Respone Event called at 2219 last night due to increased secretions/less arousal than normal noted - sternal rub several times was initiated to get patient to open eyes which is not the norm for this patient. Patient was suctioned/CXR ordered/PO HS medications held. Patient remains very lethargic this am - RN gave Narcan right before my arrival to the unit. Per RN, Patient plans for transfer today unsure due to patient status.  Visited patient in room with son Alexander [name] at side, Patient sitting up with eyes closed in NAD and did not respond to verbal stimuli during my visit. Supported family and encouraged to reach out with any hospice related questions or concerns, family aware that hospice will be making daily visits during the patient's hospital stay. Patient remains inpatient appropriate due to the need for skilled assessment after a rapid response was initiated due to decreased lethargy/decreased responsiveness and increased secretions.   Vital Signs: 97.3, 54, 18, 123/63, 100 % sats on 2.5 L O2 via [place] I&O: 0/625 (-625) Abnormal labs: RBC 3.41, Hbg 10.3, HCT 33.5, Chl 96, CO2 33 Diagnostics: CXR 02/18/23: No Active disease/ Rt costophrenic angle collimated off view/Aortic Atherosclerosis  IV/PRN Meds: NS IVF at 68m/hr, Per RN Narcan adm this am    Problem List (per hospitalist): No New MD Notes for today at time of visit. Last IM MD Note from 02/18/23:  Principal Problem:   SIRS with Acute UTI (urinary tract  infection) UTI possibly related to chronic Foley catheter -Currently on Rocephin.  Blood cultures negative so far.  Urine cultures grew multiple species. -Complete course of antibiotics for 7 days    Acute metabolic encephalopathy/delirium in the setting of dementia - Overall prognosis is guarded to poor.  Enrolled with hospice as an outpatient but remains full code.  Hospice following. - Fall precautions.  Delirium precautions.  - Continue risperidone -Has completed IV antibiotics   Physical deconditioning -PT recommending SNF placement.  TOC consulted.   Depression -Continue escitalopram, trazodone   Anemia of chronic disease -From chronic illnesses.   -H&H stable    Chronic urinary retention with chronic Foley catheter -Continue Foley catheter.  Outpatient follow-up with urology.   Glaucoma - currently not on therapy.  Outpatient follow-up with ophthalmology   Hyperlipidemia Aortic atherosclerosis -Currently not on medical therapy.  Outpatient follow-up with PCP   Essential hypertension -Not on any antihypertensives as an outpatient.  Outpatient follow-up.   COPD -Currently stable.  Monitor   Constipation -Patient had a BM this morning.  Continue bowel regimen    Discharge Planning:  Ongoing Assessment : Plan for discharge to [facility] rehab when bed available/patient medically stable. Please use [facility] for all [facility] patient's transportation needs. Family Contact: Unable to reach daughter by phone to discuss GOC/wishes. Left voicemail with call back cell phone number. Supported patient's son at bedside. IDT: Updated ACC team on patient status Goals of Care: Ongoing Assessment - Patient currently a Full code   Roda Shutters, RN [facility] Liaison  [phone]

## 2023-02-20 DIAGNOSIS — F0390 Unspecified dementia without behavioral disturbance: Secondary | ICD-10-CM | POA: Diagnosis not present

## 2023-02-20 DIAGNOSIS — J410 Simple chronic bronchitis: Secondary | ICD-10-CM | POA: Diagnosis not present

## 2023-02-20 DIAGNOSIS — N3000 Acute cystitis without hematuria: Secondary | ICD-10-CM | POA: Diagnosis not present

## 2023-02-20 DIAGNOSIS — I10 Essential (primary) hypertension: Secondary | ICD-10-CM | POA: Diagnosis not present

## 2023-02-20 LAB — BASIC METABOLIC PANEL
Anion gap: 7 (ref 5–15)
BUN: 21 mg/dL (ref 8–23)
CO2: 34 mmol/L — ABNORMAL HIGH (ref 22–32)
Calcium: 9 mg/dL (ref 8.9–10.3)
Chloride: 99 mmol/L (ref 98–111)
Creatinine, Ser: 0.8 mg/dL (ref 0.61–1.24)
GFR, Estimated: >60 mL/min (ref >60)
Glucose, Bld: 99 mg/dL (ref 70–99)
Potassium: 4.1 mmol/L (ref 3.5–5.1)
Sodium: 140 mmol/L (ref 135–145)

## 2023-02-20 LAB — CBC
HCT: 31.5 % — ABNORMAL LOW (ref 39.0–52.0)
Hemoglobin: 9.6 g/dL — ABNORMAL LOW (ref 13.0–17.0)
MCH: 30 pg (ref 26.0–34.0)
MCHC: 30.5 g/dL (ref 30.0–36.0)
MCV: 98.4 fL (ref 80.0–100.0)
Platelets: 263 10*3/uL (ref 150–400)
RBC: 3.2 MIL/uL — ABNORMAL LOW (ref 4.22–5.81)
RDW: 12.1 % (ref 11.5–15.5)
WBC: 3.9 10*3/uL — ABNORMAL LOW (ref 4.0–10.5)
nRBC: 0 % (ref 0.0–0.2)

## 2023-02-20 MED ORDER — SODIUM CHLORIDE 0.9 % IV SOLN: INTRAVENOUS | Status: DC

## 2023-02-20 NOTE — TOC Progression Note (Addendum)
Transition of Care [facility]) - Progression Note    Patient Details  code Name: Alexander [name] MRN: [id number] Date of Birth: 06-07-1932  Transition of Care [facility]) CM/SW Contact  Harriett Sine, RN Phone Number: 02/20/2023, 10:54 AM  Clinical Narrative:     Pt and family changing DNR-Limited code to FULL code. Pt and family revoking hospice statue and that no insurance auth would be needed now.  Called [facility] to update on changes spoke with [place]. She stated she would need to get approval from business office and check bed availability.   Nikki texted NCM that the facility could not take pt at this time, no beds was the explanation. To following.   From family choice list NCM called [facility] and [facility]. The concern is with pt revoking Hospice the insurance statue will change next month and insurance auth is not guaranteed leaving the facility is a bad place.  TOC following  Called Son[facility] with update on bed status at SNF.   Expected Discharge Plan: Home w Home Health Services Barriers to Discharge: No Barriers Identified  Expected Discharge Plan and Services       Living arrangements for the past 2 months: Single Family Home                 DME Arranged:  (waiting for pt eval)                     Social Determinants of Health (SDOH) Interventions SDOH Screenings   Food Insecurity: No Food Insecurity (02/09/2023)  Housing: Patient Declined (02/09/2023)  Transportation Needs: No Transportation Needs (02/09/2023)  Utilities: Not At Risk (02/09/2023)  Depression (PHQ2-9): Low Risk  (12/16/2020)  Financial Resource Strain: Low Risk  (09/07/2017)  Physical Activity: Sufficiently Active (09/07/2017)  Social Connections: Socially Integrated (09/07/2017)  Stress: No Stress Concern Present (09/07/2017)  Tobacco Use: Medium Risk (02/08/2023)    Readmission Risk Interventions    11/30/2022    9:58 AM  Readmission Risk Prevention Plan  Transportation Screening  Complete  PCP or Specialist Appt within 5-7 Days Complete  Home Care Screening Complete  Medication Review (RN CM) Complete

## 2023-02-20 NOTE — Progress Notes (Signed)
On evening assessment, patient's LOC waxing/waning. Patient alternating between moments of alertness versus very difficult to arouse. Patient orientation unchanged, A&O x 1. Unable to follow commands at this time. Vitals otherwise WNL. Hospitalist notified, no new orders received. PO medications held at this time, hospitalist is aware.

## 2023-02-20 NOTE — Progress Notes (Signed)
Speech Language Pathology Treatment: Dysphagia  Patient Details Name: Alexander [name] MRN: [id number] DOB: 07-Feb-1933 Today's Date: 02/20/2023 Time: 0254-2706 SLP Time Calculation (min) (ACUTE ONLY): 10 min  Assessment / Plan / Recommendation Clinical Impression  Patient seen by SLP for skilled treatment focused on dysphagia goals. Patient's RN was in room and administering some medications. RN reported that patient is total assist feeding but he did eat over 50% of breakfast this morning with no observed difficulty. Patient initially refusing medications but eventually agreed following RN prompting. SLP observed patient with PO's of medications (capsule, tablet) taken with thin liquids (water) and then an entire Ensure supplement shake. Swallow initiation appeared timely and no overt s/s aspiration even with successive straw sips. SLP will plan to f/u at least one more time to ensure continued toleration.    HPI HPI: Patient is an 89 y.o. male with PMH: Alzheimer's disease, anxiety, asthma, nonobstructive CAD, HTN, COPD, depression, GERD, hearing loss, HLD. He presented to the hospital on 02/08/23 with AMS and malodorous urine. In ED he was febrile at 101.8 F, and workup revealed possible UTI and volume depletion. Patient is already an established hospice patient with diagnosis of abnormal weight loss and unspecified dementia. Rapid Response was called at 2219 on 02/18/23 due to patient with increased secretions, less arousal than normal; he remained lethargic in AM on 10/28. He was made NPO until SLP swallow evaluation completed.      SLP Plan  Continue with current plan of care      Recommendations for follow up therapy are one component of a multi-disciplinary discharge planning process, led by the attending physician.  Recommendations may be updated based on patient status, additional functional criteria and insurance authorization.    Recommendations  Diet recommendations: Dysphagia  3 (mechanical soft);Thin liquid Liquids provided via: Cup;Straw Medication Administration: Whole meds with liquid Supervision: Trained caregiver to feed patient;Staff to assist with self feeding;Full supervision/cueing for compensatory strategies Compensations: Slow rate;Small sips/bites;Minimize environmental distractions Postural Changes and/or Swallow Maneuvers: Seated upright 90 degrees                  Oral care BID;Staff/trained caregiver to provide oral care   Frequent or constant Supervision/Assistance Dysphagia, unspecified (R13.10)     Continue with current plan of care    Angela Nevin, MA, CCC-SLP Speech Therapy
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
## 2023-02-20 NOTE — Progress Notes (Signed)
[facility]22 [facility] hospital liaison note  Patient revoked hospice benefit today in order to be able to seek skilled rehab.   Please don't hesitate to reach out for any hospice related questions or concerns.  Thea Gist, BSN RN Hospice hospital liaison [phone]

## 2023-02-20 NOTE — Progress Notes (Signed)
[facility]22 [employer]  Hospice hospital liaison note  Liaison received phone call from patient's daughter [name] reporting that after consultation with her family they have decided that her dad's wishes would be to have CPR in the event his heart stopped or he stopped breathing. She said that he would not want to be kept alive on machines but that he would want the initial intervention.   Please don't hesitate to reach out with any hospice related questions or concerns.  Thea Gist, BSN RN Hospice hospital liaison [phone]

## 2023-02-21 DIAGNOSIS — Z515 Encounter for palliative care: Secondary | ICD-10-CM | POA: Diagnosis not present

## 2023-02-21 DIAGNOSIS — Z7189 Other specified counseling: Secondary | ICD-10-CM | POA: Diagnosis not present

## 2023-02-21 DIAGNOSIS — N3000 Acute cystitis without hematuria: Secondary | ICD-10-CM | POA: Diagnosis not present

## 2023-02-21 DIAGNOSIS — R531 Weakness: Secondary | ICD-10-CM | POA: Diagnosis not present

## 2023-02-21 LAB — BASIC METABOLIC PANEL
Anion gap: 9 (ref 5–15)
BUN: 17 mg/dL (ref 8–23)
CO2: 31 mmol/L (ref 22–32)
Calcium: 8.8 mg/dL — ABNORMAL LOW (ref 8.9–10.3)
Chloride: 99 mmol/L (ref 98–111)
Creatinine, Ser: 0.65 mg/dL (ref 0.61–1.24)
GFR, Estimated: >60 mL/min (ref >60)
Glucose, Bld: 90 mg/dL (ref 70–99)
Potassium: 4.3 mmol/L (ref 3.5–5.1)
Sodium: 139 mmol/L (ref 135–145)

## 2023-02-21 LAB — CBC
HCT: 34.4 % — ABNORMAL LOW (ref 39.0–52.0)
Hemoglobin: 10.4 g/dL — ABNORMAL LOW (ref 13.0–17.0)
MCH: 30.1 pg (ref 26.0–34.0)
MCHC: 30.2 g/dL (ref 30.0–36.0)
MCV: 99.7 fL (ref 80.0–100.0)
Platelets: 257 10*3/uL (ref 150–400)
RBC: 3.45 MIL/uL — ABNORMAL LOW (ref 4.22–5.81)
RDW: 12.3 % (ref 11.5–15.5)
WBC: 4 10*3/uL (ref 4.0–10.5)
nRBC: 0 % (ref 0.0–0.2)

## 2023-02-21 MED ORDER — RISPERIDONE 0.25 MG PO TABS
0.2500 mg | ORAL_TABLET | Freq: Two times a day (BID) | ORAL | Status: DC
  Administered 2023-02-21 – 2023-02-22 (×3): 0.25 mg via ORAL
  Filled 2023-02-21 (×3): qty 1

## 2023-02-21 MED ORDER — RISPERIDONE 1 MG PO TABS: 1.0000 mg | ORAL_TABLET | Freq: Every day | ORAL | Status: DC

## 2023-02-21 MED ORDER — TRAZODONE HCL 100 MG PO TABS: 100.0000 mg | ORAL_TABLET | Freq: Every day | ORAL | Status: DC

## 2023-02-21 MED ORDER — LORAZEPAM 0.5 MG PO TABS
0.5000 mg | ORAL_TABLET | ORAL | Status: DC | PRN
  Administered 2023-02-21: 0.5 mg via ORAL
  Filled 2023-02-21: qty 1

## 2023-02-21 NOTE — Progress Notes (Signed)
[facility] Progress Note Patient: Alexander [name] XBJ:[id number] DOB: 05-05-1932 DOA: 02/08/2023  DOS: the patient was seen and examined on 02/21/2023  Brief hospital course: PMH of Alzheimer's dementia, asthma, BPH, CAD, HTN, COPD, GERD, HLD, urinary retention with Foley catheter present to the hospital with complaints of fever, confusion. Patient has been on hospice prior to admission secondary to dementia. Currently being treated for a UTI. Awaiting placement. Hospice has been revoked.  Assessment and Plan: UTI. Secondary to chronic indwelling Foley catheter. Was treated with Rocephin. Blood culture negative.  Urine culture grew multiple species. Antibiotic course completed. Less likely patient still suffering from an active UTI.  SIRS. Met SIRS criteria on admission although does not have any evidence of sepsis right now.  Monitor.  Goals of care conversation. Palliative care has been consulted. Patient has been on hospice prior to admission. At present family has transition to acute care and revoked the hospice as they would like to receive rehabilitation. Will monitor.  Dementia.  With delirium and acute metabolic encephalopathy Dysphagia. Patient has been on Risperdal dose medication have been on hold since admission. Will initiate that at a lower dose. And monitor. SLP has been following.  Has been on dysphagia 3 diet and has been able to feed himself for now.  COPD. No evidence of exacerbation.  HTN prevention blood pressure stable.  Monitor.  Anemia normocytic. No active bleeding so far. Monitor.  Depression. Continue home regimen.  HLD. Continuing observation for now.  Patient is likely not on medication as the patient was on hospice prior to admission.  Subjective: Alert.  Speech is incoherent which likely is at baseline.  No nausea no vomiting.  Try to get out of the bed.  Physical Exam: General: in Mild distress, No Rash Cardiovascular: S1  and S2 Present, No Murmur Respiratory: Good respiratory effort, Bilateral Air entry present. No Crackles, No wheezes Abdomen: Bowel Sound present, No tenderness Extremities: No edema Neuro: Alert and not oriented x3, no new focal deficit  Data Reviewed: I have Reviewed nursing notes, Vitals, and Lab results. Since last encounter, pertinent lab results CBC and BMP   .   Disposition: Status is: Inpatient Remains inpatient appropriate because: Awaiting placement.  enoxaparin (LOVENOX) injection 40 mg Start: 02/08/23 2200   Family Communication: No one at bedside Level of care: Med-Surg   Vitals:   02/20/23 1144 02/20/23 2010 02/21/23 0503 02/21/23 1511  BP: 112/67 123/82 138/71 112/68  Pulse: (!) 59 68 (!) 55 71  Resp: 20 16 16 20   Temp: 98.4 F (36.9 C) 98.4 F (36.9 C) 97.9 F (36.6 C) 97.9 F (36.6 C)  TempSrc: Oral   Oral  SpO2: 100% 100% 100% 100%  Weight:      Height:         Author: Lynden Oxford, MD 02/21/2023 7:01 PM  Please look on [email or web address] to find out who is on call.

## 2023-02-21 NOTE — Progress Notes (Signed)
Physical Therapy Treatment Patient Details Name: Alexander [name] MRN: [id number] DOB: Sep 16, 1932 Today's Date: 02/21/2023   History of Present Illness Pt is an 89 y/o M admitted on 02/08/23 after presenting with c/o fever, AMS, malodorous urine. Work up revealed possible UTI & volume depletion. PMH: Alzheimer's disease, anxiety, asthma, BPH, nonobstructive CAD, Hypertensive heart disease, COPD, depression, GERD, HLD, chronic urinary retention with chronic foley catheter    PT Comments  The patient is awakes sitting up in bed. Required frequent cues to allow Pulse ox to be taken, 925 on 2 L.  Patient assisted with max support to stand at Rw x 3 , 3rd time x 3 minutes, slight improvement in posterior bias but still quite  posterior lean.  Alexander [name] PT for mobility.    If plan is discharge home, recommend the following: A lot of help with walking and/or transfers;A lot of help with bathing/dressing/bathroom;Assist for transportation;Assistance with cooking/housework;Direct supervision/assist for financial management;Help with stairs or ramp for entrance;Direct supervision/assist for medications management;Supervision due to cognitive status   Can travel by private vehicle     No  Equipment Recommendations  None recommended by PT    Recommendations for Other Services       Precautions / Restrictions Restrictions Weight Bearing Restrictions: No     Mobility  Bed Mobility   Bed Mobility: Supine to Sit, Sit to Supine     Supine to sit: Min assist Sit to supine: Max assist   General bed mobility comments: patient semi seated on bed edge, legs over, bed rail up, RN in room    Transfers Overall transfer level: Needs assistance Equipment used: Rolling walker (2 wheels) Transfers: Sit to/from Stand Sit to Stand: Max assist           General transfer comment: Max assist to rise from bed at Rw, significant posterior pushing, improved with subsequent stnads but remained posterio  bias    Ambulation/Gait                   Stairs             Wheelchair Mobility     Tilt Bed    Modified Rankin (Stroke Patients Only)       Balance Overall balance assessment: Needs assistance Sitting-balance support: Feet supported, Bilateral upper extremity supported Sitting balance-Leahy Scale: Fair Sitting balance - Comments: close supervision static sitting Postural control: Posterior lean Standing balance support: Bilateral upper extremity supported, During functional activity, Reliant on assistive device for balance Standing balance-Leahy Scale: Poor                              Cognition Arousal: Alert Behavior During Therapy: Flat affect Overall Cognitive Status: History of cognitive impairments - at baseline                                 General Comments: oriented to self, follows simple commands with increased time and inconsistent, frequent cues        Exercises      General Comments        Pertinent Vitals/Pain Pain Assessment Faces Pain Scale: No hurt    Home Living                          Prior Function  PT Goals (current goals can now be found in the care plan section) Progress towards PT goals: Progressing toward goals    Frequency    Min 1X/week      PT Plan      Co-evaluation              AM-PAC PT "6 Clicks" Mobility   Outcome Measure  Help needed turning from your back to your side while in a flat bed without using bedrails?: A Little Help needed moving from lying on your back to sitting on the side of a flat bed without using bedrails?: A Lot Help needed moving to and from a bed to a chair (including a wheelchair)?: A Lot Help needed standing up from a chair using your arms (e.g., wheelchair or bedside chair)?: A Lot Help needed to walk in hospital room?: Total Help needed climbing 3-5 steps with a railing? : Total 6 Click Score: 11    End of  Session Equipment Utilized During Treatment: Gait belt   Patient left: in bed;with call bell/phone within reach;with bed alarm set Nurse Communication: Mobility status PT Visit Diagnosis: Muscle weakness (generalized) (M62.81);Difficulty in walking, not elsewhere classified (R26.2);Other abnormalities of gait and mobility (R26.89)     Time: 8295-6213 PT Time Calculation (min) (ACUTE ONLY): 35 min  Charges:    $Therapeutic Activity: 23-37 mins PT General Charges $$ ACUTE PT VISIT: 1 Visit                     Alexander [name] PT Acute Rehabilitation Services Office [phone] Weekend [phone]    Alexander [name] 02/21/2023, 3:15 PM

## 2023-02-21 NOTE — Consult Note (Signed)
Consultation Note Date: 02/21/2023   Patient Name: Alexander [name]  DOB: 09-15-1932  MRN: [id number]  Age / Sex: 89 y.o., male  PCP: [name], MD Referring Physician: Rolly Salter, MD  Reason for Consultation: Establishing goals of care  HPI/Patient Profile: 89 y.o. male admitted on 02/08/2023    Clinical Assessment and Goals of Care: 89 year old gentleman with dementia anxiety asthma BPH nonobstructive coronary artery disease hypertension COPD depression GERD dyslipidemia chronic urinary retention needing chronic Foley. Patient was enrolled in hospice services, was brought in by daughter due to fever altered mental status, admitted to hospital medicine service for volume depletion possible urinary tract infection Multiple goals of care conversations have been done by hospice liaison's as well as by primary team.  Ultimately, plan switch to full code, consideration for SNF rehab placement  Palliative consult continues Chart reviewed, patient seen and examined, call placed and was able to reach daughter Alexander [name] who is the patient's healthcare power of attorney agent. Palliative medicine is specialized medical care for people living with serious illness. It focuses on providing relief from the symptoms and stress of a serious illness. The goal is to improve quality of life for both the patient and the family. Goals of care: Broad aims of medical therapy in relation to the patient's values and preferences. Our aim is to provide medical care aimed at enabling patients to achieve the goals that matter most to them, given the circumstances of their particular medical situation and their constraints.    [facility] at [phone].  SUMMARY OF RECOMMENDATIONS   Goals of care discussions undertaken.  Brief life review performed.  Patient has never been in a skilled nursing facility  environment.  He has only gone for respite care.  His daughter Alexander [name] is his healthcare power of attorney agent and primary caregiver.  She states that she was helping him walk with assistance at home.  She lost her mother a few months ago.  She is hopeful that the patient will have some improvement in his functional status and that she will be able to bring him home after a brief rehab trial at local skilled nursing facility.  We discussed about differences between Medicare and Medicare hospice benefit.  We discussed about differences between full code full scope care and a more of a comfort-focused approach with hospice services.  All of her questions and concerns addressed to the best of my ability.  Appreciate TOC and their continued follow-up.  Plan: Full code full scope for now SNF rehab with palliative for now  Code Status/Advance Care Planning: Full code   Symptom Management:     Palliative Prophylaxis:  Delirium Protocol  Additional Recommendations (Limitations, Scope, Preferences): Full Scope Treatment  Psycho-social/Spiritual:  Desire for further Chaplaincy support:yes Additional Recommendations: Caregiving  Support/Resources  Prognosis:  Unable to determine  Discharge Planning: Skilled Nursing Facility for rehab with Palliative care service follow-up      Primary Diagnoses: Present on Admission:  Fever  GERD (gastroesophageal reflux disease)  Glaucoma  HLD (hyperlipidemia)  Moderate dementia with behavioral disturbance  Essential hypertension  Anemia  Chronic obstructive pulmonary disease (HCC)  Acute UTI (urinary tract infection)  Dementia without behavioral disturbance (HCC)   I have reviewed the medical record, interviewed the patient and family, and examined the patient. The following aspects are pertinent.  Past Medical History:  Diagnosis Date   Alzheimers disease (HCC)    mild   Anxiety    Asthma    BPH (benign prostatic hyperplasia)    CAD  (coronary artery disease)    Constipation    COPD (chronic obstructive pulmonary disease) (HCC)    chronic dyspnea   Dementia (HCC)    wife answered he has Dementia   Depression    GERD (gastroesophageal reflux disease)    Hearing loss    Hemorrhoids    HYPERLIPIDEMIA    Hypertension    Hypertensive heart disease    LVH (left ventricular hypertrophy)    a. 2014 Echo: EF 65-70%, no rwma, Gr1 DD, mild MR.   Non-obstructive CAD (coronary artery disease)    a. 02/2009 Cath: essentially nl cors; b. 07/2014 MV: mild diaph atten, no ischemia-->low risk.   Social History   Socioeconomic History   Marital status: Married    Spouse name: Not on file   Number of children: 8   Years of education: 8   Highest education level: Not on file  Occupational History   Occupation: RETIRED    Employer: RETIRED    Comment: MACHINE OPERATOR  Tobacco Use   Smoking status: Former    Current packs/day: 0.00    Average packs/day: 0.3 packs/day for 48.0 years (12.0 ttl pk-yrs)    Types: Cigarettes    Start date: 04/24/1952    Quit date: 04/24/2000    Years since quitting: 22.8   Smokeless tobacco: Never  Vaping Use   Vaping status: Never Used  Substance and Sexual Activity   Alcohol use: Not Currently   Drug use: No   Sexual activity: Yes  Other Topics Concern   Not on file  Social History Narrative   EXERCISES INTERMITTENTLY AT THE GYM      5 GRANDCHILDREN      2 GREAT-CHILDREN            WEARS GLASSES      Lives in one story home with wife.      Right handed   Social Determinants of Health   Financial Resource Strain: Low Risk  (09/07/2017)   Overall Financial Resource Strain (CARDIA)    Difficulty of Paying Living Expenses: Not hard at all  Food Insecurity: No Food Insecurity (02/09/2023)   Hunger Vital Sign    Worried About Running Out of Food in the Last Year: Never true    Ran Out of Food in the Last Year: Never true  Transportation Needs: No Transportation Needs  (02/09/2023)   PRAPARE - Administrator, Civil Service (Medical): No    Lack of Transportation (Non-Medical): No  Physical Activity: Sufficiently Active (09/07/2017)   Exercise Vital Sign    Days of Exercise per Week: 3 days    Minutes of Exercise per Session: 50 min  Stress: No Stress Concern Present (09/07/2017)   [employer] of Occupational Health - Occupational Stress Questionnaire    Feeling of Stress : Not at all  Social Connections: Socially Integrated (09/07/2017)   Social Connection and Isolation Panel [NHANES]    Frequency of Communication with Friends and Family: More  than three times a week    Frequency of Social Gatherings with Friends and Family: More than three times a week    Attends Religious Services: More than 4 times per year    Active Member of [employer] or Organizations: Yes    Attends Engineer, structural: More than 4 times per year    Marital Status: Married   Family History  Problem Relation Age of Onset   Pancreatic cancer Mother 32   Hypertension Mother    Aneurysm Father 46       brain   Stroke Father    Hypertension Father    Hyperlipidemia Father    Prostate cancer Brother        x 2 bro   Scheduled Meds:  Chlorhexidine Gluconate Cloth  6 each Topical Daily   docusate sodium  100 mg Oral BID   enoxaparin (LOVENOX) injection  40 mg Subcutaneous Q24H   escitalopram  20 mg Oral Daily   feeding supplement  237 mL Oral BID BM   polyethylene glycol  17 g Oral Daily   risperiDONE  0.25 mg Oral BID   Continuous Infusions: PRN Meds:.acetaminophen **OR** acetaminophen, albuterol, LORazepam, ondansetron **OR** ondansetron (ZOFRAN) IV Medications Prior to Admission:  Prior to Admission medications   Medication Sig Start Date End Date Taking? Authorizing Provider  albuterol (PROVENTIL) (2.5 MG/3ML) 0.083% nebulizer solution TAKE 3 MLS BY NEBULIZATION EVERY 6 HOURS AS NEEDED FOR WHEEZING OR SHORTNESS OF BREATH. 02/04/21  Yes Nyoka Cowden, MD  [facility] ACETAMINOPHEN 325 MG tablet Take 650 mg by mouth every 4 (four) hours as needed for moderate pain. 07/04/22  Yes [provider]  escitalopram (LEXAPRO) 20 MG tablet Take 20 mg by mouth daily.   Yes [provider]  hyoscyamine (LEVSIN) 0.125 MG tablet Take 0.125 mg by mouth 3 (three) times daily as needed for bladder spasms or cramping. 11/25/22  Yes [provider]  LORazepam (ATIVAN) 0.5 MG tablet Take 0.5 mg by mouth every 4 (four) hours as needed for anxiety. 10/17/22  Yes [provider]  risperiDONE (RISPERDAL) 1 MG tablet Take 1 mg by mouth at bedtime.   Yes [provider]  traZODone (DESYREL) 100 MG tablet Take 100 mg by mouth daily. 10/04/22  Yes [provider]   No Active Allergies Review of Systems  weakness Physical Exam Awake alert Resting in bed Has baseline dementia Regular work of breathing No edema Abdomen not distended  Vital Signs: BP 112/68   Pulse 71   Temp 97.9 F (36.6 C) (Oral)   Resp 20   Ht 5\' 8"  (1.727 m)   Wt 63.1 kg   SpO2 100%   BMI 21.15 kg/m  Pain Scale: PAINAD POSS *See Group Information*: 1-Acceptable,Awake and alert Pain Score: 0-No pain   SpO2: SpO2: 100 % O2 Device:SpO2: 100 % O2 Flow Rate: .O2 Flow Rate (L/min): 2 L/min  IO: Intake/output summary:  Intake/Output Summary (Last 24 hours) at 02/21/2023 1536 Last data filed at 02/21/2023 1100 Gross per 24 hour  Intake 1072.86 ml  Output 650 ml  Net 422.86 ml    LBM: Last BM Date : 02/18/23 (smear) Baseline Weight: Weight: 63.1 kg Most recent weight: Weight: 63.1 kg     Palliative Assessment/Data:   Palliative performance scale 50%.  Time In: 1430 Time Out: 1530 Time Total: 60 Greater than 50%  of this time was spent counseling and coordinating care related to the above assessment and plan.  Signed  by: Rosalin Hawking, MD   Please contact Palliative Medicine Team phone at [phone] for questions and  concerns.  For individual provider: See Loretha Stapler

## 2023-02-21 NOTE — Plan of Care (Signed)

## 2023-02-21 NOTE — Plan of Care (Signed)
  Problem: Fluid Volume: Goal: Hemodynamic stability will improve Outcome: Progressing   Problem: Clinical Measurements: Goal: Signs and symptoms of infection will decrease Outcome: Progressing   Problem: Respiratory: Goal: Ability to maintain adequate ventilation will improve Outcome: Progressing   Problem: Health Behavior/Discharge Planning: Goal: Ability to manage health-related needs will improve Outcome: Progressing   Problem: Clinical Measurements: Goal: Ability to maintain clinical measurements within normal limits will improve Outcome: Progressing Goal: Will remain free from infection Outcome: Progressing   Problem: Nutrition: Goal: Adequate nutrition will be maintained Outcome: Progressing   Problem: Coping: Goal: Level of anxiety will decrease Outcome: Progressing   Problem: Pain Managment: Goal: General experience of comfort will improve Outcome: Progressing   Problem: Safety: Goal: Ability to remain free from injury will improve Outcome: Progressing   Problem: Skin Integrity: Goal: Risk for impaired skin integrity will decrease Outcome: Progressing

## 2023-02-21 NOTE — TOC Progression Note (Addendum)
Transition of Care [facility]) - Progression Note    Patient Details  Name: Alexander [name] MRN: [id number] Date of Birth: 05-22-1932  Transition of [facility]) CM/SW Contact  Harriett Sine, RN Phone Number: 02/21/2023, 2:14 PM  Clinical Narrative:     Sherron Monday with Thayer Ohm about SNF placement statue and the changes to expect on February 23, 2023 with Medicare and [employer]. [name] stated she would call [employer] to cancel coverage. NCM encouraged Sonja to speak with Medicare.   Sonja called NCM with Annabelle Harman from [employer] on the line. Annabelle Harman ask NCM to verify that [employer] coverage would take effect on 02/23/23 and that [facility] had revoked hospice. Hospice being revoked was confirmed by [facility].  It was stressed to [facility] that this action needed to happen ASAP.  Called Sonja for decision on insurance coverage, left message. Called 4pm spoke with [facility], she stated she was not able to speak with Medicare and did not contact [employer] about the pre-approval from [employer]. She stated this issue was taking too long, she was going out of town this Friday and this NCM was too rude. This NCM apologized and ended the conversation.   Expected Discharge Plan: Home w Home Health Services Barriers to Discharge: No Barriers Identified  Expected Discharge Plan and Services       Living arrangements for the past 2 months: Single Family Home                 DME Arranged:  (waiting for pt eval)                     Social Determinants of Health (SDOH) Interventions SDOH Screenings   Food Insecurity: No Food Insecurity (02/09/2023)  Housing: Patient Declined (02/09/2023)  Transportation Needs: No Transportation Needs (02/09/2023)  Utilities: Not At Risk (02/09/2023)  Depression (PHQ2-9): Low Risk  (12/16/2020)  Financial Resource Strain: Low Risk  (09/07/2017)  Physical Activity: Sufficiently Active (09/07/2017)  Social Connections: Socially Integrated (09/07/2017)  Stress: No  Stress Concern Present (09/07/2017)  Tobacco Use: Medium Risk (02/08/2023)    Readmission Risk Interventions    11/30/2022    9:58 AM  Readmission Risk Prevention Plan  Transportation Screening Complete  PCP or Specialist Appt within 5-7 Days Complete  Home Care Screening Complete  Medication Review (RN CM) Complete

## 2023-02-22 ENCOUNTER — Inpatient Hospital Stay (HOSPITAL_COMMUNITY): Payer: Medicare Other

## 2023-02-22 DIAGNOSIS — N3000 Acute cystitis without hematuria: Secondary | ICD-10-CM | POA: Diagnosis not present

## 2023-02-22 MED ORDER — ESCITALOPRAM OXALATE 20 MG PO TABS: 20.0000 mg | ORAL_TABLET | Freq: Every day | ORAL | Status: DC

## 2023-02-22 MED ORDER — RISPERIDONE 0.25 MG PO TABS
0.5000 mg | ORAL_TABLET | Freq: Every day | ORAL | Status: DC
  Administered 2023-02-22: 0.5 mg via ORAL
  Filled 2023-02-22: qty 2

## 2023-02-22 MED ORDER — SENNOSIDES-DOCUSATE SODIUM 8.6-50 MG PO TABS
2.0000 | ORAL_TABLET | Freq: Two times a day (BID) | ORAL | Status: DC
  Administered 2023-02-22 – 2023-02-23 (×2): 2 via ORAL
  Filled 2023-02-22 (×2): qty 2

## 2023-02-22 MED ORDER — BISACODYL 10 MG RE SUPP
10.0000 mg | Freq: Once | RECTAL | Status: AC
  Administered 2023-02-22: 10 mg via RECTAL
  Filled 2023-02-22: qty 1

## 2023-02-22 NOTE — Progress Notes (Signed)
[facility] Progress Note Patient: Alexander [name] QMV:[id number] DOB: 26-Mar-1933 DOA: 02/08/2023  DOS: the patient was seen and examined on 02/22/2023    Brief hospital course: PMH of Alzheimer's dementia, asthma, BPH, CAD, HTN, COPD, GERD, HLD, urinary retention with Foley catheter present to the hospital with complaints of fever, confusion. Patient has been on hospice prior to admission secondary to dementia. Currently being treated for a UTI. Awaiting placement. Hospice has been revoked.   Assessment and Plan: UTI. Secondary to chronic indwelling Foley catheter. Was treated with Rocephin. Blood culture negative.  Urine culture grew multiple species. Antibiotic course completed. Less likely patient still suffering from an active UTI.   SIRS. Met SIRS criteria on admission although does not have any evidence of sepsis right now.  Monitor.   Goals of care conversation. Palliative care has been consulted. Patient has been on hospice prior to admission. At present family has transition to acute care and revoked the hospice as they would like to receive rehabilitation. Will monitor.  Constipation. Initiate bowel regimen.  Monitor.   Dementia.  With delirium and acute metabolic encephalopathy Dysphagia. Home medication include Risperdal, Lexapro, Ativan.  Patient appears to be very sensitive to Ativan. At present we will continue Lexapro and switch Risperdal to 0.5 mg nightly. Continue trazodone 100 mg nightly as well.  SLP has been following.  Has been on dysphagia 3 diet and has been able to feed himself for now.   COPD. No evidence of exacerbation.   HTN blood pressure stable.  Monitor.   Anemia normocytic. No active bleeding so far. Monitor.   Depression. Continue home regimen.   HLD. Continuing observation for now.  Patient is likely not on medication as the patient was on hospice prior to admission.  Subjective: Somewhat confused last night.  No  nausea no vomiting no fever no chills.  No BM today.  Physical Exam: General: in Mild distress, No Rash Cardiovascular: S1 and S2 Present, No Murmur Respiratory: Good respiratory effort, Bilateral Air entry present. No Crackles, No wheezes Abdomen: Bowel Sound present, No tenderness Extremities: No edema Neuro: Alert and oriented to self only.  Data Reviewed: I have Reviewed nursing notes, Vitals, and Lab results. I have ordered imaging x-ray abdomen  .   Disposition: Status is: Inpatient Remains inpatient appropriate because: Awaiting placement.  enoxaparin (LOVENOX) injection 40 mg Start: 02/08/23 2200   Family Communication: Discussed with daughter on the phone Level of care: Med-Surg   Vitals:   02/21/23 1511 02/21/23 1941 02/22/23 0440 02/22/23 1140  BP: 112/68 (!) 143/67 (!) 148/74 134/61  Pulse: 71 78 (!) 57 67  Resp: 20 18 17 20   Temp: 97.9 F (36.6 C) 98.4 F (36.9 C) 97.7 F (36.5 C) 98 F (36.7 C)  TempSrc: Oral Oral Oral Oral  SpO2: 100% 100% 100% 100%  Weight:      Height:         Author: Lynden Oxford, MD 02/22/2023 5:47 PM  Please look on [email or web address] to find out who is on call.

## 2023-02-22 NOTE — Plan of Care (Signed)
Daughter was at bedside at the beginning of the shift.  Pt is A&O to person, able to say his birth date. Confused and restless at times. Able to reorient and redirect while daughter at bedside. Scheduled night time meds given, crushed with apple sauce and pt tolerated well. Aspiration precautions maintained.  Foley intact. Pt has bilateral safety mittens in use.  0000: Pt became restless and noted pt swinging his legs over the bed rails, trying to get OOB and trying to take his mitts off.  Frequently reoriented and redirected pt. PRN po ativan given as ordered. On-call team Larkin Ina, NP notified via secure chat and came at bedside to evaluate. No new orders.  Pt resting comfortably at this time. Bed alarm on. Call bell in reach. Will continue to monitor.    Problem: Fluid Volume: Goal: Hemodynamic stability will improve Outcome: Progressing   Problem: Clinical Measurements: Goal: Diagnostic test results will improve Outcome: Progressing Goal: Signs and symptoms of infection will decrease Outcome: Progressing   Problem: Respiratory: Goal: Ability to maintain adequate ventilation will improve Outcome: Progressing   Problem: Education: Goal: Knowledge of General Education information will improve Description: Including pain rating scale, medication(s)/side effects and non-pharmacologic comfort measures Outcome: Progressing   Problem: Health Behavior/Discharge Planning: Goal: Ability to manage health-related needs will improve Outcome: Progressing   Problem: Clinical Measurements: Goal: Ability to maintain clinical measurements within normal limits will improve Outcome: Progressing Goal: Will remain free from infection Outcome: Progressing Goal: Diagnostic test results will improve Outcome: Progressing Goal: Respiratory complications will improve Outcome: Progressing Goal: Cardiovascular complication will be avoided Outcome: Progressing   Problem: Activity: Goal: Risk for  activity intolerance will decrease Outcome: Progressing   Problem: Nutrition: Goal: Adequate nutrition will be maintained Outcome: Progressing   Problem: Coping: Goal: Level of anxiety will decrease Outcome: Progressing   Problem: Elimination: Goal: Will not experience complications related to bowel motility Outcome: Progressing Goal: Will not experience complications related to urinary retention Outcome: Progressing   Problem: Pain Managment: Goal: General experience of comfort will improve Outcome: Progressing   Problem: Safety: Goal: Ability to remain free from injury will improve Outcome: Progressing   Problem: Skin Integrity: Goal: Risk for impaired skin integrity will decrease Outcome: Progressing

## 2023-02-22 NOTE — TOC Progression Note (Signed)
Transition of Care [facility]) - Progression Note    Patient Details  Name: Alexander [name] MRN: [id number] Date of Birth: [birth year]  Transition of Care [facility]) CM/SW Contact  Armanda Heritage, RN Phone Number: 02/22/2023, 4:00 PM  Clinical Narrative:    CM followed with daughter [name], who reports that she would like to do a respite placement for patient at SNF through Monday as she will be out of town over the weekend and unable to care for patient until she gets back.  [name] is aware this will be a private pay arrangement.   CM contacted all facilities that have extended bed offer in the [place] area per request with the following results: [facility] - [facility] bed available [facility]- [facility] bed available [facility]- able to accommodate respite request, payment will need to be arranged directly with facility [facility]- can accommodate respite request if payment is arranged directly with facility [facility]- no bed available  [name] provided with this information, and reports she will contact either [facility] or [place] to arrange payment and notify CM for transport arrangements.  [name] also request HHPT/OT services to start upon patients return to home on Tuesday, along with the 20-hour custodial benefit available through HTA.  TOC will continue to work on HH/ custodial benefit arrangements.      Expected Discharge Plan: Home w Home Health Services Barriers to Discharge: No Barriers Identified  Expected Discharge Plan and Services       Living arrangements for the past 2 months: Single Family Home                 DME Arranged:  (waiting for pt eval)                     Social Determinants of Health (SDOH) Interventions SDOH Screenings   Food Insecurity: No Food Insecurity (02/09/2023)  Housing: Patient Declined (02/09/2023)  Transportation Needs: No Transportation Needs (02/09/2023)  Utilities: Not At Risk (02/09/2023)  Depression (PHQ2-9): Low Risk  (12/16/2020)   Financial Resource Strain: Low Risk  (09/07/2017)  Physical Activity: Sufficiently Active (09/07/2017)  Social Connections: Socially Integrated (09/07/2017)  Stress: No Stress Concern Present (09/07/2017)  Tobacco Use: Medium Risk (02/08/2023)    Readmission Risk Interventions    11/30/2022    9:58 AM  Readmission Risk Prevention Plan  Transportation Screening Complete  PCP or Specialist Appt within 5-7 Days Complete  Home Care Screening Complete  Medication Review (RN CM) Complete

## 2023-02-22 NOTE — TOC Progression Note (Signed)
Transition of Care [facility]) - Progression Note    Patient Details  Name: Alexander [name] MRN: [id number] Date of Birth: 16-Jun-1932  Transition of Care [facility]) CM/SW Contact  Armanda Heritage, RN Phone Number: 02/22/2023, 11:27 AM  Clinical Narrative:    Cm spoke with [name] daughter/HCPOA, notified that [employer] has issued a denial for SNF.  Discussed that [facility] transport is approved, auth ID [id number]. Discussed next steps which include, expedited appeal (did explain that this is not guaranteed to overturn the initial denial), vs return home with [facility] services and that given [employer] payor source patient has access to up to 20 hours of custodial care post hospitalization.  Sonja additionally asks if re-enrolling with hospice is an option, CM explained that it is, but patient would not be able to receive [facility] concurrently with hospice.  [name] will discuss with family to determine next steps she wishes to pursue.  CM will follow up in the afternoon for determination.  Expected Discharge Plan: Home w Home Health Services Barriers to Discharge: No Barriers Identified  Expected Discharge Plan and Services       Living arrangements for the past 2 months: Single Family Home                 DME Arranged:  (waiting for pt eval)                     Social Determinants of Health (SDOH) Interventions SDOH Screenings   Food Insecurity: No Food Insecurity (02/09/2023)  Housing: Patient Declined (02/09/2023)  Transportation Needs: No Transportation Needs (02/09/2023)  Utilities: Not At Risk (02/09/2023)  Depression (PHQ2-9): Low Risk  (12/16/2020)  Financial Resource Strain: Low Risk  (09/07/2017)  Physical Activity: Sufficiently Active (09/07/2017)  Social Connections: Socially Integrated (09/07/2017)  Stress: No Stress Concern Present (09/07/2017)  Tobacco Use: Medium Risk (02/08/2023)    Readmission Risk Interventions    11/30/2022    9:58 AM  Readmission Risk Prevention Plan  Transportation  Screening Complete  PCP or Specialist Appt within 5-7 Days Complete  Home Care Screening Complete  Medication Review (RN CM) Complete

## 2023-02-22 NOTE — Plan of Care (Signed)
  Problem: Fluid Volume: Goal: Hemodynamic stability will improve Outcome: Not Progressing   Problem: Clinical Measurements: Goal: Diagnostic test results will improve Outcome: Not Progressing Goal: Signs and symptoms of infection will decrease Outcome: Not Progressing   Problem: Respiratory: Goal: Ability to maintain adequate ventilation will improve Outcome: Not Progressing   Problem: Education: Goal: Knowledge of General Education information will improve Description: Including pain rating scale, medication(s)/side effects and non-pharmacologic comfort measures Outcome: Not Progressing   Problem: Health Behavior/Discharge Planning: Goal: Ability to manage health-related needs will improve Outcome: Not Progressing   Problem: Clinical Measurements: Goal: Ability to maintain clinical measurements within normal limits will improve Outcome: Not Progressing Goal: Will remain free from infection Outcome: Not Progressing Goal: Diagnostic test results will improve Outcome: Not Progressing Goal: Respiratory complications will improve Outcome: Not Progressing Goal: Cardiovascular complication will be avoided Outcome: Not Progressing   Problem: Activity: Goal: Risk for activity intolerance will decrease Outcome: Not Progressing   Problem: Nutrition: Goal: Adequate nutrition will be maintained Outcome: Not Progressing   Problem: Coping: Goal: Level of anxiety will decrease Outcome: Not Progressing   Problem: Elimination: Goal: Will not experience complications related to bowel motility Outcome: Not Progressing Goal: Will not experience complications related to urinary retention Outcome: Not Progressing   Problem: Pain Managment: Goal: General experience of comfort will improve Outcome: Not Progressing   Problem: Safety: Goal: Ability to remain free from injury will improve Outcome: Not Progressing   Problem: Skin Integrity: Goal: Risk for impaired skin integrity  will decrease Outcome: Not Progressing   

## 2023-02-23 DIAGNOSIS — N3000 Acute cystitis without hematuria: Secondary | ICD-10-CM | POA: Diagnosis not present

## 2023-02-23 MED ORDER — ESCITALOPRAM OXALATE 20 MG PO TABS: 20.0000 mg | ORAL_TABLET | Freq: Every day | ORAL | 0 refills | Status: AC

## 2023-02-23 MED ORDER — LORAZEPAM 0.5 MG PO TABS: 0.5000 mg | ORAL_TABLET | Freq: Three times a day (TID) | ORAL | 0 refills | Status: DC | PRN

## 2023-02-23 MED ORDER — ENSURE ENLIVE PO LIQD: 237.0000 mL | Freq: Three times a day (TID) | ORAL | 0 refills | Status: DC

## 2023-02-23 MED ORDER — RISPERIDONE 0.5 MG PO TABS: 0.5000 mg | ORAL_TABLET | Freq: Every day | ORAL | 0 refills | Status: DC

## 2023-02-23 MED ORDER — POLYETHYLENE GLYCOL 3350 17 G PO PACK: 17.0000 g | PACK | Freq: Every day | ORAL | 0 refills | Status: AC

## 2023-02-23 MED ORDER — DOCUSATE SODIUM 100 MG PO CAPS: 100.0000 mg | ORAL_CAPSULE | Freq: Two times a day (BID) | ORAL | 0 refills | Status: AC

## 2023-02-23 NOTE — Progress Notes (Signed)
Initial Nutrition Assessment  DOCUMENTATION CODES:   Severe malnutrition in context of chronic illness  INTERVENTION:   -Ensure Plus High Protein po BID, each supplement provides 350 kcal and 20 grams of protein.   -Continue feeding assistance  NUTRITION DIAGNOSIS:   Severe Malnutrition related to chronic illness (dementia) as evidenced by severe fat depletion, severe muscle depletion.  GOAL:   Patient will meet greater than or equal to 90% of their needs  MONITOR:   PO intake, Supplement acceptance, Labs, Weight trends, I & O's  REASON FOR ASSESSMENT:   Malnutrition Screening Tool    ASSESSMENT:   PMH of Alzheimer's dementia, asthma, BPH, CAD, HTN, COPD, GERD, HLD, urinary retention with Foley catheter present to the hospital with complaints of fever, confusion.  Patient has been on hospice prior to admission secondary to dementia.  Currently being treated for a UTI.  Patient in room, confused and not able to give a lot of information. States he has no appetite. Ensure at bedside, drank ~25%. Offered more to patient but he declined and said he didn't want it cold. Requires feeding assistance given dementia and has mitts on hands currently.  Pt only consuming ~25% of meals. Noted that discharge orders have been placed now.  Per weight records, weight is trended down since 2023. Weight for this admission was copied over from 12/04/22.   Medications: Senokot  Labs reviewed: CBGs: 66-134   NUTRITION - FOCUSED PHYSICAL EXAM:  Flowsheet Row Most Recent Value  Orbital Region Moderate depletion  Upper Arm Region Severe depletion  Thoracic and Lumbar Region Moderate depletion  Buccal Region Severe depletion  Temple Region Moderate depletion  Clavicle Bone Region Severe depletion  Clavicle and Acromion Bone Region Severe depletion  Scapular Bone Region Severe depletion  Dorsal Hand Unable to assess  [mitts]  Patellar Region Severe depletion  Anterior Thigh Region Severe  depletion  Posterior Calf Region Severe depletion  Edema (RD Assessment) None  Hair Reviewed  Eyes Reviewed  Mouth Reviewed  [no teeth]  Skin Reviewed  Nails Unable to assess       Diet Order:   Diet Order             Diet - low sodium heart healthy           DIET DYS 3           DIET DYS 3 Room service appropriate? Yes with Assist; Fluid consistency: Thin  Diet effective now                   EDUCATION NEEDS:   Not appropriate for education at this time  Skin:  Skin Assessment: Skin Integrity Issues: Skin Integrity Issues:: Stage I Stage I: mid sacrum  Last BM:  11/1 -type 6  Height:   Ht Readings from Last 1 Encounters:  02/08/23 5\' 8"  (1.727 m)    Weight:   Wt Readings from Last 1 Encounters:  02/08/23 63.1 kg     BMI:  Body mass index is 21.15 kg/m.  Estimated Nutritional Needs:   Kcal:  1600-1800  Protein:  85-95g  Fluid:  1.8L/day   Tilda Franco, MS, RD, LDN Inpatient Clinical Dietitian Contact information available via Amion

## 2023-02-23 NOTE — Consult Note (Signed)
[facility] Liaison Note  02/23/2023  COULTON SCHLINK May 17, 1932 [id number]  Location: RN Hospital Liaison screened the patient remotely at [facility].  Insurance: [employer]   KITO CUFFE is a 89 y.o. male who is a Primary Care Patient of [name], MD-[facility]. The patient was screened for readmission hospitalization with noted extreme risk score for unplanned readmission risk with 2 IP/1 ED in 6 months.  The patient was assessed for potential Care Management service needs for post hospital transition for care coordination. Review of patient's electronic medical record reveals patient was Acute cystitis without hematuria. Pt will discharge to SNF [facility] (SNF). Facility will continue to address his ongoing needs.    [facility] Care Management/Population Health does not replace or interfere with any arrangements made by the Inpatient Transition of Care team.   For questions contact:   Elliot Cousin, RN, [facility] Liaison [place]   [facility], Population Health Office Hours MTWF  8:00 am-6:00 pm Direct Dial: [phone] mobile [phone] [Office toll free line] Office Hours are M-F 8:30 - 5 pm [email or web address]

## 2023-02-23 NOTE — Plan of Care (Signed)
  Problem: Fluid Volume: Goal: Hemodynamic stability will improve Outcome: Progressing   Problem: Clinical Measurements: Goal: Diagnostic test results will improve Outcome: Progressing Goal: Signs and symptoms of infection will decrease Outcome: Progressing   Problem: Respiratory: Goal: Ability to maintain adequate ventilation will improve Outcome: Progressing   Problem: Clinical Measurements: Goal: Ability to maintain clinical measurements within normal limits will improve Outcome: Progressing Goal: Will remain free from infection Outcome: Progressing Goal: Diagnostic test results will improve Outcome: Progressing Goal: Respiratory complications will improve Outcome: Progressing Goal: Cardiovascular complication will be avoided Outcome: Progressing   Problem: Activity: Goal: Risk for activity intolerance will decrease Outcome: Progressing   Problem: Nutrition: Goal: Adequate nutrition will be maintained Outcome: Progressing   Problem: Elimination: Goal: Will not experience complications related to bowel motility Outcome: Progressing Goal: Will not experience complications related to urinary retention Outcome: Progressing   Problem: Pain Managment: Goal: General experience of comfort will improve Outcome: Progressing   Problem: Safety: Goal: Ability to remain free from injury will improve Outcome: Progressing   Problem: Skin Integrity: Goal: Risk for impaired skin integrity will decrease Outcome: Progressing

## 2023-02-23 NOTE — TOC Transition Note (Signed)
Transition of Care [facility]) - CM/SW Discharge Note   Patient Details  Name: Alexander [name] MRN: [id number] Date of Birth: Jan 07, 1933  Transition of Care [facility]) CM/SW Contact:  Armanda Heritage, RN Phone Number: 02/23/2023, 11:38 AM   Clinical Narrative:    CM confirmed with [name] that [facility].  Outreached to admissions rep, patient will dc to room 128A, report number [phone].  Patient will transport by [facility].   CM has pre-arranged HHPT/OT services to start once patient returns home post respite stay.  [facility] rep Kandee Keen has accepted [facility] referral.  CM spoke with [facility] agency, who [name] reports patient has used in the past and agency reports they will outreach to [place] and assist with filing documents for [employer] to utilize the 20 hour custodial benefit.  MD and RN updated of transfer arrangements. No further TOC needs identified at this time.   Final next level of care: Skilled Nursing Facility (respite) Barriers to Discharge: No Barriers Identified   Patient Goals and CMS Choice CMS Medicare.gov Compare Post Acute Care list provided to:: Patient Represenative (must comment) ([place](POA),Sonja (Daughter)  [phone] (Mobile)) Choice offered to / list presented to : [facility] POA / Guardian  Discharge Placement                Patient chooses bed at: [facility] Patient to be transferred to facility by: [facility] Name of family member notified: Sonja Patient and family notified of of transfer: 02/23/23  Discharge Plan and Services Additional resources added to the After Visit Summary for                  DME Arranged:  (waiting for pt eval)         HH Arranged: PT, OT HH Agency: [facility] Date [facility] Agency Contacted: 02/23/23 Time HH Agency Contacted: [phone] Representative spoke with at [facility] Agency: Kandee Keen  Social Determinants of Health (SDOH) Interventions SDOH Screenings   Food Insecurity: No Food Insecurity  (02/09/2023)  Housing: Patient Declined (02/09/2023)  Transportation Needs: No Transportation Needs (02/09/2023)  Utilities: Not At Risk (02/09/2023)  Depression (PHQ2-9): Low Risk  (12/16/2020)  Financial Resource Strain: Low Risk  (09/07/2017)  Physical Activity: Sufficiently Active (09/07/2017)  Social Connections: Socially Integrated (09/07/2017)  Stress: No Stress Concern Present (09/07/2017)  Tobacco Use: Medium Risk (02/08/2023)     Readmission Risk Interventions    11/30/2022    9:58 AM  Readmission Risk Prevention Plan  Transportation Screening Complete  PCP or Specialist Appt within 5-7 Days Complete  Home Care Screening Complete  Medication Review (RN CM) Complete
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
## 2023-02-27 DIAGNOSIS — Z466 Encounter for fitting and adjustment of urinary device: Secondary | ICD-10-CM | POA: Diagnosis not present

## 2023-02-27 DIAGNOSIS — D649 Anemia, unspecified: Secondary | ICD-10-CM | POA: Diagnosis not present

## 2023-02-27 DIAGNOSIS — J4489 Other specified chronic obstructive pulmonary disease: Secondary | ICD-10-CM | POA: Diagnosis not present

## 2023-02-27 DIAGNOSIS — K219 Gastro-esophageal reflux disease without esophagitis: Secondary | ICD-10-CM | POA: Diagnosis not present

## 2023-02-27 DIAGNOSIS — N3000 Acute cystitis without hematuria: Secondary | ICD-10-CM | POA: Diagnosis not present

## 2023-02-27 DIAGNOSIS — I700 Atherosclerosis of aorta: Secondary | ICD-10-CM | POA: Diagnosis not present

## 2023-02-27 DIAGNOSIS — R1310 Dysphagia, unspecified: Secondary | ICD-10-CM | POA: Diagnosis not present

## 2023-02-27 DIAGNOSIS — R339 Retention of urine, unspecified: Secondary | ICD-10-CM | POA: Diagnosis not present

## 2023-02-27 DIAGNOSIS — H409 Unspecified glaucoma: Secondary | ICD-10-CM | POA: Diagnosis not present

## 2023-02-27 DIAGNOSIS — Z9181 History of falling: Secondary | ICD-10-CM | POA: Diagnosis not present

## 2023-02-27 DIAGNOSIS — F02B4 Dementia in other diseases classified elsewhere, moderate, with anxiety: Secondary | ICD-10-CM | POA: Diagnosis not present

## 2023-02-27 DIAGNOSIS — E785 Hyperlipidemia, unspecified: Secondary | ICD-10-CM | POA: Diagnosis not present

## 2023-02-27 DIAGNOSIS — I2510 Atherosclerotic heart disease of native coronary artery without angina pectoris: Secondary | ICD-10-CM | POA: Diagnosis not present

## 2023-02-27 DIAGNOSIS — L89152 Pressure ulcer of sacral region, stage 2: Secondary | ICD-10-CM | POA: Diagnosis not present

## 2023-02-27 DIAGNOSIS — F05 Delirium due to known physiological condition: Secondary | ICD-10-CM | POA: Diagnosis not present

## 2023-02-27 DIAGNOSIS — F32A Depression, unspecified: Secondary | ICD-10-CM | POA: Diagnosis not present

## 2023-02-27 DIAGNOSIS — F02B3 Dementia in other diseases classified elsewhere, moderate, with mood disturbance: Secondary | ICD-10-CM | POA: Diagnosis not present

## 2023-02-27 DIAGNOSIS — Z87891 Personal history of nicotine dependence: Secondary | ICD-10-CM | POA: Diagnosis not present

## 2023-02-27 DIAGNOSIS — K649 Unspecified hemorrhoids: Secondary | ICD-10-CM | POA: Diagnosis not present

## 2023-02-27 DIAGNOSIS — G309 Alzheimer's disease, unspecified: Secondary | ICD-10-CM | POA: Diagnosis not present

## 2023-02-27 DIAGNOSIS — I119 Hypertensive heart disease without heart failure: Secondary | ICD-10-CM | POA: Diagnosis not present

## 2023-02-27 DIAGNOSIS — F02B0 Dementia in other diseases classified elsewhere, moderate, without behavioral disturbance, psychotic disturbance, mood disturbance, and anxiety: Secondary | ICD-10-CM | POA: Diagnosis not present

## 2023-03-01 DIAGNOSIS — Z978 Presence of other specified devices: Secondary | ICD-10-CM | POA: Diagnosis not present

## 2023-03-01 DIAGNOSIS — I10 Essential (primary) hypertension: Secondary | ICD-10-CM | POA: Diagnosis not present

## 2023-03-01 DIAGNOSIS — N320 Bladder-neck obstruction: Secondary | ICD-10-CM | POA: Diagnosis not present

## 2023-03-01 DIAGNOSIS — R42 Dizziness and giddiness: Secondary | ICD-10-CM | POA: Diagnosis not present

## 2023-03-01 DIAGNOSIS — D649 Anemia, unspecified: Secondary | ICD-10-CM | POA: Diagnosis not present

## 2023-03-01 DIAGNOSIS — R3129 Other microscopic hematuria: Secondary | ICD-10-CM | POA: Diagnosis not present

## 2023-03-01 DIAGNOSIS — R6510 Systemic inflammatory response syndrome (SIRS) of non-infectious origin without acute organ dysfunction: Secondary | ICD-10-CM | POA: Diagnosis not present

## 2023-03-01 DIAGNOSIS — G301 Alzheimer's disease with late onset: Secondary | ICD-10-CM | POA: Diagnosis not present

## 2023-03-01 DIAGNOSIS — F02818 Dementia in other diseases classified elsewhere, unspecified severity, with other behavioral disturbance: Secondary | ICD-10-CM | POA: Diagnosis not present

## 2023-03-01 DIAGNOSIS — R509 Fever, unspecified: Secondary | ICD-10-CM | POA: Diagnosis not present

## 2023-03-14 DIAGNOSIS — I10 Essential (primary) hypertension: Secondary | ICD-10-CM | POA: Diagnosis not present

## 2023-03-14 DIAGNOSIS — R051 Acute cough: Secondary | ICD-10-CM | POA: Diagnosis not present

## 2023-03-14 DIAGNOSIS — J029 Acute pharyngitis, unspecified: Secondary | ICD-10-CM | POA: Diagnosis not present

## 2023-03-14 DIAGNOSIS — G301 Alzheimer's disease with late onset: Secondary | ICD-10-CM | POA: Diagnosis not present

## 2023-03-14 DIAGNOSIS — F02818 Dementia in other diseases classified elsewhere, unspecified severity, with other behavioral disturbance: Secondary | ICD-10-CM | POA: Diagnosis not present

## 2023-03-14 DIAGNOSIS — Z1152 Encounter for screening for COVID-19: Secondary | ICD-10-CM | POA: Diagnosis not present

## 2023-03-14 DIAGNOSIS — R0981 Nasal congestion: Secondary | ICD-10-CM | POA: Diagnosis not present

## 2023-03-14 DIAGNOSIS — J069 Acute upper respiratory infection, unspecified: Secondary | ICD-10-CM | POA: Diagnosis not present

## 2023-03-19 ENCOUNTER — Inpatient Hospital Stay (HOSPITAL_COMMUNITY)
Admission: EM | Admit: 2023-03-19 | Discharge: 2023-03-24 | DRG: 698 | Disposition: A | Discharge status: Hospice | Payer: PPO | Attending: Internal Medicine | Admitting: Internal Medicine

## 2023-03-19 ENCOUNTER — Other Ambulatory Visit: Payer: Self-pay

## 2023-03-19 ENCOUNTER — Emergency Department (HOSPITAL_COMMUNITY): Payer: PPO

## 2023-03-19 ENCOUNTER — Encounter (HOSPITAL_COMMUNITY): Payer: Self-pay

## 2023-03-19 DIAGNOSIS — F0283 Dementia in other diseases classified elsewhere, unspecified severity, with mood disturbance: Secondary | ICD-10-CM | POA: Diagnosis not present

## 2023-03-19 DIAGNOSIS — T83518A Infection and inflammatory reaction due to other urinary catheter, initial encounter: Principal | ICD-10-CM | POA: Diagnosis present

## 2023-03-19 DIAGNOSIS — N3289 Other specified disorders of bladder: Secondary | ICD-10-CM | POA: Diagnosis present

## 2023-03-19 DIAGNOSIS — F419 Anxiety disorder, unspecified: Secondary | ICD-10-CM | POA: Diagnosis present

## 2023-03-19 DIAGNOSIS — G9341 Metabolic encephalopathy: Secondary | ICD-10-CM | POA: Diagnosis not present

## 2023-03-19 DIAGNOSIS — Z800 Family history of malignant neoplasm of digestive organs: Secondary | ICD-10-CM

## 2023-03-19 DIAGNOSIS — R5381 Other malaise: Secondary | ICD-10-CM | POA: Diagnosis present

## 2023-03-19 DIAGNOSIS — N400 Enlarged prostate without lower urinary tract symptoms: Secondary | ICD-10-CM | POA: Diagnosis present

## 2023-03-19 DIAGNOSIS — I672 Cerebral atherosclerosis: Secondary | ICD-10-CM | POA: Diagnosis not present

## 2023-03-19 DIAGNOSIS — Z87891 Personal history of nicotine dependence: Secondary | ICD-10-CM

## 2023-03-19 DIAGNOSIS — N390 Urinary tract infection, site not specified: Secondary | ICD-10-CM | POA: Diagnosis not present

## 2023-03-19 DIAGNOSIS — Z515 Encounter for palliative care: Secondary | ICD-10-CM | POA: Diagnosis not present

## 2023-03-19 DIAGNOSIS — I2510 Atherosclerotic heart disease of native coronary artery without angina pectoris: Secondary | ICD-10-CM | POA: Diagnosis present

## 2023-03-19 DIAGNOSIS — E785 Hyperlipidemia, unspecified: Secondary | ICD-10-CM | POA: Diagnosis present

## 2023-03-19 DIAGNOSIS — Z83438 Family history of other disorder of lipoprotein metabolism and other lipidemia: Secondary | ICD-10-CM

## 2023-03-19 DIAGNOSIS — Z0389 Encounter for observation for other suspected diseases and conditions ruled out: Secondary | ICD-10-CM | POA: Diagnosis not present

## 2023-03-19 DIAGNOSIS — Y846 Urinary catheterization as the cause of abnormal reaction of the patient, or of later complication, without mention of misadventure at the time of the procedure: Secondary | ICD-10-CM | POA: Diagnosis present

## 2023-03-19 DIAGNOSIS — H9190 Unspecified hearing loss, unspecified ear: Secondary | ICD-10-CM | POA: Diagnosis not present

## 2023-03-19 DIAGNOSIS — I700 Atherosclerosis of aorta: Secondary | ICD-10-CM | POA: Diagnosis not present

## 2023-03-19 DIAGNOSIS — Z823 Family history of stroke: Secondary | ICD-10-CM | POA: Diagnosis not present

## 2023-03-19 DIAGNOSIS — I10 Essential (primary) hypertension: Secondary | ICD-10-CM | POA: Diagnosis not present

## 2023-03-19 DIAGNOSIS — Z8042 Family history of malignant neoplasm of prostate: Secondary | ICD-10-CM

## 2023-03-19 DIAGNOSIS — Z1152 Encounter for screening for COVID-19: Secondary | ICD-10-CM | POA: Diagnosis not present

## 2023-03-19 DIAGNOSIS — F02818 Dementia in other diseases classified elsewhere, unspecified severity, with other behavioral disturbance: Secondary | ICD-10-CM | POA: Diagnosis not present

## 2023-03-19 DIAGNOSIS — R531 Weakness: Secondary | ICD-10-CM | POA: Diagnosis present

## 2023-03-19 DIAGNOSIS — Z8249 Family history of ischemic heart disease and other diseases of the circulatory system: Secondary | ICD-10-CM | POA: Diagnosis not present

## 2023-03-19 DIAGNOSIS — G309 Alzheimer's disease, unspecified: Secondary | ICD-10-CM | POA: Diagnosis present

## 2023-03-19 DIAGNOSIS — J4489 Other specified chronic obstructive pulmonary disease: Secondary | ICD-10-CM | POA: Diagnosis present

## 2023-03-19 DIAGNOSIS — R4182 Altered mental status, unspecified: Secondary | ICD-10-CM | POA: Diagnosis not present

## 2023-03-19 DIAGNOSIS — K219 Gastro-esophageal reflux disease without esophagitis: Secondary | ICD-10-CM | POA: Diagnosis not present

## 2023-03-19 DIAGNOSIS — I959 Hypotension, unspecified: Secondary | ICD-10-CM | POA: Diagnosis not present

## 2023-03-19 DIAGNOSIS — Z79899 Other long term (current) drug therapy: Secondary | ICD-10-CM | POA: Diagnosis not present

## 2023-03-19 DIAGNOSIS — R0689 Other abnormalities of breathing: Secondary | ICD-10-CM | POA: Diagnosis not present

## 2023-03-19 DIAGNOSIS — R0902 Hypoxemia: Secondary | ICD-10-CM | POA: Diagnosis not present

## 2023-03-19 LAB — COMPREHENSIVE METABOLIC PANEL
ALT: 11 U/L (ref 0–44)
AST: 15 U/L (ref 15–41)
Albumin: 3.5 g/dL (ref 3.5–5.0)
Alkaline Phosphatase: 57 U/L (ref 38–126)
Anion gap: 8 (ref 5–15)
BUN: 17 mg/dL (ref 8–23)
CO2: 30 mmol/L (ref 22–32)
Calcium: 9 mg/dL (ref 8.9–10.3)
Chloride: 97 mmol/L — ABNORMAL LOW (ref 98–111)
Creatinine, Ser: 0.8 mg/dL (ref 0.61–1.24)
GFR, Estimated: >60 mL/min (ref >60)
Glucose, Bld: 102 mg/dL — ABNORMAL HIGH (ref 70–99)
Potassium: 4.3 mmol/L (ref 3.5–5.1)
Sodium: 135 mmol/L (ref 135–145)
Total Bilirubin: 0.9 mg/dL (ref <1.2)
Total Protein: 6.3 g/dL — ABNORMAL LOW (ref 6.5–8.1)

## 2023-03-19 LAB — CBC WITH DIFFERENTIAL/PLATELET
Abs Immature Granulocytes: 0.14 10*3/uL — ABNORMAL HIGH (ref 0.00–0.07)
Basophils Absolute: 0.1 10*3/uL (ref 0.0–0.1)
Basophils Relative: 0 %
Eosinophils Absolute: 0 10*3/uL (ref 0.0–0.5)
Eosinophils Relative: 0 %
HCT: 32.7 % — ABNORMAL LOW (ref 39.0–52.0)
Hemoglobin: 10.2 g/dL — ABNORMAL LOW (ref 13.0–17.0)
Immature Granulocytes: 1 %
Lymphocytes Relative: 4 %
Lymphs Abs: 1 10*3/uL (ref 0.7–4.0)
MCH: 29.5 pg (ref 26.0–34.0)
MCHC: 31.2 g/dL (ref 30.0–36.0)
MCV: 94.5 fL (ref 80.0–100.0)
Monocytes Absolute: 1.6 10*3/uL — ABNORMAL HIGH (ref 0.1–1.0)
Monocytes Relative: 7 %
Neutro Abs: 19.5 10*3/uL — ABNORMAL HIGH (ref 1.7–7.7)
Neutrophils Relative %: 88 %
Platelets: 276 10*3/uL (ref 150–400)
RBC: 3.46 MIL/uL — ABNORMAL LOW (ref 4.22–5.81)
RDW: 13.2 % (ref 11.5–15.5)
WBC: 22.2 10*3/uL — ABNORMAL HIGH (ref 4.0–10.5)
nRBC: 0 % (ref 0.0–0.2)

## 2023-03-19 LAB — URINALYSIS, W/ REFLEX TO CULTURE (INFECTION SUSPECTED)
Bilirubin Urine: NEGATIVE
Glucose, UA: NEGATIVE mg/dL
Ketones, ur: NEGATIVE mg/dL
Nitrite: NEGATIVE
Protein, ur: 100 mg/dL — AB
Specific Gravity, Urine: 1.013 (ref 1.005–1.030)
WBC, UA: >50 WBC/hpf (ref 0–5)
pH: 6 (ref 5.0–8.0)

## 2023-03-19 LAB — I-STAT VENOUS BLOOD GAS, ED
Acid-Base Excess: 6 mmol/L — ABNORMAL HIGH (ref 0.0–2.0)
Bicarbonate: 32.5 mmol/L — ABNORMAL HIGH (ref 20.0–28.0)
Calcium, Ion: 1.22 mmol/L (ref 1.15–1.40)
HCT: 34 % — ABNORMAL LOW (ref 39.0–52.0)
Hemoglobin: 11.6 g/dL — ABNORMAL LOW (ref 13.0–17.0)
O2 Saturation: 50 %
Potassium: 4.3 mmol/L (ref 3.5–5.1)
Sodium: 136 mmol/L (ref 135–145)
TCO2: 34 mmol/L — ABNORMAL HIGH (ref 22–32)
pCO2, Ven: 54.3 mm[Hg] (ref 44–60)
pH, Ven: 7.385 (ref 7.25–7.43)
pO2, Ven: 28 mm[Hg] — CL (ref 32–45)

## 2023-03-19 LAB — I-STAT CG4 LACTIC ACID, ED: Lactic Acid, Venous: 1.1 mmol/L (ref 0.5–1.9)

## 2023-03-19 LAB — RESP PANEL BY RT-PCR (RSV, FLU A&B, COVID)  RVPGX2
Influenza A by PCR: NEGATIVE
Influenza B by PCR: NEGATIVE
Resp Syncytial Virus by PCR: NEGATIVE
SARS Coronavirus 2 by RT PCR: NEGATIVE

## 2023-03-19 MED ORDER — SODIUM CHLORIDE 0.9% FLUSH
3.0000 mL | Freq: Two times a day (BID) | INTRAVENOUS | Status: DC
  Administered 2023-03-20 – 2023-03-23 (×8): 3 mL via INTRAVENOUS

## 2023-03-19 MED ORDER — SODIUM CHLORIDE 0.9 % IV SOLN
2.0000 g | INTRAVENOUS | Status: DC
  Administered 2023-03-20 – 2023-03-22 (×3): 2 g via INTRAVENOUS
  Filled 2023-03-19 (×4): qty 20

## 2023-03-19 MED ORDER — ACETAMINOPHEN 500 MG PO TABS
1000.0000 mg | ORAL_TABLET | Freq: Four times a day (QID) | ORAL | Status: DC | PRN
  Filled 2023-03-19: qty 2

## 2023-03-19 MED ORDER — ENOXAPARIN SODIUM 40 MG/0.4ML IJ SOSY
40.0000 mg | PREFILLED_SYRINGE | INTRAMUSCULAR | Status: DC
  Administered 2023-03-20 – 2023-03-22 (×4): 40 mg via SUBCUTANEOUS
  Filled 2023-03-19 (×5): qty 0.4

## 2023-03-19 MED ORDER — LACTATED RINGERS IV BOLUS
1000.0000 mL | Freq: Once | INTRAVENOUS | Status: AC
  Administered 2023-03-19: 1000 mL via INTRAVENOUS

## 2023-03-19 MED ORDER — SODIUM CHLORIDE 0.9 % IV SOLN
2.0000 g | Freq: Once | INTRAVENOUS | Status: AC
  Administered 2023-03-19: 2 g via INTRAVENOUS
  Filled 2023-03-19: qty 20
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
## 2023-03-19 NOTE — ED Triage Notes (Signed)
Pt presents via EMS with c/o AMS. Pt has hx of dementia so unknown baseline mentation at this time. EMS reports last seen WNL at 1508 when returned from adult daycare. EMS reports pt saturation 84% on room air on arrival. Baseline Alert and oriented to self only at this time.
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
## 2023-03-20 ENCOUNTER — Inpatient Hospital Stay (HOSPITAL_COMMUNITY): Payer: PPO

## 2023-03-20 DIAGNOSIS — N390 Urinary tract infection, site not specified: Secondary | ICD-10-CM | POA: Diagnosis not present

## 2023-03-20 LAB — CBC
HCT: 31 % — ABNORMAL LOW (ref 39.0–52.0)
Hemoglobin: 9.9 g/dL — ABNORMAL LOW (ref 13.0–17.0)
MCH: 30.7 pg (ref 26.0–34.0)
MCHC: 31.9 g/dL (ref 30.0–36.0)
MCV: 96.3 fL (ref 80.0–100.0)
Platelets: 246 10*3/uL (ref 150–400)
RBC: 3.22 MIL/uL — ABNORMAL LOW (ref 4.22–5.81)
RDW: 13.2 % (ref 11.5–15.5)
WBC: 21.9 10*3/uL — ABNORMAL HIGH (ref 4.0–10.5)
nRBC: 0 % (ref 0.0–0.2)

## 2023-03-20 LAB — URINE CULTURE

## 2023-03-20 LAB — BASIC METABOLIC PANEL
Anion gap: 8 (ref 5–15)
BUN: 14 mg/dL (ref 8–23)
CO2: 30 mmol/L (ref 22–32)
Calcium: 8.7 mg/dL — ABNORMAL LOW (ref 8.9–10.3)
Chloride: 99 mmol/L (ref 98–111)
Creatinine, Ser: 0.76 mg/dL (ref 0.61–1.24)
GFR, Estimated: >60 mL/min (ref >60)
Glucose, Bld: 78 mg/dL (ref 70–99)
Potassium: 4.1 mmol/L (ref 3.5–5.1)
Sodium: 137 mmol/L (ref 135–145)

## 2023-03-20 LAB — PHOSPHORUS: Phosphorus: 3.1 mg/dL (ref 2.5–4.6)

## 2023-03-20 LAB — MAGNESIUM: Magnesium: 1.8 mg/dL (ref 1.7–2.4)

## 2023-03-20 MED ORDER — ESCITALOPRAM OXALATE 20 MG PO TABS
20.0000 mg | ORAL_TABLET | Freq: Every day | ORAL | Status: DC
  Administered 2023-03-20 – 2023-03-23 (×4): 20 mg via ORAL
  Filled 2023-03-20 (×4): qty 1

## 2023-03-20 MED ORDER — ENSURE ENLIVE PO LIQD
237.0000 mL | Freq: Three times a day (TID) | ORAL | Status: DC
  Administered 2023-03-20 – 2023-03-24 (×12): 237 mL via ORAL

## 2023-03-20 MED ORDER — CHLORHEXIDINE GLUCONATE CLOTH 2 % EX PADS
6.0000 | MEDICATED_PAD | Freq: Every day | CUTANEOUS | Status: DC
  Administered 2023-03-20 – 2023-03-23 (×4): 6 via TOPICAL

## 2023-03-20 MED ORDER — HYOSCYAMINE SULFATE 0.125 MG SL SUBL: 0.1250 mg | SUBLINGUAL_TABLET | Freq: Three times a day (TID) | SUBLINGUAL | Status: DC | PRN

## 2023-03-20 MED ORDER — SODIUM CHLORIDE 0.9 % IV SOLN: INTRAVENOUS | Status: AC

## 2023-03-20 MED ORDER — ALBUTEROL SULFATE (2.5 MG/3ML) 0.083% IN NEBU: 2.5000 mg | INHALATION_SOLUTION | Freq: Four times a day (QID) | RESPIRATORY_TRACT | Status: DC | PRN

## 2023-03-20 MED ORDER — RISPERIDONE 0.5 MG PO TABS
0.5000 mg | ORAL_TABLET | Freq: Every day | ORAL | Status: DC
  Administered 2023-03-20 – 2023-03-23 (×4): 0.5 mg via ORAL
  Filled 2023-03-20 (×5): qty 1

## 2023-03-20 MED ORDER — TRAZODONE HCL 50 MG PO TABS
100.0000 mg | ORAL_TABLET | Freq: Every day | ORAL | Status: DC
  Administered 2023-03-20 – 2023-03-23 (×4): 100 mg via ORAL
  Filled 2023-03-20 (×4): qty 2

## 2023-03-20 NOTE — Progress Notes (Signed)
Alexander [name] [facility] Hospitalized Hospice Patient  Alexander [name] is a current [facility] patient with a terminal diagnosis of Dementia. He has been experiencing some worsening of mentation over the last few days for which hospice visits have been made. He was transported to the hospital on the evening of 11.25.24 due to worsening mental status and admitted to the hospital for AMS and possible UTI. [facility] was not notified of this hospital admission until communication was received from [facility]. Per Dr. Patric Dykes with [facility] this is a related hospital admission.   Alexander [name] is resting in bed, pleasantly confused with no outward signs of distress. He has worked minimally with PT and OT today and does not talk much today. He will eat when fed but otherwise does not attempt to feed himself.   He is inpatient appropriate with need for IVAB in treatment of UTI.  Vital Signs: 98.2/76/20     116/63    spO2 100% 1.5L nasal cannula I/O: 1216/300 Abnormal labs: Ca+ 8.7, WBC 21.9, RBC 3.22, Hgb 9.9, Hct 31, Urinalysis showing likely UTI pending culture Diagnostics: CXR and Heat CT both without significant abnormality IV/PRN Meds: NS @75cc /H, Rocephin 2g IV q24H, LR 1L bolus IV x1 PROBLEM LIST as per H&P Dr. Gray Bernhardt 11.25.24 Complicated UTI, associated with chronic indwelling catheter Sepsis secondary to above, improving History indwelling catheter for incontinence, BPH.  Recurrent urinary tract infections.  UA grossly consistent with infection which was obtained after Foley catheter exchanged in the ED. - Continue ceftriaxone 2 g IV every 24 hours, higher dosing in case there is concomitant bacteremia - Status post 1 L LR, lactate is normal.  Continue maintenance IV fluid with 75 cc/h - Follow-up urine culture, blood culture - Considering hospice status and limited life expectancy, could consider for suppressive antibiotics for urinary tract infection to limit  rehospitalization.   Encephalopathy, metabolic related to infection above - Treatment of UTI per above - Delirium precautions   Question left-sided weakness, strokelike symptoms Ongoing for the past 3 months, worse today. -CT head to evaluate for old stroke. -Turn every 2 hours, pressure injury prevention  Discharge Planning ongoing, likely once IVAB are complete Family contact:  Message left for daughter IDT: Updated   Goals of Care: Full Please don't hesitate to reach out for any hospice related question or concerns. Please use [facility] for discharge/transport as we contract this service for our current patients.  Thea Gist, BSN RN Hospice hospital liaison [phone]

## 2023-03-20 NOTE — Evaluation (Signed)
Occupational Therapy Evaluation Patient Details Name: Alexander [name] MRN: [id number] DOB: [birth year] Today's Date: 03/20/2023   History of Present Illness Pt is an 89 y.o. male admitted 11/25 with dx of complicated UTI and AMS. PMH: Alzheimer's disease, anxiety, BPH, CAD, COPD, depression, GERD, HLD, chronic urinary retention with chronic foley catheter   Clinical Impression   Pt has difficulty communicating needs and inconsistent participation. Pt unable to give history, per RN lives with daughter. Pt currently requires min A for mobility using RW, and mod A for most ADLs due to decreased task initiation, sequencing, problem solving, and increased need for verbal cueing for participation. Pt  would benefit from [facility] upon DC to maximize safety and independence at home, unsure of DME needs at this time, will continue to follow acutely.       If plan is discharge home, recommend the following: A lot of help with walking and/or transfers;A lot of help with bathing/dressing/bathroom;Assistance with cooking/housework;Assistance with feeding;Direct supervision/assist for medications management;Assist for transportation;Help with stairs or ramp for entrance;Supervision due to cognitive status    Functional Status Assessment  Patient has had a recent decline in their functional status and demonstrates the ability to make significant improvements in function in a reasonable and predictable amount of time.  Equipment Recommendations  Other (comment) (TBD)    Recommendations for Other Services       Precautions / Restrictions Precautions Precautions: Fall;Other (comment) Precaution Comments: foley Restrictions Weight Bearing Restrictions: No      Mobility Bed Mobility Overal bed mobility: Needs Assistance Bed Mobility: Supine to Sit, Sit to Supine     Supine to sit: Min assist, HOB elevated, Used rails Sit to supine: Min assist, Used rails   General bed mobility comments: Assist  primarily for initiation and sequencing.    Transfers Overall transfer level: Needs assistance Equipment used: Rolling walker (2 wheels) Transfers: Sit to/from Stand Sit to Stand: Min assist           General transfer comment: min A to power up and for balance      Balance Overall balance assessment: Needs assistance Sitting-balance support: Feet supported, No upper extremity supported Sitting balance-Leahy Scale: Fair Sitting balance - Comments: EOB   Standing balance support: Bilateral upper extremity supported, During functional activity, Reliant on assistive device for balance Standing balance-Leahy Scale: Poor Standing balance comment: poor safety awareness, reliant on RW                           ADL either performed or assessed with clinical judgement   ADL Overall ADL's : Needs assistance/impaired Eating/Feeding: Minimal assistance;Sitting   Grooming: Moderate assistance;Sitting   Upper Body Bathing: Moderate assistance;Sitting   Lower Body Bathing: Maximal assistance;Sitting/lateral leans   Upper Body Dressing : Moderate assistance;Sitting   Lower Body Dressing: Maximal assistance;Sitting/lateral leans   Toilet Transfer: Minimal assistance   Toileting- Clothing Manipulation and Hygiene: Moderate assistance       Functional mobility during ADLs: Minimal assistance General ADL Comments: Pt has AMS, decreased sequencing, task initiation, needs verbal/tactile cueing.     Vision         Perception         Praxis         Pertinent Vitals/Pain Pain Assessment Pain Assessment: No/denies pain     Extremity/Trunk Assessment Upper Extremity Assessment Upper Extremity Assessment: RUE deficits/detail;LUE deficits/detail RUE Deficits / Details: B shoulder stiffness, decreased sequencing, overall good strength RUE: Shoulder  pain with ROM RUE Coordination: decreased gross motor LUE Deficits / Details: B shoulder stiffness, decreased  sequencing, overall good strength LUE Coordination: decreased gross motor   Lower Extremity Assessment Lower Extremity Assessment: Defer to PT evaluation   Cervical / Trunk Assessment Cervical / Trunk Assessment: Kyphotic   Communication Communication Communication: Difficulty following commands/understanding Following commands: Follows one step commands inconsistently Cueing Techniques: Verbal cues;Gestural cues   Cognition Arousal: Alert Behavior During Therapy: Flat affect Overall Cognitive Status: No family/caregiver present to determine baseline cognitive functioning                                 General Comments: dementia at baseline. Oriented to self only.     General Comments  On entry, pt had removed [place] with SpO2 83% on RA. 1L O2 replaced with improvement to 90%.    Exercises     Shoulder Instructions      Home Living Family/patient expects to be discharged to:: Private residence Living Arrangements: Children Available Help at Discharge: Family                             Additional Comments: no family present to provide history/PLOF info & pt unable. Per chart, pt lives with family, goes to adult daycare at [place], and is on home hospice.      Prior Functioning/Environment Prior Level of Function : Patient poor historian/Family not available             Mobility Comments: Per chart, amb with RW vs no AD at baseline. ADLs Comments: likely needs assistance        OT Problem List: Decreased strength;Decreased range of motion;Decreased activity tolerance;Impaired balance (sitting and/or standing);Decreased safety awareness;Decreased knowledge of use of DME or AE;Impaired UE functional use      OT Treatment/Interventions: Self-care/ADL training;Therapeutic exercise;DME and/or AE instruction;Manual therapy;Therapeutic activities;Patient/family education;Balance training    OT Goals(Current goals can be found in the care plan  section) Acute Rehab OT Goals Patient Stated Goal: unable to set goals OT Goal Formulation: With patient Time For Goal Achievement: 04/03/23 Potential to Achieve Goals: Fair  OT Frequency: Min 1X/week    Co-evaluation              AM-PAC OT "6 Clicks" Daily Activity     Outcome Measure Help from another person eating meals?: A Little Help from another person taking care of personal grooming?: A Lot Help from another person toileting, which includes using toliet, bedpan, or urinal?: A Lot Help from another person bathing (including washing, rinsing, drying)?: A Lot Help from another person to put on and taking off regular upper body clothing?: A Lot Help from another person to put on and taking off regular lower body clothing?: A Lot 6 Click Score: 13   End of Session Equipment Utilized During Treatment: Gait belt;Rolling walker (2 wheels) Nurse Communication: Mobility status  Activity Tolerance: Patient tolerated treatment well Patient left: in bed;with call bell/phone within reach;with bed alarm set  OT Visit Diagnosis: Unsteadiness on feet (R26.81);Other abnormalities of gait and mobility (R26.89);Muscle weakness (generalized) (M62.81);Other symptoms and signs involving cognitive function                Time: 1240-1308 OT Time Calculation (min): 28 min Charges:  OT General Charges $OT Visit: 1 Visit OT Evaluation $OT Eval Moderate Complexity: 1 Mod OT Treatments $Self Care/Home  Management : 8-22 mins  [facility], OTR/L   Alexis Goodell 03/20/2023, 1:18 PM

## 2023-03-20 NOTE — Plan of Care (Signed)
  Problem: Nutrition: Goal: Adequate nutrition will be maintained Outcome: Progressing   Problem: Elimination: Goal: Will not experience complications related to bowel motility Outcome: Progressing Goal: Will not experience complications related to urinary retention Outcome: Progressing Note: Foley was exchanged during this admission.    Problem: Safety: Goal: Ability to remain free from injury will improve Outcome: Progressing   Problem: Clinical Measurements: Goal: Respiratory complications will improve Outcome: Not Progressing Note: Pt found by physical therapist with nasal cannula removed. O2 sat was 83% on RA. Pt placed on 1L [place] with improvement in saturations.

## 2023-03-20 NOTE — TOC CM/SW Note (Signed)
Transition of Care [facility]) - Inpatient Brief Assessment   Patient Details  Name: Alexander [name] MRN: [id number] Date of Birth: 08-04-1932  Transition of Care [facility]) CM/SW Contact:    Tom-Johnson, Hershal Coria, RN Phone Number: 03/20/2023, 2:34 PM   Clinical Narrative:  Patient presented to the ED with Altered Mental Status, admitted with UTI, on IV abx. Patient has a chronic Foley Cath. On 1L O2 acute, does not use home O2.  Followed outpatient by [facility] for Hospice services. Patient goes to the [facility] for Dementia patients.   CM spoke with patient's daughter, [name] about post hospital transition. Patient is from home with [name] whom is his primary caregiver. Has eight children that assists at times. Patient has a cane, rollator and shower chair at home.  Sonja voiced concern about being able to care for patient at this time. States she recently had a Lumbar sx with Bending, Lifting and twisting restrictions and is having difficulty with caring for patient. Requests rehab recommended at this time.  PT/OT recommended Home health. [name] declined, states she will continue with Hospice services. States patient was active with [facility] care for Aide services but cancelled d/t cost.  PCP is [name], MD and uses [facility] on [address]   Patient not Medically ready for discharge.  CM will continue to follow as patient progresses with care towards discharge.             Transition of Care Asessment: Insurance and Status: Insurance coverage has been reviewed Patient has primary care physician: Yes Home environment has been reviewed: Yes Prior level of function:: Dependent Prior/Current Home Services: Current home services Social Determinants of Health Reivew: SDOH reviewed no interventions necessary Readmission risk has been reviewed: Yes Transition of care needs: transition of care needs identified, TOC will continue to  follow

## 2023-03-20 NOTE — Evaluation (Signed)
Physical Therapy Evaluation Patient Details Name: Alexander [name] MRN: [id number] DOB: 1933/02/17 Today's Date: 03/20/2023  History of Present Illness  Pt is an 89 y.o. male admitted 11/25 with dx of complicated UTI and AMS. PMH: Alzheimer's disease, anxiety, BPH, CAD, COPD, depression, GERD, HLD, chronic urinary retention with chronic foley catheter   Clinical Impression  Pt admitted with above diagnosis. PTA pt lived at home with family and attended adult daycare at [employer]. Pt is a poor historian and no family present. Per chart, pt amb with RW vs no AD at baseline but required a w/c on day of admission. Pt is on home hospice. Pt currently with functional limitations due to the deficits listed below (see PT Problem List). On eval, pt required min assist bed mobility, min assist transfers, and min assist amb 20' with RW. Deficits noted in strength, balance, cognition, and activity tolerance. Pt will benefit from acute skilled PT to increase their independence and safety with mobility to allow discharge. If available, recommend PT services at [facility] adult daycare. PT to follow acutely.           If plan is discharge home, recommend the following: A lot of help with bathing/dressing/bathroom;A little help with walking and/or transfers;Assist for transportation;Help with stairs or ramp for entrance   Can travel by private vehicle        Equipment Recommendations None recommended by PT  Recommendations for Other Services       Functional Status Assessment Patient has had a recent decline in their functional status and/or demonstrates limited ability to make significant improvements in function in a reasonable and predictable amount of time     Precautions / Restrictions Precautions Precautions: Fall;Other (comment) Precaution Comments: foley      Mobility  Bed Mobility Overal bed mobility: Needs Assistance Bed Mobility: Supine to Sit, Sit to Supine     Supine to sit:  Min assist, HOB elevated, Used rails Sit to supine: Min assist, Used rails   General bed mobility comments: Assist primarily for initiation and sequencing.    Transfers Overall transfer level: Needs assistance Equipment used: Rolling walker (2 wheels) Transfers: Sit to/from Stand Sit to Stand: Min assist           General transfer comment: assist to power up    Ambulation/Gait Ambulation/Gait assistance: Min assist Gait Distance (Feet): 20 Feet Assistive device: Rolling walker (2 wheels) Gait Pattern/deviations: Step-through pattern, Decreased stride length, Narrow base of support, Trunk flexed Gait velocity: decreased     General Gait Details: Shoes donned for amb. Assist with RW management and balance. Pt initially agreeable to hallway amb. When therapist opened door, pt reached forward and closed door. When asked 'do you not want to walk in the hall?' pt stated 'nah.'  Stairs            Wheelchair Mobility     Tilt Bed    Modified Rankin (Stroke Patients Only)       Balance Overall balance assessment: Needs assistance Sitting-balance support: Feet supported, No upper extremity supported Sitting balance-Leahy Scale: Fair     Standing balance support: Bilateral upper extremity supported, During functional activity, Reliant on assistive device for balance Standing balance-Leahy Scale: Poor                               Pertinent Vitals/Pain Pain Assessment Pain Assessment: Faces Faces Pain Scale: No hurt    Home Living Family/patient  expects to be discharged to:: Private residence   Available Help at Discharge: Family               Additional Comments: no family present to provide history/PLOF info & pt unable. Per chart, pt lives with family, goes to adult daycare at [place], and is on home hospice.    Prior Function Prior Level of Function : Patient poor historian/Family not available             Mobility Comments: Per  chart, amb with RW vs no AD at baseline.       Extremity/Trunk Assessment   Upper Extremity Assessment Upper Extremity Assessment: Defer to OT evaluation    Lower Extremity Assessment Lower Extremity Assessment: Generalized weakness    Cervical / Trunk Assessment Cervical / Trunk Assessment: Kyphotic  Communication      Cognition Arousal: Alert Behavior During Therapy: WFL for tasks assessed/performed Overall Cognitive Status: No family/caregiver present to determine baseline cognitive functioning                                 General Comments: dementia at baseline. Oriented to self only.        General Comments General comments (skin integrity, edema, etc.): On entry, pt had removed [address] with SpO2 83% on RA. 1L O2 replaced with improvement to 90%.    Exercises     Assessment/Plan    PT Assessment Patient needs continued PT services  PT Problem List Decreased strength;Decreased balance;Decreased mobility;Decreased cognition;Decreased activity tolerance;Decreased safety awareness       PT Treatment Interventions Gait training;Functional mobility training;Balance training;Patient/family education;Therapeutic activities;Therapeutic exercise    PT Goals (Current goals can be found in the Care Plan section)  Acute Rehab PT Goals Patient Stated Goal: not stated PT Goal Formulation: Patient unable to participate in goal setting Time For Goal Achievement: 04/03/23 Potential to Achieve Goals: Fair    Frequency Min 1X/week     Co-evaluation               AM-PAC PT "6 Clicks" Mobility  Outcome Measure Help needed turning from your back to your side while in a flat bed without using bedrails?: A Little Help needed moving from lying on your back to sitting on the side of a flat bed without using bedrails?: A Little Help needed moving to and from a bed to a chair (including a wheelchair)?: A Little Help needed standing up from a chair using your arms  (e.g., wheelchair or bedside chair)?: A Little Help needed to walk in hospital room?: A Little Help needed climbing 3-5 steps with a railing? : A Lot 6 Click Score: 17    End of Session Equipment Utilized During Treatment: Gait belt;Oxygen Activity Tolerance: Patient tolerated treatment well Patient left: in bed;with call bell/phone within reach;with bed alarm set Nurse Communication: Mobility status PT Visit Diagnosis: Muscle weakness (generalized) (M62.81);Difficulty in walking, not elsewhere classified (R26.2)    Time: 9528-4132 PT Time Calculation (min) (ACUTE ONLY): 19 min   Charges:   PT Evaluation $PT Eval Moderate Complexity: 1 Mod   PT General Charges $$ ACUTE PT VISIT: 1 Visit         Ferd Glassing., PT  Office # [phone]   Ilda Foil 03/20/2023, 10:34 AM
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
## 2023-03-20 NOTE — Progress Notes (Signed)
  Progress Note   Patient: Alexander [name] NUU:[id number] DOB: 29-Aug-1932 DOA: 03/19/2023     1 DOS: the patient was seen and examined on 03/20/2023   Brief hospital course: No notes on file  Assessment and Plan:  89 year old with history of dementia, BPH, alcohol use, chronic Foley catheter, nonobstructive coronary artery disease, hypertensive heart disease, anxiety, currently on hospice care as an outpatient is brought to the emergency department for generalized weakness, confusion for the last 1 day.  Patient goes to [facility] during the day for adult daycare, prior to leaving to [facility] looked more tired than usual, needed wheelchair which is a change from baseline.  Family contacted the hospice nurse, who recommended patient to go to emergency department to rule out any urinary tract infection.  Workup in the emergency department ,patient was found to have urinary tract infection..  Urinary tract infection -Follow-up with urine cultures-negative to date - continue the Rocephin.  Encephalopathy metabolic -Treated underlying conditions and follow-up. -Considering patient's dementia, high risk for sundowning -Keep the patient on Seroquel at bedtime.  Left-sided weakness -CT head without contrast did not show any acute intracranial abnormality.  Severe debility -Involve physical therapy, Occupational Therapy-   Subjective: Patient is confused, unable to obtain any history from the patient.  Per nursing staff no overnight event  Physical Exam: Vitals:   03/19/23 2313 03/20/23 0100 03/20/23 0526 03/20/23 0816  BP:  (!) 113/54 (!) 92/53 (!) 101/52  Pulse:  88 74 95  Resp:  18 18 20   Temp:  98.8 F (37.1 C) (!) 97.5 F (36.4 C) 98.2 F (36.8 C)  TempSrc:  Oral  Oral  SpO2: (!) 88% 94% 96% 96%  Weight:      Height:       Gen: Awake, alert, chronically ill appearing.  HEENT: Hard of hearing, + hearing aid  CV: Regular, normal S1, S2, no murmurs  Resp: Normal WOB,  CTAB  Abd: Flat, normoactive, nontender MSK: Symmetric, no edema  Skin: No rashes or lesions to exposed skin  Neuro: Alert and interactive, attention intact. Oriented to self only.  Could not examine the motor or sensory   Family Communication: Called the daughter, went to voicemail  Disposition: Status is: Inpatient Remains inpatient appropriate because: UTI, encephalopathy  Planned Discharge Destination:  Home  Time spent: 40 minutes  Author: Susa Griffins, MD 03/20/2023 10:31 AM  For on call review [email or web address].

## 2023-03-20 NOTE — Progress Notes (Addendum)
Pt pleasant and oriented to self only. Intermittent episodes of pulling IV tubing and foley, redirectable at times. Pt also removed nasal cannula throughout the day. Currently o2 sat 95% on RA, pt refused to allow RN to place nasal cannula back on. Pt able to feed himself and is contact guard with walker while ambulating per occupational therapist. RN observed pt favoring left side throughout shift.   1500: Bilateral Mittens placed on pt due to removing peripheral IV line. Pt educated on rationale (needs reinforcement) oldest daughter came to bedside and is aware. Pt informed mittens can be taken off to eat meals and when redirectable.

## 2023-03-21 DIAGNOSIS — N390 Urinary tract infection, site not specified: Secondary | ICD-10-CM | POA: Diagnosis not present

## 2023-03-21 NOTE — Progress Notes (Signed)
Occupational Therapy Treatment Patient Details Name: Alexander [name] MRN: [id number] DOB: May 07, 1932 Today's Date: 03/21/2023   History of present illness Pt is an 89 y.o. male admitted 11/25 with dx of complicated UTI and AMS. PMH: Alzheimer's disease, anxiety, BPH, CAD, COPD, depression, GERD, HLD, chronic urinary retention with chronic foley catheter   OT comments  Pt very inconsistent with command following and demonstrates little initiation in mobility and ADLs. He consistently messes with lines/leads and is hard to redirect, decreased safety awareness as pt continues to make efforts to stand without assist and tangles himself in cords during transfers despite directional cues. No family present to provide PLOF or home setup. OT to continue to progress pt as able. DC plans remain appropriate for SNF.       If plan is discharge home, recommend the following:  A lot of help with walking and/or transfers;A lot of help with bathing/dressing/bathroom;Assistance with cooking/housework;Assistance with feeding;Direct supervision/assist for medications management;Assist for transportation;Help with stairs or ramp for entrance;Supervision due to cognitive status   Equipment Recommendations  Other (comment) (TBD pending home setup info)    Recommendations for Other Services      Precautions / Restrictions Precautions Precautions: Fall;Other (comment) Precaution Comments: foley Restrictions Weight Bearing Restrictions: No       Mobility Bed Mobility Overal bed mobility: Needs Assistance Bed Mobility: Supine to Sit, Sit to Supine     Supine to sit: Min assist, HOB elevated, Used rails Sit to supine: Max assist   General bed mobility comments: Pt not initiating return to bed, assisted him with return to supine    Transfers Overall transfer level: Needs assistance Equipment used: Rolling walker (2 wheels) Transfers: Sit to/from Stand, Bed to chair/wheelchair/BSC Sit to Stand:  Min assist     Step pivot transfers: Mod assist     General transfer comment: Pt continued to attempted stands without OT assist throughout session. Hard time sequencing pivot to recliner, pt twists to tangle himself in cords. Discussed with pt that if he cannot stay in the recliner then he would need to return to bed for safety. Mod A from recliner to bed step pivot     Balance Overall balance assessment: Needs assistance Sitting-balance support: Feet supported, No upper extremity supported Sitting balance-Leahy Scale: Fair Sitting balance - Comments: EOB   Standing balance support: Bilateral upper extremity supported, During functional activity, Reliant on assistive device for balance Standing balance-Leahy Scale: Poor                             ADL either performed or assessed with clinical judgement   ADL                       Lower Body Dressing: Maximal assistance;Sitting/lateral leans (don socks)                      Extremity/Trunk Assessment              Vision       Perception     Praxis      Cognition Arousal: Alert Behavior During Therapy: Flat affect Overall Cognitive Status: History of cognitive impairments - at baseline                                 General Comments: dementia at baseline.  Exercises      Shoulder Instructions       General Comments VSS on 1L    Pertinent Vitals/ Pain       Pain Assessment Pain Assessment: No/denies pain  Home Living                                          Prior Functioning/Environment              Frequency  Min 1X/week        Progress Toward Goals  OT Goals(current goals can now be found in the care plan section)  Progress towards OT goals: Not progressing toward goals - comment (limited by cognition)  Acute Rehab OT Goals OT Goal Formulation: With patient Time For Goal Achievement: 04/03/23 Potential to Achieve  Goals: Fair  Plan      Co-evaluation                 AM-PAC OT "6 Clicks" Daily Activity     Outcome Measure   Help from another person eating meals?: A Little Help from another person taking care of personal grooming?: A Lot Help from another person toileting, which includes using toliet, bedpan, or urinal?: A Lot Help from another person bathing (including washing, rinsing, drying)?: A Lot Help from another person to put on and taking off regular upper body clothing?: A Lot Help from another person to put on and taking off regular lower body clothing?: A Lot 6 Click Score: 13    End of Session Equipment Utilized During Treatment: Gait belt;Rolling walker (2 wheels)  OT Visit Diagnosis: Unsteadiness on feet (R26.81);Other abnormalities of gait and mobility (R26.89);Muscle weakness (generalized) (M62.81);Other symptoms and signs involving cognitive function   Activity Tolerance Patient tolerated treatment well   Patient Left in bed;with call bell/phone within reach;with bed alarm set   Nurse Communication Mobility status        Time: 6967-8938 OT Time Calculation (min): 39 min  Charges: OT General Charges $OT Visit: 1 Visit OT Treatments $Therapeutic Activity: 38-52 mins  03/21/2023  Alexander [name], OTR/L  Acute Rehabilitation Services  Office: [phone]   Alexander [name] 03/21/2023, 5:34 PM

## 2023-03-21 NOTE — Progress Notes (Signed)
  Progress Note   Patient: Alexander [name] ZOX:[id number] DOB: [birth year] DOA: 03/19/2023     2 DOS: the patient was seen and examined on 03/21/2023   Brief hospital course: No notes on file  Assessment and Plan:  89 year old with history of dementia, BPH, alcohol use, chronic Foley catheter, nonobstructive coronary artery disease, hypertensive heart disease, anxiety, currently on hospice care as an outpatient is brought to the emergency department for generalized weakness, confusion for the last 1 day.  Patient goes to [facility] during the day for adult daycare, prior to leaving to [employer] looked more tired than usual, needed wheelchair which is a change from baseline.  Family contacted the hospice nurse, who recommended patient to go to emergency department to rule out any urinary tract infection.  Workup in the emergency department ,patient was found to have urinary tract infection..  Urinary tract infection -Follow-up with urine cultures-negative to date - continue the Rocephin.  Encephalopathy metabolic -Treated underlying conditions and follow-up. -Considering patient's dementia, high risk for sundowning -Keep the patient on Seroquel at bedtime. -Seems to be at his baseline  Left-sided weakness -CT head without contrast did not show any acute intracranial abnormality. -Moving all 4 extremities.  Difficult to examine strength as patient does not follow commands  Severe debility -Involve physical therapy, Occupational Therapy-   Subjective: Patient is more alert today.  Hard of hearing.  Answers simple questions appropriately off-and-on.  Knows his name.  He denies any chest pain, shortness of breath, abdominal pain. Full to eat breakfast persisted.  Discussed with nurse to assist. Physical Exam: Vitals:   03/20/23 1709 03/20/23 2057 03/21/23 0441 03/21/23 0740  BP: 129/81 (!) 111/55 128/69 139/72  Pulse: 76 66 71 68  Resp: 18 17  17   Temp: 98.2 F (36.8 C) 98.1 F  (36.7 C) 98.2 F (36.8 C) 98.3 F (36.8 C)  TempSrc: Oral Oral Oral Oral  SpO2:   98% 100%  Weight:      Height:       Gen: Awake, alert, chronically ill appearing.  HEENT: Hard of hearing, + hearing aid  CV: Regular, normal S1, S2, no murmurs  Resp: Normal WOB, CTAB  Abd: Flat, normoactive, nontender MSK: Symmetric, no edema  Skin: No rashes or lesions to exposed skin  Neuro: Alert and interactive, attention intact. Oriented to self only.  Moving all extremities.  Family Communication: Called the daughter, went to voicemail  Disposition: Status is: Inpatient Remains inpatient appropriate because: UTI, encephalopathy  Planned Discharge Destination:  Home  Time spent: 40 minutes  Author: Susa Griffins, MD 03/21/2023 9:04 AM  For on call review [email or web address].

## 2023-03-21 NOTE — Plan of Care (Signed)

## 2023-03-21 NOTE — TOC Progression Note (Addendum)
Transition of Care [facility]) - Progression Note    Patient Details  Name: Alexander [name] MRN: [id number] Date of Birth: 1933/02/07  [facility]) CM/SW Contact  Carley Hammed, LCSW Phone Number: 03/21/2023, 11:53 AM  Clinical Narrative:    CSW notified that dtr is stating she is unable to care for pt at home and is requesting rehab. CSW reviewed chart and noted pt has Dementia and is on Hospice. CSW spoke with dtr who states pt was in the hospital a month ago for a similar situation and had a large decline after that admission. At that time pt was denied for SNF at [facility] and dtr states she was advised that pt is "not rehab-able. Per dtr pt has declined to the point where he needs help with all ADL's and a lot of help getting around. Dtr states that she had to have back surgery and wants her and pt to get stronger prior to him coming home.  CSW noted that if pt does not show improvements while in SNF, then insurance will not continue to pay for it. Dtr would like for him to get as much PT as he can. Dtr states she will revoke hospice at this time, per dtr, he is only on Hospice for his weight loss. Pt does not qualify for Medicaid at this time, family can not afford for care to be brought into the home. CSW completed paperwork and sent out referrals. CSW notified MD, Hospice and care team of barriers and discussion with dtr. CSW requested for PT to see pt again and for MD to put in SNF order. CSW advised Hospice rep that family would be revoking in order to pursue rehab for pt. TOC will continue to follow.   1:45 CSW followed up with offering facilities, several declined after explaining situation, they were concerned they would not be able to get HTA auth as it was denied last time. Dtr asked about [facility] and [facility] are able to offer. Pt needs one more midnight to qualify for SNF through traditional Medicare. [facility] states they can admit pt tomorrow. Med necessity on the chart.  Olegario Messier or [name] will do admission tomorrow. Dtr and medical team aware. TOC will continue to follow. Expected Discharge Plan: Skilled Nursing Facility Barriers to Discharge: SNF Pending bed offer  Expected Discharge Plan and Services     Post Acute Care Choice: Skilled Nursing Facility Living arrangements for the past 2 months: Single Family Home                                       Social Determinants of Health (SDOH) Interventions SDOH Screenings   Food Insecurity: Patient Unable To Answer (03/20/2023)  Housing: Patient Unable To Answer (03/20/2023)  Transportation Needs: Patient Unable To Answer (03/20/2023)  Utilities: Patient Unable To Answer (03/20/2023)  Depression (PHQ2-9): Low Risk  (12/16/2020)  Financial Resource Strain: Low Risk  (09/07/2017)  Physical Activity: Sufficiently Active (09/07/2017)  Social Connections: Socially Integrated (09/07/2017)  Stress: No Stress Concern Present (09/07/2017)  Tobacco Use: Medium Risk (03/19/2023)    Readmission Risk Interventions    03/20/2023    2:33 PM 11/30/2022    9:58 AM  Readmission Risk Prevention Plan  Transportation Screening Complete Complete  PCP or Specialist Appt within 5-7 Days  Complete  Home Care Screening  Complete  Medication Review (RN CM)  Complete  Medication Review (  RN Care Manager) Referral to Pharmacy   PCP or Specialist appointment within 3-5 days of discharge Complete   HRI or Home Care Consult Complete   SW Recovery Care/Counseling Consult Complete   Palliative Care Screening Not Applicable   Skilled Nursing Facility Not Applicable

## 2023-03-21 NOTE — Progress Notes (Signed)
Mobility Specialist: Progress Note   03/21/23 1053  Mobility  Activity Ambulated with assistance in hallway  Level of Assistance Minimal assist, patient does 75% or more  Assistive Device Front wheel walker  Distance Ambulated (ft) 45 ft  Activity Response Tolerated well  Mobility Referral Yes  $Mobility charge 1 Mobility  Mobility Specialist Start Time (ACUTE ONLY) [id number]  Mobility Specialist Stop Time (ACUTE ONLY) 0954  Mobility Specialist Time Calculation (min) (ACUTE ONLY) 28 min    Pt ws agreeable to mobility session - received in bed. No complaints. MinA for bed mobility to scoot to EOB, minA for STS, minA for ambulation. Required verbal and tactile cues to correct posterior lean and for RW redirection. A little more confused when following directions to side step or pivot. Short slow steps with narrow BOS, no LOB. Returned to room without fault. Left in bed with all needs met, call bell in reach. Bed alarm on.   Maurene Capes Mobility Specialist Please contact via SecureChat or Rehab office at [phone]

## 2023-03-21 NOTE — [place] FL2 (Signed)
[place] MEDICAID FL2 LEVEL OF CARE FORM     IDENTIFICATION  Patient Name: Alexander [name] Birthdate: 07-11-1932 Sex: male Admission Date (Current Location): 03/19/2023  [facility] and [place] Number:  Producer, television/film/video and Address:  [facility], [address] [place], [place] [address]      Provider Number: [id number]  Attending Physician Name and Address:  Susa Griffins, MD  Relative Name and Phone Number:       Current Level of Care: Hospital Recommended Level of Care: Skilled Nursing Facility Prior Approval Number:    Date Approved/Denied:   PASRR Number: [id number]  Discharge Plan: SNF    Current Diagnoses: Patient Active Problem List   Diagnosis Date Noted   Complicated UTI (urinary tract infection) 03/19/2023   Acute cystitis without hematuria 02/19/2023   Pressure injury of skin 02/19/2023   Fever 02/08/2023   Acute UTI (urinary tract infection) 02/08/2023   Malnutrition of moderate degree 12/05/2022   Bacteremia due to Klebsiella pneumoniae 12/01/2022   Sepsis secondary to UTI (HCC) 11/29/2022   COPD exacerbation (HCC) 11/29/2020   Acute hypoxemic respiratory failure (HCC) 11/29/2020   Moderate dementia with behavioral disturbance 11/29/2020   COVID-19 04/11/2020   Syncope and collapse 04/11/2020   Right shoulder pain 02/19/2019   Hematochezia 01/03/2019   Bilateral hearing loss 10/06/2018   Dementia without behavioral disturbance (HCC) 09/02/2018   Acute respiratory failure with hypoxia (HCC) 09/02/2018   Rash 05/30/2018   Blood in ear canal, left 05/21/2018   Dermatitis 04/12/2018   Preventative health care 11/27/2017   Weight loss 08/27/2017   Abdominal discomfort, generalized 08/27/2017   Anemia 08/27/2017   Hyponatremia 08/27/2017   Chronic obstructive pulmonary disease (HCC) 06/05/2017   Chronic pansinusitis 06/05/2017   BPH with obstruction/lower urinary tract symptoms 01/30/2017   Gynecomastia, male  09/06/2016   Chest pain 05/21/2016   Hyperglycemia 05/09/2016   Depression 05/09/2016   Rhinitis, chronic 05/09/2016   Upper airway cough syndrome 03/18/2016   Hypertensive heart disease    Dark stools 11/14/2015   Screening for prostate cancer 05/06/2015   Thrush 05/06/2015   DOE (dyspnea on exertion) 07/31/2014   Constipation 01/27/2014   Glaucoma    Erectile dysfunction    Left ventricular hypertrophy 12/01/2012   GERD (gastroesophageal reflux disease) 10/28/2012   COPD, moderate (HCC) 10/22/2012   HLD (hyperlipidemia) 10/11/2009   Essential hypertension 10/11/2009   Cough, persistent 10/11/2009    Orientation RESPIRATION BLADDER Height & Weight     Self  O2 ([place] 2L) Incontinent, Indwelling catheter Weight: 143 lb 4.8 oz (65 kg) Height:  5\' 8"  (172.7 cm)  BEHAVIORAL SYMPTOMS/MOOD NEUROLOGICAL BOWEL NUTRITION STATUS      Incontinent Diet (See DC summary)  AMBULATORY STATUS COMMUNICATION OF NEEDS Skin   Limited Assist Verbally Normal                       Personal Care Assistance Level of Assistance  Bathing, Feeding, Dressing Bathing Assistance: Limited assistance Feeding assistance: Limited assistance Dressing Assistance: Limited assistance     Functional Limitations Info  Sight, Hearing, Speech Sight Info: Adequate Hearing Info: Adequate Speech Info: Adequate    SPECIAL CARE FACTORS FREQUENCY  PT (By licensed PT), OT (By licensed OT)     PT Frequency: 5x week OT Frequency: 5x week            Contractures Contractures Info: Not present    Additional Factors Info  Code Status,  Allergies, Psychotropic Code Status Info: Full Allergies Info: NKA Psychotropic Info: Escitalopram, Risperidone         Current Medications (03/21/2023):  This is the current hospital active medication list Current Facility-Administered Medications  Medication Dose Route Frequency Provider Last Rate Last Admin   acetaminophen (TYLENOL) tablet 1,000 mg  1,000 mg Oral  Q6H PRN Segars, Christiane Ha, MD       albuterol (PROVENTIL) (2.5 MG/3ML) 0.083% nebulizer solution 2.5 mg  2.5 mg Nebulization Q6H PRN Segars, Christiane Ha, MD       cefTRIAXone (ROCEPHIN) 2 g in sodium chloride 0.9 % 100 mL IVPB  2 g Intravenous Q24H Dolly Rias, MD   Stopped at 03/20/23 2212   Chlorhexidine Gluconate Cloth 2 % PADS 6 each  6 each Topical Daily Dolly Rias, MD   6 each at 03/21/23 0754   enoxaparin (LOVENOX) injection 40 mg  40 mg Subcutaneous Q24H Dolly Rias, MD   40 mg at 03/20/23 2137   escitalopram (LEXAPRO) tablet 20 mg  20 mg Oral QHS Dolly Rias, MD   20 mg at 03/20/23 2137   feeding supplement (ENSURE ENLIVE / ENSURE PLUS) liquid 237 mL  237 mL Oral TID BM Dolly Rias, MD   237 mL at 03/21/23 0754   hyoscyamine (LEVSIN SL) SL tablet 0.125 mg  0.125 mg Oral TID PRN Dolly Rias, MD       risperiDONE (RISPERDAL) tablet 0.5 mg  0.5 mg Oral QHS Segars, Christiane Ha, MD   0.5 mg at 03/20/23 2137   sodium chloride flush (NS) 0.9 % injection 3 mL  3 mL Intravenous Q12H Dolly Rias, MD   3 mL at 03/21/23 0755   traZODone (DESYREL) tablet 100 mg  100 mg Oral QHS Dolly Rias, MD   100 mg at 03/20/23 2137     Discharge Medications: Please see discharge summary for a list of discharge medications.  Relevant Imaging Results:  Relevant Lab Results:   Additional Information SS# [id number] [place]

## 2023-03-22 DIAGNOSIS — N390 Urinary tract infection, site not specified: Secondary | ICD-10-CM | POA: Diagnosis not present

## 2023-03-22 MED ORDER — CEFUROXIME AXETIL 500 MG PO TABS: 500.0000 mg | ORAL_TABLET | Freq: Two times a day (BID) | ORAL | Status: DC

## 2023-03-22 NOTE — TOC Progression Note (Signed)
Transition of Care [facility]) - Progression Note    Patient Details  Name: Alexander [name] MRN: [id number] Date of Birth: October 22, 1932  [facility] CM/SW Contact  [name], LCSW Phone Number: 03/22/2023, 10:25 AM  Clinical Narrative:    CSW spoke with [name] from [facility] who states she is having issues obtaining insurance authorization from HTA due to PT note from 11/26 stating that patient only requires therapy once weekly. [name] requesting patient be seen by PT with a new treatment note.   Expected Discharge Plan: Skilled Nursing Facility Barriers to Discharge: SNF Pending bed offer  Expected Discharge Plan and Services     Post Acute Care Choice: Skilled Nursing Facility Living arrangements for the past 2 months: Single Family Home Expected Discharge Date: 03/22/23                                     Social Determinants of Health (SDOH) Interventions SDOH Screenings   Food Insecurity: Patient Unable To Answer (03/20/2023)  Housing: Patient Unable To Answer (03/20/2023)  Transportation Needs: Patient Unable To Answer (03/20/2023)  Utilities: Patient Unable To Answer (03/20/2023)  Depression (PHQ2-9): Low Risk  (12/16/2020)  Financial Resource Strain: Low Risk  (09/07/2017)  Physical Activity: Sufficiently Active (09/07/2017)  Social Connections: Socially Integrated (09/07/2017)  Stress: No Stress Concern Present (09/07/2017)  Tobacco Use: Medium Risk (03/19/2023)    Readmission Risk Interventions    03/20/2023    2:33 PM 11/30/2022    9:58 AM  Readmission Risk Prevention Plan  Transportation Screening Complete Complete  PCP or Specialist Appt within 5-7 Days  Complete  Home Care Screening  Complete  Medication Review (RN CM)  Complete  Medication Review (RN Care Manager) Referral to Pharmacy   PCP or Specialist appointment within 3-5 days of discharge Complete   HRI or Home Care Consult Complete   SW Recovery Care/Counseling Consult  Complete   Palliative Care Screening Not Applicable   Skilled Nursing Facility Not Applicable

## 2023-03-22 NOTE — Plan of Care (Signed)
Care plan implemented
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
## 2023-03-23 DIAGNOSIS — N390 Urinary tract infection, site not specified: Secondary | ICD-10-CM | POA: Diagnosis not present

## 2023-03-23 NOTE — Progress Notes (Signed)
Physical Therapy Treatment  Patient Details Name: Alexander [name] MRN: [id number] DOB: 1932/07/03 Today's Date: 03/23/2023   History of Present Illness Pt is an 89 y.o. male admitted 11/25 with dx of complicated UTI and AMS. PMH: Alzheimer's disease, anxiety, BPH, CAD, COPD, depression, GERD, HLD, chronic urinary retention with chronic foley catheter    PT Comments  Pt progressing slowly towards physical therapy goals. Pt required increased cues and assist this session to initiate movement. Minimal verbalizations noted and pt intermittently following 1-step commands. Unable to progress to gait training this session due to decreased initiation.  Noted TOC note stating PT frequency is a barrier to obtaining insurance auth for SNF. To clarify, acute PT frequency is set for a minimum frequency for the week and does not reflect the max amount of PT "required" to meet goals. This patient will benefit from continued multidisciplinary rehab <3 hours/day at d/c to maximize functional independence, safety, and progress towards return to PLOF.    If plan is discharge home, recommend the following: A lot of help with bathing/dressing/bathroom;A little help with walking and/or transfers;Assist for transportation;Help with stairs or ramp for entrance   Can travel by private vehicle        Equipment Recommendations  None recommended by PT    Recommendations for Other Services       Precautions / Restrictions Precautions Precautions: Fall;Other (comment) Precaution Comments: foley Restrictions Weight Bearing Restrictions: No     Mobility  Bed Mobility Overal bed mobility: Needs Assistance Bed Mobility: Supine to Sit     Supine to sit: HOB elevated, Used rails, Mod assist     General bed mobility comments: Assist for LE advancement towards EOB. Pt initiated trunk elevation but required assist to complete. Bed pad utilized for scoot out fully to get feet on the floor.     Transfers Overall transfer level: Needs assistance Equipment used: Rolling walker (2 wheels) Transfers: Sit to/from Stand, Bed to chair/wheelchair/BSC Sit to Stand: Min assist   Step pivot transfers: Mod assist       General transfer comment: Assist to power up to full stand and gain/maintain standing balance. Pt with poor initiation and motor planning. Requires heavy cues and assist at the hips to guide direction of movement. Pt required consistent tactile stimulation at the LE for pt to initiate step forward. At times, standing statically and not moving at all for over a minute.    Ambulation/Gait               General Gait Details: Unable to progress to gait training at this time.   Stairs             Wheelchair Mobility     Tilt Bed    Modified Rankin (Stroke Patients Only)       Balance Overall balance assessment: Needs assistance Sitting-balance support: Feet supported, No upper extremity supported Sitting balance-Leahy Scale: Fair Sitting balance - Comments: EOB   Standing balance support: Bilateral upper extremity supported, During functional activity, Reliant on assistive device for balance Standing balance-Leahy Scale: Poor Standing balance comment: poor safety awareness, reliant on RW                            Cognition Arousal: Alert Behavior During Therapy: Flat affect Overall Cognitive Status: History of cognitive impairments - at baseline  General Comments: Dementia at baseline. Inconsistently following one step-commands. Unable to follow 2-step commands.        Exercises      General Comments        Pertinent Vitals/Pain Pain Assessment Pain Assessment: Faces Faces Pain Scale: No hurt Pain Intervention(s): Monitored during session    Home Living                          Prior Function            PT Goals (current goals can now be found in the care  plan section) Acute Rehab PT Goals Patient Stated Goal: not stated PT Goal Formulation: Patient unable to participate in goal setting Time For Goal Achievement: 04/03/23 Potential to Achieve Goals: Fair Progress towards PT goals: Progressing toward goals    Frequency    Min 2X/week      PT Plan      Co-evaluation              AM-PAC PT "6 Clicks" Mobility   Outcome Measure  Help needed turning from your back to your side while in a flat bed without using bedrails?: A Little Help needed moving from lying on your back to sitting on the side of a flat bed without using bedrails?: A Little Help needed moving to and from a bed to a chair (including a wheelchair)?: A Little Help needed standing up from a chair using your arms (e.g., wheelchair or bedside chair)?: A Little Help needed to walk in hospital room?: Total Help needed climbing 3-5 steps with a railing? : Total 6 Click Score: 14    End of Session Equipment Utilized During Treatment: Gait belt;Oxygen Activity Tolerance: Patient tolerated treatment well Patient left: in bed;with call bell/phone within reach;with bed alarm set Nurse Communication: Mobility status PT Visit Diagnosis: Muscle weakness (generalized) (M62.81);Difficulty in walking, not elsewhere classified (R26.2)     Time: 1610-9604 PT Time Calculation (min) (ACUTE ONLY): 30 min  Charges:    $Gait Training: 23-37 mins PT General Charges $$ ACUTE PT VISIT: 1 Visit                    Alexander [name], PT, DPT Acute Rehabilitation Services Secure Chat Preferred Office: [phone]    Alexander [name] 03/23/2023, 10:39 AM

## 2023-03-23 NOTE — Plan of Care (Signed)

## 2023-03-23 NOTE — Progress Notes (Signed)
  Progress Note   Patient: Alexander [name] [id number] DOB: March 08, 1933 DOA: 03/19/2023     4 DOS: the patient was seen and examined on 03/23/2023   Brief hospital course: No notes on file  Assessment and Plan:  89 year old with history of dementia, BPH, alcohol use, chronic Foley catheter, nonobstructive coronary artery disease, hypertensive heart disease, anxiety, currently on hospice care as an outpatient is brought to the emergency department for generalized weakness, confusion for the last 1 day.  Patient goes to [facility] during the day for adult daycare, prior to leaving to [facility] looked more tired than usual, needed wheelchair which is a change from baseline.  Family contacted the hospice nurse, who recommended patient to go to emergency department to rule out any urinary tract infection.  Workup in the emergency department ,patient was found to have urinary tract infection..  Cultures came back to be negative.  Patient's mental status is at his baseline.  Initially family wanted him to go to SNF, no accepting facility due to his dementia.  Family is agreeable to take patient home with the hospice.  Urinary tract infection -Follow-up with urine cultures-negative to date - continue the Rocephin.  Encephalopathy metabolic -Treated underlying conditions and follow-up. -Considering patient's dementia, high risk for sundowning -Keep the patient on Seroquel at bedtime. -Seems to be at his baseline  Left-sided weakness -CT head without contrast did not show any acute intracranial abnormality. -Moving all 4 extremities.  Difficult to examine strength as patient does not follow commands  Severe debility -Involve physical therapy, Occupational Therapy-   Subjective: Patient is more alert today.  Hard of hearing.  Answers simple questions appropriately off-and-on.  Knows his name.  He denies any chest pain, shortness of breath, abdominal pain. Full to eat breakfast persisted.   Discussed with nurse to assist. Physical Exam: Vitals:   03/22/23 1705 03/22/23 2306 03/23/23 0632 03/23/23 0723  BP: (!) 122/101 126/67 122/66 110/72  Pulse: 91 99 69 74  Resp: 18 18 17 18   Temp: 98.4 F (36.9 C) 97.9 F (36.6 C) 98 F (36.7 C) 98 F (36.7 C)  TempSrc:      SpO2: 100% 96% 98%   Weight:      Height:       Gen: Awake, alert, chronically ill appearing.  HEENT: Hard of hearing, + hearing aid  CV: Regular, normal S1, S2, no murmurs  Resp: Normal WOB, CTAB  Abd: Flat, normoactive, nontender MSK: Symmetric, no edema  Skin: No rashes or lesions to exposed skin  Neuro: Alert and interactive, attention intact. Oriented to self only.  Moving all extremities.  Family Communication: Called the daughter, went to voicemail  Disposition: Status is: Inpatient Remains inpatient appropriate because: UTI, encephalopathy  Planned Discharge Destination:  Home  Time spent: 40 minutes  Author: Susa Griffins, MD 03/23/2023 2:26 PM  For on call review [email or web address].

## 2023-03-23 NOTE — Progress Notes (Signed)
Patient's family called front desk to check if pt is ready for pick up.  Told the family will call them back in 30 min after checking with the MD and SW as there was no discharge order for home with hospice.  Current order from 11/28 is for SNF.  Reached out to ED SW and she advised to get updated discharged summary and order.  Reached out to MD, no new orders so far.Marland Kitchen

## 2023-03-23 NOTE — TOC Progression Note (Signed)
Transition of Care [facility]) - Progression Note    Patient Details  Name: Alexander [name] MRN: [id number] Date of Birth: October 25, 1932  [facility]) CM/SW Contact  Georgie Chard, [place] Phone Number: 03/23/2023, 6:52 PM  Clinical Narrative:    CSW spoke with nurse in regards to DC for patient, at this time family would like to come get patient. CSW advised nurse to get updated DC summary, as the patient is now going home with home hospice.   Expected Discharge Plan: Skilled Nursing Facility Barriers to Discharge: SNF Pending bed offer  Expected Discharge Plan and Services     Post Acute Care Choice: Skilled Nursing Facility Living arrangements for the past 2 months: Single Family Home Expected Discharge Date: 03/22/23                                     Social Determinants of Health (SDOH) Interventions SDOH Screenings   Food Insecurity: Patient Unable To Answer (03/20/2023)  Housing: Patient Unable To Answer (03/20/2023)  Transportation Needs: Patient Unable To Answer (03/20/2023)  Utilities: Patient Unable To Answer (03/20/2023)  Depression (PHQ2-9): Low Risk  (12/16/2020)  Financial Resource Strain: Low Risk  (09/07/2017)  Physical Activity: Sufficiently Active (09/07/2017)  Social Connections: Socially Integrated (09/07/2017)  Stress: No Stress Concern Present (09/07/2017)  Tobacco Use: Medium Risk (03/19/2023)    Readmission Risk Interventions    03/20/2023    2:33 PM 11/30/2022    9:58 AM  Readmission Risk Prevention Plan  Transportation Screening Complete Complete  PCP or Specialist Appt within 5-7 Days  Complete  Home Care Screening  Complete  Medication Review (RN CM)  Complete  Medication Review (RN Care Manager) Referral to Pharmacy   PCP or Specialist appointment within 3-5 days of discharge Complete   HRI or Home Care Consult Complete   SW Recovery Care/Counseling Consult Complete   Palliative Care Screening Not Applicable   Skilled  Nursing Facility Not Applicable

## 2023-03-23 NOTE — Progress Notes (Signed)
Alexander [name] is agitated and combative. He refuses his medication. MD notified

## 2023-03-23 NOTE — TOC Progression Note (Addendum)
Transition of Care [facility]) - Progression Note    Patient Details  Name: Alexander [name] MRN: [id number] Date of Birth: Apr 02, 1933  [facility]) CM/SW Contact  Carley Hammed, LCSW Phone Number: 03/23/2023, 12:57 PM  Clinical Narrative:    CSW was notified that at this time [facility] is rescinding the bed offer. SNF is no longer being considered as a DC option. CSW spoke with dtr who notes understanding. Dtr and CSW discussed setting pt up with Hospice again. Dtr notes she was not getting a lot of support with [facility] and agreeable to trying a different company. Medicare. Gov discussed and CSW noted that referrals can be sent and reps can reach out to discuss options with dtr and simplify the process. [facility], [facility], [facility], and [facility] received referral. Dtr to follow up with choice of provider. TOC will continue to follow.   3:00 CSW spoke with dtr who noted she had spoken with several representatives. Dtr stated she would like to discuss with her brothers and would call CSW right back. After an hour CSW called again, no answer, left VM. CSW waited 15 minutes and attempted again. Dtr notes she would like to proceed with [facility]. States sons will come pick him up. CSW notified Hospice rep. TOC will sign off at this time. Expected Discharge Plan: Skilled Nursing Facility Barriers to Discharge: SNF Pending bed offer  Expected Discharge Plan and Services     Post Acute Care Choice: Skilled Nursing Facility Living arrangements for the past 2 months: Single Family Home Expected Discharge Date: 03/22/23                                     Social Determinants of Health (SDOH) Interventions SDOH Screenings   Food Insecurity: Patient Unable To Answer (03/20/2023)  Housing: Patient Unable To Answer (03/20/2023)  Transportation Needs: Patient Unable To Answer (03/20/2023)  Utilities: Patient Unable To Answer (03/20/2023)  Depression (PHQ2-9): Low Risk   (12/16/2020)  Financial Resource Strain: Low Risk  (09/07/2017)  Physical Activity: Sufficiently Active (09/07/2017)  Social Connections: Socially Integrated (09/07/2017)  Stress: No Stress Concern Present (09/07/2017)  Tobacco Use: Medium Risk (03/19/2023)    Readmission Risk Interventions    03/20/2023    2:33 PM 11/30/2022    9:58 AM  Readmission Risk Prevention Plan  Transportation Screening Complete Complete  PCP or Specialist Appt within 5-7 Days  Complete  Home Care Screening  Complete  Medication Review (RN CM)  Complete  Medication Review (RN Care Manager) Referral to Pharmacy   PCP or Specialist appointment within 3-5 days of discharge Complete   HRI or Home Care Consult Complete   SW Recovery Care/Counseling Consult Complete   Palliative Care Screening Not Applicable   Skilled Nursing Facility Not Applicable

## 2023-03-24 DIAGNOSIS — N390 Urinary tract infection, site not specified: Secondary | ICD-10-CM | POA: Diagnosis not present

## 2023-03-24 LAB — CULTURE, BLOOD (ROUTINE X 2)
Culture: NO GROWTH
Culture: NO GROWTH
Special Requests: ADEQUATE

## 2023-03-24 MED ORDER — CEFUROXIME AXETIL 500 MG PO TABS: 500.0000 mg | ORAL_TABLET | Freq: Two times a day (BID) | ORAL | 0 refills | Status: AC

## 2023-03-24 NOTE — Plan of Care (Signed)
  Problem: Education: Goal: Knowledge of General Education information will improve Description: Including pain rating scale, medication(s)/side effects and non-pharmacologic comfort measures Outcome: Adequate for Discharge   Problem: Health Behavior/Discharge Planning: Goal: Ability to manage health-related needs will improve Outcome: Adequate for Discharge   Problem: Clinical Measurements: Goal: Ability to maintain clinical measurements within normal limits will improve Outcome: Adequate for Discharge Goal: Will remain free from infection Outcome: Adequate for Discharge Goal: Diagnostic test results will improve Outcome: Adequate for Discharge Goal: Respiratory complications will improve Outcome: Adequate for Discharge Goal: Cardiovascular complication will be avoided Outcome: Adequate for Discharge   Problem: Activity: Goal: Risk for activity intolerance will decrease Outcome: Adequate for Discharge   Problem: Nutrition: Goal: Adequate nutrition will be maintained Outcome: Adequate for Discharge   Problem: Coping: Goal: Level of anxiety will decrease Outcome: Adequate for Discharge   Problem: Elimination: Goal: Will not experience complications related to bowel motility Outcome: Adequate for Discharge Goal: Will not experience complications related to urinary retention Outcome: Adequate for Discharge   Problem: Pain Management: Goal: General experience of comfort will improve Outcome: Adequate for Discharge   Problem: Safety: Goal: Ability to remain free from injury will improve Outcome: Adequate for Discharge   Problem: Skin Integrity: Goal: Risk for impaired skin integrity will decrease Outcome: Adequate for Discharge   Problem: Education: Goal: Knowledge of General Education information will improve Description: Including pain rating scale, medication(s)/side effects and non-pharmacologic comfort measures Outcome: Adequate for Discharge   Problem: Health  Behavior/Discharge Planning: Goal: Ability to manage health-related needs will improve Outcome: Adequate for Discharge   Problem: Clinical Measurements: Goal: Ability to maintain clinical measurements within normal limits will improve Outcome: Adequate for Discharge Goal: Will remain free from infection Outcome: Adequate for Discharge Goal: Diagnostic test results will improve Outcome: Adequate for Discharge Goal: Respiratory complications will improve Outcome: Adequate for Discharge Goal: Cardiovascular complication will be avoided Outcome: Adequate for Discharge   Problem: Activity: Goal: Risk for activity intolerance will decrease Outcome: Adequate for Discharge   Problem: Nutrition: Goal: Adequate nutrition will be maintained Outcome: Adequate for Discharge   Problem: Coping: Goal: Level of anxiety will decrease Outcome: Adequate for Discharge   Problem: Elimination: Goal: Will not experience complications related to bowel motility Outcome: Adequate for Discharge Goal: Will not experience complications related to urinary retention Outcome: Adequate for Discharge   Problem: Pain Management: Goal: General experience of comfort will improve Outcome: Adequate for Discharge   Problem: Safety: Goal: Ability to remain free from injury will improve Outcome: Adequate for Discharge   Problem: Skin Integrity: Goal: Risk for impaired skin integrity will decrease Outcome: Adequate for Discharge

## 2023-03-24 NOTE — Progress Notes (Signed)
[facility] Liaison Note   Referral received for patient/family interest in hospice at home. [facility] liaison spoke with patient's daughter Alexander [name] to confirm. Interest confirmed.   Plan is to discharge home today.   DME is needed, however daughter is okay with discharge home and DME being delivered later  Please send comfort medications/prescriptions at discharge with patient.   Please call with any questions or concerns. Thank you  Alexander Stall, LCSW [facility] Liaison [phone]

## 2023-03-24 NOTE — Plan of Care (Signed)
Care plan implemented

## 2023-03-24 NOTE — Progress Notes (Addendum)
DISCHARGE NOTE HOME [facility] to be discharged Home per MD order. Discussed prescriptions and follow up appointments with the patient. Prescriptions given to patient; medication list explained in detail. Patient verbalized understanding.  Skin clean, dry and intact without evidence of skin break down, no evidence of skin tears noted. IV catheter discontinued intact. Site without signs and symptoms of complications. Dressing and pressure applied. Pt denies pain at the site currently. No complaints noted.  Patient free wounds. Pt does have chronic foley which was changed in ER. Changed to leg bag per daughter's request.   An After Visit Summary (AVS) was printed and given to the daughter. Patient escorted via wheelchair, and discharged home via private auto.  Irwin Brakeman, RN
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
## 2023-03-24 NOTE — TOC Transition Note (Addendum)
Transition of Care [facility]) - CM/SW Discharge Note   Patient Details  Name: Alexander [name] MRN: [id number] Date of Birth: [birth year]  Transition of Care [facility]) CM/SW Contact:  Ronny Bacon, RN Phone Number: 03/24/2023, 9:48 AM   Clinical Narrative:  Patient is being discharged home with hospice today. Spoke with Eber Jones with [facility], aware patient is being discharged today. She reached out to family to see what DME is needed and she will get it ordered and arrange delivery. Family agreeable to accept patient at home and will wait for DME to be delivered when available. Floor nurse aware patient is ok to discharge home   1020 secure message received from floor nurse that daughter changed her mind and now wants to stay with [facility]. Eber Jones with [facility] made aware and secure message sent to [facility] to inform of same. Daughter and her husband will come to pick up patient.    Final next level of care: Home w Hospice Care Barriers to Discharge: No Barriers Identified   Patient Goals and CMS Choice CMS Medicare.gov Compare Post Acute Care list provided to:: Patient Represenative (must comment) [name]) Choice offered to / list presented to : Adult Children  Discharge Placement                         Discharge Plan and Services Additional resources added to the After Visit Summary for       Post Acute Care Choice: Skilled Nursing Facility                               Social Determinants of Health (SDOH) Interventions SDOH Screenings   Food Insecurity: Patient Unable To Answer (03/20/2023)  Housing: Patient Unable To Answer (03/20/2023)  Transportation Needs: Patient Unable To Answer (03/20/2023)  Utilities: Patient Unable To Answer (03/20/2023)  Depression (PHQ2-9): Low Risk  (12/16/2020)  Financial Resource Strain: Low Risk  (09/07/2017)  Physical Activity: Sufficiently Active (09/07/2017)  Social Connections: Socially  Integrated (09/07/2017)  Stress: No Stress Concern Present (09/07/2017)  Tobacco Use: Medium Risk (03/19/2023)     Readmission Risk Interventions    03/20/2023    2:33 PM 11/30/2022    9:58 AM  Readmission Risk Prevention Plan  Transportation Screening Complete Complete  PCP or Specialist Appt within 5-7 Days  Complete  Home Care Screening  Complete  Medication Review (RN CM)  Complete  Medication Review (RN Care Manager) Referral to Pharmacy   PCP or Specialist appointment within 3-5 days of discharge Complete   HRI or Home Care Consult Complete   SW Recovery Care/Counseling Consult Complete   Palliative Care Screening Not Applicable   Skilled Nursing Facility Not Applicable

## 2023-03-26 DIAGNOSIS — I10 Essential (primary) hypertension: Secondary | ICD-10-CM | POA: Diagnosis not present

## 2023-03-26 DIAGNOSIS — F02818 Dementia in other diseases classified elsewhere, unspecified severity, with other behavioral disturbance: Secondary | ICD-10-CM | POA: Diagnosis not present

## 2023-03-26 DIAGNOSIS — N320 Bladder-neck obstruction: Secondary | ICD-10-CM | POA: Diagnosis not present

## 2023-03-26 DIAGNOSIS — G9341 Metabolic encephalopathy: Secondary | ICD-10-CM | POA: Diagnosis not present

## 2023-03-26 DIAGNOSIS — D649 Anemia, unspecified: Secondary | ICD-10-CM | POA: Diagnosis not present

## 2023-03-26 DIAGNOSIS — R531 Weakness: Secondary | ICD-10-CM | POA: Diagnosis not present

## 2023-03-26 DIAGNOSIS — Z978 Presence of other specified devices: Secondary | ICD-10-CM | POA: Diagnosis not present

## 2023-03-26 DIAGNOSIS — G301 Alzheimer's disease with late onset: Secondary | ICD-10-CM | POA: Diagnosis not present

## 2023-03-26 DIAGNOSIS — N390 Urinary tract infection, site not specified: Secondary | ICD-10-CM | POA: Diagnosis not present

## 2023-05-18 DIAGNOSIS — M25562 Pain in left knee: Secondary | ICD-10-CM | POA: Diagnosis not present

## 2023-05-18 DIAGNOSIS — M25552 Pain in left hip: Secondary | ICD-10-CM | POA: Diagnosis not present

## 2023-05-25 ENCOUNTER — Encounter (HOSPITAL_COMMUNITY): Payer: Self-pay | Admitting: Emergency Medicine

## 2023-05-25 ENCOUNTER — Inpatient Hospital Stay (HOSPITAL_COMMUNITY)
Admission: EM | Admit: 2023-05-25 | Discharge: 2023-06-02 | DRG: 698 | Disposition: A | Discharge status: Hospice | Attending: Internal Medicine | Admitting: Internal Medicine

## 2023-05-25 ENCOUNTER — Other Ambulatory Visit: Payer: Self-pay

## 2023-05-25 DIAGNOSIS — H9190 Unspecified hearing loss, unspecified ear: Secondary | ICD-10-CM | POA: Diagnosis present

## 2023-05-25 DIAGNOSIS — A419 Sepsis, unspecified organism: Secondary | ICD-10-CM | POA: Diagnosis present

## 2023-05-25 DIAGNOSIS — J4489 Other specified chronic obstructive pulmonary disease: Secondary | ICD-10-CM | POA: Diagnosis present

## 2023-05-25 DIAGNOSIS — I2510 Atherosclerotic heart disease of native coronary artery without angina pectoris: Secondary | ICD-10-CM | POA: Diagnosis present

## 2023-05-25 DIAGNOSIS — N39498 Other specified urinary incontinence: Secondary | ICD-10-CM | POA: Diagnosis present

## 2023-05-25 DIAGNOSIS — N401 Enlarged prostate with lower urinary tract symptoms: Secondary | ICD-10-CM | POA: Diagnosis present

## 2023-05-25 DIAGNOSIS — F03B18 Unspecified dementia, moderate, with other behavioral disturbance: Secondary | ICD-10-CM | POA: Diagnosis present

## 2023-05-25 DIAGNOSIS — T839XXA Unspecified complication of genitourinary prosthetic device, implant and graft, initial encounter: Secondary | ICD-10-CM | POA: Diagnosis present

## 2023-05-25 DIAGNOSIS — Z800 Family history of malignant neoplasm of digestive organs: Secondary | ICD-10-CM

## 2023-05-25 DIAGNOSIS — Z823 Family history of stroke: Secondary | ICD-10-CM

## 2023-05-25 DIAGNOSIS — Y846 Urinary catheterization as the cause of abnormal reaction of the patient, or of later complication, without mention of misadventure at the time of the procedure: Secondary | ICD-10-CM | POA: Diagnosis present

## 2023-05-25 DIAGNOSIS — B952 Enterococcus as the cause of diseases classified elsewhere: Secondary | ICD-10-CM | POA: Insufficient documentation

## 2023-05-25 DIAGNOSIS — I110 Hypertensive heart disease with heart failure: Secondary | ICD-10-CM | POA: Diagnosis present

## 2023-05-25 DIAGNOSIS — Z8249 Family history of ischemic heart disease and other diseases of the circulatory system: Secondary | ICD-10-CM

## 2023-05-25 DIAGNOSIS — R410 Disorientation, unspecified: Secondary | ICD-10-CM

## 2023-05-25 DIAGNOSIS — D638 Anemia in other chronic diseases classified elsewhere: Secondary | ICD-10-CM | POA: Diagnosis present

## 2023-05-25 DIAGNOSIS — Z79899 Other long term (current) drug therapy: Secondary | ICD-10-CM

## 2023-05-25 DIAGNOSIS — E785 Hyperlipidemia, unspecified: Secondary | ICD-10-CM | POA: Diagnosis present

## 2023-05-25 DIAGNOSIS — T83091A Other mechanical complication of indwelling urethral catheter, initial encounter: Principal | ICD-10-CM | POA: Diagnosis present

## 2023-05-25 DIAGNOSIS — I5032 Chronic diastolic (congestive) heart failure: Secondary | ICD-10-CM | POA: Diagnosis present

## 2023-05-25 DIAGNOSIS — Z9181 History of falling: Secondary | ICD-10-CM

## 2023-05-25 DIAGNOSIS — D649 Anemia, unspecified: Secondary | ICD-10-CM | POA: Diagnosis present

## 2023-05-25 DIAGNOSIS — G309 Alzheimer's disease, unspecified: Secondary | ICD-10-CM | POA: Diagnosis present

## 2023-05-25 DIAGNOSIS — K219 Gastro-esophageal reflux disease without esophagitis: Secondary | ICD-10-CM | POA: Diagnosis present

## 2023-05-25 DIAGNOSIS — Z87891 Personal history of nicotine dependence: Secondary | ICD-10-CM

## 2023-05-25 DIAGNOSIS — F02C2 Dementia in other diseases classified elsewhere, severe, with psychotic disturbance: Secondary | ICD-10-CM | POA: Diagnosis present

## 2023-05-25 DIAGNOSIS — E875 Hyperkalemia: Secondary | ICD-10-CM | POA: Diagnosis present

## 2023-05-25 DIAGNOSIS — G9341 Metabolic encephalopathy: Secondary | ICD-10-CM | POA: Diagnosis present

## 2023-05-25 DIAGNOSIS — A4181 Sepsis due to Enterococcus: Secondary | ICD-10-CM | POA: Diagnosis present

## 2023-05-25 DIAGNOSIS — N390 Urinary tract infection, site not specified: Principal | ICD-10-CM | POA: Diagnosis present

## 2023-05-25 DIAGNOSIS — Z9049 Acquired absence of other specified parts of digestive tract: Secondary | ICD-10-CM

## 2023-05-25 DIAGNOSIS — Z8042 Family history of malignant neoplasm of prostate: Secondary | ICD-10-CM

## 2023-05-25 DIAGNOSIS — R319 Hematuria, unspecified: Secondary | ICD-10-CM | POA: Diagnosis not present

## 2023-05-25 DIAGNOSIS — F02C18 Dementia in other diseases classified elsewhere, severe, with other behavioral disturbance: Secondary | ICD-10-CM | POA: Diagnosis present

## 2023-05-25 DIAGNOSIS — Y738 Miscellaneous gastroenterology and urology devices associated with adverse incidents, not elsewhere classified: Secondary | ICD-10-CM | POA: Diagnosis present

## 2023-05-25 DIAGNOSIS — T83511A Infection and inflammatory reaction due to indwelling urethral catheter, initial encounter: Secondary | ICD-10-CM | POA: Diagnosis present

## 2023-05-25 DIAGNOSIS — Z1152 Encounter for screening for COVID-19: Secondary | ICD-10-CM

## 2023-05-25 DIAGNOSIS — R7881 Bacteremia: Secondary | ICD-10-CM | POA: Insufficient documentation

## 2023-05-25 DIAGNOSIS — Z8349 Family history of other endocrine, nutritional and metabolic diseases: Secondary | ICD-10-CM

## 2023-05-25 DIAGNOSIS — I10 Essential (primary) hypertension: Secondary | ICD-10-CM | POA: Diagnosis not present

## 2023-05-25 LAB — BASIC METABOLIC PANEL
Anion gap: 10 (ref 5–15)
BUN: 16 mg/dL (ref 8–23)
CO2: 29 mmol/L (ref 22–32)
Calcium: 9.6 mg/dL (ref 8.9–10.3)
Chloride: 101 mmol/L (ref 98–111)
Creatinine, Ser: 0.77 mg/dL (ref 0.61–1.24)
GFR, Estimated: >60 mL/min (ref >60)
Glucose, Bld: 119 mg/dL — ABNORMAL HIGH (ref 70–99)
Potassium: 4.3 mmol/L (ref 3.5–5.1)
Sodium: 140 mmol/L (ref 135–145)

## 2023-05-25 LAB — CBC
HCT: 35.3 % — ABNORMAL LOW (ref 39.0–52.0)
Hemoglobin: 11 g/dL — ABNORMAL LOW (ref 13.0–17.0)
MCH: 30.4 pg (ref 26.0–34.0)
MCHC: 31.2 g/dL (ref 30.0–36.0)
MCV: 97.5 fL (ref 80.0–100.0)
Platelets: 279 10*3/uL (ref 150–400)
RBC: 3.62 MIL/uL — ABNORMAL LOW (ref 4.22–5.81)
RDW: 13.2 % (ref 11.5–15.5)
WBC: 10.3 10*3/uL (ref 4.0–10.5)
nRBC: 0 % (ref 0.0–0.2)

## 2023-05-25 NOTE — ED Provider Notes (Signed)
[facility]ENC[facility] DEPT [facility] Emergency Department Provider Note MRN:  [id number]  Arrival date & time: 05/26/23     Chief Complaint   Hematuria   History of Present Illness   Alexander [name] is a 89 y.o. year-old male presents to the ED with chief complaint of catheter problem. He is brought in by family, who state that he hasn't made any urine since have his catheter exchanged earlier today.  They do report that he has had some blood in the catheter.  Patient complains of lower abdominal pain.  Family also reports that he has had diarrhea today.  History provided by patient.   Review of Systems  Pertinent positive and negative review of systems noted in HPI.    Physical Exam   Vitals:   05/26/23 0437 05/26/23 0500  BP:  (!) 116/90  Pulse:  (!) 132  Resp:  17  Temp: (!) 103.3 F (39.6 C)   SpO2:  94%    CONSTITUTIONAL:  non toxic-appearing, NAD NEURO:  Alert, demented EYES:  eyes equal and reactive ENT/NECK:  Supple, no stridor  CARDIO:  normal rate, regular rhythm, appears well-perfused  PULM:  No respiratory distress, CTAB GI/GU:  non-distended, catheter in place MSK/SPINE:  No gross deformities, no edema, moves all extremities  SKIN:  no rash, atraumatic   *Additional and/or pertinent findings included in MDM below  Diagnostic and Interventional Summary    EKG Interpretation Date/Time:    Ventricular Rate:    PR Interval:    QRS Duration:    QT Interval:    QTC Calculation:   R Axis:      Text Interpretation:         Labs Reviewed  CBC - Abnormal; Notable for the following components:      Result Value   RBC 3.62 (*)    Hemoglobin 11.0 (*)    HCT 35.3 (*)    All other components within normal limits  BASIC METABOLIC PANEL - Abnormal; Notable for the following components:   Glucose, Bld 119 (*)    All other components within normal limits  COMPREHENSIVE METABOLIC PANEL - Abnormal; Notable for the following components:   Potassium 5.7  (*)    Total Protein 5.2 (*)    Albumin 3.1 (*)    All other components within normal limits  URINALYSIS, W/ REFLEX TO CULTURE (INFECTION SUSPECTED) - Abnormal; Notable for the following components:   APPearance CLOUDY (*)    Hgb urine dipstick LARGE (*)    Protein, ur 30 (*)    Leukocytes,Ua MODERATE (*)    Bacteria, UA MANY (*)    All other components within normal limits  PROTIME-INR - Abnormal; Notable for the following components:   Prothrombin Time 15.8 (*)    All other components within normal limits  CULTURE, BLOOD (SINGLE)  RESP PANEL BY RT-PCR (RSV, FLU A&B, COVID)  RVPGX2  URINE CULTURE  APTT  URINALYSIS, ROUTINE W REFLEX MICROSCOPIC  I-STAT CG4 LACTIC ACID, ED    DG Chest Port 1 View  Final Result      Medications  lactated ringers infusion (has no administration in time range)  lactated ringers bolus 1,000 mL (1,000 mLs Intravenous New Bag/Given 05/26/23 0558)  vancomycin (VANCOCIN) IVPB 1000 mg/200 mL premix (1,000 mg Intravenous New Bag/Given 05/26/23 0539)  acetaminophen (OFIRMEV) IV 1,000 mg (has no administration in time range)  ceFEPIme (MAXIPIME) 2 g in sodium chloride 0.9 % 100 mL IVPB (0 g Intravenous Stopped 05/26/23 0601)  metroNIDAZOLE (FLAGYL) IVPB 500 mg (0 mg Intravenous Stopped 05/26/23 0559)     Procedures  /  Critical Care .Critical Care  Performed by: Roxy Horseman, PA-C Authorized by: Roxy Horseman, PA-C   Critical care provider statement:    Critical care time (minutes):  55   Critical care was necessary to treat or prevent imminent or life-threatening deterioration of the following conditions:  Sepsis   Critical care was time spent personally by me on the following activities:  Development of treatment plan with patient or surrogate, discussions with consultants, evaluation of patient's response to treatment, examination of patient, ordering and review of laboratory studies, ordering and review of radiographic studies, ordering and performing  treatments and interventions, pulse oximetry, re-evaluation of patient's condition and review of old charts BLADDER CATHETERIZATION  Date/Time: 05/26/2023 6:20 AM  Performed by: Roxy Horseman, PA-C Authorized by: Roxy Horseman, PA-C   Consent:    Consent obtained:  Verbal   Consent given by:  Guardian   Risks, benefits, and alternatives were discussed: yes     Risks discussed:  False passage, infection, urethral injury, incomplete procedure and pain   Alternatives discussed:  No treatment and referral Universal protocol:    Procedure explained and questions answered to patient or proxy's satisfaction: yes     Relevant documents present and verified: yes     Test results available: yes     Imaging studies available: yes     Required blood products, implants, devices, and special equipment available: yes     Site/side marked: yes     Immediately prior to procedure, a time out was called: yes     Patient identity confirmed:  Verbally with patient Pre-procedure details:    Procedure purpose:  Diagnostic Anesthesia:    Anesthesia method:  None Procedure details:    Provider performed due to:  Complicated insertion   Catheter insertion:  Indwelling   Catheter type:  Coude   Catheter size:  16 Fr   Bladder irrigation: no     Number of attempts:  1   Urine characteristics:  Bloody Post-procedure details:    Procedure completion:  Procedure terminated electively by provider Comments:     After a single attempt reaching resistance accompanied with pain without any urine output, I terminated the procedure and consulted with urology for assistance.   ED Course and Medical Decision Making  I have reviewed the triage vital signs, the nursing notes, and pertinent available records from the EMR.  Social Determinants Affecting Complexity of Care: Patient has no clinically significant social determinants affecting this chief complaint..   ED Course: Clinical Course as of 05/26/23  0622  Sat May 26, 2023  0622 Urinalysis, w/ Reflex to Culture (Infection Suspected) -Urine, Catheterized; Indwelling urinary catheter(!) Worrisome for UTI [RB]  0622 Potassium(!): 5.7 Initial tonight was 4.3, likely hemolyzed.  [RB]    Clinical Course User Index [RB] Roxy Horseman, PA-C    Medical Decision Making Patient BIB family over worsening confusion and hallucinations.  Concern for recurrent UTI. Patient has indwelling foley catheter.  Family says that this was exchanged today by hospice, but never drained any urine.  When patient arrived in the ED, bladder scan yielded ~380 ml.  We attempted to exchange the catheter. Immediately upon deflation of the balloon, blood poured out of the urethral meatus.  I was called to bedside.  I removed the catheter that had been placed earlier by hospice and patient had continued bleeding from the penis.  I  consulted with Dr. Bebe Shaggy, who recommended trying to place a new catheter.  I attempted this once and met resistance without getting any urine back.  I am concerned that patient had a false lumen created or other trauma from catheter insertion earlier today.  I consulted with urology, Dr. Gordan Payment, who is appreciated for consulting and was able to scope patient at bedside and place a new catheter.  See her consult note for details and recs.    While patient was here, he spiked a fever to 103 with associated tachycardia.  Sepsis labs were drawn and antibiotics started.  Amount and/or Complexity of Data Reviewed Labs: ordered. Radiology: ordered.  Risk Prescription drug management. Decision regarding hospitalization.         Consultants: I consulted with Dr. Gordan Payment, with urology, who is appreciated for placing catheter and consulting.  I consulted with Dr. Lazarus Salines, who is appreciated for accepting admission. He will sign patient out to the morning team.   Treatment and Plan: Patient's exam and diagnostic results are  concerning for urosepsis.  Feel that patient will need admission to the hospital for further treatment and evaluation.  Patient discussed with attending physician, Dr. Bebe Shaggy, who agrees with plan.  Final Clinical Impressions(s) / ED Diagnoses     ICD-10-CM   1. Urinary tract infection without hematuria, site unspecified  N39.0     2. Problem with Foley catheter, initial encounter (HCC)  T83.9XXA     3. Confusion  R41.0       ED Discharge Orders     None         Discharge Instructions Discussed with and Provided to Patient:   Discharge Instructions   None      Roxy Horseman, PA-C 05/26/23 Sarita Haver, MD 05/26/23 [phone]

## 2023-05-25 NOTE — ED Triage Notes (Addendum)
Pt BIB EMS from home for hematuria, per EMS pt had foley replaced today at home by the hospice nurse. EMS reports the family has noticed increased confusion and "hallucinations" described as reaching for things that are not there. Also reports increase in confusion at night. Hx of dementia.

## 2023-05-26 ENCOUNTER — Emergency Department (HOSPITAL_COMMUNITY)

## 2023-05-26 DIAGNOSIS — N390 Urinary tract infection, site not specified: Secondary | ICD-10-CM

## 2023-05-26 DIAGNOSIS — Z79899 Other long term (current) drug therapy: Secondary | ICD-10-CM | POA: Diagnosis not present

## 2023-05-26 DIAGNOSIS — A419 Sepsis, unspecified organism: Secondary | ICD-10-CM | POA: Diagnosis not present

## 2023-05-26 DIAGNOSIS — G309 Alzheimer's disease, unspecified: Secondary | ICD-10-CM | POA: Diagnosis present

## 2023-05-26 DIAGNOSIS — R456 Violent behavior: Secondary | ICD-10-CM | POA: Diagnosis not present

## 2023-05-26 DIAGNOSIS — E875 Hyperkalemia: Secondary | ICD-10-CM | POA: Diagnosis present

## 2023-05-26 DIAGNOSIS — G9341 Metabolic encephalopathy: Secondary | ICD-10-CM | POA: Diagnosis present

## 2023-05-26 DIAGNOSIS — F02A18 Dementia in other diseases classified elsewhere, mild, with other behavioral disturbance: Secondary | ICD-10-CM | POA: Diagnosis not present

## 2023-05-26 DIAGNOSIS — T839XXA Unspecified complication of genitourinary prosthetic device, implant and graft, initial encounter: Secondary | ICD-10-CM

## 2023-05-26 DIAGNOSIS — I2510 Atherosclerotic heart disease of native coronary artery without angina pectoris: Secondary | ICD-10-CM | POA: Diagnosis not present

## 2023-05-26 DIAGNOSIS — Z1152 Encounter for screening for COVID-19: Secondary | ICD-10-CM | POA: Diagnosis not present

## 2023-05-26 DIAGNOSIS — T83091A Other mechanical complication of indwelling urethral catheter, initial encounter: Secondary | ICD-10-CM | POA: Diagnosis present

## 2023-05-26 DIAGNOSIS — Z515 Encounter for palliative care: Secondary | ICD-10-CM | POA: Diagnosis not present

## 2023-05-26 DIAGNOSIS — E785 Hyperlipidemia, unspecified: Secondary | ICD-10-CM | POA: Diagnosis present

## 2023-05-26 DIAGNOSIS — I10 Essential (primary) hypertension: Secondary | ICD-10-CM | POA: Diagnosis not present

## 2023-05-26 DIAGNOSIS — J449 Chronic obstructive pulmonary disease, unspecified: Secondary | ICD-10-CM | POA: Diagnosis not present

## 2023-05-26 DIAGNOSIS — N401 Enlarged prostate with lower urinary tract symptoms: Secondary | ICD-10-CM | POA: Diagnosis not present

## 2023-05-26 DIAGNOSIS — Y846 Urinary catheterization as the cause of abnormal reaction of the patient, or of later complication, without mention of misadventure at the time of the procedure: Secondary | ICD-10-CM | POA: Diagnosis present

## 2023-05-26 DIAGNOSIS — D638 Anemia in other chronic diseases classified elsewhere: Secondary | ICD-10-CM | POA: Diagnosis present

## 2023-05-26 DIAGNOSIS — F03B18 Unspecified dementia, moderate, with other behavioral disturbance: Secondary | ICD-10-CM

## 2023-05-26 DIAGNOSIS — Z87891 Personal history of nicotine dependence: Secondary | ICD-10-CM | POA: Diagnosis not present

## 2023-05-26 DIAGNOSIS — A4181 Sepsis due to Enterococcus: Secondary | ICD-10-CM | POA: Diagnosis present

## 2023-05-26 DIAGNOSIS — H9190 Unspecified hearing loss, unspecified ear: Secondary | ICD-10-CM | POA: Diagnosis present

## 2023-05-26 DIAGNOSIS — F02C18 Dementia in other diseases classified elsewhere, severe, with other behavioral disturbance: Secondary | ICD-10-CM | POA: Diagnosis present

## 2023-05-26 DIAGNOSIS — F02C2 Dementia in other diseases classified elsewhere, severe, with psychotic disturbance: Secondary | ICD-10-CM | POA: Diagnosis present

## 2023-05-26 DIAGNOSIS — J4489 Other specified chronic obstructive pulmonary disease: Secondary | ICD-10-CM | POA: Diagnosis present

## 2023-05-26 DIAGNOSIS — R7881 Bacteremia: Secondary | ICD-10-CM | POA: Diagnosis not present

## 2023-05-26 DIAGNOSIS — I110 Hypertensive heart disease with heart failure: Secondary | ICD-10-CM | POA: Diagnosis present

## 2023-05-26 DIAGNOSIS — F32A Depression, unspecified: Secondary | ICD-10-CM | POA: Diagnosis not present

## 2023-05-26 DIAGNOSIS — Y738 Miscellaneous gastroenterology and urology devices associated with adverse incidents, not elsewhere classified: Secondary | ICD-10-CM | POA: Diagnosis present

## 2023-05-26 DIAGNOSIS — N39498 Other specified urinary incontinence: Secondary | ICD-10-CM | POA: Diagnosis present

## 2023-05-26 DIAGNOSIS — K219 Gastro-esophageal reflux disease without esophagitis: Secondary | ICD-10-CM | POA: Diagnosis present

## 2023-05-26 DIAGNOSIS — R531 Weakness: Secondary | ICD-10-CM | POA: Diagnosis not present

## 2023-05-26 DIAGNOSIS — D649 Anemia, unspecified: Secondary | ICD-10-CM

## 2023-05-26 DIAGNOSIS — Z7401 Bed confinement status: Secondary | ICD-10-CM | POA: Diagnosis not present

## 2023-05-26 DIAGNOSIS — I5032 Chronic diastolic (congestive) heart failure: Secondary | ICD-10-CM | POA: Diagnosis present

## 2023-05-26 DIAGNOSIS — R410 Disorientation, unspecified: Secondary | ICD-10-CM | POA: Diagnosis not present

## 2023-05-26 DIAGNOSIS — T83511A Infection and inflammatory reaction due to indwelling urethral catheter, initial encounter: Secondary | ICD-10-CM | POA: Diagnosis present

## 2023-05-26 DIAGNOSIS — R319 Hematuria, unspecified: Secondary | ICD-10-CM | POA: Diagnosis present

## 2023-05-26 DIAGNOSIS — Z9049 Acquired absence of other specified parts of digestive tract: Secondary | ICD-10-CM | POA: Diagnosis not present

## 2023-05-26 DIAGNOSIS — A4189 Other specified sepsis: Secondary | ICD-10-CM | POA: Diagnosis not present

## 2023-05-26 DIAGNOSIS — Z8249 Family history of ischemic heart disease and other diseases of the circulatory system: Secondary | ICD-10-CM | POA: Diagnosis not present

## 2023-05-26 LAB — URINALYSIS, W/ REFLEX TO CULTURE (INFECTION SUSPECTED)
Bilirubin Urine: NEGATIVE
Glucose, UA: NEGATIVE mg/dL
Ketones, ur: NEGATIVE mg/dL
Nitrite: NEGATIVE
Protein, ur: 30 mg/dL — AB
Specific Gravity, Urine: 1.01 (ref 1.005–1.030)
pH: 7 (ref 5.0–8.0)

## 2023-05-26 LAB — PROTIME-INR
INR: 1.2 (ref 0.8–1.2)
Prothrombin Time: 15.8 s — ABNORMAL HIGH (ref 11.4–15.2)

## 2023-05-26 LAB — COMPREHENSIVE METABOLIC PANEL
ALT: 14 U/L (ref 0–44)
AST: 34 U/L (ref 15–41)
Albumin: 3.1 g/dL — ABNORMAL LOW (ref 3.5–5.0)
Alkaline Phosphatase: 48 U/L (ref 38–126)
Anion gap: 9 (ref 5–15)
BUN: 18 mg/dL (ref 8–23)
CO2: 28 mmol/L (ref 22–32)
Calcium: 8.9 mg/dL (ref 8.9–10.3)
Chloride: 103 mmol/L (ref 98–111)
Creatinine, Ser: 0.8 mg/dL (ref 0.61–1.24)
GFR, Estimated: >60 mL/min (ref >60)
Glucose, Bld: 96 mg/dL (ref 70–99)
Potassium: 5.7 mmol/L — ABNORMAL HIGH (ref 3.5–5.1)
Sodium: 140 mmol/L (ref 135–145)
Total Bilirubin: 1 mg/dL (ref 0.0–1.2)
Total Protein: 5.2 g/dL — ABNORMAL LOW (ref 6.5–8.1)

## 2023-05-26 LAB — RESP PANEL BY RT-PCR (RSV, FLU A&B, COVID)  RVPGX2
Influenza A by PCR: NEGATIVE
Influenza B by PCR: NEGATIVE
Resp Syncytial Virus by PCR: NEGATIVE
SARS Coronavirus 2 by RT PCR: NEGATIVE

## 2023-05-26 LAB — APTT: aPTT: 25 s (ref 24–36)

## 2023-05-26 LAB — I-STAT CG4 LACTIC ACID, ED
Lactic Acid, Venous: 1.5 mmol/L (ref 0.5–1.9)
Lactic Acid, Venous: 1.1 mmol/L (ref 0.5–1.9)

## 2023-05-26 MED ORDER — ESCITALOPRAM OXALATE 20 MG PO TABS
20.0000 mg | ORAL_TABLET | Freq: Every day | ORAL | Status: DC
  Administered 2023-05-26 – 2023-06-01 (×7): 20 mg via ORAL
  Filled 2023-05-26 (×7): qty 1

## 2023-05-26 MED ORDER — ACETAMINOPHEN 325 MG PO TABS: 650.0000 mg | ORAL_TABLET | Freq: Four times a day (QID) | ORAL | Status: DC | PRN

## 2023-05-26 MED ORDER — POLYETHYLENE GLYCOL 3350 17 G PO PACK
17.0000 g | PACK | Freq: Every day | ORAL | Status: DC | PRN
  Administered 2023-05-30: 17 g via ORAL
  Filled 2023-05-26: qty 1

## 2023-05-26 MED ORDER — ACETAMINOPHEN 650 MG RE SUPP: 650.0000 mg | Freq: Four times a day (QID) | RECTAL | Status: DC | PRN

## 2023-05-26 MED ORDER — CHLORHEXIDINE GLUCONATE CLOTH 2 % EX PADS
6.0000 | MEDICATED_PAD | Freq: Every day | CUTANEOUS | Status: DC
  Administered 2023-05-26 – 2023-06-02 (×8): 6 via TOPICAL

## 2023-05-26 MED ORDER — ONDANSETRON HCL 4 MG PO TABS: 4.0000 mg | ORAL_TABLET | Freq: Four times a day (QID) | ORAL | Status: DC | PRN

## 2023-05-26 MED ORDER — LACTATED RINGERS IV SOLN
INTRAVENOUS | Status: DC
Start: 2023-05-26 — End: 2023-05-26

## 2023-05-26 MED ORDER — ACETAMINOPHEN 10 MG/ML IV SOLN
1000.0000 mg | Freq: Four times a day (QID) | INTRAVENOUS | Status: AC
  Administered 2023-05-26 – 2023-05-27 (×4): 1000 mg via INTRAVENOUS
  Filled 2023-05-26 (×4): qty 100

## 2023-05-26 MED ORDER — ONDANSETRON HCL 4 MG/2ML IJ SOLN: 4.0000 mg | Freq: Four times a day (QID) | INTRAMUSCULAR | Status: DC | PRN

## 2023-05-26 MED ORDER — VANCOMYCIN HCL IN DEXTROSE 1-5 GM/200ML-% IV SOLN
1000.0000 mg | Freq: Once | INTRAVENOUS | Status: AC
  Administered 2023-05-26: 1000 mg via INTRAVENOUS
  Filled 2023-05-26: qty 200

## 2023-05-26 MED ORDER — METRONIDAZOLE 500 MG/100ML IV SOLN
500.0000 mg | Freq: Once | INTRAVENOUS | Status: AC
  Administered 2023-05-26: 500 mg via INTRAVENOUS
  Filled 2023-05-26: qty 100

## 2023-05-26 MED ORDER — TRAZODONE HCL 50 MG PO TABS
100.0000 mg | ORAL_TABLET | Freq: Every day | ORAL | Status: DC
Start: 2023-05-26 — End: 2023-05-29
  Administered 2023-05-26 – 2023-05-28 (×3): 100 mg via ORAL
  Filled 2023-05-26 (×3): qty 2

## 2023-05-26 MED ORDER — ENOXAPARIN SODIUM 40 MG/0.4ML IJ SOSY
40.0000 mg | PREFILLED_SYRINGE | Freq: Every day | INTRAMUSCULAR | Status: DC
  Administered 2023-05-26 – 2023-06-01 (×7): 40 mg via SUBCUTANEOUS
  Filled 2023-05-26 (×8): qty 0.4

## 2023-05-26 MED ORDER — SODIUM CHLORIDE 0.9% FLUSH
3.0000 mL | Freq: Two times a day (BID) | INTRAVENOUS | Status: DC
  Administered 2023-05-26 – 2023-06-02 (×13): 3 mL via INTRAVENOUS

## 2023-05-26 MED ORDER — ALBUTEROL SULFATE (2.5 MG/3ML) 0.083% IN NEBU: 2.5000 mg | INHALATION_SOLUTION | Freq: Four times a day (QID) | RESPIRATORY_TRACT | Status: DC | PRN

## 2023-05-26 MED ORDER — SODIUM CHLORIDE 0.9 % IV SOLN
2.0000 g | Freq: Once | INTRAVENOUS | Status: AC
  Administered 2023-05-26: 2 g via INTRAVENOUS
  Filled 2023-05-26: qty 12.5

## 2023-05-26 MED ORDER — LORAZEPAM 0.5 MG PO TABS: 0.5000 mg | ORAL_TABLET | ORAL | Status: DC | PRN

## 2023-05-26 MED ORDER — LACTATED RINGERS IV BOLUS (SEPSIS)
1000.0000 mL | Freq: Once | INTRAVENOUS | Status: AC
  Administered 2023-05-26: 1000 mL via INTRAVENOUS

## 2023-05-26 MED ORDER — SODIUM CHLORIDE 0.9 % IV SOLN
2.0000 g | INTRAVENOUS | Status: DC
  Administered 2023-05-26: 2 g via INTRAVENOUS
  Filled 2023-05-26: qty 20

## 2023-05-26 MED ORDER — RISPERIDONE 1 MG PO TABS
1.0000 mg | ORAL_TABLET | Freq: Every day | ORAL | Status: DC
  Administered 2023-05-26 – 2023-06-01 (×7): 1 mg via ORAL
  Filled 2023-05-26 (×8): qty 1
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
## 2023-05-26 NOTE — ED Notes (Signed)
Cath irrigation attempted, large amount of bright red blood noted flowing from penis after removal. Ree Shay PA notified. PA attempt insertation of new cath without success

## 2023-05-26 NOTE — ED Notes (Signed)
 Urology at bedside.
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
## 2023-05-26 NOTE — Plan of Care (Signed)

## 2023-05-26 NOTE — Progress Notes (Signed)
 Pt being followed by [facility] for Sepsis protocol.

## 2023-05-26 NOTE — ED Notes (Signed)
Pt resting, awaiting urology, cart at bedside
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
## 2023-05-26 NOTE — ED Notes (Signed)
 Admitting at bedside

## 2023-05-27 DIAGNOSIS — N390 Urinary tract infection, site not specified: Secondary | ICD-10-CM | POA: Diagnosis not present

## 2023-05-27 LAB — BLOOD CULTURE ID PANEL (REFLEXED) - BCID2

## 2023-05-27 LAB — BASIC METABOLIC PANEL
Anion gap: 4 — ABNORMAL LOW (ref 5–15)
BUN: 17 mg/dL (ref 8–23)
CO2: 32 mmol/L (ref 22–32)
Calcium: 8.5 mg/dL — ABNORMAL LOW (ref 8.9–10.3)
Chloride: 104 mmol/L (ref 98–111)
Creatinine, Ser: 0.82 mg/dL (ref 0.61–1.24)
GFR, Estimated: >60 mL/min (ref >60)
Glucose, Bld: 80 mg/dL (ref 70–99)
Potassium: 4.1 mmol/L (ref 3.5–5.1)
Sodium: 140 mmol/L (ref 135–145)

## 2023-05-27 LAB — CBC
HCT: 27.1 % — ABNORMAL LOW (ref 39.0–52.0)
Hemoglobin: 8.7 g/dL — ABNORMAL LOW (ref 13.0–17.0)
MCH: 31.3 pg (ref 26.0–34.0)
MCHC: 32.1 g/dL (ref 30.0–36.0)
MCV: 97.5 fL (ref 80.0–100.0)
Platelets: 189 10*3/uL (ref 150–400)
RBC: 2.78 MIL/uL — ABNORMAL LOW (ref 4.22–5.81)
RDW: 13.7 % (ref 11.5–15.5)
WBC: 10.2 10*3/uL (ref 4.0–10.5)
nRBC: 0 % (ref 0.0–0.2)

## 2023-05-27 LAB — HEMOGLOBIN AND HEMATOCRIT, BLOOD
HCT: 28.4 % — ABNORMAL LOW (ref 39.0–52.0)
Hemoglobin: 8.9 g/dL — ABNORMAL LOW (ref 13.0–17.0)

## 2023-05-27 MED ORDER — SODIUM CHLORIDE 0.9 % IV SOLN
2.0000 g | INTRAVENOUS | Status: DC
  Administered 2023-05-27 – 2023-05-28 (×8): 2 g via INTRAVENOUS
  Filled 2023-05-27 (×9): qty 2000
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
## 2023-05-27 NOTE — Evaluation (Signed)
Occupational Therapy Evaluation Patient Details Name: Alexander [name] MRN: [id number] DOB: 1932/12/02 Today's Date: 05/27/2023   History of Present Illness The pt is an 89 yo male presenting to [facility] 1/31 with AMS and blood in urine after attempted indwelling catheter change. Admitted with sepsis and possible UTI. PMH includes: BPH, CAD, HTN, CHF, COPD, and Alzheimer's dementia on hospice care at home.   Clinical Impression    Pt poor historian and unable to confirm baseline level of functioning. Per chart review, performing functional mobility with RW and suspect family was assisting pt with ADLs and IADLs. Pt now presents with decreased decreased activity tolerance, decreased B UE coordination, decreased balance, generalized B UE weakness, and decreased safety and independence with functional tasks. Pt currently demonstrates ability to complete UB ADLs with Set up/Supervision to Mod assist, LB ADLs with Max to Total assist +2, and functional mobility/transfers with a RW with Mod assist +2. Pt requiring Max cues for sequencing, safety, initiation, and hand placement/technique throughout session. VSS on 2L continuous O2 through nasal cannula throughout session. Pt participated well. Pt will benefit from acute skilled OT services to address deficits outlined below, decrease caregiver burden, and increase safety and independence with functional tasks. Post acute discharge, pt will benefit from continued skilled OT services in the home to maximize rehab potential and for additional family training as needed.       If plan is discharge home, recommend the following: Two people to help with walking and/or transfers;Two people to help with bathing/dressing/bathroom;Assistance with cooking/housework;Assistance with feeding;Direct supervision/assist for medications management;Direct supervision/assist for financial management;Assist for transportation;Help with stairs or ramp for entrance;Supervision due to  cognitive status (set-up and orientation to tray for self-feeding)    Functional Status Assessment  Patient has had a recent decline in their functional status and demonstrates the ability to make significant improvements in function in a reasonable and predictable amount of time.  Equipment Recommendations  Other (comment) (TBD, no family/caregiver available to determine what equipment already has at home)    Recommendations for Other Services       Precautions / Restrictions Precautions Precautions: Fall Precaution Comments: dementia Restrictions Weight Bearing Restrictions Per Provider Order: No      Mobility Bed Mobility Overal bed mobility: Needs Assistance Bed Mobility: Supine to Sit     Supine to sit: Min assist, HOB elevated, Used rails     General bed mobility comments: increased time, light minA-CGA to complete movements    Transfers Overall transfer level: Needs assistance Equipment used: 2 person hand held assist, Rolling walker (2 wheels) Transfers: Sit to/from Stand, Bed to chair/wheelchair/BSC Sit to Stand: Mod assist, +2 physical assistance     Step pivot transfers: Mod assist, +2 physical assistance     General transfer comment: first attempted with HHA due to pt states he does not use DME at home, pt with posterior lean, bracking on bed, unable to achieve stable standing. then attempted with RW with cues and assist to place hands on RW, modA of 2 with less posterior lean. max cues and assist to RW for managing pivotal steps to chair      Balance Overall balance assessment: Needs assistance Sitting-balance support: No upper extremity supported, Feet supported Sitting balance-Leahy Scale: Fair   Postural control: Posterior lean (in standing) Standing balance support: Bilateral upper extremity supported, During functional activity, Reliant on assistive device for balance Standing balance-Leahy Scale: Poor Standing balance comment: posterior lean,  needed modA of 2 to maintain with mobility  ADL either performed or assessed with clinical judgement   ADL Overall ADL's : Needs assistance/impaired Eating/Feeding: Set up;Supervision/ safety;Sitting;Cueing for safety;Cueing for sequencing (cues for initiation; with orientation to breakfast try)   Grooming: Minimal assistance;Cueing for safety;Cueing for sequencing;Sitting (cues for initiation; with orientation to supplies)   Upper Body Bathing: Moderate assistance;Cueing for safety;Cueing for sequencing;Cueing for compensatory techniques;Sitting (with increased time; cues for initiaiton)   Lower Body Bathing: Maximal assistance;+2 for physical assistance;+2 for safety/equipment;Sit to/from stand;Sitting/lateral leans;Cueing for sequencing;Cueing for compensatory techniques (cues for initiation; with increased time)   Upper Body Dressing : Minimal assistance;Moderate assistance;Cueing for sequencing;Sitting;Cueing for compensatory techniques (cues for initiation; with increased time)   Lower Body Dressing: Maximal assistance;Total assistance;+2 for physical assistance;+2 for safety/equipment;Sit to/from stand;Cueing for safety;Cueing for sequencing;Cueing for compensatory techniques (cues for initiation; with increased time)   Toilet Transfer: Moderate assistance;+2 for physical assistance;+2 for safety/equipment;Cueing for safety;Cueing for sequencing;BSC/3in1;Rolling walker (2 wheels) (step-pivot; cues for initiation; with increased time; with assist to RW)   Toileting- Clothing Manipulation and Hygiene: Total assistance;+2 for physical assistance;+2 for safety/equipment;Sit to/from stand       Functional mobility during ADLs: Moderate assistance;+2 for physical assistance;+2 for safety/equipment;Cueing for safety;Cueing for sequencing;Rolling walker (2 wheels) (walker around bed to recliner on opposite side; cues for initiation; with increased time; with  assist to RW) General ADL Comments: Pt with decreased activity tolerance and requiring Max cues throughout session for initiation, safety, sequencing, and hand placement/technique     Vision Baseline Vision/History: 1 Wears glasses Ability to See in Adequate Light: 1 Impaired (difficult to assess due to cognition; appears to have difficulty seeing small print/items; vision appears [facility] for tasks assist with pt able to locate items of breakfast tray with cues for initiation) Patient Visual Report: Other (comment) (Pt unable to report this session)       Perception         Praxis         Pertinent Vitals/Pain Pain Assessment Pain Assessment: No/denies pain     Extremity/Trunk Assessment Upper Extremity Assessment Upper Extremity Assessment: Right hand dominant;Generalized weakness;Difficult to assess due to impaired cognition;RUE deficits/detail;LUE deficits/detail RUE Deficits / Details: difficult to fully assess due to cognition; generalized weakness, decreased shoulder AROM/AAROM; decreased proprioception noted during funcitonal tasks; decreased fine and gorss motor coordinaiton noted during functional tasks RUE Sensation: decreased proprioception RUE Coordination: decreased gross motor;decreased fine motor LUE Deficits / Details: difficult to fully assess due to cognition; generalized weakness, decreased shoulder AROM/AAROM; decreased proprioception noted during funcitonal tasks; decreased fine and gorss motor coordinaiton noted during functional tasks LUE Sensation: decreased proprioception LUE Coordination: decreased fine motor;decreased gross motor   Lower Extremity Assessment Lower Extremity Assessment: Defer to PT evaluation   Cervical / Trunk Assessment Cervical / Trunk Assessment: Kyphotic   Communication Communication Communication: Difficulty following commands/understanding;Difficulty communicating thoughts/reduced clarity of speech Following commands: Follows one  step commands inconsistently;Follows one step commands with increased time Cueing Techniques: Verbal cues;Gestural cues;Tactile cues;Visual cues   Cognition Arousal: Alert Behavior During Therapy: Flat affect Overall Cognitive Status: History of cognitive impairments - at baseline                                 General Comments: pt with hx of dementia, no family present to confirm baseline, pt oriented to self and location, not situation or time. able to follow simple cues with increased time. Pleasant throughout session and appears to  benefit from positive reinforcement.     General Comments  VSS on 2L continuous O2 through nasal cannula throughout session.    Exercises     Shoulder Instructions      Home Living Family/patient expects to be discharged to:: Private residence Living Arrangements: Children Available Help at Discharge: Family                             Additional Comments: no family present to provide history/PLOF info & pt unable. Per chart, pt lives with family, goes to adult daycare at [place], and is on home hospice.      Prior Functioning/Environment Prior Level of Function : Patient poor historian/Family not available             Mobility Comments: per chart, pt ambulates with RW, pt states no use of RW at home ADLs Comments: pt unable to report, suspect receiving assist from family        OT Problem List: Decreased strength;Decreased activity tolerance;Impaired balance (sitting and/or standing);Decreased coordination;Decreased knowledge of use of DME or AE      OT Treatment/Interventions: Self-care/ADL training;Therapeutic exercise;DME and/or AE instruction;Therapeutic activities;Patient/family education;Balance training    OT Goals(Current goals can be found in the care plan section) Acute Rehab OT Goals Patient Stated Goal: to go home OT Goal Formulation: Patient unable to participate in goal setting Time For Goal  Achievement: 06/10/23 Potential to Achieve Goals: Fair ADL Goals Pt Will Perform Upper Body Bathing: with min assist;sitting Pt Will Perform Lower Body Bathing: with mod assist;sit to/from stand;sitting/lateral leans Pt Will Perform Upper Body Dressing: with contact guard assist;sitting Pt Will Perform Lower Body Dressing: with mod assist;sit to/from stand;sitting/lateral leans Pt Will Transfer to Toilet: with contact guard assist;ambulating;bedside commode (with least restrictive AD)  OT Frequency: Min 1X/week    Co-evaluation PT/OT/SLP Co-Evaluation/Treatment: Yes Reason for Co-Treatment: Complexity of the patient's impairments (multi-system involvement);Necessary to address cognition/behavior during functional activity;For patient/therapist safety PT goals addressed during session: Mobility/safety with mobility;Balance OT goals addressed during session: ADL's and self-care      AM-PAC OT "6 Clicks" Daily Activity     Outcome Measure Help from another person eating meals?: A Little Help from another person taking care of personal grooming?: A Little Help from another person toileting, which includes using toliet, bedpan, or urinal?: Total Help from another person bathing (including washing, rinsing, drying)?: A Lot Help from another person to put on and taking off regular upper body clothing?: A Lot Help from another person to put on and taking off regular lower body clothing?: A Lot 6 Click Score: 13   End of Session Equipment Utilized During Treatment: Gait belt;Rolling walker (2 wheels);Oxygen Nurse Communication: Mobility status;Other (comment) (Pt sitting up in receliner, set up with breakfast, and self-feeding at end of session. Due to this, mitts left off.)  Activity Tolerance: Patient tolerated treatment well Patient left: in chair;with call bell/phone within reach;with chair alarm set  OT Visit Diagnosis: Unsteadiness on feet (R26.81);Other abnormalities of gait and  mobility (R26.89);Muscle weakness (generalized) (M62.81);Ataxia, unspecified (R27.0);Other symptoms and signs involving cognitive function;Other (comment) (decreased activity tolerance)                Time: 1610-9604 OT Time Calculation (min): 31 min Charges:  OT General Charges $OT Visit: 1 Visit OT Evaluation $OT Eval Moderate Complexity: 1 Mod OT Treatments $Self Care/Home Management : 23-37 mins  Nakyah Erdmann "Orson Eva., OTR/L, MA Acute Rehab [phone]  Lendon Colonel 05/27/2023, 1:15 PM

## 2023-05-27 NOTE — Evaluation (Signed)
Physical Therapy Evaluation Patient Details Name: Alexander [name] MRN: [id number] DOB: [birth year] Today's Date: 05/27/2023  History of Present Illness  The pt is an 89 yo male presenting 1/31 with AMS and blood in urine after attempted indwelling catheter change. Admitted with sepsis and possible UTI. PMH includes: BPH, CAD, HTN, CHF, COPD, and Alzheimer's dementia on hospice care at home.   Clinical Impression  Pt in bed upon arrival of PT, agreeable to evaluation at this time. No family present and pt is unable to provide reliable hx, but per charting from previous admission he lives with family, is on home hospice care, walks with RW, and attends [facility] for supervision during the day. The pt was able to complete bed mobility with significantly increased time but no physical assistance, but needed modA of 2 to manage sit-stand from EOB due to poor anterior wt shift and limited LE power. He benefits from both physical assist and UE support at this time, as well as multimodal cues and help to advance RW and facilitate turning. Given limited ambulation distance and increased assist needed for transfers at this time, the pt will continue to benefit from skilled PT acutely and after d/c to facilitate return to baseline level of independence with transfers and mobility.       If plan is discharge home, recommend the following: A lot of help with walking and/or transfers;A lot of help with bathing/dressing/bathroom;Assistance with cooking/housework;Assist for transportation;Help with stairs or ramp for entrance;Supervision due to cognitive status;Direct supervision/assist for medications management;Direct supervision/assist for financial management   Can travel by private vehicle        Equipment Recommendations Wheelchair (measurements PT);Wheelchair cushion (measurements PT)  Recommendations for Other Services       Functional Status Assessment Patient has had a recent decline in their  functional status and demonstrates the ability to make significant improvements in function in a reasonable and predictable amount of time.     Precautions / Restrictions Precautions Precautions: Fall Precaution Comments: dementia Restrictions Weight Bearing Restrictions Per Provider Order: No      Mobility  Bed Mobility Overal bed mobility: Needs Assistance Bed Mobility: Supine to Sit     Supine to sit: Min assist, HOB elevated, Used rails     General bed mobility comments: increased time, light minA-CGA to complete movements    Transfers Overall transfer level: Needs assistance Equipment used: 2 person hand held assist, Rolling walker (2 wheels) Transfers: Sit to/from Stand, Bed to chair/wheelchair/BSC Sit to Stand: Mod assist, +2 physical assistance   Step pivot transfers: Mod assist, +2 physical assistance       General transfer comment: first attempted with HHA due to pt states he does not use DME at home, pt with posterior lean, bracking on bed, unable to achieve stable standing. then attempted with RW with cues and assist to place hands on RW, modA of 2 with less posterior lean. max cues and assist to RW for managing pivotal steps to chair    Ambulation/Gait Ambulation/Gait assistance: Mod assist, +2 physical assistance Gait Distance (Feet): 15 Feet Assistive device: Rolling walker (2 wheels) Gait Pattern/deviations: Step-to pattern, Decreased stride length, Shuffle, Trunk flexed Gait velocity: decreased Gait velocity interpretation: <1.31 ft/sec, indicative of household ambulator   General Gait Details: assist with RW and continued cues for directions and to continue walking. modA to steady     Balance Overall balance assessment: Needs assistance Sitting-balance support: No upper extremity supported, Feet supported Sitting balance-Leahy Scale: Fair  Standing balance support: Bilateral upper extremity supported, During functional activity Standing  balance-Leahy Scale: Poor Standing balance comment: posterior lean, needed modA of 2 to maintain with mobility                             Pertinent Vitals/Pain Pain Assessment Pain Assessment: No/denies pain    Home Living Family/patient expects to be discharged to:: Private residence Living Arrangements: Children Available Help at Discharge: Family               Additional Comments: no family present to provide history/PLOF info & pt unable. Per chart, pt lives with family, goes to adult daycare at [place], and is on home hospice.    Prior Function Prior Level of Function : Patient poor historian/Family not available             Mobility Comments: per chart, pt ambulates with RW, pt states no use of RW at home ADLs Comments: pt unable to report, suspect receiving assist from family     Extremity/Trunk Assessment   Upper Extremity Assessment Upper Extremity Assessment: Defer to OT evaluation    Lower Extremity Assessment Lower Extremity Assessment: Generalized weakness    Cervical / Trunk Assessment Cervical / Trunk Assessment: Kyphotic  Communication   Communication Communication: Difficulty following commands/understanding Following commands: Follows one step commands inconsistently;Follows one step commands with increased time Cueing Techniques: Verbal cues;Gestural cues;Tactile cues;Visual cues  Cognition Arousal: Alert Behavior During Therapy: Flat affect Overall Cognitive Status: History of cognitive impairments - at baseline                                 General Comments: pt with hx of dementia, no family present to confirm baseline, pt oriented to self and location, not situation or time. able to follow simple cues with increased time.        General Comments General comments (skin integrity, edema, etc.): VSS on 2L O2        Assessment/Plan    PT Assessment Patient needs continued PT services  PT Problem List  Decreased strength;Decreased activity tolerance;Decreased balance;Decreased mobility;Decreased cognition;Decreased safety awareness       PT Treatment Interventions DME instruction;Gait training;Stair training;Functional mobility training;Therapeutic activities;Therapeutic exercise;Balance training;Patient/family education    PT Goals (Current goals can be found in the Care Plan section)  Acute Rehab PT Goals Patient Stated Goal: none stated, no family present PT Goal Formulation: Patient unable to participate in goal setting Time For Goal Achievement: 06/10/23 Potential to Achieve Goals: Good    Frequency Min 1X/week     Co-evaluation PT/OT/SLP Co-Evaluation/Treatment: Yes Reason for Co-Treatment: Complexity of the patient's impairments (multi-system involvement);Necessary to address cognition/behavior during functional activity;For patient/therapist safety PT goals addressed during session: Mobility/safety with mobility;Balance         AM-PAC PT "6 Clicks" Mobility  Outcome Measure Help needed turning from your back to your side while in a flat bed without using bedrails?: A Little Help needed moving from lying on your back to sitting on the side of a flat bed without using bedrails?: A Little Help needed moving to and from a bed to a chair (including a wheelchair)?: A Lot Help needed standing up from a chair using your arms (e.g., wheelchair or bedside chair)?: A Lot Help needed to walk in hospital room?: Total (<20 ft) Help needed climbing 3-5 steps with a railing? :  Total 6 Click Score: 12    End of Session Equipment Utilized During Treatment: Gait belt;Oxygen Activity Tolerance: Patient tolerated treatment well Patient left: in chair;with call bell/phone within reach;with chair alarm set (out of mitts with breakfast) Nurse Communication: Mobility status;Need for lift equipment (use of stedy, out of mitts with breakfast) PT Visit Diagnosis: Unsteadiness on feet  (R26.81);Muscle weakness (generalized) (M62.81)    Time: 5784-6962 PT Time Calculation (min) (ACUTE ONLY): 28 min   Charges:   PT Evaluation $PT Eval Moderate Complexity: 1 Mod   PT General Charges $$ ACUTE PT VISIT: 1 Visit         Vickki Muff, PT, DPT   Acute Rehabilitation Department Office [phone] Secure Chat Communication Preferred  Ronnie Derby 05/27/2023, 11:46 AM

## 2023-05-27 NOTE — Progress Notes (Addendum)
PHARMACY - PHYSICIAN COMMUNICATION CRITICAL VALUE ALERT - BLOOD CULTURE IDENTIFICATION (BCID)  Alexander [name] is an 89 y.o. male who presented to [facility] on 05/25/2023 with a chief complaint of hematuria. Patient started on ceftriaxone given history of klebsiella pneumoniae in the blood (R-ampicillin; 11/2022)  Assessment:  2/2 blood cultures enterococcus faecalis w/ no resistance. Urine culture no growth  Name of physician (or Provider) Contacted: Dr. Janalyn Shy  Current antibiotics:  -Ceftriaxone 2g IV every 24 hours  -Received dose of vancomycin 1g on 2/1   Changes to prescribed antibiotics recommended:   -Stop Ceftriaxone 2g IV every 24 hours -Start ampicillin 2g IV every 4h  Results for orders placed or performed during the hospital encounter of 05/25/23  Blood Culture ID Panel (Reflexed) (Collected: 05/26/2023  4:50 AM)  Result Value Ref Range   Enterococcus faecalis DETECTED (A) NOT DETECTED   Enterococcus Faecium NOT DETECTED NOT DETECTED   Listeria monocytogenes NOT DETECTED NOT DETECTED   Staphylococcus species NOT DETECTED NOT DETECTED   Staphylococcus aureus (BCID) NOT DETECTED NOT DETECTED   Staphylococcus epidermidis NOT DETECTED NOT DETECTED   Staphylococcus lugdunensis NOT DETECTED NOT DETECTED   Streptococcus species NOT DETECTED NOT DETECTED   Streptococcus agalactiae NOT DETECTED NOT DETECTED   Streptococcus pneumoniae NOT DETECTED NOT DETECTED   Streptococcus pyogenes NOT DETECTED NOT DETECTED   A.calcoaceticus-baumannii NOT DETECTED NOT DETECTED   Bacteroides fragilis NOT DETECTED NOT DETECTED   Enterobacterales NOT DETECTED NOT DETECTED   Enterobacter cloacae complex NOT DETECTED NOT DETECTED   Escherichia coli NOT DETECTED NOT DETECTED   Klebsiella aerogenes NOT DETECTED NOT DETECTED   Klebsiella oxytoca NOT DETECTED NOT DETECTED   Klebsiella pneumoniae NOT DETECTED NOT DETECTED   Proteus species NOT DETECTED NOT DETECTED   Salmonella species NOT  DETECTED NOT DETECTED   Serratia marcescens NOT DETECTED NOT DETECTED   Haemophilus influenzae NOT DETECTED NOT DETECTED   Neisseria meningitidis NOT DETECTED NOT DETECTED   Pseudomonas aeruginosa NOT DETECTED NOT DETECTED   Stenotrophomonas maltophilia NOT DETECTED NOT DETECTED   Candida albicans NOT DETECTED NOT DETECTED   Candida auris NOT DETECTED NOT DETECTED   Candida glabrata NOT DETECTED NOT DETECTED   Candida krusei NOT DETECTED NOT DETECTED   Candida parapsilosis NOT DETECTED NOT DETECTED   Candida tropicalis NOT DETECTED NOT DETECTED   Cryptococcus neoformans/gattii NOT DETECTED NOT DETECTED   Vancomycin resistance NOT DETECTED NOT DETECTED    Arabella Merles, PharmD. Clinical Pharmacist 05/27/2023 2:11 AM

## 2023-05-27 NOTE — Progress Notes (Addendum)
Enterococcus faecalis bacteremia Pharmacy notified that micro lab called and reporting that patient has 2 out of 2 blood cultures are Enterococcus faecalis without any resistance.  Currently patient is on ceftriaxone for treating for Klebsiella pneumoniae UTI.  However patient's urine culture so far no growth.Pharmacy recommending switching ceftriaxone to ampicillin 2 g IV every 4 hours.  Tereasa Coop, MD [facility] 05/27/2023, 2:18 AM

## 2023-05-28 DIAGNOSIS — R410 Disorientation, unspecified: Secondary | ICD-10-CM | POA: Diagnosis not present

## 2023-05-28 DIAGNOSIS — Z515 Encounter for palliative care: Secondary | ICD-10-CM | POA: Diagnosis not present

## 2023-05-28 DIAGNOSIS — Z711 Person with feared health complaint in whom no diagnosis is made: Secondary | ICD-10-CM

## 2023-05-28 DIAGNOSIS — T839XXA Unspecified complication of genitourinary prosthetic device, implant and graft, initial encounter: Secondary | ICD-10-CM | POA: Diagnosis not present

## 2023-05-28 DIAGNOSIS — N390 Urinary tract infection, site not specified: Secondary | ICD-10-CM | POA: Diagnosis not present

## 2023-05-28 DIAGNOSIS — Z789 Other specified health status: Secondary | ICD-10-CM

## 2023-05-28 MED ORDER — PIPERACILLIN-TAZOBACTAM 3.375 G IVPB
3.3750 g | Freq: Three times a day (TID) | INTRAVENOUS | Status: DC
Start: 2023-05-28 — End: 2023-05-29
  Administered 2023-05-28 – 2023-05-29 (×3): 3.375 g via INTRAVENOUS
  Filled 2023-05-28 (×3): qty 50

## 2023-05-28 MED ORDER — ENSURE ENLIVE PO LIQD
237.0000 mL | Freq: Two times a day (BID) | ORAL | Status: DC
  Administered 2023-05-28 – 2023-06-02 (×9): 237 mL via ORAL

## 2023-05-28 NOTE — Progress Notes (Signed)
Brief Palliative Medicine Progress Note:  PMT consult received and chart reviewed.   Noted Mr. Goodchild is a current hospice patient with [employer] [facility] I spoke with [facility] liaison, Henderson Newcomer, who is aware the patient is admitted. An [facility] representative will be in contact for GOC as they have an established relationship with Mr. Newbury and family. They do not feel PMT involvement is needed at this time.  [facility] will reach out to PMT directly if any needs arise.   Thank you for allowing PMT to assist in the care of this patient.  Del Overfelt M. Katrinka [facility] Palliative Medicine Team Team Phone: [phone] NO CHARGE

## 2023-05-28 NOTE — Consult Note (Signed)
Consultation Note Date: 05/28/2023   Patient Name: Alexander [name]  DOB: [birth year]  MRN: [id number]  Age / Sex: 89 y.o., male  PCP: [name], MD Referring Physician: Willeen Niece, MD  Reason for Consultation: Establishing goals of care  HPI/Patient Profile: 89 y.o. male  with past medical history of hypertension, hyperlipidemia, nonobstructive CAD, COPD, BPH with chronic indwelling Foley, depression, and severe dementia was admitted on 05/25/2023 with suspected sepsis complicated urinary tract infection, complication of Foley catheter, normocytic anemia, hyperkalemia, and dementia with behavioral disturbance.   Of note, patient has had 4 admissions and 1 ED visit in the last 6 months.  He is currently enrolled in [place] hospice care.  [facility] liaison has discussed goals of care with family.  [facility] liaison requests PMT assistance to further clarify goals while admitted.  Clinical Assessment and Goals of Care: I have reviewed medical records including EPIC notes, labs, any available advanced directives, and imaging.  Unable to receive report from primary RN.  Went to visit patient at bedside - no family/visitors present. Patient was lying in bed. No signs or non-verbal gestures of pain or discomfort noted. No respiratory distress, increased work of breathing, or secretions noted.   Attempted to call daughter/POA/Sonja to discuss diagnosis, prognosis, GOC, EOL wishes, disposition, and options - no answer - confidential voicemail left and PMT phone number provided with request to return call.   Primary Decision Maker: HCPOA - daughter/Sonja Sharpe    SUMMARY OF RECOMMENDATIONS   Continue current plan of care Continue full code as previously documented Attempted to call daughter/Sonja for GOC - unable to reach - left VM with request to return call.  Will try to reach out again tomorrow 2/4 PMT  will continue to follow and support holistically   Code Status/Advance Care Planning: Full code  Palliative Prophylaxis:  Aspiration, Delirium Protocol, Oral Care, and Turn Reposition  Additional Recommendations (Limitations, Scope, Preferences): Full Scope Treatment  Psycho-social/Spiritual:  Desire for further Chaplaincy support:no Created space and opportunity for patient and family to express thoughts and feelings regarding patient's current medical situation.  Emotional support and therapeutic listening provided.  Prognosis:  Overall poor  Discharge Planning: To Be Determined      Primary Diagnoses: Present on Admission:  Complication of Foley catheter (HCC)  Complicated UTI (urinary tract infection)  Sepsis (HCC)  Normocytic anemia  Moderate dementia with behavioral disturbance  Hyperkalemia   I have reviewed the medical record, interviewed the patient and family, and examined the patient. The following aspects are pertinent.  Past Medical History:  Diagnosis Date   Alzheimers disease (HCC)    mild   Anxiety    Asthma    BPH (benign prostatic hyperplasia)    CAD (coronary artery disease)    Constipation    COPD (chronic obstructive pulmonary disease) (HCC)    chronic dyspnea   Dementia (HCC)    wife answered he has Dementia   Depression    GERD (gastroesophageal reflux disease)    Hearing loss    Hemorrhoids    HYPERLIPIDEMIA    Hypertension    Hypertensive heart disease    LVH (left ventricular hypertrophy)    a. 2014 Echo: EF 65-70%, no rwma, Gr1 DD, mild MR.   Non-obstructive CAD (coronary artery disease)    a. 02/2009 Cath: essentially nl cors; b. 07/2014 MV: mild diaph atten, no ischemia-->low risk.   Social History   Socioeconomic History   Marital status: Married    Spouse name: Not  on file   Number of children: 8   Years of education: 8   Highest education level: Not on file  Occupational History   Occupation: RETIRED    Employer:  RETIRED    Comment: MACHINE OPERATOR  Tobacco Use   Smoking status: Former    Current packs/day: 0.00    Average packs/day: 0.3 packs/day for 48.0 years (12.0 ttl pk-yrs)    Types: Cigarettes    Start date: 04/24/1952    Quit date: 04/24/2000    Years since quitting: 23.1   Smokeless tobacco: Never  Vaping Use   Vaping status: Never Used  Substance and Sexual Activity   Alcohol use: Not Currently   Drug use: No   Sexual activity: Yes  Other Topics Concern   Not on file  Social History Narrative   EXERCISES INTERMITTENTLY AT THE GYM      5 GRANDCHILDREN      2 GREAT-CHILDREN            WEARS GLASSES      Lives in one story home with wife.      Right handed   Social Drivers of Health   Financial Resource Strain: Low Risk  (09/07/2017)   Overall Financial Resource Strain (CARDIA)    Difficulty of Paying Living Expenses: Not hard at all  Food Insecurity: No Food Insecurity (05/26/2023)   Hunger Vital Sign    Worried About Running Out of Food in the Last Year: Never true    Ran Out of Food in the Last Year: Never true  Transportation Needs: No Transportation Needs (05/26/2023)   PRAPARE - Administrator, Civil Service (Medical): No    Lack of Transportation (Non-Medical): No  Physical Activity: Sufficiently Active (09/07/2017)   Exercise Vital Sign    Days of Exercise per Week: 3 days    Minutes of Exercise per Session: 50 min  Stress: No Stress Concern Present (09/07/2017)   [employer] of Occupational Health - Occupational Stress Questionnaire    Feeling of Stress : Not at all  Social Connections: Moderately Isolated (05/26/2023)   Social Connection and Isolation Panel [NHANES]    Frequency of Communication with Friends and Family: More than three times a week    Frequency of Social Gatherings with Friends and Family: Once a week    Attends Religious Services: 1 to 4 times per year    Active Member of [employer] or Organizations: No    Attends Tax inspector Meetings: Never    Marital Status: Widowed   Family History  Problem Relation Age of Onset   Pancreatic cancer Mother 46   Hypertension Mother    Aneurysm Father 95       brain   Stroke Father    Hypertension Father    Hyperlipidemia Father    Prostate cancer Brother        x 2 bro   Scheduled Meds:  Chlorhexidine Gluconate Cloth  6 each Topical Daily   enoxaparin (LOVENOX) injection  40 mg Subcutaneous Daily   escitalopram  20 mg Oral QHS   feeding supplement  237 mL Oral BID BM   risperiDONE  1 mg Oral QHS   sodium chloride flush  3 mL Intravenous Q12H   traZODone  100 mg Oral Daily   Continuous Infusions:  piperacillin-tazobactam (ZOSYN)  IV 3.375 g (05/28/23 1221)   PRN Meds:.acetaminophen **OR** acetaminophen, albuterol, LORazepam, ondansetron **OR** ondansetron (ZOFRAN) IV, polyethylene glycol Medications Prior to Admission:  Prior  to Admission medications   Medication Sig Start Date End Date Taking? Authorizing Provider  acetaminophen (TYLENOL) 500 MG tablet Take 1,000 mg by mouth every 6 (six) hours as needed for mild pain (pain score 1-3), fever or headache.   Yes [provider]  albuterol (PROVENTIL) (2.5 MG/3ML) 0.083% nebulizer solution TAKE 3 MLS BY NEBULIZATION EVERY 6 HOURS AS NEEDED FOR WHEEZING OR SHORTNESS OF BREATH. 02/04/21  Yes Nyoka Cowden, MD  docusate sodium (COLACE) 100 MG capsule Take 1 capsule (100 mg total) by mouth 2 (two) times daily. Patient taking differently: Take 100 mg by mouth 2 (two) times daily as needed for mild constipation. 02/23/23  Yes Rolly Salter, MD  escitalopram (LEXAPRO) 20 MG tablet Take 1 tablet (20 mg total) by mouth at bedtime. 02/23/23  Yes Rolly Salter, MD  LORazepam (ATIVAN) 0.5 MG tablet Take 0.5 mg by mouth every 4 (four) hours as needed for anxiety.   Yes [provider]  polyethylene glycol (MIRALAX) 17 g packet Take 17 g by mouth daily. Patient taking differently: Take 17 g by  mouth daily as needed for moderate constipation. 02/23/23  Yes Rolly Salter, MD  risperiDONE (RISPERDAL) 1 MG tablet Take 1 mg by mouth at bedtime.   Yes [provider]  traZODone (DESYREL) 100 MG tablet Take 100 mg by mouth daily. 10/04/22  Yes [provider]   No Known Allergies Review of Systems  Unable to perform ROS: Dementia    Physical Exam Vitals and nursing note reviewed.  Constitutional:      General: He is not in acute distress.    Appearance: He is ill-appearing.  Pulmonary:     Effort: No respiratory distress.  Skin:    General: Skin is warm and dry.  Neurological:     Motor: Weakness present.     Vital Signs: BP 121/69 (BP Location: Left Arm)   Pulse 70   Temp 97.6 F (36.4 C) (Oral)   Resp 18   Ht 5\' 8"  (1.727 m)   Wt 65.1 kg   SpO2 100%   BMI 21.82 kg/m  Pain Scale: 0-10   Pain Score: 0-No pain   SpO2: SpO2: 100 % O2 Device:SpO2: 100 % O2 Flow Rate: .O2 Flow Rate (L/min): 2 L/min  IO: Intake/output summary:  Intake/Output Summary (Last 24 hours) at 05/28/2023 1434 Last data filed at 05/28/2023 0836 Gross per 24 hour  Intake 200 ml  Output 1200 ml  Net -1000 ml    LBM: Last BM Date : 05/27/23 Baseline Weight: Weight: 65.1 kg Most recent weight: Weight: 65.1 kg     Palliative Assessment/Data:     Time In: 1415 Time Out: 1500 Time Total: 45 minutes  Signed by: Haskel Khan, NP   Please contact Palliative Medicine Team phone at [phone] for questions and concerns.  For individual provider: See Amion  *Portions of this note are a verbal dictation therefore any spelling and/or grammatical errors are due to the "Dragon Medical One" system interpretation.

## 2023-05-28 NOTE — Progress Notes (Signed)
[facility] 6E45 [facility]       This patient is a current hospice patient with [facility], admitted 12.2.24  with hospice diagnosis of abnormal weight loss.      [facility] will continue to follow for any discharge planning needs and to coordinate continuation of hospice care.    Please don't hesitate to call with any Hospice related questions or concerns.    Henderson Newcomer, LPN [facility] Liaison [phone]
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
## 2023-05-28 NOTE — Plan of Care (Signed)

## 2023-05-28 NOTE — Progress Notes (Signed)
Pharmacy Antibiotic Note  Alexander [name] is a 89 y.o. male admitted on 05/25/2023 with sepsis.  Pharmacy has been consulted for Zosyn dosing.  Blood with enterococcus feacalis, urine with both enterococcus and pseudomonas.   Plan: Zosyn 3.375g IV q8h (4 hour infusion). Monitor cultures for susceptibilities Monitor renal function  Height: 5\' 8"  (172.7 cm) Weight: 65.1 kg (143 lb 8.3 oz) IBW/kg (Calculated) : 68.4  Temp (24hrs), Avg:98.1 F (36.7 C), Min:97.6 F (36.4 C), Max:98.5 F (36.9 C)  Recent Labs  Lab 05/25/23 2228 05/26/23 0430 05/26/23 0512 05/26/23 0837 05/27/23 0615  WBC 10.3  --   --   --  10.2  CREATININE 0.77 0.80  --   --  0.82  LATICACIDVEN  --   --  1.5 1.1  --     Estimated Creatinine Clearance: 57.3 mL/min (by C-G formula based on SCr of 0.82 mg/dL).    No Known Allergies  Thank you for allowing pharmacy to be a part of this patient's care.  Jeanella Cara, PharmD, [facility] Clinical Pharmacist Please see AMION for all Pharmacists' Contact Phone Numbers 05/28/2023, 10:03 AM

## 2023-05-28 NOTE — Progress Notes (Signed)
PROGRESS NOTE    SYE SCHROEPFER  ZOX:[id number] DOB: 1932-12-27 DOA: 05/25/2023 PCP: [name], MD   Brief Narrative:  This 89 yrs. old male with medical history significant of hypertension, hyperlipidemia, nonobstructive CAD, COPD, BPH with chronic indwelling Foley, depression, and severe dementia currently on hospice who presented to the ED due to his Foley catheter not draining urine properly. He is accompanied by his daughter and son-in-law who provided his history.  Patient has enlarged prostate, and has been experiencing UTI almost every month.  Daughter reports his urine appeared concerning for infection, he has been having shaking and hallucinations which is not usual for him.  He is found to have fever of 103 in the ED.  Last UTI was in August with Klebsiella pneumonia identified in culture.  UA significant for many bacteria, leukocytes WBCs.  Blood and urine cultures were obtained.  Urology was consulted for difficult Foley insertion.  Patient was empirically started on antibiotics for suspected sepsis secondary to complicated UTI.   Assessment & Plan:   Principal Problem:   Complicated UTI (urinary tract infection) Active Problems:   Sepsis (HCC)   Complication of Foley catheter (HCC)   Normocytic anemia   Hyperkalemia   Moderate dementia with behavioral disturbance  Suspected sepsis secondary to complicated UTI: Patient has chronic indwelling Foley catheter in place.   He was noted to be febrile in the ED with 103F, tachycardia meeting SIRS criteria. UA after replacement of Foley catheter noted large hemoglobin,  leukocytes,  many bacteria and WBC. Lactic acid was reassuring at 1.5.  Urine and blood culture was obtained.   Prior cultures from 11/2022 grew out Klebsiella pneumonia that appears sensitive to Rocephin.   Patient initiated on empiric antibiotics  (metronidazole, vancomycin, and cefepime). De-escalate antibiotics as cultures comes back. RVP panel  negative.  Blood cultures grew Enterococcus faecalis. Urine culture grew enterococci and Pseudomonas.  Antibiotics changed to zosyn. Repeat blood cultures are ordered, ID consulted.  UTI due to clogged Foley catheter: Hematuria > Resolved. Patient presented due to hematuria and no urine output. Urology had been formally consulted and replaced a Foley catheter while in the emergency department. Follow-up with urology for possible suprapubic catheter Appreciate urology consultative services.   Normocytic anemia: Chronic.  Baseline hemoglobin remains around 11.0. Hemoglobin is dropped to 8.7, urine is clear. There is no other visible signs of any active bleeding. Monitor H&H   Hyperkalemia: Potassium normalized.   Dementia with behavioral disturbance: Daughter noted that patient had been having hallucinations and been more altered than normal.   Suspect urinary tract infection possibly cause for symptoms -Delirium precautions -Continue Risperdal and Ativan as needed.  Goals of care: Patient is in hospice at home presented with UTI.   Family wanted full code. Palliative care consulted to clarify goals of care.   DVT prophylaxis: Lovenox Code Status: Full code Family Communication: No family at bedside Disposition Plan:    Status is: Inpatient Remains inpatient appropriate because: Admitted for complicated UTI,  leading to sepsis.    Consultants:  Urology Infectious disease Palliative care  Procedures: None  Antimicrobials:  Anti-infectives (From admission, onward)    Start     Dose/Rate Route Frequency Ordered Stop   05/28/23 1100  piperacillin-tazobactam (ZOSYN) IVPB 3.375 g        3.375 g 12.5 mL/hr over 240 Minutes Intravenous Every 8 hours 05/28/23 1001     05/27/23 0315  ampicillin (OMNIPEN) 2 g in sodium chloride 0.9 % 100 mL  IVPB  Status:  Discontinued        2 g 300 mL/hr over 20 Minutes Intravenous Every 4 hours 05/27/23 0220 05/28/23 1001   05/26/23 1200   cefTRIAXone (ROCEPHIN) 2 g in sodium chloride 0.9 % 100 mL IVPB  Status:  Discontinued        2 g 200 mL/hr over 30 Minutes Intravenous Every 24 hours 05/26/23 0749 05/27/23 0220   05/26/23 0500  ceFEPIme (MAXIPIME) 2 g in sodium chloride 0.9 % 100 mL IVPB        2 g 200 mL/hr over 30 Minutes Intravenous  Once 05/26/23 0458 05/26/23 0601   05/26/23 0500  metroNIDAZOLE (FLAGYL) IVPB 500 mg        500 mg 100 mL/hr over 60 Minutes Intravenous  Once 05/26/23 0458 05/26/23 0559   05/26/23 0500  vancomycin (VANCOCIN) IVPB 1000 mg/200 mL premix        1,000 mg 200 mL/hr over 60 Minutes Intravenous  Once 05/26/23 0458 05/26/23 0651       Subjective: Patient was seen and examined at bedside.  Overnight events noted. Patient appears very deconditioned, he was following commands,  reports doing better,  denies any pain.   Objective: Vitals:   05/27/23 1626 05/27/23 1942 05/28/23 0457 05/28/23 0836  BP: 138/65 127/68 114/68 121/69  Pulse: 84 84 72 70  Resp: 18 18 18 18   Temp: 98.3 F (36.8 C) 98.5 F (36.9 C) 98.1 F (36.7 C) 97.6 F (36.4 C)  TempSrc:    Oral  SpO2:  98% 100%   Weight:      Height:        Intake/Output Summary (Last 24 hours) at 05/28/2023 1251 Last data filed at 05/28/2023 0836 Gross per 24 hour  Intake 200 ml  Output 1200 ml  Net -1000 ml   Filed Weights   05/25/23 2155  Weight: 65.1 kg    Examination:  General exam: Appears calm and comfortable, deconditioned, sick looking, not in any acute distress. Respiratory system: CTA bilaterally. Respiratory effort normal.  RR 14 Cardiovascular system: S1 & S2 heard, RRR. No JVD, murmurs, rubs, gallops or clicks.  Gastrointestinal system: Abdomen is non distended, soft and non tender.  Normal bowel sounds heard. Central nervous system: Alert and oriented x 1. No focal neurological deficits. Extremities: No edema, no cyanosis, no clubbing Skin: No rashes, lesions or ulcers Psychiatry: Mood & affect appropriate.      Data Reviewed: I have personally reviewed following labs and imaging studies  CBC: Recent Labs  Lab 05/25/23 2228 05/27/23 0615 05/27/23 1508  WBC 10.3 10.2  --   HGB 11.0* 8.7* 8.9*  HCT 35.3* 27.1* 28.4*  MCV 97.5 97.5  --   PLT 279 189  --    Basic Metabolic Panel: Recent Labs  Lab 05/25/23 2228 05/26/23 0430 05/27/23 0615  NA 140 140 140  K 4.3 5.7* 4.1  CL 101 103 104  CO2 29 28 32  GLUCOSE 119* 96 80  BUN 16 18 17   CREATININE 0.77 0.80 0.82  CALCIUM 9.6 8.9 8.5*   GFR: Estimated Creatinine Clearance: 57.3 mL/min (by C-G formula based on SCr of 0.82 mg/dL). Liver Function Tests: Recent Labs  Lab 05/26/23 0430  AST 34  ALT 14  ALKPHOS 48  BILITOT 1.0  PROT 5.2*  ALBUMIN 3.1*   No results for input(s): "LIPASE", "AMYLASE" in the last 168 hours. No results for input(s): "AMMONIA" in the last 168 hours. Coagulation Profile: Recent Labs  Lab 05/26/23 0510  INR 1.2   Cardiac Enzymes: No results for input(s): "CKTOTAL", "CKMB", "CKMBINDEX", "TROPONINI" in the last 168 hours. BNP (last 3 results) No results for input(s): "PROBNP" in the last 8760 hours. HbA1C: No results for input(s): "HGBA1C" in the last 72 hours. CBG: No results for input(s): "GLUCAP" in the last 168 hours. Lipid Profile: No results for input(s): "CHOL", "HDL", "LDLCALC", "TRIG", "CHOLHDL", "LDLDIRECT" in the last 72 hours. Thyroid Function Tests: No results for input(s): "TSH", "T4TOTAL", "FREET4", "T3FREE", "THYROIDAB" in the last 72 hours. Anemia Panel: No results for input(s): "VITAMINB12", "FOLATE", "FERRITIN", "TIBC", "IRON", "RETICCTPCT" in the last 72 hours. Sepsis Labs: Recent Labs  Lab 05/26/23 1610 05/26/23 0837  LATICACIDVEN 1.5 1.1    Recent Results (from the past 240 hours)  Culture, blood (single)     Status: Abnormal (Preliminary result)   Collection Time: 05/26/23  4:50 AM   Specimen: BLOOD  Result Value Ref Range Status   Specimen Description BLOOD  SITE NOT SPECIFIED  Final   Special Requests   Final    BOTTLES DRAWN AEROBIC AND ANAEROBIC Blood Culture results may not be optimal due to an inadequate volume of blood received in culture bottles   Culture  Setup Time   Final    GRAM POSITIVE COCCI IN BOTH AEROBIC AND ANAEROBIC BOTTLES CRITICAL RESULT CALLED TO, READ BACK BY AND VERIFIED WITH: J WYLAND,PHARMD@0200  05/27/23 MK    Culture (A)  Final    ENTEROCOCCUS AVIUM ENTEROCOCCUS FAECALIS Standardized susceptibility testing for this organism is not available. Performed at [facility], [address], [place], [place] [address]    Report Status PENDING  Incomplete  Blood Culture ID Panel (Reflexed)     Status: Abnormal   Collection Time: 05/26/23  4:50 AM  Result Value Ref Range Status   Enterococcus faecalis DETECTED (A) NOT DETECTED Final    Comment: CRITICAL RESULT CALLED TO, READ BACK BY AND VERIFIED WITH: J WYLAND,PHARMD@0200  05/27/23 MK    Enterococcus Faecium NOT DETECTED NOT DETECTED Final   Listeria monocytogenes NOT DETECTED NOT DETECTED Final   Staphylococcus species NOT DETECTED NOT DETECTED Final   Staphylococcus aureus (BCID) NOT DETECTED NOT DETECTED Final   Staphylococcus epidermidis NOT DETECTED NOT DETECTED Final   Staphylococcus lugdunensis NOT DETECTED NOT DETECTED Final   Streptococcus species NOT DETECTED NOT DETECTED Final   Streptococcus agalactiae NOT DETECTED NOT DETECTED Final   Streptococcus pneumoniae NOT DETECTED NOT DETECTED Final   Streptococcus pyogenes NOT DETECTED NOT DETECTED Final   A.calcoaceticus-baumannii NOT DETECTED NOT DETECTED Final   Bacteroides fragilis NOT DETECTED NOT DETECTED Final   Enterobacterales NOT DETECTED NOT DETECTED Final   Enterobacter cloacae complex NOT DETECTED NOT DETECTED Final   Escherichia coli NOT DETECTED NOT DETECTED Final   Klebsiella aerogenes NOT DETECTED NOT DETECTED Final   Klebsiella oxytoca NOT DETECTED NOT DETECTED Final   Klebsiella  pneumoniae NOT DETECTED NOT DETECTED Final   Proteus species NOT DETECTED NOT DETECTED Final   Salmonella species NOT DETECTED NOT DETECTED Final   Serratia marcescens NOT DETECTED NOT DETECTED Final   Haemophilus influenzae NOT DETECTED NOT DETECTED Final   Neisseria meningitidis NOT DETECTED NOT DETECTED Final   Pseudomonas aeruginosa NOT DETECTED NOT DETECTED Final   Stenotrophomonas maltophilia NOT DETECTED NOT DETECTED Final   Candida albicans NOT DETECTED NOT DETECTED Final   Candida auris NOT DETECTED NOT DETECTED Final   Candida glabrata NOT DETECTED NOT DETECTED Final   Candida krusei NOT DETECTED  NOT DETECTED Final   Candida parapsilosis NOT DETECTED NOT DETECTED Final   Candida tropicalis NOT DETECTED NOT DETECTED Final   Cryptococcus neoformans/gattii NOT DETECTED NOT DETECTED Final   Vancomycin resistance NOT DETECTED NOT DETECTED Final    Comment: Performed at [facility], [address], [place], [place] [address]  Urine Culture     Status: Abnormal (Preliminary result)   Collection Time: 05/26/23  4:59 AM   Specimen: Urine, Catheterized  Result Value Ref Range Status   Specimen Description URINE, CATHETERIZED  Final   Special Requests NONE Reflexed from [id number]  Final   Culture (A)  Final    >=100,000 COLONIES/mL ENTEROCOCCUS AVIUM >=100,000 COLONIES/mL PSEUDOMONAS AERUGINOSA SUSCEPTIBILITIES TO FOLLOW Performed at [facility], [address], [place], [place] [address]    Report Status PENDING  Incomplete  Resp panel by RT-PCR (RSV, Flu A&B, Covid) Anterior Nasal Swab     Status: None   Collection Time: 05/26/23  5:53 AM   Specimen: Anterior Nasal Swab  Result Value Ref Range Status   SARS Coronavirus 2 by RT PCR NEGATIVE NEGATIVE Final   Influenza A by PCR NEGATIVE NEGATIVE Final   Influenza B by PCR NEGATIVE NEGATIVE Final    Comment: (NOTE) The Xpert Xpress SARS-CoV-2/FLU/RSV plus assay is intended as an aid in the diagnosis of influenza from  Nasopharyngeal swab specimens and should not be used as a sole basis for treatment. Nasal washings and aspirates are unacceptable for Xpert Xpress SARS-CoV-2/FLU/RSV testing.  Fact Sheet for Patients: BloggerCourse.com  Fact Sheet for Healthcare Providers: SeriousBroker.it  This test is not yet approved or cleared by the [place] FDA and has been authorized for detection and/or diagnosis of SARS-CoV-2 by FDA under an Emergency Use Authorization (EUA). This EUA will remain in effect (meaning this test can be used) for the duration of the COVID-19 declaration under Section 564(b)(1) of the Act, 21 U.S.C. section 360bbb-3(b)(1), unless the authorization is terminated or revoked.     Resp Syncytial Virus by PCR NEGATIVE NEGATIVE Final    Comment: (NOTE) Fact Sheet for Patients: BloggerCourse.com  Fact Sheet for Healthcare Providers: SeriousBroker.it  This test is not yet approved or cleared by the [place] FDA and has been authorized for detection and/or diagnosis of SARS-CoV-2 by FDA under an Emergency Use Authorization (EUA). This EUA will remain in effect (meaning this test can be used) for the duration of the COVID-19 declaration under Section 564(b)(1) of the Act, 21 U.S.C. section 360bbb-3(b)(1), unless the authorization is terminated or revoked.  Performed at [facility], [address] [place], [place] [address]   Culture, blood (Routine X 2) w Reflex to ID Panel     Status: None (Preliminary result)   Collection Time: 05/27/23  9:41 AM   Specimen: BLOOD RIGHT ARM  Result Value Ref Range Status   Specimen Description BLOOD RIGHT ARM  Final   Special Requests   Final    BOTTLES DRAWN AEROBIC AND ANAEROBIC Blood Culture adequate volume   Culture   Final    NO GROWTH < 24 HOURS Performed at [facility], [address] [place], [place] [address]     Report Status PENDING  Incomplete  Culture, blood (Routine X 2) w Reflex to ID Panel     Status: None (Preliminary result)   Collection Time: 05/27/23  9:42 AM   Specimen: BLOOD RIGHT HAND  Result Value Ref Range Status   Specimen Description BLOOD  RIGHT HAND  Final   Special Requests   Final    BOTTLES DRAWN AEROBIC AND ANAEROBIC Blood Culture adequate volume   Culture   Final    NO GROWTH < 24 HOURS Performed at [facility], [address], [place], [place] [address]    Report Status PENDING  Incomplete   Radiology Studies: No results found.  Scheduled Meds:  Chlorhexidine Gluconate Cloth  6 each Topical Daily   enoxaparin (LOVENOX) injection  40 mg Subcutaneous Daily   escitalopram  20 mg Oral QHS   feeding supplement  237 mL Oral BID BM   risperiDONE  1 mg Oral QHS   sodium chloride flush  3 mL Intravenous Q12H   traZODone  100 mg Oral Daily   Continuous Infusions:  piperacillin-tazobactam (ZOSYN)  IV 3.375 g (05/28/23 1221)     LOS: 2 days    Time spent: 35 mins    Willeen Niece, MD [facility]   If 7PM-7AM, please contact night-coverage

## 2023-05-29 DIAGNOSIS — R410 Disorientation, unspecified: Secondary | ICD-10-CM | POA: Diagnosis not present

## 2023-05-29 DIAGNOSIS — T839XXA Unspecified complication of genitourinary prosthetic device, implant and graft, initial encounter: Secondary | ICD-10-CM | POA: Diagnosis not present

## 2023-05-29 DIAGNOSIS — Z515 Encounter for palliative care: Secondary | ICD-10-CM | POA: Diagnosis not present

## 2023-05-29 DIAGNOSIS — G309 Alzheimer's disease, unspecified: Secondary | ICD-10-CM

## 2023-05-29 DIAGNOSIS — Z7189 Other specified counseling: Secondary | ICD-10-CM

## 2023-05-29 DIAGNOSIS — N390 Urinary tract infection, site not specified: Secondary | ICD-10-CM | POA: Diagnosis not present

## 2023-05-29 DIAGNOSIS — R7881 Bacteremia: Secondary | ICD-10-CM | POA: Insufficient documentation

## 2023-05-29 LAB — URINE CULTURE

## 2023-05-29 LAB — CULTURE, BLOOD (SINGLE)

## 2023-05-29 MED ORDER — ORAL CARE MOUTH RINSE: 15.0000 mL | OROMUCOSAL | Status: DC | PRN

## 2023-05-29 MED ORDER — TRAZODONE HCL 50 MG PO TABS
100.0000 mg | ORAL_TABLET | Freq: Every day | ORAL | Status: DC
  Administered 2023-05-30 – 2023-06-01 (×3): 100 mg via ORAL
  Filled 2023-05-29 (×3): qty 2

## 2023-05-29 MED ORDER — SODIUM CHLORIDE 0.9 % IV SOLN
2.0000 g | INTRAVENOUS | Status: DC
  Administered 2023-05-29 – 2023-05-30 (×7): 2 g via INTRAVENOUS
  Filled 2023-05-29 (×9): qty 2000

## 2023-05-29 NOTE — Progress Notes (Signed)
 PROGRESS NOTE    Alexander [name]  FMW:[id number] DOB: 1933-02-03 DOA: 05/25/2023 PCP: [name], MD   Brief Narrative:  This 89 yrs. old male with medical history significant of hypertension, hyperlipidemia, nonobstructive CAD, COPD, BPH with chronic indwelling Foley, depression, and severe dementia currently on hospice who presented to the ED due to his Foley catheter not draining urine properly. He is accompanied by his daughter and son-in-law who provided his history.  Patient has enlarged prostate, and has been experiencing UTI almost every month.  Daughter reports his urine appeared concerning for infection, he has been having shaking and hallucinations which is not usual for him.  He is found to have fever of 103 in the ED.  Last UTI was in August with Klebsiella pneumonia identified in culture.  UA significant for many bacteria, leukocytes WBCs.  Blood and urine cultures were obtained.  Urology was consulted for difficult Foley insertion.  Patient was empirically started on antibiotics for suspected sepsis secondary to complicated UTI.   Assessment & Plan:   Principal Problem:   Complicated UTI (urinary tract infection) Active Problems:   Sepsis (HCC)   Complication of Foley catheter (HCC)   Normocytic anemia   Hyperkalemia   Moderate dementia with behavioral disturbance   Bacteremia due to Enterococcus  Sepsis secondary to complicated UTI: Enterococcus Bacteremia : Patient has chronic indwelling Foley catheter in place.   He was noted to be febrile in the ED with 103F, tachycardia meeting SIRS criteria. UA after replacement of Foley catheter noted large hemoglobin,  leukocytes,  many bacteria and WBC. Lactic acid was reassuring at 1.5.  Urine and blood culture was obtained.   Prior cultures from 11/2022 grew out Klebsiella pneumonia that appears sensitive to Rocephin .   Patient initiated on empiric antibiotics  (metronidazole , vancomycin , and cefepime ). De-escalate  antibiotics as cultures comes back. RVP panel negative.  Blood cultures grew Enterococcus faecalis. Urine culture grew enterococci and Pseudomonas.  Antibiotics changed to zosyn . Repeat blood cultures are ordered, ID consulted. Antibiotics changed to ampicillin , zosyn  discontinued. Obtain TTE, follow up repeat blood cultures.  UTI due to clogged Foley catheter: Hematuria > Resolved. Patient presented due to hematuria and no urine output. Urology had been formally consulted and replaced a Foley catheter while in the emergency department. Follow-up with urology for possible suprapubic catheter outpatient. Hematuria resolved.  Urine has been clear , draining well. Appreciate urology consultative services.   Normocytic anemia: Chronic.  Baseline hemoglobin remains around 11.0. Hemoglobin is dropped to 8.7, urine is clear. There is no other visible signs of any active bleeding. Monitor H&H daily   Hyperkalemia: Potassium normalized.   Dementia with behavioral disturbance: Daughter noted that patient had been having hallucinations and been more altered than normal.   Suspect urinary tract infection possibly cause for symptoms. -Delirium precautions -Continue Risperdal  and Ativan  as needed.  Goals of care: Patient is in hospice at home presented with UTI.   Family wanted full code. Palliative care consulted to clarify goals of care.   DVT prophylaxis: Lovenox  Code Status: Full code Family Communication: No family at bedside Disposition Plan:    Status is: Inpatient Remains inpatient appropriate because: Admitted for complicated UTI,  leading to sepsis. Blood cultures positive for Enterococcus, ID consulted.    Consultants:  Urology Infectious disease Palliative care  Procedures: None  Antimicrobials:  Anti-infectives (From admission, onward)    Start     Dose/Rate Route Frequency Ordered Stop   05/29/23 1400  ampicillin  (OMNIPEN) 2  g in sodium chloride  0.9 % 100 mL  IVPB        2 g 300 mL/hr over 20 Minutes Intravenous Every 4 hours 05/29/23 0940     05/28/23 1100  piperacillin -tazobactam (ZOSYN ) IVPB 3.375 g  Status:  Discontinued        3.375 g 12.5 mL/hr over 240 Minutes Intravenous Every 8 hours 05/28/23 1001 05/29/23 0940   05/27/23 0315  ampicillin  (OMNIPEN) 2 g in sodium chloride  0.9 % 100 mL IVPB  Status:  Discontinued        2 g 300 mL/hr over 20 Minutes Intravenous Every 4 hours 05/27/23 0220 05/28/23 1001   05/26/23 1200  cefTRIAXone  (ROCEPHIN ) 2 g in sodium chloride  0.9 % 100 mL IVPB  Status:  Discontinued        2 g 200 mL/hr over 30 Minutes Intravenous Every 24 hours 05/26/23 0749 05/27/23 0220   05/26/23 0500  ceFEPIme  (MAXIPIME ) 2 g in sodium chloride  0.9 % 100 mL IVPB        2 g 200 mL/hr over 30 Minutes Intravenous  Once 05/26/23 0458 05/26/23 0601   05/26/23 0500  metroNIDAZOLE  (FLAGYL ) IVPB 500 mg        500 mg 100 mL/hr over 60 Minutes Intravenous  Once 05/26/23 0458 05/26/23 0559   05/26/23 0500  vancomycin  (VANCOCIN ) IVPB 1000 mg/200 mL premix        1,000 mg 200 mL/hr over 60 Minutes Intravenous  Once 05/26/23 0458 05/26/23 0651       Subjective: Patient was seen and examined at bedside. Overnight events noted. Patient appears very deconditioned, following partial commands,  denies any pain.   Objective: Vitals:   05/28/23 1247 05/28/23 1727 05/28/23 2120 05/29/23 0445  BP: 121/72 119/66 103/68 108/63  Pulse: 85 (!) 58 70 63  Resp: 18 19 18 18   Temp: 99 F (37.2 C)  98.6 F (37 C) 98.1 F (36.7 C)  TempSrc: Axillary   Oral  SpO2: 100% 100% 100% 100%  Weight:      Height:        Intake/Output Summary (Last 24 hours) at 05/29/2023 1210 Last data filed at 05/29/2023 0550 Gross per 24 hour  Intake 460 ml  Output 1300 ml  Net -840 ml   Filed Weights   05/25/23 2155  Weight: 65.1 kg    Examination:  General exam: Appears comfortable, deconditioned, sick looking, not in any acute distress. Respiratory  system: CTA bilaterally. Respiratory effort normal.  RR 16 Cardiovascular system: S1 & S2 heard, RRR. No JVD, murmurs, rubs, gallops or clicks.  Gastrointestinal system: Abdomen is non distended, soft and non tender.  Normal bowel sounds heard. Central nervous system: Alert and oriented x 1. No focal neurological deficits. Extremities: No edema, no cyanosis, no clubbing Skin: No rashes, lesions or ulcers Psychiatry: Mood & affect appropriate.     Data Reviewed: I have personally reviewed following labs and imaging studies  CBC: Recent Labs  Lab 05/25/23 2228 05/27/23 0615 05/27/23 1508  WBC 10.3 10.2  --   HGB 11.0* 8.7* 8.9*  HCT 35.3* 27.1* 28.4*  MCV 97.5 97.5  --   PLT 279 189  --    Basic Metabolic Panel: Recent Labs  Lab 05/25/23 2228 05/26/23 0430 05/27/23 0615  NA 140 140 140  K 4.3 5.7* 4.1  CL 101 103 104  CO2 29 28 32  GLUCOSE 119* 96 80  BUN 16 18 17   CREATININE 0.77 0.80 0.82  CALCIUM  9.6  8.9 8.5*   GFR: Estimated Creatinine Clearance: 57.3 mL/min (by C-G formula based on SCr of 0.82 mg/dL). Liver Function Tests: Recent Labs  Lab 05/26/23 0430  AST 34  ALT 14  ALKPHOS 48  BILITOT 1.0  PROT 5.2*  ALBUMIN 3.1*   No results for input(s): LIPASE, AMYLASE in the last 168 hours. No results for input(s): AMMONIA in the last 168 hours. Coagulation Profile: Recent Labs  Lab 05/26/23 0510  INR 1.2   Cardiac Enzymes: No results for input(s): CKTOTAL, CKMB, CKMBINDEX, TROPONINI in the last 168 hours. BNP (last 3 results) No results for input(s): PROBNP in the last 8760 hours. HbA1C: No results for input(s): HGBA1C in the last 72 hours. CBG: No results for input(s): GLUCAP in the last 168 hours. Lipid Profile: No results for input(s): CHOL, HDL, LDLCALC, TRIG, CHOLHDL, LDLDIRECT in the last 72 hours. Thyroid  Function Tests: No results for input(s): TSH, T4TOTAL, FREET4, T3FREE, THYROIDAB in the last 72  hours. Anemia Panel: No results for input(s): VITAMINB12, FOLATE, FERRITIN, TIBC, IRON, RETICCTPCT in the last 72 hours. Sepsis Labs: Recent Labs  Lab 05/26/23 0512 05/26/23 0837  LATICACIDVEN 1.5 1.1    Recent Results (from the past 240 hours)  Culture, blood (single)     Status: Abnormal   Collection Time: 05/26/23  4:50 AM   Specimen: BLOOD  Result Value Ref Range Status   Specimen Description BLOOD SITE NOT SPECIFIED  Final   Special Requests   Final    BOTTLES DRAWN AEROBIC AND ANAEROBIC Blood Culture results may not be optimal due to an inadequate volume of blood received in culture bottles   Culture  Setup Time   Final    GRAM POSITIVE COCCI IN BOTH AEROBIC AND ANAEROBIC BOTTLES CRITICAL RESULT CALLED TO, READ BACK BY AND VERIFIED WITH: J WYLAND,PHARMD@0200  05/27/23 MK Performed at [facility], [address], [place], [place] [address]    Culture ENTEROCOCCUS AVIUM ENTEROCOCCUS FAECALIS  (A)  Final   Report Status 05/29/2023 FINAL  Final   Organism ID, Bacteria ENTEROCOCCUS AVIUM  Final   Organism ID, Bacteria ENTEROCOCCUS FAECALIS  Final      Susceptibility   Enterococcus avium - MIC*    AMPICILLIN  <=2 SENSITIVE Sensitive     VANCOMYCIN  <=0.5 SENSITIVE Sensitive     GENTAMICIN SYNERGY SENSITIVE Sensitive     * ENTEROCOCCUS AVIUM   Enterococcus faecalis - MIC*    AMPICILLIN  <=2 SENSITIVE Sensitive     VANCOMYCIN  1 SENSITIVE Sensitive     GENTAMICIN SYNERGY SENSITIVE Sensitive     * ENTEROCOCCUS FAECALIS  Blood Culture ID Panel (Reflexed)     Status: Abnormal   Collection Time: 05/26/23  4:50 AM  Result Value Ref Range Status   Enterococcus faecalis DETECTED (A) NOT DETECTED Final    Comment: CRITICAL RESULT CALLED TO, READ BACK BY AND VERIFIED WITH: J WYLAND,PHARMD@0200  05/27/23 MK    Enterococcus Faecium NOT DETECTED NOT DETECTED Final   Listeria monocytogenes NOT DETECTED NOT DETECTED Final   Staphylococcus species NOT DETECTED NOT  DETECTED Final   Staphylococcus aureus (BCID) NOT DETECTED NOT DETECTED Final   Staphylococcus epidermidis NOT DETECTED NOT DETECTED Final   Staphylococcus lugdunensis NOT DETECTED NOT DETECTED Final   Streptococcus species NOT DETECTED NOT DETECTED Final   Streptococcus agalactiae NOT DETECTED NOT DETECTED Final   Streptococcus pneumoniae NOT DETECTED NOT DETECTED Final   Streptococcus pyogenes NOT DETECTED NOT DETECTED Final   A.calcoaceticus-baumannii NOT DETECTED NOT DETECTED Final  Bacteroides fragilis NOT DETECTED NOT DETECTED Final   Enterobacterales NOT DETECTED NOT DETECTED Final   Enterobacter cloacae complex NOT DETECTED NOT DETECTED Final   Escherichia coli NOT DETECTED NOT DETECTED Final   Klebsiella aerogenes NOT DETECTED NOT DETECTED Final   Klebsiella oxytoca NOT DETECTED NOT DETECTED Final   Klebsiella pneumoniae NOT DETECTED NOT DETECTED Final   Proteus species NOT DETECTED NOT DETECTED Final   Salmonella species NOT DETECTED NOT DETECTED Final   Serratia marcescens NOT DETECTED NOT DETECTED Final   Haemophilus influenzae NOT DETECTED NOT DETECTED Final   Neisseria meningitidis NOT DETECTED NOT DETECTED Final   Pseudomonas aeruginosa NOT DETECTED NOT DETECTED Final   Stenotrophomonas maltophilia NOT DETECTED NOT DETECTED Final   Candida albicans NOT DETECTED NOT DETECTED Final   Candida auris NOT DETECTED NOT DETECTED Final   Candida glabrata NOT DETECTED NOT DETECTED Final   Candida krusei NOT DETECTED NOT DETECTED Final   Candida parapsilosis NOT DETECTED NOT DETECTED Final   Candida tropicalis NOT DETECTED NOT DETECTED Final   Cryptococcus neoformans/gattii NOT DETECTED NOT DETECTED Final   Vancomycin  resistance NOT DETECTED NOT DETECTED Final    Comment: Performed at [facility], [address] [place], [place] [address]  Urine Culture     Status: Abnormal   Collection Time: 05/26/23  4:59 AM   Specimen: Urine, Catheterized  Result Value Ref Range  Status   Specimen Description URINE, CATHETERIZED  Final   Special Requests   Final    NONE Reflexed from [id number] Performed at [facility], [address] [place], [place] [address]    Culture (A)  Final    >=100,000 COLONIES/mL ENTEROCOCCUS AVIUM >=100,000 COLONIES/mL PSEUDOMONAS AERUGINOSA    Report Status 05/29/2023 FINAL  Final   Organism ID, Bacteria ENTEROCOCCUS AVIUM (A)  Final   Organism ID, Bacteria PSEUDOMONAS AERUGINOSA (A)  Final      Susceptibility   Enterococcus avium - MIC*    AMPICILLIN  <=2 SENSITIVE Sensitive     NITROFURANTOIN 64 INTERMEDIATE Intermediate     VANCOMYCIN  <=0.5 SENSITIVE Sensitive     * >=100,000 COLONIES/mL ENTEROCOCCUS AVIUM   Pseudomonas aeruginosa - MIC*    CEFTAZIDIME 8 SENSITIVE Sensitive     CIPROFLOXACIN <=0.25 SENSITIVE Sensitive     GENTAMICIN 2 SENSITIVE Sensitive     IMIPENEM 2 SENSITIVE Sensitive     * >=100,000 COLONIES/mL PSEUDOMONAS AERUGINOSA  Resp panel by RT-PCR (RSV, Flu A&B, Covid) Anterior Nasal Swab     Status: None   Collection Time: 05/26/23  5:53 AM   Specimen: Anterior Nasal Swab  Result Value Ref Range Status   SARS Coronavirus 2 by RT PCR NEGATIVE NEGATIVE Final   Influenza A by PCR NEGATIVE NEGATIVE Final   Influenza B by PCR NEGATIVE NEGATIVE Final    Comment: (NOTE) The Xpert Xpress SARS-CoV-2/FLU/RSV plus assay is intended as an aid in the diagnosis of influenza from Nasopharyngeal swab specimens and should not be used as a sole basis for treatment. Nasal washings and aspirates are unacceptable for Xpert Xpress SARS-CoV-2/FLU/RSV testing.  Fact Sheet for Patients: bloggercourse.com  Fact Sheet for Healthcare Providers: seriousbroker.it  This test is not yet approved or cleared by the [place]  FDA and has been authorized for detection and/or diagnosis of SARS-CoV-2 by FDA under an Emergency Use Authorization (EUA). This EUA will remain in effect  (meaning this test can be used) for the duration of the COVID-19 declaration under Section 564(b)(1) of the Act, 21  U.S.C. section 360bbb-3(b)(1), unless the authorization is terminated or revoked.     Resp Syncytial Virus by PCR NEGATIVE NEGATIVE Final    Comment: (NOTE) Fact Sheet for Patients: bloggercourse.com  Fact Sheet for Healthcare Providers: seriousbroker.it  This test is not yet approved or cleared by the [place]  FDA and has been authorized for detection and/or diagnosis of SARS-CoV-2 by FDA under an Emergency Use Authorization (EUA). This EUA will remain in effect (meaning this test can be used) for the duration of the COVID-19 declaration under Section 564(b)(1) of the Act, 21 U.S.C. section 360bbb-3(b)(1), unless the authorization is terminated or revoked.  Performed at [facility], [address] [place], [place] [address]   Culture, blood (Routine X 2) w Reflex to ID Panel     Status: None (Preliminary result)   Collection Time: 05/27/23  9:41 AM   Specimen: BLOOD RIGHT ARM  Result Value Ref Range Status   Specimen Description BLOOD RIGHT ARM  Final   Special Requests   Final    BOTTLES DRAWN AEROBIC AND ANAEROBIC Blood Culture adequate volume   Culture   Final    NO GROWTH 2 DAYS Performed at [facility], [address], [place], [place] [address]    Report Status PENDING  Incomplete  Culture, blood (Routine X 2) w Reflex to ID Panel     Status: None (Preliminary result)   Collection Time: 05/27/23  9:42 AM   Specimen: BLOOD RIGHT HAND  Result Value Ref Range Status   Specimen Description BLOOD RIGHT HAND  Final   Special Requests   Final    BOTTLES DRAWN AEROBIC AND ANAEROBIC Blood Culture adequate volume   Culture   Final    NO GROWTH 2 DAYS Performed at [facility], [address] [place], [place] [address]    Report Status PENDING  Incomplete   Radiology Studies: No  results found.  Scheduled Meds:  Chlorhexidine  Gluconate Cloth  6 each Topical Daily   enoxaparin  (LOVENOX ) injection  40 mg Subcutaneous Daily   escitalopram   20 mg Oral QHS   feeding supplement  237 mL Oral BID BM   risperiDONE   1 mg Oral QHS   sodium chloride  flush  3 mL Intravenous Q12H   [START ON 05/30/2023] traZODone   100 mg Oral QHS   Continuous Infusions:  ampicillin  (OMNIPEN) IV       LOS: 3 days    Time spent: 35 mins    Darcel Dawley, MD [facility]   If 7PM-7AM, please contact night-coverage

## 2023-05-29 NOTE — Plan of Care (Signed)
  Problem: Clinical Measurements: Goal: Ability to maintain clinical measurements within normal limits will improve Outcome: Progressing Goal: Will remain free from infection Outcome: Progressing Goal: Diagnostic test results will improve Outcome: Progressing Goal: Respiratory complications will improve Outcome: Progressing Goal: Cardiovascular complication will be avoided Outcome: Progressing   Problem: Coping: Goal: Level of anxiety will decrease Outcome: Progressing   Problem: Elimination: Goal: Will not experience complications related to urinary retention Outcome: Progressing   Problem: Pain Managment: Goal: General experience of comfort will improve and/or be controlled Outcome: Progressing   Problem: Safety: Goal: Ability to remain free from injury will improve Outcome: Progressing   Problem: Skin Integrity: Goal: Risk for impaired skin integrity will decrease Outcome: Progressing   Problem: Education: Goal: Knowledge of General Education information will improve Description: Including pain rating scale, medication(s)/side effects and non-pharmacologic comfort measures Outcome: Not Progressing   Problem: Health Behavior/Discharge Planning: Goal: Ability to manage health-related needs will improve Outcome: Not Progressing   Problem: Activity: Goal: Risk for activity intolerance will decrease Outcome: Not Progressing   Problem: Nutrition: Goal: Adequate nutrition will be maintained Outcome: Not Progressing  Sleeping a lot today. Dementia limits education. Denies needs
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
## 2023-05-29 NOTE — TOC CM/SW Note (Signed)
 Transition of Care [facility]) - Inpatient Brief Assessment   Patient Details  Name: Alexander [name] MRN: [id number] Date of Birth: 1932/05/02  Transition of Care [facility]) CM/SW Contact:    Tom-Johnson, Harvest Muskrat, RN Phone Number: 05/29/2023, 3:54 PM   Clinical Narrative:  Patient presented to the ED with Increased Confusion, Hematuria and decreased Urinary output. Patient has an indwelling Foley Catheter. Patient admitted with suspected Sepsis 2/2 Complicated UTI. Currently on IV abx. Patient also on 2L O2 acute, does not use home O2.  Patient is   From home with daughter, active with Hospice services with [facility] and will resume at discharge. PTAR will be scheduled to transport at discharge.   Patient not Medically ready for discharge.  CM will continue to follow as patient progresses with care towards discharge.             Transition of Care Asessment: Insurance and Status: Insurance coverage has been reviewed Patient has primary care physician: Yes Home environment has been reviewed: Yes Prior level of function:: Dependent Prior/Current Home Services: Current home services (Hospice) Social Drivers of Health Review: SDOH reviewed no interventions necessary Readmission risk has been reviewed: Yes Transition of care needs: transition of care needs identified, TOC will continue to follow

## 2023-05-29 NOTE — Progress Notes (Signed)
 Physical Therapy Treatment Patient Details Name: Alexander [name] MRN: [id number] DOB: 1932/10/01 Today's Date: 05/29/2023   History of Present Illness The pt is an 89 yo male presenting to [facility] with AMS and blood in urine after attempted indwelling catheter change. Admitted with sepsis and possible UTI. PMH includes: BPH, CAD, HTN, CHF, COPD, and Alzheimer's dementia on hospice care at home.    PT Comments  Continuing work on functional mobility and activity tolerance;  Initiated session with intent to help with ambulation and transfers, however, pt sleeping and difficult to arouse; Opted to help pt to EOB and look for possibility that he would wake up more once upright; once sitting EOB, pt activlely pulling to lay back dow to his R; assisted back to supine, bed alarm set, and placed bed in semi-chair position   If plan is discharge home, recommend the following: Two people to help with walking and/or transfers;Two people to help with bathing/dressing/bathroom   Can travel by Doctor, Hospital (measurements PT);Wheelchair cushion (measurements PT)    Recommendations for Other Services       Precautions / Restrictions Precautions Precautions: Fall Precaution Comments: dementia     Mobility  Bed Mobility Overal bed mobility: Needs Assistance Bed Mobility: Supine to Sit, Sit to Supine     Supine to sit: +2 for physical assistance, Total assist Sit to supine: +2 for physical assistance, Total assist   General bed mobility comments: Total assist for all aspects of bed mobilty; Pt's eyes opened briefly once sitting up; heavy pushing to the right (towards pillow) and resistive to manual cues for upright posture; total assist to lay back down    Transfers                        Ambulation/Gait                   Stairs             Wheelchair Mobility     Tilt Bed    Modified Rankin (Stroke Patients  Only)       Balance     Sitting balance-Leahy Scale: Zero   Postural control: Other (comment) (seemed to be pushing to lay back down)                                  Cognition Arousal: Lethargic Behavior During Therapy: Flat affect Overall Cognitive Status: History of cognitive impairments - at baseline                                 General Comments: Very sleepy this session        Exercises      General Comments General comments (skin integrity, edema, etc.): Session conducted on supplemental O2 2 L      Pertinent Vitals/Pain Pain Assessment Pain Assessment: Faces Faces Pain Scale: No hurt Pain Intervention(s): Monitored during session    Home Living                          Prior Function            PT Goals (current goals can now be found in the care plan section) Acute Rehab PT Goals Patient Stated  Goal: none stated, no family present PT Goal Formulation: Patient unable to participate in goal setting Time For Goal Achievement: 06/10/23 Potential to Achieve Goals: Fair Progress towards PT goals: Not progressing toward goals - comment (Lethargic today)    Frequency    Min 1X/week      PT Plan      Co-evaluation              AM-PAC PT 6 Clicks Mobility   Outcome Measure  Help needed turning from your back to your side while in a flat bed without using bedrails?: Total Help needed moving from lying on your back to sitting on the side of a flat bed without using bedrails?: Total Help needed moving to and from a bed to a chair (including a wheelchair)?: Total Help needed standing up from a chair using your arms (e.g., wheelchair or bedside chair)?: Total Help needed to walk in hospital room?: Total Help needed climbing 3-5 steps with a railing? : Total 6 Click Score: 6    End of Session Equipment Utilized During Treatment: Oxygen Activity Tolerance: Patient limited by lethargy Patient left: in  bed;with call bell/phone within reach;with bed alarm set Nurse Communication: Mobility status PT Visit Diagnosis: Unsteadiness on feet (R26.81);Muscle weakness (generalized) (M62.81)     Time: 9088-9070 PT Time Calculation (min) (ACUTE ONLY): 18 min  Charges:    $Therapeutic Activity: 8-22 mins PT General Charges $$ ACUTE PT VISIT: 1 Visit                     Silvano Currier, PT  Acute Rehabilitation Services Office [phone] Secure Chat welcomed    Silvano VEAR Currier 05/29/2023, 1:25 PM

## 2023-05-29 NOTE — Progress Notes (Signed)
 [facility] [phone] [facility] Liaison Note?     Alexander [name] is a current [facility] hospice patient with a terminal diagnosis of abnormal weight loss. He experienced urine not draining properly along with pain and hematuria. EMS was called to assist and took him to the ED. He was admitted on 2.1.25 with concerns for possible UTI and blocked catheter. Per Dr. Norleen Laurence with [facility] this is a related hospital admission.    Visited patient at bedside. He was soundly sleeping so I did not wake him. Spoke with daughter by phone who has questions about infectious disease consult today.    Patient is inpatient appropriate due to need for IV antibiotics and close monitoring for effectiveness of interventions for suspected sepsis secondary to complicated UTI.    Vital Signs:?98.1/63/18    108/63    100% on 2lpm [place]   I/O:  460/1,600   Abnormal labs: none new   Diagnostics: none new   IV/PRN Meds: Zosyn  3.375 g IV q 8hrs x3,  Omnipen 2g IV      Problem list per MD note Leotis Bogus, MD 2.4.25: Assessment & Plan:   Principal Problem:   Complicated UTI (urinary tract infection) Active Problems:   Sepsis (HCC)   Complication of Foley catheter (HCC)   Normocytic anemia   Hyperkalemia   Moderate dementia with behavioral disturbance   Bacteremia due to Enterococcus   Sepsis secondary to complicated UTI: Enterococcus Bacteremia : Patient has chronic indwelling Foley catheter in place.   He was noted to be febrile in the ED with 103F, tachycardia meeting SIRS criteria. UA after replacement of Foley catheter noted large hemoglobin,  leukocytes,  many bacteria and WBC. Lactic acid was reassuring at 1.5.  Urine and blood culture was obtained.   Prior cultures from 11/2022 grew out Klebsiella pneumonia that appears sensitive to Rocephin .   Patient initiated on empiric antibiotics  (metronidazole , vancomycin , and cefepime ). De-escalate antibiotics as cultures comes  back. RVP panel negative.  Blood cultures grew Enterococcus faecalis. Urine culture grew enterococci and Pseudomonas.  Antibiotics changed to zosyn . Repeat blood cultures are ordered, ID consulted. Antibiotics changed to ampicillin , zosyn  discontinued. Obtain TTE, follow up repeat blood cultures.   UTI due to clogged Foley catheter: Hematuria > Resolved. Patient presented due to hematuria and no urine output. Urology had been formally consulted and replaced a Foley catheter while in the emergency department. Follow-up with urology for possible suprapubic catheter outpatient. Hematuria resolved.  Urine has been clear , draining well. Appreciate urology consultative services.   Normocytic anemia: Chronic.  Baseline hemoglobin remains around 11.0. Hemoglobin is dropped to 8.7, urine is clear. There is no other visible signs of any active bleeding. Monitor H&H daily   Hyperkalemia: Potassium normalized.   Dementia with behavioral disturbance: Daughter noted that patient had been having hallucinations and been more altered than normal.   Suspect urinary tract infection possibly cause for symptoms. -Delirium precautions -Continue Risperdal  and Ativan  as needed.   Discharge Planning:? Discharge back home once medically stable   Family contact: Spoke with daughter by phone  IDT:? Updated?     Goals of Care: Full Code   If patient requires EMS transport at discharge, please us  [facility] as that is who [facility] is contracted with for transport.   Please call with any hospice related questions or concerns.   [facility] hospital liaison [phone]

## 2023-05-29 NOTE — Progress Notes (Signed)
 Daily Progress Note   Patient Name: Alexander [name]       Date: 05/29/2023 DOB: 06-25-1932  Age: 89 y.o. MRN#: [id number] Attending Physician: Alexander Bogus, MD Primary Care Physician: Alexander Toribio MATSU, MD Admit Date: 05/25/2023  Reason for Consultation/Follow-up: Establishing goals of care  Subjective: I have reviewed medical records including EPIC notes, MAR, any available advanced directives as necessary, and labs. Received report from primary RN - no acute concerns.  Went to visit patient at bedside - no family/visitors present. Patient was lying in bed asleep - I did not attempt to wake him to preserve comfort. No signs or non-verbal gestures of pain or discomfort noted. No respiratory distress, increased work of breathing, or secretions noted. He is on 2L O2 [place].  8:58 AM Attempted to call daughter/HCPOA/Alexander [name] to discuss diagnosis, prognosis, GOC, EOL wishes, disposition, and options - no answer - confidential voicemail left and PMT phone number provided with request to return call.  10:08 AM Received notification Alexander [name] returned PMT call.  Met with Alexander [name] via phone  to discuss diagnosis, prognosis, GOC, EOL wishes, disposition, and options.  I introduced Palliative Medicine as specialized medical care for people living with serious illness. It focuses on providing relief from the symptoms and stress of a serious illness. The goal is to improve quality of life for both the patient and the family.  We discussed a brief life review of the patient as well as functional and nutritional status. [name] passed away last year - he has 4 biological children and 3 step children. Prior to hospitalization, he was living in a private residence with his daughter/Alexander [name] and her husband. Alexander [name]  and her husband are patient's primary caregivers. Alexander [name] tells me, he can't walk well anymore and outlines his slow and gradual functional decline. Patient has private caregivers a couple of hours per day in addition to home hospice services. Per Alexander [name], patient loves to eat. Albumin noted at 3.1 on 05/26/23. Alexander [name] first noticed a drastic decline in his health this past Friday. After discussing her concerns with hospice staff, it was decided to bring patient to ER with assistance for malfunctioning foley catheter.   We discussed patient's current illness and what it means in the larger context of patient's on-going co-morbidities. Alexander [name] has a clear understanding of patient's current acute medical situation.  Natural disease trajectory and expectations at EOL were discussed. I attempted to elicit values and goals of care important to the patient. The difference between aggressive medical intervention and comfort care was considered in light of the patient's goals of care.   At this time, goal is to continue treating the treatable while in house - she would like to gain insight from ID and treat the UTI. At discharge, goal is for continued home hospice services with [facility].  Advance directives and concepts specific to code status were considered and discussed. Patient does have a HCPOA, which is Alexander [name], as well as a Living Will - requesting copies.   Lengthy discussion had regarding code status. Encouraged family to consider DNR/DNI status understanding evidenced based poor outcomes in similar hospitalized patient, as the cause of arrest is likely associated with advanced chronic/terminal illness rather than an easily reversible acute cardio-pulmonary event.  I shared that even if we pursued resuscitation we would not able to resolve the underlying factors. I explained that DNR/DNI does not change the medical plan and it only comes into effect after a person has arrested (died).  It is a protective measure  to keep us  from harming the patient in their last moments of life. Alexander [name] tells me patient has previously expressed to keep me as long as you can keep me, but if I have to live on machines, let me go. Reviewed that in the instance of needing intubation, the likelihood patient would be successfully liberated would be quite low/unlikely. Encouraged family to consider if patient would want to pursue this intervention knowing it would likely not offer quality of life or meet his stated goal to live off machines. Alexander [name] will consider this information and inform medical/hospice team if she desires to change code status. At this time, she wishes to continue full code with understanding that he would receive CPR, defibrillation, ACLS medications, or intubation.   Alexander [name] expressed concern that trazadone was scheduled in the AM and patient has not been as interactive since admission. Reviewed that could be due to infection, decline, and/or medication. Changed trazadone back to daily at bedtime as patient takes and tolerates at home.   Discussed with Alexander [name] the importance of continued conversation with other family and the medical providers regarding overall plan of care and treatment options, ensuring decisions are within the context of the patient's values and GOCs.    Questions and concerns were addressed. The patient/family was encouraged to call with questions and/or concerns. PMT card was provided.  Length of Stay: 3  Current Medications: Scheduled Meds:   Chlorhexidine  Gluconate Cloth  6 each Topical Daily   enoxaparin  (LOVENOX ) injection  40 mg Subcutaneous Daily   escitalopram   20 mg Oral QHS   feeding supplement  237 mL Oral BID BM   risperiDONE   1 mg Oral QHS   sodium chloride  flush  3 mL Intravenous Q12H   traZODone   100 mg Oral Daily    Continuous Infusions:  piperacillin -tazobactam (ZOSYN )  IV 3.375 g (05/29/23 0553)    PRN Meds: acetaminophen  **OR** acetaminophen , albuterol , LORazepam ,  ondansetron  **OR** ondansetron  (ZOFRAN ) IV, polyethylene glycol  Physical Exam Vitals and nursing note reviewed.  Constitutional:      General: He is not in acute distress.    Appearance: He is ill-appearing.  Pulmonary:     Effort: No respiratory distress.  Skin:    General: Skin is warm and dry.  Neurological:     Motor: Weakness present.  Vital Signs: BP 108/63 (BP Location: Left Arm)   Pulse 63   Temp 98.1 F (36.7 C)   Resp 18   Ht 5' 8 (1.727 m)   Wt 65.1 kg   SpO2 100%   BMI 21.82 kg/m  SpO2: SpO2: 100 % O2 Device: O2 Device: Nasal Cannula O2 Flow Rate: O2 Flow Rate (L/min): 2 L/min  Intake/output summary:  Intake/Output Summary (Last 24 hours) at 05/29/2023 0858 Last data filed at 05/29/2023 0550 Gross per 24 hour  Intake 460 ml  Output 1600 ml  Net -1140 ml   LBM: Last BM Date : 05/27/23 Baseline Weight: Weight: 65.1 kg Most recent weight: Weight: 65.1 kg       Palliative Assessment/Data: PPS 20-30%      Patient Active Problem List   Diagnosis Date Noted   Complication of Foley catheter (HCC) 05/26/2023   Hyperkalemia 05/26/2023   Complicated UTI (urinary tract infection) 03/19/2023   Acute cystitis without hematuria 02/19/2023   Pressure injury of skin 02/19/2023   Fever 02/08/2023   Acute UTI (urinary tract infection) 02/08/2023   Malnutrition of moderate degree 12/05/2022   Bacteremia due to Klebsiella pneumoniae 12/01/2022   Sepsis (HCC) 11/29/2022   COPD exacerbation (HCC) 11/29/2020   Acute hypoxemic respiratory failure (HCC) 11/29/2020   Moderate dementia with behavioral disturbance 11/29/2020   COVID-19 04/11/2020   Syncope and collapse 04/11/2020   Right shoulder pain 02/19/2019   Hematochezia 01/03/2019   Bilateral hearing loss 10/06/2018   Dementia without behavioral disturbance (HCC) 09/02/2018   Acute respiratory failure with hypoxia (HCC) 09/02/2018   Rash 05/30/2018   Blood in ear canal, left 05/21/2018    Dermatitis 04/12/2018   Preventative health care 11/27/2017   Weight loss 08/27/2017   Abdominal discomfort, generalized 08/27/2017   Normocytic anemia 08/27/2017   Hyponatremia 08/27/2017   Chronic obstructive pulmonary disease (HCC) 06/05/2017   Chronic pansinusitis 06/05/2017   BPH with obstruction/lower urinary tract symptoms 01/30/2017   Gynecomastia, male 09/06/2016   Chest pain 05/21/2016   Hyperglycemia 05/09/2016   Depression 05/09/2016   Rhinitis, chronic 05/09/2016   Upper airway cough syndrome 03/18/2016   Hypertensive heart disease    Dark stools 11/14/2015   Screening for prostate cancer 05/06/2015   Thrush 05/06/2015   DOE (dyspnea on exertion) 07/31/2014   Constipation 01/27/2014   Glaucoma    Erectile dysfunction    Left ventricular hypertrophy 12/01/2012   GERD (gastroesophageal reflux disease) 10/28/2012   COPD, moderate (HCC) 10/22/2012   HLD (hyperlipidemia) 10/11/2009   Essential hypertension 10/11/2009   Cough, persistent 10/11/2009    Palliative Care Assessment & Plan   Patient Profile: 89 y.o. male  with past medical history of hypertension, hyperlipidemia, nonobstructive CAD, COPD, BPH with chronic indwelling Foley, depression, and severe dementia was admitted on 05/25/2023 with suspected sepsis complicated urinary tract infection, complication of Foley catheter, normocytic anemia, hyperkalemia, and dementia with behavioral disturbance.    Of note, patient has had 4 admissions and 1 ED visit in the last 6 months.  He is currently enrolled in [place] [facility] hospice care.  [facility] liaison has discussed goals of care with family.  [facility] liaison requests PMT assistance to further clarify goals while admitted.  Assessment: Principal Problem:   Complicated UTI (urinary tract infection) Active Problems:   Normocytic anemia   Moderate dementia with behavioral disturbance   Sepsis (HCC)   Complication of Foley catheter (HCC)   Hyperkalemia   Concern about  end of life  Recommendations/Plan: Continue  to treat the treatable Continue full code Goal is for discharge back home with hospice once medically stable [facility] to continue to follow while admitted PMT will continue to follow peripherally. If there are any imminent needs please call the service directly  Goals of Care and Additional Recommendations: Limitations on Scope of Treatment: Full Scope Treatment  Code Status:    Code Status Orders  (From admission, onward)           Start     Ordered   05/26/23 0743  Full code  Continuous       Question:  By:  Answer:  Consent: discussion documented in EHR   05/26/23 0749           Code Status History     Date Active Date Inactive Code Status Order ID Comments User Context   03/19/2023 2322 03/24/2023 1730 Full Code [id number]  Keturah Carrier, MD ED   02/20/2023 1157 02/23/2023 1759 Full Code [id number]  Davia Nydia POUR, MD Inpatient   02/19/2023 1745 02/20/2023 1157 Limited: Do not attempt resuscitation (DNR) -DNR-LIMITED -Do Not Intubate/DNI  [id number]  Davia Nydia POUR, MD Inpatient   02/08/2023 1537 02/19/2023 1745 Full Code [id number]  Celinda Alm Lot, MD ED   11/29/2022 1703 12/05/2022 2121 Full Code [id number]  Celinda Alm Lot, MD Inpatient   11/29/2020 0601 11/30/2020 1749 Full Code [id number]  Alfornia Madison, MD ED   04/11/2020 1417 04/13/2020 1410 Full Code [id number]  Allana Gins, MD ED   09/03/2018 0140 09/03/2018 1209 Full Code [id number]  Silvester Ales, MD Inpatient   02/28/2013 1115 03/02/2013 1913 Full Code [id number]  Drusilla Sabas RAMAN, MD Inpatient       Prognosis:  < 6 months  Discharge Planning: Home with Hospice  Care plan was discussed with primary RN, patient's family, [facility] liaison, Dr. Leotis, [facility]  Thank you for allowing the Palliative Medicine Team to assist in the care of this patient.   Total Time 75 minutes Prolonged Time Billed  yes       Jeoffrey CHRISTELLA Sharps, NP  Please contact  Palliative Medicine Team phone at [phone] for questions and concerns.   *Portions of this note are a verbal dictation therefore any spelling and/or grammatical errors are due to the Dragon [employer] One system interpretation.

## 2023-05-30 ENCOUNTER — Inpatient Hospital Stay (HOSPITAL_COMMUNITY)

## 2023-05-30 DIAGNOSIS — R7881 Bacteremia: Secondary | ICD-10-CM | POA: Diagnosis not present

## 2023-05-30 DIAGNOSIS — D649 Anemia, unspecified: Secondary | ICD-10-CM | POA: Diagnosis not present

## 2023-05-30 DIAGNOSIS — N390 Urinary tract infection, site not specified: Secondary | ICD-10-CM

## 2023-05-30 DIAGNOSIS — F03B18 Unspecified dementia, moderate, with other behavioral disturbance: Secondary | ICD-10-CM | POA: Diagnosis not present

## 2023-05-30 DIAGNOSIS — E875 Hyperkalemia: Secondary | ICD-10-CM

## 2023-05-30 DIAGNOSIS — B952 Enterococcus as the cause of diseases classified elsewhere: Secondary | ICD-10-CM

## 2023-05-30 LAB — BASIC METABOLIC PANEL
Anion gap: 7 (ref 5–15)
BUN: 14 mg/dL (ref 8–23)
CO2: 33 mmol/L — ABNORMAL HIGH (ref 22–32)
Calcium: 8.4 mg/dL — ABNORMAL LOW (ref 8.9–10.3)
Chloride: 101 mmol/L (ref 98–111)
Creatinine, Ser: 0.71 mg/dL (ref 0.61–1.24)
GFR, Estimated: >60 mL/min (ref >60)
Glucose, Bld: 87 mg/dL (ref 70–99)
Potassium: 4 mmol/L (ref 3.5–5.1)
Sodium: 141 mmol/L (ref 135–145)

## 2023-05-30 LAB — HEMOGLOBIN AND HEMATOCRIT, BLOOD
HCT: 27.9 % — ABNORMAL LOW (ref 39.0–52.0)
Hemoglobin: 8.8 g/dL — ABNORMAL LOW (ref 13.0–17.0)

## 2023-05-30 LAB — ECHOCARDIOGRAM COMPLETE
AR max vel: 2.75 cm2
AV Peak grad: 8.3 mm[Hg]
Ao pk vel: 1.44 m/s
Area-P 1/2: 3.77 cm2
S' Lateral: 3.6 cm

## 2023-05-30 LAB — GLUCOSE, CAPILLARY: Glucose-Capillary: 83 mg/dL (ref 70–99)

## 2023-05-30 MED ORDER — AMOXICILLIN 500 MG PO CAPS
1000.0000 mg | ORAL_CAPSULE | Freq: Three times a day (TID) | ORAL | Status: DC
  Administered 2023-05-30 – 2023-06-02 (×8): 1000 mg via ORAL
  Filled 2023-05-30 (×8): qty 2

## 2023-05-30 NOTE — Progress Notes (Signed)
 Occupational Therapy Treatment Patient Details Name: Alexander [name] MRN: [id number] DOB: 1932/09/22 Today's Date: 05/30/2023   History of present illness The pt is an 89 yo male presenting to [facility] 1/31 with AMS and blood in urine after attempted indwelling catheter change. Admitted with sepsis and possible UTI. PMH includes: BPH, CAD, HTN, CHF, COPD, and Alzheimer's dementia on hospice care at home.   OT comments  OT session largely focused on improved B UE coordination and cognition during self-feeding with pt currently demonstrating ability ti self-feed with hand-over-hand cues for initiation fading to Supervision with cues to maintain attention to task and alertness throughout meal. Pt also demonstrating ability to wash/dry hands with wash cloth with Min assist and cues. Pt made good attempt to participate in session but was limited by decreased activity tolerance,  fluctuating level of alertness, and impaired cognition. Pt make little to no progress toward goals at this time due to overall level of alertness and impaired cognition. With increased alertness, pt has fair potential to progress toward goals. Pt will benefit from continued acute skilled OT services to address deficits below, decrease caregiver burden, and increase safety and independence with functional tasks. Post-acute recommendation based on family goals and preferences with potential for participation in [facility] OT versus returning home with hospice services pt was receiving prior to this admission. No family/caregiver available this day for further discussion of family preferences.       If plan is discharge home, recommend the following:  Two people to help with walking and/or transfers;Two people to help with bathing/dressing/bathroom;Assistance with cooking/housework;Assistance with feeding;Direct supervision/assist for medications management;Direct supervision/assist for financial management;Assist for transportation;Help with stairs  or ramp for entrance;Supervision due to cognitive status (set-up and orientation to tray for self-feeding)   Equipment Recommendations  Other (comment) (Red plates, bowls, utensils, and cups for improved awareness of items at meal time (or other high-contrast color if home table is red))    Recommendations for Other Services      Precautions / Restrictions Precautions Precautions: Fall Precaution Comments: dementia Restrictions Weight Bearing Restrictions Per Provider Order: No       Mobility Bed Mobility Overal bed mobility: Needs Assistance             General bed mobility comments: Max assist to reposition in midline in the bed    Transfers                   General transfer comment: not addressed this session for pt/therapist safety due to pt with fluctuating level of alertness     Balance                                           ADL either performed or assessed with clinical judgement   ADL Overall ADL's : Needs assistance/impaired Eating/Feeding: Minimal assistance;Cueing for sequencing;Bed level;Supervision/ safety Eating/Feeding Details (indicate cue type and reason): with bed in chair position; pt required hand-over-hand assist to initate task then required verbal cues and occasional tactile cues to continue eating with Supervision; requirese orientation to tray Grooming: Wash/dry hands;Minimal assistance;Cueing for sequencing;Bed level (cues for initiation) Grooming Details (indicate cue type and reason): with bed in chair position                               General ADL Comments:  Pt eating 6 bites of food and drinking about a half a cup of juice through a straw before saying he was done with all food and drink. OT confirmed with pt continuing to state he was done. Pt with poor activity tolerance, fatiguing quickly during meal.    Extremity/Trunk Assessment Upper Extremity Assessment Upper Extremity Assessment: Right  hand dominant;Generalized weakness RUE Deficits / Details: difficult to fully assess due to cognition; generalized weakness, decreased shoulder AROM/AAROM; decreased proprioception noted during funcitonal tasks; decreased fine and gross motor coordinaiton noted during functional tasks RUE Sensation: decreased proprioception RUE Coordination: decreased gross motor;decreased fine motor LUE Deficits / Details: difficult to fully assess due to cognition; generalized weakness, decreased shoulder AROM/AAROM; decreased proprioception noted during funcitonal tasks; decreased fine and gross motor coordinaiton noted during functional tasks LUE Sensation: decreased proprioception LUE Coordination: decreased fine motor;decreased gross motor   Lower Extremity Assessment Lower Extremity Assessment: Defer to PT evaluation        Vision       Perception     Praxis      Cognition Arousal: Lethargic, Alert Behavior During Therapy: Flat affect Overall Cognitive Status: History of cognitive impairments - at baseline                                 General Comments: Pt sleeping upon arrival and lethargic when awoke. Pt with increased alertness with lights turned on and bed moved into chair position but intermittent moments of lethargy requiring tactile cues to bring pt back to full alertness. Pt pleasant throughout session.         Exercises      Shoulder Instructions       General Comments      Pertinent Vitals/ Pain       Pain Assessment Pain Assessment: Faces Faces Pain Scale: No hurt  Home Living                                          Prior Functioning/Environment              Frequency  Min 1X/week        Progress Toward Goals  OT Goals(current goals can now be found in the care plan section)  Progress towards OT goals: Not progressing toward goals - comment (Pt with limited to no progress at this time secondary to increased lethergy  and impaired cognition)  Acute Rehab OT Goals Patient Stated Goal: pt unable to state this session  Plan      Co-evaluation                 AM-PAC OT 6 Clicks Daily Activity     Outcome Measure   Help from another person eating meals?: A Little Help from another person taking care of personal grooming?: A Little Help from another person toileting, which includes using toliet, bedpan, or urinal?: Total Help from another person bathing (including washing, rinsing, drying)?: A Lot Help from another person to put on and taking off regular upper body clothing?: A Lot Help from another person to put on and taking off regular lower body clothing?: A Lot 6 Click Score: 13    End of Session    OT Visit Diagnosis: Muscle weakness (generalized) (M62.81);Ataxia, unspecified (R27.0);Other symptoms and signs involving cognitive function;Other (comment) (decreased activity  tolerance)   Activity Tolerance Patient limited by fatigue;Patient limited by lethargy   Patient Left in bed;with call bell/phone within reach;with bed alarm set;with restraints reapplied   Nurse Communication Mobility status;Other (comment) (Pt ate 6 bites of dinner and drank about half a cup of juice through straw with cues for initiaiton and continued participation in/attention to task and continued alertness.)        Time: 8343-8282 OT Time Calculation (min): 21 min  Charges: OT General Charges $OT Visit: 1 Visit OT Treatments $Self Care/Home Management : 8-22 mins  Margarie Rockey HERO., OTR/L, MA Acute Rehab [phone]   Margarie FORBES Horns 05/30/2023, 5:52 PM

## 2023-05-30 NOTE — Progress Notes (Signed)
 [facility]  Date of Admission:  05/25/2023     Total days of antibiotics 6         ASSESSMENT:  Alexander [name] blood culture from 05/27/23 remain without growth to date in the setting of Enterococcus faecalis/Enterococcus avium bacteremia with suspected source of urinary catheter s/p replacement. Awaiting TTE. Discussed plan of care with family to continue current dose of ampicillin  pending TTE and blood culture outcome with hope to be able to transition to oral Amoxicillin . Continue with ampicillin . Monitor blood cultures for continued clearance of bacteremia and await TTE results. Remaining medical and supportive care per Internal Medicine.   PLAN:  Continue current dose of ampicillin . Monitor blood cultures for clearance of bacteremia.  Await TEE results.  Remaining medical and supportive care per Internal Medicine.   Principal Problem:   Complicated UTI (urinary tract infection) Active Problems:   Bacteremia due to Enterococcus   Normocytic anemia   Moderate dementia with behavioral disturbance   Sepsis (HCC)   Complication of Foley catheter (HCC)   Hyperkalemia    Chlorhexidine  Gluconate Cloth  6 each Topical Daily   enoxaparin  (LOVENOX ) injection  40 mg Subcutaneous Daily   escitalopram   20 mg Oral QHS   feeding supplement  237 mL Oral BID BM   risperiDONE   1 mg Oral QHS   sodium chloride  flush  3 mL Intravenous Q12H   traZODone   100 mg Oral QHS    SUBJECTIVE:  Afebrile overnight with no acute events. More alert today. Family at beside. Feeling good today.   No Known Allergies   Review of Systems: Review of Systems  Unable to perform ROS: Dementia      OBJECTIVE: Vitals:   05/29/23 1546 05/29/23 1956 05/30/23 0500 05/30/23 0844  BP: (!) 151/70 (!) 119/56 120/61 (!) 122/55  Pulse: (!) 51 (!) 58 (!) 55 (!) 49  Resp: 18 18 18 18   Temp: 98.3 F (36.8 C) 98.2 F (36.8 C) 98.3 F (36.8 C) 98.4 F (36.9 C)  TempSrc:       SpO2: 100%  100% 100%  Weight:      Height:       Body mass index is 21.82 kg/m.  Physical Exam Constitutional:      General: He is not in acute distress.    Appearance: He is well-developed.     Comments: Lying in the bed with head of bed elevated  Cardiovascular:     Rate and Rhythm: Normal rate and regular rhythm.     Heart sounds: Normal heart sounds.  Pulmonary:     Effort: Pulmonary effort is normal.     Breath sounds: Normal breath sounds.  Skin:    General: Skin is warm and dry.  Neurological:     Mental Status: He is alert. He is disoriented.     Comments: Oriented to person.      Lab Results Lab Results  Component Value Date   WBC 10.2 05/27/2023   HGB 8.8 (L) 05/30/2023   HCT 27.9 (L) 05/30/2023   MCV 97.5 05/27/2023   PLT 189 05/27/2023    Lab Results  Component Value Date   CREATININE 0.71 05/30/2023   BUN 14 05/30/2023   NA 141 05/30/2023   K 4.0 05/30/2023   CL 101 05/30/2023   CO2 33 (H) 05/30/2023    Lab Results  Component Value Date   ALT 14 05/26/2023   AST 34 05/26/2023   ALKPHOS 48 05/26/2023  BILITOT 1.0 05/26/2023     Microbiology: Recent Results (from the past 240 hours)  Culture, blood (single)     Status: Abnormal   Collection Time: 05/26/23  4:50 AM   Specimen: BLOOD  Result Value Ref Range Status   Specimen Description BLOOD SITE NOT SPECIFIED  Final   Special Requests   Final    BOTTLES DRAWN AEROBIC AND ANAEROBIC Blood Culture results may not be optimal due to an inadequate volume of blood received in culture bottles   Culture  Setup Time   Final    GRAM POSITIVE COCCI IN BOTH AEROBIC AND ANAEROBIC BOTTLES CRITICAL RESULT CALLED TO, READ BACK BY AND VERIFIED WITH: [facility]  05/27/23 MK Performed at [facility], [address] [place], [place] [address]    Culture ENTEROCOCCUS AVIUM ENTEROCOCCUS FAECALIS  (A)  Final   Report Status 05/29/2023 FINAL  Final   Organism ID, Bacteria ENTEROCOCCUS  AVIUM  Final   Organism ID, Bacteria ENTEROCOCCUS FAECALIS  Final      Susceptibility   Enterococcus avium - MIC*    AMPICILLIN  <=2 SENSITIVE Sensitive     VANCOMYCIN  <=0.5 SENSITIVE Sensitive     GENTAMICIN SYNERGY SENSITIVE Sensitive     * ENTEROCOCCUS AVIUM   Enterococcus faecalis - MIC*    AMPICILLIN  <=2 SENSITIVE Sensitive     VANCOMYCIN  1 SENSITIVE Sensitive     GENTAMICIN SYNERGY SENSITIVE Sensitive     * ENTEROCOCCUS FAECALIS  Blood Culture ID Panel (Reflexed)     Status: Abnormal   Collection Time: 05/26/23  4:50 AM  Result Value Ref Range Status   Enterococcus faecalis DETECTED (A) NOT DETECTED Final    Comment: CRITICAL RESULT CALLED TO, READ BACK BY AND VERIFIED WITH: J WYLAND,PHARMD@0200  05/27/23 MK    Enterococcus Faecium NOT DETECTED NOT DETECTED Final   Listeria monocytogenes NOT DETECTED NOT DETECTED Final   Staphylococcus species NOT DETECTED NOT DETECTED Final   Staphylococcus aureus (BCID) NOT DETECTED NOT DETECTED Final   Staphylococcus epidermidis NOT DETECTED NOT DETECTED Final   Staphylococcus lugdunensis NOT DETECTED NOT DETECTED Final   Streptococcus species NOT DETECTED NOT DETECTED Final   Streptococcus agalactiae NOT DETECTED NOT DETECTED Final   Streptococcus pneumoniae NOT DETECTED NOT DETECTED Final   Streptococcus pyogenes NOT DETECTED NOT DETECTED Final   A.calcoaceticus-baumannii NOT DETECTED NOT DETECTED Final   Bacteroides fragilis NOT DETECTED NOT DETECTED Final   Enterobacterales NOT DETECTED NOT DETECTED Final   Enterobacter cloacae complex NOT DETECTED NOT DETECTED Final   Escherichia coli NOT DETECTED NOT DETECTED Final   Klebsiella aerogenes NOT DETECTED NOT DETECTED Final   Klebsiella oxytoca NOT DETECTED NOT DETECTED Final   Klebsiella pneumoniae NOT DETECTED NOT DETECTED Final   Proteus species NOT DETECTED NOT DETECTED Final   Salmonella species NOT DETECTED NOT DETECTED Final   Serratia marcescens NOT DETECTED NOT DETECTED Final    Haemophilus influenzae NOT DETECTED NOT DETECTED Final   Neisseria meningitidis NOT DETECTED NOT DETECTED Final   Pseudomonas aeruginosa NOT DETECTED NOT DETECTED Final   Stenotrophomonas maltophilia NOT DETECTED NOT DETECTED Final   Candida albicans NOT DETECTED NOT DETECTED Final   Candida auris NOT DETECTED NOT DETECTED Final   Candida glabrata NOT DETECTED NOT DETECTED Final   Candida krusei NOT DETECTED NOT DETECTED Final   Candida parapsilosis NOT DETECTED NOT DETECTED Final   Candida tropicalis NOT DETECTED NOT DETECTED Final   Cryptococcus neoformans/gattii NOT DETECTED NOT DETECTED Final   Vancomycin  resistance NOT DETECTED  NOT DETECTED Final    Comment: Performed at [facility], [address], [place], [place] [address]  Urine Culture     Status: Abnormal   Collection Time: 05/26/23  4:59 AM   Specimen: Urine, Catheterized  Result Value Ref Range Status   Specimen Description URINE, CATHETERIZED  Final   Special Requests   Final    NONE Reflexed from [id number] Performed at [facility], [address] [place], [place] [address]    Culture (A)  Final    >=100,000 COLONIES/mL ENTEROCOCCUS AVIUM >=100,000 COLONIES/mL PSEUDOMONAS AERUGINOSA    Report Status 05/29/2023 FINAL  Final   Organism ID, Bacteria ENTEROCOCCUS AVIUM (A)  Final   Organism ID, Bacteria PSEUDOMONAS AERUGINOSA (A)  Final      Susceptibility   Enterococcus avium - MIC*    AMPICILLIN  <=2 SENSITIVE Sensitive     NITROFURANTOIN 64 INTERMEDIATE Intermediate     VANCOMYCIN  <=0.5 SENSITIVE Sensitive     * >=100,000 COLONIES/mL ENTEROCOCCUS AVIUM   Pseudomonas aeruginosa - MIC*    CEFTAZIDIME 8 SENSITIVE Sensitive     CIPROFLOXACIN <=0.25 SENSITIVE Sensitive     GENTAMICIN 2 SENSITIVE Sensitive     IMIPENEM 2 SENSITIVE Sensitive     * >=100,000 COLONIES/mL PSEUDOMONAS AERUGINOSA  Resp panel by RT-PCR (RSV, Flu A&B, Covid) Anterior Nasal Swab     Status: None   Collection Time: 05/26/23  5:53  AM   Specimen: Anterior Nasal Swab  Result Value Ref Range Status   SARS Coronavirus 2 by RT PCR NEGATIVE NEGATIVE Final   Influenza A by PCR NEGATIVE NEGATIVE Final   Influenza B by PCR NEGATIVE NEGATIVE Final    Comment: (NOTE) The Xpert Xpress SARS-CoV-2/FLU/RSV plus assay is intended as an aid in the diagnosis of influenza from Nasopharyngeal swab specimens and should not be used as a sole basis for treatment. Nasal washings and aspirates are unacceptable for Xpert Xpress SARS-CoV-2/FLU/RSV testing.  Fact Sheet for Patients: bloggercourse.com  Fact Sheet for Healthcare Providers: seriousbroker.it  This test is not yet approved or cleared by the [place]  FDA and has been authorized for detection and/or diagnosis of SARS-CoV-2 by FDA under an Emergency Use Authorization (EUA). This EUA will remain in effect (meaning this test can be used) for the duration of the COVID-19 declaration under Section 564(b)(1) of the Act, 21 U.S.C. section 360bbb-3(b)(1), unless the authorization is terminated or revoked.     Resp Syncytial Virus by PCR NEGATIVE NEGATIVE Final    Comment: (NOTE) Fact Sheet for Patients: bloggercourse.com  Fact Sheet for Healthcare Providers: seriousbroker.it  This test is not yet approved or cleared by the [place]  FDA and has been authorized for detection and/or diagnosis of SARS-CoV-2 by FDA under an Emergency Use Authorization (EUA). This EUA will remain in effect (meaning this test can be used) for the duration of the COVID-19 declaration under Section 564(b)(1) of the Act, 21 U.S.C. section 360bbb-3(b)(1), unless the authorization is terminated or revoked.  Performed at [facility], [address] [place], [place] [address]   Culture, blood (Routine X 2) w Reflex to ID Panel     Status: None (Preliminary result)   Collection Time:  05/27/23  9:41 AM   Specimen: BLOOD RIGHT ARM  Result Value Ref Range Status   Specimen Description BLOOD RIGHT ARM  Final   Special Requests   Final    BOTTLES DRAWN AEROBIC AND ANAEROBIC Blood Culture adequate volume   Culture  Final    NO GROWTH 3 DAYS Performed at [facility], [address], [place], [place] [address]    Report Status PENDING  Incomplete  Culture, blood (Routine X 2) w Reflex to ID Panel     Status: None (Preliminary result)   Collection Time: 05/27/23  9:42 AM   Specimen: BLOOD RIGHT HAND  Result Value Ref Range Status   Specimen Description BLOOD RIGHT HAND  Final   Special Requests   Final    BOTTLES DRAWN AEROBIC AND ANAEROBIC Blood Culture adequate volume   Culture   Final    NO GROWTH 3 DAYS Performed at [facility], [address] [place], [place] [address]    Report Status PENDING  Incomplete    I have personally spent 26 minutes involved in face-to-face and non-face-to-face activities for this patient on the day of the visit. Professional time spent includes the following activities: Preparing to see the patient (review of tests), Obtaining and/or reviewing separately obtained history (admission/discharge record), Performing a medically appropriate examination and/or evaluation , Ordering medications/tests/procedures, referring and communicating with other health care professionals, Documenting clinical information in the EMR, Independently interpreting results (not separately reported), Communicating results to the patient/family/caregiver, Counseling and educating the patient/family/caregiver and Care coordination (not separately reported).    Greg Leeza Heiner, NP [facility]  05/30/2023  11:21 AM

## 2023-05-30 NOTE — Plan of Care (Signed)
  Problem: Education: Goal: Knowledge of General Education information will improve Description: Including pain rating scale, medication(s)/side effects and non-pharmacologic comfort measures Outcome: Progressing   Problem: Health Behavior/Discharge Planning: Goal: Ability to manage health-related needs will improve Outcome: Progressing   Problem: Clinical Measurements: Goal: Ability to maintain clinical measurements within normal limits will improve Outcome: Progressing Goal: Will remain free from infection Outcome: Progressing Goal: Diagnostic test results will improve Outcome: Progressing Goal: Respiratory complications will improve Outcome: Progressing Goal: Cardiovascular complication will be avoided Outcome: Progressing   Problem: Nutrition: Goal: Adequate nutrition will be maintained Outcome: Progressing   Problem: Coping: Goal: Level of anxiety will decrease Outcome: Progressing   Problem: Elimination: Goal: Will not experience complications related to urinary retention Outcome: Progressing   Problem: Pain Managment: Goal: General experience of comfort will improve and/or be controlled Outcome: Progressing   Problem: Safety: Goal: Ability to remain free from injury will improve Outcome: Progressing   Problem: Skin Integrity: Goal: Risk for impaired skin integrity will decrease Outcome: Progressing   Problem: Activity: Goal: Risk for activity intolerance will decrease Outcome: Not Progressing - too weak to get out of bed, OT did work with him; more alert today   Problem: Elimination: Goal: Will not experience complications related to bowel motility Outcome: Not Progressing - miralax  given for consipation

## 2023-05-30 NOTE — Progress Notes (Signed)
 [facility]                                                                               Alexander [name], is a 89 y.o. male, DOB - 1932-10-06, FMW:[id number] Admit date - 05/25/2023    Outpatient Primary MD for the patient is [name], MD  LOS - 4  days    Brief summary    89 yrs. old male with medical history significant of hypertension, hyperlipidemia, nonobstructive CAD, COPD, BPH with chronic indwelling Foley, depression, and severe dementia currently on hospice who presented to the ED due to his Foley catheter not draining urine properly. He is accompanied by his daughter and son-in-law who provided his history.  Patient has enlarged prostate, and has been experiencing UTI almost every month.  Daughter reports his urine appeared concerning for infection, he has been having shaking and hallucinations which is not usual for him.  He is found to have fever of 103 in the ED.  Last UTI was in August with Klebsiella pneumonia identified in culture.  UA significant for many bacteria, leukocytes WBCs.  Blood and urine cultures were obtained.  Urology was consulted for difficult Foley insertion.  Patient was empirically started on antibiotics for suspected sepsis secondary to complicated UTI.    Assessment & Plan    Assessment and Plan:   Sepsis secondary to complicated UTI: Enterococcus Bacteremia : Patient has chronic indwelling Foley catheter in place.   He was noted to be febrile in the ED with 103F, tachycardia meeting SIRS criteria. UA after replacement of Foley catheter noted large hemoglobin,  leukocytes,  many bacteria and WBC. Lactic acid was reassuring at 1.5.  Urine and blood culture was obtained.   Prior cultures from 11/2022 grew out Klebsiella pneumonia that appears sensitive to Rocephin .   Patient initiated on empiric antibiotics  (metronidazole , vancomycin , and cefepime ). De-escalate antibiotics as cultures comes back. RVP panel negative.  Blood  cultures grew Enterococcus faecalis. Urine culture grew enterococci and Pseudomonas.  Antibiotics changed to zosyn . Repeat blood cultures are ordered, ID consulted. Antibiotics changed to ampicillin , zosyn  discontinued. TTE with no obvious vegetations or endocarditis.  No need for TEE.  Repeat blood cultures negative.  Plan to transition to oral amoxicillin  to complete 2 week course.    UTI due to clogged Foley catheter: Hematuria > Resolved. Patient presented due to hematuria and no urine output. Urology had been formally consulted and replaced a Foley catheter while in the emergency department. Follow-up with urology for possible suprapubic catheter outpatient. Hematuria resolved.  Urine has been clear , draining well. Appreciate urology consultative services.   Normocytic anemia: Anemia of chronic disease.  Hemoglobin around 8 and stable.    Hyperkalemia: Resolved.    Dementia with behavioral disturbance: Altered mental status probably from UTI.  -Delirium precautions -Continue Risperdal  and Ativan  as needed. - pt is more alert and responding to questions.    Goals of care: Patient is in hospice at home presented with UTI.   Family wanted full code. Palliative care consulted to clarify goals of care.  full scope of treatment .         RN Pressure Injury  Documentation: Pressure Injury 02/11/23 Sacrum Mid Stage 1 -  Intact skin with non-blanchable redness of a localized area usually over a bony prominence. (Active)  02/11/23   Location: Sacrum  Location Orientation: Mid  Staging: Stage 1 -  Intact skin with non-blanchable redness of a localized area usually over a bony prominence.  Wound Description (Comments):   Present on Admission: No     Estimated body mass index is 21.82 kg/m as calculated from the following:   Height as of this encounter: 5' 8 (1.727 m).   Weight as of this encounter: 65.1 kg.  Code Status: full code DVT Prophylaxis:  enoxaparin   (LOVENOX ) injection 40 mg Start: 05/26/23 1000   Level of Care: Level of care: Med-Surg Family Communication: family at bedside.   Disposition Plan:     Remains inpatient appropriate:  possible d.c in am.   Procedures:  TTE  Consultants:   ID  Antimicrobials:   Anti-infectives (From admission, onward)    Start     Dose/Rate Route Frequency Ordered Stop   05/29/23 1400  ampicillin  (OMNIPEN) 2 g in sodium chloride  0.9 % 100 mL IVPB        2 g 300 mL/hr over 20 Minutes Intravenous Every 4 hours 05/29/23 0940     05/28/23 1100  piperacillin -tazobactam (ZOSYN ) IVPB 3.375 g  Status:  Discontinued        3.375 g 12.5 mL/hr over 240 Minutes Intravenous Every 8 hours 05/28/23 1001 05/29/23 0940   05/27/23 0315  ampicillin  (OMNIPEN) 2 g in sodium chloride  0.9 % 100 mL IVPB  Status:  Discontinued        2 g 300 mL/hr over 20 Minutes Intravenous Every 4 hours 05/27/23 0220 05/28/23 1001   05/26/23 1200  cefTRIAXone  (ROCEPHIN ) 2 g in sodium chloride  0.9 % 100 mL IVPB  Status:  Discontinued        2 g 200 mL/hr over 30 Minutes Intravenous Every 24 hours 05/26/23 0749 05/27/23 0220   05/26/23 0500  ceFEPIme  (MAXIPIME ) 2 g in sodium chloride  0.9 % 100 mL IVPB        2 g 200 mL/hr over 30 Minutes Intravenous  Once 05/26/23 0458 05/26/23 0601   05/26/23 0500  metroNIDAZOLE  (FLAGYL ) IVPB 500 mg        500 mg 100 mL/hr over 60 Minutes Intravenous  Once 05/26/23 0458 05/26/23 0559   05/26/23 0500  vancomycin  (VANCOCIN ) IVPB 1000 mg/200 mL premix        1,000 mg 200 mL/hr over 60 Minutes Intravenous  Once 05/26/23 0458 05/26/23 0651        Medications  Scheduled Meds:  Chlorhexidine  Gluconate Cloth  6 each Topical Daily   enoxaparin  (LOVENOX ) injection  40 mg Subcutaneous Daily   escitalopram   20 mg Oral QHS   feeding supplement  237 mL Oral BID BM   risperiDONE   1 mg Oral QHS   sodium chloride  flush  3 mL Intravenous Q12H   traZODone   100 mg Oral QHS   Continuous Infusions:   ampicillin  (OMNIPEN) IV 2 g (05/30/23 1032)   PRN Meds:.acetaminophen  **OR** acetaminophen , albuterol , LORazepam , ondansetron  **OR** ondansetron  (ZOFRAN ) IV, mouth rinse, polyethylene glycol    Subjective:   Alexander [name] was seen and examined today.  Ht is more alert and responding to questions.   Objective:   Vitals:   05/29/23 1546 05/29/23 1956 05/30/23 0500 05/30/23 0844  BP: (!) 151/70 (!) 119/56 120/61 (!) 122/55  Pulse: (!) 51 (!) 58 ROLLEN)  55 (!) 49  Resp: 18 18 18 18   Temp: 98.3 F (36.8 C) 98.2 F (36.8 C) 98.3 F (36.8 C) 98.4 F (36.9 C)  TempSrc:      SpO2: 100%  100% 100%  Weight:      Height:        Intake/Output Summary (Last 24 hours) at 05/30/2023 1106 Last data filed at 05/30/2023 1032 Gross per 24 hour  Intake 960 ml  Output 850 ml  Net 110 ml   Filed Weights   05/25/23 2155  Weight: 65.1 kg     Exam General exam:frail elderly man, not in distress.  Respiratory system: Clear to auscultation. Respiratory effort normal. Cardiovascular system: S1 & S2 heard, RRR.  Gastrointestinal system: Abdomen is nondistended, soft and nontender.  Central nervous system: Alert and oriented to person only.  Extremities: no peda edema.  Skin: No rashes,  Psychiatry:unable to assess.     Data Reviewed:  I have personally reviewed following labs and imaging studies   CBC Lab Results  Component Value Date   WBC 10.2 05/27/2023   RBC 2.78 (L) 05/27/2023   HGB 8.8 (L) 05/30/2023   HCT 27.9 (L) 05/30/2023   MCV 97.5 05/27/2023   MCH 31.3 05/27/2023   PLT 189 05/27/2023   MCHC 32.1 05/27/2023   RDW 13.7 05/27/2023   LYMPHSABS 1.0 03/19/2023   MONOABS 1.6 (H) 03/19/2023   EOSABS 0.0 03/19/2023   BASOSABS 0.1 03/19/2023     Last metabolic panel Lab Results  Component Value Date   NA 141 05/30/2023   K 4.0 05/30/2023   CL 101 05/30/2023   CO2 33 (H) 05/30/2023   BUN 14 05/30/2023   CREATININE 0.71 05/30/2023   GLUCOSE 87 05/30/2023   GFRNONAA  >60 05/30/2023   GFRAA >60 07/09/2019   CALCIUM  8.4 (L) 05/30/2023   PHOS 3.1 03/20/2023   PROT 5.2 (L) 05/26/2023   ALBUMIN 3.1 (L) 05/26/2023   BILITOT 1.0 05/26/2023   ALKPHOS 48 05/26/2023   AST 34 05/26/2023   ALT 14 05/26/2023   ANIONGAP 7 05/30/2023    CBG (last 3)  Recent Labs    05/30/23 0253  GLUCAP 83      Coagulation Profile: Recent Labs  Lab 05/26/23 0510  INR 1.2     Radiology Studies: No results found.     Elgie Butter M.D. [facility] 05/30/2023, 11:06 AM  Available via Epic secure chat 7am-7pm After 7 pm, please refer to night coverage provider listed on amion.

## 2023-05-30 NOTE — Progress Notes (Signed)
 [facility] [phone] [facility] Liaison Note?     [name] is a current [facility] hospice patient with a terminal diagnosis of abnormal weight loss. He experienced urine not draining properly along with pain and hematuria. EMS was called to assist and took him to the ED. He was admitted on 2.1.25 with concerns for possible UTI and blocked catheter. Per Dr. Norleen [name] with [facility] this is a related hospital admission.    Visited patient with daughter at bedside. Patient is having cardiac ultrasound at time of my visit. Discussed care with daughter who anticipates possible discharge soon depending on outcome of echo results and ID input.     Patient is inpatient appropriate due to need for IV antibiotics and close monitoring for effectiveness of interventions for suspected sepsis secondary to complicated UTI.    Vital Signs: 98.4/49/18    122/55    O2 100% on 2LPM    I/O:  540/850   Abnormal labs: Ca 8.4, HGB 8.8   Diagnostics: none new   IV/PRN Meds: Ampicillin  2G IV every 4 hours      Problem list per MD note Alexander Bogus, MD 2.4.25 (updated progress note not available at time of documentation) Assessment & Plan:   Principal Problem:   Complicated UTI (urinary tract infection) Active Problems:   Sepsis (HCC)   Complication of Foley catheter (HCC)   Normocytic anemia   Hyperkalemia   Moderate dementia with behavioral disturbance   Bacteremia due to Enterococcus   Sepsis secondary to complicated UTI: Enterococcus Bacteremia : Patient has chronic indwelling Foley catheter in place.   He was noted to be febrile in the ED with 103F, tachycardia meeting SIRS criteria. UA after replacement of Foley catheter noted large hemoglobin,  leukocytes,  many bacteria and WBC. Lactic acid was reassuring at 1.5.  Urine and blood culture was obtained.   Prior cultures from 11/2022 grew out Klebsiella pneumonia that appears sensitive to Rocephin .   Patient initiated  on empiric antibiotics  (metronidazole , vancomycin , and cefepime ). De-escalate antibiotics as cultures comes back. RVP panel negative.  Blood cultures grew Enterococcus faecalis. Urine culture grew enterococci and Pseudomonas.  Antibiotics changed to zosyn . Repeat blood cultures are ordered, ID consulted. Antibiotics changed to ampicillin , zosyn  discontinued. Obtain TTE, follow up repeat blood cultures.   UTI due to clogged Foley catheter: Hematuria > Resolved. Patient presented due to hematuria and no urine output. Urology had been formally consulted and replaced a Foley catheter while in the emergency department. Follow-up with urology for possible suprapubic catheter outpatient. Hematuria resolved.  Urine has been clear , draining well. Appreciate urology consultative services.   Normocytic anemia: Chronic.  Baseline hemoglobin remains around 11.0. Hemoglobin is dropped to 8.7, urine is clear. There is no other visible signs of any active bleeding. Monitor H&H daily   Hyperkalemia: Potassium normalized.   Dementia with behavioral disturbance: Daughter noted that patient had been having hallucinations and been more altered than normal.   Suspect urinary tract infection possibly cause for symptoms. -Delirium precautions -Continue Risperdal  and Ativan  as needed.   Discharge Planning:? Discharge back home once medically stable   Family contact: Spoke with daughter at bedside   IDT:? Updated?     Goals of Care: Full Code   If patient requires EMS transport at discharge, please us  [facility] as that is who [facility] is contracted with for transport.   Please call with any hospice related questions or concerns.   Alexander [name], BSN, RN, [facility]  liaison [phone]

## 2023-05-30 NOTE — Progress Notes (Signed)
 Echocardiogram 2D Echocardiogram has been performed.  Alexander [name] 05/30/2023, 11:37 AM

## 2023-05-31 DIAGNOSIS — D649 Anemia, unspecified: Secondary | ICD-10-CM | POA: Diagnosis not present

## 2023-05-31 DIAGNOSIS — F03B18 Unspecified dementia, moderate, with other behavioral disturbance: Secondary | ICD-10-CM | POA: Diagnosis not present

## 2023-05-31 DIAGNOSIS — E875 Hyperkalemia: Secondary | ICD-10-CM | POA: Diagnosis not present

## 2023-05-31 DIAGNOSIS — N390 Urinary tract infection, site not specified: Secondary | ICD-10-CM | POA: Diagnosis not present

## 2023-05-31 NOTE — TOC Progression Note (Signed)
 Transition of Care [facility]) - Progression Note    Patient Details  Name: Alexander [name] MRN: [id number] Date of Birth: [birth year]  Transition of Care [facility]) CM/SW Contact  Gwenn Frieze [place], [place] Phone Number: 05/31/2023, 3:13 PM  Clinical Narrative: Discussed SNF with [name]. Dtr is requesting SNF placement for rehab. Reviewed need to revoke hospice and dtr is agreeable to revocation provided [facility] approves SNF. Voicemail left for [employer] requesting SNF auth.   Also discussed Medicaid for LTC. Dtr reports she completed the Medicaid application but has not had to the opportunity to follow through with the process. Will provide updates as available.     Frieze Gwenn, MSW, LCSW [phone] (coverage)           Expected Discharge Plan and Services                                               Social Determinants of Health (SDOH) Interventions SDOH Screenings   Food Insecurity: No Food Insecurity (05/26/2023)  Housing: Low Risk  (05/26/2023)  Transportation Needs: No Transportation Needs (05/26/2023)  Utilities: Not At Risk (05/26/2023)  Depression (PHQ2-9): Low Risk  (12/16/2020)  Financial Resource Strain: Low Risk  (09/07/2017)  Physical Activity: Sufficiently Active (09/07/2017)  Social Connections: Moderately Isolated (05/26/2023)  Stress: No Stress Concern Present (09/07/2017)  Tobacco Use: Medium Risk (05/25/2023)    Readmission Risk Interventions    05/29/2023    3:53 PM 03/20/2023    2:33 PM 11/30/2022    9:58 AM  Readmission Risk Prevention Plan  Transportation Screening Complete Complete Complete  PCP or Specialist Appt within 5-7 Days   Complete  Home Care Screening   Complete  Medication Review (RN CM)   Complete  Medication Review (RN Care Manager) Referral to Pharmacy Referral to Pharmacy   PCP or Specialist appointment within 3-5 days of discharge Complete Complete   HRI or Home Care Consult Complete Complete   SW Recovery  Care/Counseling Consult Complete Complete   Palliative Care Screening Not Applicable Not Applicable   Skilled Nursing Facility Not Applicable Not Applicable

## 2023-05-31 NOTE — Progress Notes (Signed)
 Physical Therapy Treatment Patient Details Name: Alexander [name] MRN: [id number] DOB: June 27, 1932 Today's Date: 05/31/2023   History of Present Illness The pt is an 89 yo male presenting to [facility] 1/31 with AMS and blood in urine after attempted indwelling catheter change. Admitted with sepsis and possible UTI. PMH includes: BPH, CAD, HTN, CHF, COPD, and Alzheimer's dementia on hospice care at home.    PT Comments  Pt seen for PT tx with pt received asleep in bed with BUE mittens on (PT doffing them) requiring extra time & multimodal cuing to increase alertness. Pt requires total assist to come to sitting EOB, max assist for STS attempts from elevated EOB. Pt unable to come to upright standing & continues to demonstrate posterior lean. Pt assisted bed>recliner with HHA max assist +1. Pt left in recliner in care of NT to assist with breakfast. Will continue to follow pt acutely to address strengthening, balance, activity tolerance, to increase independence with bed mobility & transfers as able.   If plan is discharge home, recommend the following: Two people to help with walking and/or transfers;Two people to help with bathing/dressing/bathroom   Can travel by Doctor, Hospital (measurements PT);Wheelchair cushion (measurements PT);Hospital bed;Hoyer lift    Recommendations for Other Services       Precautions / Restrictions Precautions Precautions: Fall Precaution Comments: dementia Restrictions Weight Bearing Restrictions Per Provider Order: No     Mobility  Bed Mobility   Bed Mobility: Supine to Sit     Supine to sit: Total assist, HOB elevated, Used rails     General bed mobility comments: exit R side of bed    Transfers Overall transfer level: Needs assistance Equipment used: Rolling walker (2 wheels), 1 person hand held assist Transfers: Sit to/from Stand Sit to Stand: Max assist (STS with RW) Stand pivot transfers: Max  assist, From elevated surface         General transfer comment: STS from EOB x 2 with RW with PT providing assistance for foot placement, hand placement on RW, assistance to power up to standing. Pt is able to clear buttocks but does not attempt to shift pelvis anteriorly to upright trunk & keeps BLE knee extended. PT provided max assist +1 HHA for stand pivot bed>recliner on R with manual facilitation & multimodal cuing for sequencing, technique.    Ambulation/Gait                   Stairs             Wheelchair Mobility     Tilt Bed    Modified Rankin (Stroke Patients Only)       Balance Overall balance assessment: Needs assistance Sitting-balance support: No upper extremity supported, Feet supported Sitting balance-Leahy Scale: Poor Sitting balance - Comments: close supervision<>CGA static sitting   Standing balance support: Bilateral upper extremity supported, During functional activity, Reliant on assistive device for balance Standing balance-Leahy Scale: Zero                              Cognition Arousal: Lethargic Behavior During Therapy: Flat affect Overall Cognitive Status: History of cognitive impairments - at baseline                                 General Comments: Pt lethargic, requiring max encouragement to  increase alertness, finally opens eyes once sitting EOB but demonstrates poor ability to follow simple commands even with extra time.        Exercises      General Comments General comments (skin integrity, edema, etc.): Pt on 2L/min via nasal cannula throughout session.      Pertinent Vitals/Pain Pain Assessment Pain Assessment: PAINAD Breathing: normal Negative Vocalization: occasional moan/groan, low speech, negative/disapproving quality Facial Expression: smiling or inexpressive Body Language: relaxed Consolability: no need to console PAINAD Score: 1    Home Living                           Prior Function            PT Goals (current goals can now be found in the care plan section) Acute Rehab PT Goals Patient Stated Goal: none stated, no family present PT Goal Formulation: Patient unable to participate in goal setting Time For Goal Achievement: 06/10/23 Potential to Achieve Goals: Poor Progress towards PT goals: Progressing toward goals    Frequency    Min 1X/week      PT Plan      Co-evaluation              AM-PAC PT 6 Clicks Mobility   Outcome Measure  Help needed turning from your back to your side while in a flat bed without using bedrails?: A Lot Help needed moving from lying on your back to sitting on the side of a flat bed without using bedrails?: Total Help needed moving to and from a bed to a chair (including a wheelchair)?: Total Help needed standing up from a chair using your arms (e.g., wheelchair or bedside chair)?: Total Help needed to walk in hospital room?: Total Help needed climbing 3-5 steps with a railing? : Total 6 Click Score: 7    End of Session Equipment Utilized During Treatment: Oxygen;Gait belt Activity Tolerance: Patient tolerated treatment well Patient left: in chair;with chair alarm set;with nursing/sitter in room   PT Visit Diagnosis: Unsteadiness on feet (R26.81);Muscle weakness (generalized) (M62.81);Other abnormalities of gait and mobility (R26.89);Difficulty in walking, not elsewhere classified (R26.2)     Time: 9046-8995 PT Time Calculation (min) (ACUTE ONLY): 11 min  Charges:    $Therapeutic Activity: 8-22 mins PT General Charges $$ ACUTE PT VISIT: 1 Visit                     Alexander [name], PT, DPT 05/31/23, 10:19 AM   Alexander [name] 05/31/2023, 10:18 AM

## 2023-05-31 NOTE — Progress Notes (Signed)
 [facility]                                                                               Alexander [name], is a 89 y.o. male, DOB - 1933/01/19, FMW:[id number] Admit date - 05/25/2023    Outpatient Primary MD for the patient is [name], MD  LOS - 5  days    Brief summary    89 yrs. old male with medical history significant of hypertension, hyperlipidemia, nonobstructive CAD, COPD, BPH with chronic indwelling Foley, depression, and severe dementia currently on hospice who presented to the ED due to his Foley catheter not draining urine properly. He is accompanied by his daughter and son-in-law who provided his history.  Patient has enlarged prostate, and has been experiencing UTI almost every month.  Daughter reports his urine appeared concerning for infection, he has been having shaking and hallucinations which is not usual for him.  He is found to have fever of 103 in the ED.  Last UTI was in August with Klebsiella pneumonia identified in culture.  UA significant for many bacteria, leukocytes WBCs.  Blood and urine cultures were obtained.  Urology was consulted for difficult Foley insertion.  Patient was empirically started on antibiotics for suspected sepsis secondary to complicated UTI.    Assessment & Plan    Assessment and Plan:   Sepsis secondary to complicated UTI: Enterococcus Bacteremia : Patient has chronic indwelling Foley catheter in place.   He was noted to be febrile in the ED with 103F, tachycardia meeting SIRS criteria. UA after replacement of Foley catheter noted large hemoglobin,  leukocytes,  many bacteria and WBC. Lactic acid was reassuring at 1.5.  Urine and blood culture was obtained.   Prior cultures from 11/2022 grew out Klebsiella pneumonia that appears sensitive to Rocephin .   Patient initiated on empiric antibiotics  (metronidazole , vancomycin , and cefepime ). Transitioned to oral amoxicillin  on discharge.  RVP panel negative.  Blood  cultures grew Enterococcus faecalis. Urine culture grew enterococci and Pseudomonas. Repeat blood cultures are ordered negative so far. , ID consulted. Antibiotics changed to ampicillin , zosyn  discontinued. TTE with no obvious vegetations or endocarditis.  No need for TEE.  Transitioned to oral amoxicillin  to complete 2 week course.    UTI due to clogged Foley catheter: Hematuria > Resolved. Patient presented due to hematuria and no urine output. Urology had been formally consulted and replaced a Foley catheter while in the emergency department. Follow-up with urology for possible suprapubic catheter outpatient. Hematuria resolved.  Urine has been clear , draining well. Urology consult appreciated   Normocytic anemia: Anemia of chronic disease.  Hemoglobin around 8 and stable.    Hyperkalemia: Resolved.   repeat K Is wnl.   Dementia with behavioral disturbance:  Worsening Altered mental status probably from UTI.  -Delirium precautions -Continue Risperdal  and Ativan  as needed. - pt is more alert and responding to questions.    Goals of care: Patient is in hospice at home presented with UTI.  Family wants patient to go to SNF for rehabilitation. Family wanted full code. Palliative care consulted to clarify goals of care.  full scope of treatment .  RN Pressure Injury Documentation: Pressure Injury 02/11/23 Sacrum Mid Stage 1 -  Intact skin with non-blanchable redness of a localized area usually over a bony prominence. (Active)  02/11/23   Location: Sacrum  Location Orientation: Mid  Staging: Stage 1 -  Intact skin with non-blanchable redness of a localized area usually over a bony prominence.  Wound Description (Comments):   Present on Admission: No     Estimated body mass index is 21.82 kg/m as calculated from the following:   Height as of this encounter: 5' 8 (1.727 m).   Weight as of this encounter: 65.1 kg.  Code Status: full code DVT Prophylaxis:   enoxaparin  (LOVENOX ) injection 40 mg Start: 05/26/23 1000   Level of Care: Level of care: Med-Surg Family Communication: family at bedside.   Disposition Plan:     Remains inpatient appropriate:  SNF placement.   Procedures:  TTE  Consultants:   ID  Antimicrobials:   Anti-infectives (From admission, onward)    Start     Dose/Rate Route Frequency Ordered Stop   05/30/23 1800  amoxicillin  (AMOXIL ) capsule 1,000 mg        1,000 mg Oral Every 8 hours 05/30/23 1604 06/10/23 0559   05/29/23 1400  ampicillin  (OMNIPEN) 2 g in sodium chloride  0.9 % 100 mL IVPB  Status:  Discontinued        2 g 300 mL/hr over 20 Minutes Intravenous Every 4 hours 05/29/23 0940 05/30/23 1604   05/28/23 1100  piperacillin -tazobactam (ZOSYN ) IVPB 3.375 g  Status:  Discontinued        3.375 g 12.5 mL/hr over 240 Minutes Intravenous Every 8 hours 05/28/23 1001 05/29/23 0940   05/27/23 0315  ampicillin  (OMNIPEN) 2 g in sodium chloride  0.9 % 100 mL IVPB  Status:  Discontinued        2 g 300 mL/hr over 20 Minutes Intravenous Every 4 hours 05/27/23 0220 05/28/23 1001   05/26/23 1200  cefTRIAXone  (ROCEPHIN ) 2 g in sodium chloride  0.9 % 100 mL IVPB  Status:  Discontinued        2 g 200 mL/hr over 30 Minutes Intravenous Every 24 hours 05/26/23 0749 05/27/23 0220   05/26/23 0500  ceFEPIme  (MAXIPIME ) 2 g in sodium chloride  0.9 % 100 mL IVPB        2 g 200 mL/hr over 30 Minutes Intravenous  Once 05/26/23 0458 05/26/23 0601   05/26/23 0500  metroNIDAZOLE  (FLAGYL ) IVPB 500 mg        500 mg 100 mL/hr over 60 Minutes Intravenous  Once 05/26/23 0458 05/26/23 0559   05/26/23 0500  vancomycin  (VANCOCIN ) IVPB 1000 mg/200 mL premix        1,000 mg 200 mL/hr over 60 Minutes Intravenous  Once 05/26/23 0458 05/26/23 0651        Medications  Scheduled Meds:  amoxicillin   1,000 mg Oral Q8H   Chlorhexidine  Gluconate Cloth  6 each Topical Daily   enoxaparin  (LOVENOX ) injection  40 mg Subcutaneous Daily   escitalopram   20  mg Oral QHS   feeding supplement  237 mL Oral BID BM   risperiDONE   1 mg Oral QHS   sodium chloride  flush  3 mL Intravenous Q12H   traZODone   100 mg Oral QHS   Continuous Infusions:   PRN Meds:.acetaminophen  **OR** acetaminophen , albuterol , LORazepam , ondansetron  **OR** ondansetron  (ZOFRAN ) IV, mouth rinse, polyethylene glycol    Subjective:   Alexander [name] was seen and examined today.  No change from yesterday.   Objective:  Vitals:   05/30/23 1626 05/30/23 2053 05/31/23 0440 05/31/23 0844  BP: 129/65 121/65 129/69 128/69  Pulse: 70 64 (!) 55 (!) 51  Resp: 18 18 18 18   Temp: 98.3 F (36.8 C) 98.9 F (37.2 C) 98.6 F (37 C) 97.6 F (36.4 C)  TempSrc:   Oral Oral  SpO2:  100% 99% 100%  Weight:      Height:        Intake/Output Summary (Last 24 hours) at 05/31/2023 1428 Last data filed at 05/31/2023 1425 Gross per 24 hour  Intake 480 ml  Output 675 ml  Net -195 ml   Filed Weights   05/25/23 2155  Weight: 65.1 kg     Exam General exam: Elderly frail gentleman not in any kind of distress Respiratory system: Clear to auscultation. Respiratory effort normal. Cardiovascular system: S1 & S2 heard, RRR.  Gastrointestinal system: Abdomen is nondistended, soft and nontender.  Central nervous system: Alert and oriented to self only Extremities: No edema Skin: No rashes,     Data Reviewed:  I have personally reviewed following labs and imaging studies   CBC Lab Results  Component Value Date   WBC 10.2 05/27/2023   RBC 2.78 (L) 05/27/2023   HGB 8.8 (L) 05/30/2023   HCT 27.9 (L) 05/30/2023   MCV 97.5 05/27/2023   MCH 31.3 05/27/2023   PLT 189 05/27/2023   MCHC 32.1 05/27/2023   RDW 13.7 05/27/2023   LYMPHSABS 1.0 03/19/2023   MONOABS 1.6 (H) 03/19/2023   EOSABS 0.0 03/19/2023   BASOSABS 0.1 03/19/2023     Last metabolic panel Lab Results  Component Value Date   NA 141 05/30/2023   K 4.0 05/30/2023   CL 101 05/30/2023   CO2 33 (H) 05/30/2023    BUN 14 05/30/2023   CREATININE 0.71 05/30/2023   GLUCOSE 87 05/30/2023   GFRNONAA >60 05/30/2023   GFRAA >60 07/09/2019   CALCIUM  8.4 (L) 05/30/2023   PHOS 3.1 03/20/2023   PROT 5.2 (L) 05/26/2023   ALBUMIN 3.1 (L) 05/26/2023   BILITOT 1.0 05/26/2023   ALKPHOS 48 05/26/2023   AST 34 05/26/2023   ALT 14 05/26/2023   ANIONGAP 7 05/30/2023    CBG (last 3)  Recent Labs    05/30/23 0253  GLUCAP 83      Coagulation Profile: Recent Labs  Lab 05/26/23 0510  INR 1.2     Radiology Studies: ECHOCARDIOGRAM COMPLETE Result Date: 05/30/2023    ECHOCARDIOGRAM REPORT   Patient Name:   CASTIEL LAURICELLA Date of Exam: 05/30/2023 Medical Rec #:  [id number]         Height:       68.0 in Accession #:    [id number]        Weight:       143.5 lb Date of Birth:  March 26, 1933        BSA:          1.775 m Patient Age:    89 years          BP:           122/55 mmHg Patient Gender: M                 HR:           61 bpm. Exam Location:  Inpatient Procedure: 2D Echo, Cardiac Doppler and Color Doppler Indications:    Bacteremia  History:        Patient has prior history of Echocardiogram examinations,  most                 recent 04/12/2020. COPD, Signs/Symptoms:Chest Pain, Dyspnea and                 Syncope; Risk Factors:Hypertension and Dyslipidemia.  Sonographer:    Thea Norlander RCS Referring Phys: [id number] GREGORY D CALONE IMPRESSIONS  1. Left ventricular ejection fraction, by estimation, is 55 to 60%. The left ventricle has normal function. The left ventricle has no regional wall motion abnormalities. Left ventricular diastolic parameters were normal.  2. Right ventricular systolic function is normal. The right ventricular size is mildly enlarged.  3. Right atrial size was mildly dilated.  4. Trivial mitral valve regurgitation.  5. The aortic valve was not well visualized. Aortic valve regurgitation is not visualized. No aortic stenosis is present.  6. The inferior vena cava is dilated in size with >50%  respiratory variability, suggesting right atrial pressure of 8 mmHg. FINDINGS  Left Ventricle: Left ventricular ejection fraction, by estimation, is 55 to 60%. The left ventricle has normal function. The left ventricle has no regional wall motion abnormalities. The left ventricular internal cavity size was normal in size. There is  no left ventricular hypertrophy. Left ventricular diastolic parameters were normal. Right Ventricle: The right ventricular size is mildly enlarged. Right vetricular wall thickness was not assessed. Right ventricular systolic function is normal. Left Atrium: Left atrial size was normal in size. Right Atrium: Right atrial size was mildly dilated. Pericardium: There is no evidence of pericardial effusion. Mitral Valve: There is mild thickening of the mitral valve leaflet(s). Trivial mitral valve regurgitation. Tricuspid Valve: The tricuspid valve is normal in structure. Tricuspid valve regurgitation is mild. Aortic Valve: The aortic valve was not well visualized. Aortic valve regurgitation is not visualized. No aortic stenosis is present. Aortic valve peak gradient measures 8.3 mmHg. Pulmonic Valve: The pulmonic valve was not well visualized. Pulmonic valve regurgitation is not visualized. Aorta: The aortic root and ascending aorta are structurally normal, with no evidence of dilitation. Venous: The inferior vena cava is dilated in size with greater than 50% respiratory variability, suggesting right atrial pressure of 8 mmHg. IAS/Shunts: No atrial level shunt detected by color flow Doppler.  LEFT VENTRICLE PLAX 2D LVIDd:         5.10 cm   Diastology LVIDs:         3.60 cm   LV e' medial:    9.68 cm/s LV PW:         0.90 cm   LV E/e' medial:  9.4 LV IVS:        0.90 cm   LV e' lateral:   9.46 cm/s LVOT diam:     2.20 cm   LV E/e' lateral: 9.6 LV SV:         79 LV SV Index:   44 LVOT Area:     3.80 cm  RIGHT VENTRICLE             IVC RV S prime:     18.30 cm/s  IVC diam: 2.10 cm TAPSE  (M-mode): 2.2 cm LEFT ATRIUM             Index        RIGHT ATRIUM           Index LA diam:        3.30 cm 1.86 cm/m   RA Area:     18.70 cm LA Vol (A2C):   33.9 ml 19.10  ml/m  RA Volume:   51.00 ml  28.73 ml/m LA Vol (A4C):   42.6 ml 24.00 ml/m LA Biplane Vol: 38.2 ml 21.52 ml/m  AORTIC VALVE AV Area (Vmax): 2.75 cm AV Vmax:        144.00 cm/s AV Peak Grad:   8.3 mmHg LVOT Vmax:      104.33 cm/s LVOT Vmean:     65.267 cm/s LVOT VTI:       0.207 m  AORTA Ao Root diam: 3.70 cm Ao Asc diam:  3.70 cm MITRAL VALVE               TRICUSPID VALVE MV Area (PHT): 3.77 cm    TR Peak grad:   50.4 mmHg MV Decel Time: 201 msec    TR Vmax:        355.00 cm/s MV E velocity: 91.20 cm/s                            SHUNTS                            Systemic VTI:  0.21 m                            Systemic Diam: 2.20 cm Vina Gull MD Electronically signed by Vina Gull MD Signature Date/Time: 05/30/2023/2:25:07 PM    Final        Elgie Butter M.D. [facility] 05/31/2023, 2:28 PM  Available via Epic secure chat 7am-7pm After 7 pm, please refer to night coverage provider listed on amion.

## 2023-05-31 NOTE — Progress Notes (Signed)
 Pt had eyes closed and would not follow commands or give verbal response when offering to feed breakfast this morning. When repeatedly asked asked if he was hungry he replied no. Offered Ensure and would not suck the straw. Eyes closed. Will try again in an hour.

## 2023-06-01 DIAGNOSIS — F03B18 Unspecified dementia, moderate, with other behavioral disturbance: Secondary | ICD-10-CM | POA: Diagnosis not present

## 2023-06-01 DIAGNOSIS — D649 Anemia, unspecified: Secondary | ICD-10-CM | POA: Diagnosis not present

## 2023-06-01 DIAGNOSIS — N390 Urinary tract infection, site not specified: Secondary | ICD-10-CM | POA: Diagnosis not present

## 2023-06-01 DIAGNOSIS — A419 Sepsis, unspecified organism: Secondary | ICD-10-CM | POA: Diagnosis not present

## 2023-06-01 LAB — BASIC METABOLIC PANEL
Anion gap: 11 (ref 5–15)
BUN: 14 mg/dL (ref 8–23)
CO2: 29 mmol/L (ref 22–32)
Calcium: 8.9 mg/dL (ref 8.9–10.3)
Chloride: 98 mmol/L (ref 98–111)
Creatinine, Ser: 0.67 mg/dL (ref 0.61–1.24)
GFR, Estimated: >60 mL/min (ref >60)
Glucose, Bld: 101 mg/dL — ABNORMAL HIGH (ref 70–99)
Potassium: 5.2 mmol/L — ABNORMAL HIGH (ref 3.5–5.1)
Sodium: 138 mmol/L (ref 135–145)

## 2023-06-01 LAB — CULTURE, BLOOD (ROUTINE X 2)
Culture: NO GROWTH
Culture: NO GROWTH
Special Requests: ADEQUATE
Special Requests: ADEQUATE

## 2023-06-01 NOTE — Plan of Care (Signed)
  Problem: Education: Goal: Knowledge of General Education information will improve Description: Including pain rating scale, medication(s)/side effects and non-pharmacologic comfort measures 06/01/2023 1735 by Val Violeta FALCON, RN Outcome: Progressing 06/01/2023 1345 by Val Violeta FALCON, RN Outcome: Progressing   Problem: Health Behavior/Discharge Planning: Goal: Ability to manage health-related needs will improve 06/01/2023 1735 by Val Violeta FALCON, RN Outcome: Progressing 06/01/2023 1345 by Val Violeta FALCON, RN Outcome: Progressing   Problem: Clinical Measurements: Goal: Ability to maintain clinical measurements within normal limits will improve 06/01/2023 1735 by Val Violeta FALCON, RN Outcome: Progressing 06/01/2023 1345 by Val Violeta FALCON, RN Outcome: Progressing Goal: Will remain free from infection 06/01/2023 1735 by Val Violeta FALCON, RN Outcome: Progressing 06/01/2023 1345 by Val Violeta FALCON, RN Outcome: Progressing Goal: Diagnostic test results will improve 06/01/2023 1735 by Val Violeta FALCON, RN Outcome: Progressing 06/01/2023 1345 by Val Violeta FALCON, RN Outcome: Progressing Goal: Respiratory complications will improve 06/01/2023 1735 by Val Violeta FALCON, RN Outcome: Progressing 06/01/2023 1345 by Val Violeta FALCON, RN Outcome: Progressing Goal: Cardiovascular complication will be avoided 06/01/2023 1735 by Val Violeta FALCON, RN Outcome: Progressing 06/01/2023 1345 by Val Violeta FALCON, RN Outcome: Progressing   Problem: Activity: Goal: Risk for activity intolerance will decrease 06/01/2023 1735 by Val Violeta FALCON, RN Outcome: Progressing 06/01/2023 1345 by Val Violeta FALCON, RN Outcome: Progressing   Problem: Nutrition: Goal: Adequate nutrition will be maintained 06/01/2023 1735 by Val Violeta FALCON, RN Outcome: Progressing 06/01/2023 1345 by Val Violeta FALCON, RN Outcome: Progressing   Problem: Coping: Goal: Level of anxiety will decrease 06/01/2023  1735 by Val Violeta FALCON, RN Outcome: Progressing 06/01/2023 1345 by Val Violeta FALCON, RN Outcome: Progressing   Problem: Elimination: Goal: Will not experience complications related to bowel motility 06/01/2023 1735 by Val Violeta FALCON, RN Outcome: Progressing 06/01/2023 1345 by Val Violeta FALCON, RN Outcome: Progressing Goal: Will not experience complications related to urinary retention 06/01/2023 1735 by Val Violeta FALCON, RN Outcome: Progressing 06/01/2023 1345 by Val Violeta FALCON, RN Outcome: Progressing   Problem: Pain Managment: Goal: General experience of comfort will improve and/or be controlled 06/01/2023 1735 by Val Violeta FALCON, RN Outcome: Progressing 06/01/2023 1345 by Val Violeta FALCON, RN Outcome: Progressing   Problem: Safety: Goal: Ability to remain free from injury will improve 06/01/2023 1735 by Val Violeta FALCON, RN Outcome: Progressing 06/01/2023 1345 by Val Violeta FALCON, RN Outcome: Progressing   Problem: Skin Integrity: Goal: Risk for impaired skin integrity will decrease 06/01/2023 1735 by Val Violeta FALCON, RN Outcome: Progressing 06/01/2023 1345 by Val Violeta FALCON, RN Outcome: Progressing

## 2023-06-01 NOTE — Progress Notes (Signed)
 [facility] [phone] [facility] COLLECTIVE HOSPITAL LIAISON NOTE   [name] IS A CURRENT [facility] PATIENT WITH A TERMINAL DIAGNOSIS OF ABNORMAL WEIGHT LOSS. HE EXPERIENCED URINE NOT DRAINING PROPERLY ALONG WITH PAIN AND HEMATURIA. EMS WAS CALLED TO ASSIST AND TOOK HIM TO THE ED. HE WAS ADMITTED ON 2.1.25 WITH CONCERNS FOR POSSIBLE UTI AND BLOCKED CATHETER. PER DR. JOHN FELDMANN WITH [facility] THIS IS A RELATED HOSPITAL ADMISSION.    VISITED WITH PATIENT AT BEDSIDE. PATIENT IS SITTING IN THE CHAIR EATING BREAKFAST AT TIME OF MY VISIT. DISCUSSED CARE WITH DAUGHTER WHO HAS CONCERNS FOR ABILITY TO TAKE CARE OF PATIENT AT HOME GIVEN HIS CONDITION NOW. UROLOGY CONSULT PENDING.   PATIENT IS INPATIENT APPROPRIATE DUE TO NEED FOR IV ANTIBIOTICS AND CLOSE MONITORING FOR EFFECTIVENESS OF INTERVENTIONS FOR SUSPECTED SEPSIS SECONDARY TO COMPLICATED UTI.    VITAL SIGNS: 97.6/51/18    128/59    O2 100% ON 2LPM    I/O:  670/675   ABNORMAL LABS: CO2 33, CALCIUM  8.4, HGB 8.8, HCT 27.9   DIAGNOSTICS: NONE NEW   IV/PRN MEDS: AMPICILLIN  2G IV EVERY 4 HOURS     PROBLEM LIST PER MD NOTE AKULA, VIJAYA, MD 2.5.25:  ASSESSMENT AND PLAN:   SEPSIS SECONDARY TO COMPLICATED UTI: ENTEROCOCCUS BACTEREMIA : PATIENT HAS CHRONIC INDWELLING FOLEY CATHETER IN PLACE.   HE WAS NOTED TO BE FEBRILE IN THE ED WITH 103F, TACHYCARDIA MEETING SIRS CRITERIA. UA AFTER REPLACEMENT OF FOLEY CATHETER NOTED LARGE HEMOGLOBIN,  LEUKOCYTES,  MANY BACTERIA AND WBC. LACTIC ACID WAS REASSURING AT 1.5.  URINE AND BLOOD CULTURE WAS OBTAINED.   PRIOR CULTURES FROM 11/2022 GREW OUT KLEBSIELLA PNEUMONIA THAT APPEARS SENSITIVE TO ROCEPHIN .   PATIENT INITIATED ON EMPIRIC ANTIBIOTICS  (METRONIDAZOLE , VANCOMYCIN , AND CEFEPIME ). DE-ESCALATE ANTIBIOTICS AS CULTURES COMES BACK. RVP PANEL NEGATIVE.  BLOOD CULTURES GREW ENTEROCOCCUS FAECALIS. URINE CULTURE GREW ENTEROCOCCI AND PSEUDOMONAS.  ANTIBIOTICS CHANGED TO ZOSYN . REPEAT BLOOD CULTURES  ARE ORDERED, ID CONSULTED. ANTIBIOTICS CHANGED TO AMPICILLIN , ZOSYN  DISCONTINUED. TTE WITH NO OBVIOUS VEGETATIONS OR ENDOCARDITIS.  NO NEED FOR TEE.  REPEAT BLOOD CULTURES NEGATIVE.  PLAN TO TRANSITION TO ORAL AMOXICILLIN  TO COMPLETE 2 WEEK COURSE.    UTI DUE TO CLOGGED FOLEY CATHETER: HEMATURIA > RESOLVED. PATIENT PRESENTED DUE TO HEMATURIA AND NO URINE OUTPUT. UROLOGY HAD BEEN FORMALLY CONSULTED AND REPLACED A FOLEY CATHETER WHILE IN THE EMERGENCY DEPARTMENT. FOLLOW-UP WITH UROLOGY FOR POSSIBLE SUPRAPUBIC CATHETER OUTPATIENT. HEMATURIA RESOLVED.  URINE HAS BEEN CLEAR , DRAINING WELL. APPRECIATE UROLOGY CONSULTATIVE SERVICES.   NORMOCYTIC ANEMIA: ANEMIA OF CHRONIC DISEASE.  HEMOGLOBIN AROUND 8 AND STABLE.    HYPERKALEMIA: RESOLVED.    DEMENTIA WITH BEHAVIORAL DISTURBANCE: ALTERED MENTAL STATUS PROBABLY FROM UTI.  -DELIRIUM PRECAUTIONS -CONTINUE RISPERDAL  AND ATIVAN  AS NEEDED. - PT IS MORE ALERT AND RESPONDING TO QUESTIONS.    DISCHARGE PLANNING:? DISCHARGE BACK HOME ONCE MEDICALLY STABLE, DAUGHTER ASKING ABOUT POSSIBLE REHAB   FAMILY CONTACT: SPOKE WITH DAUGHTER BY PHONE   IDT: UPDATED   GOALS OF CARE: FULL CODE   IF PATIENT REQUIRES EMS TRANSPORT AT DISCHARGE, PLEASE US  [facility] AS THAT IS WHO [facility] IS CONTRACTED WITH FOR TRANSPORT.   PLEASE CALL WITH ANY HOSPICE RELATED QUESTIONS OR CONCERNS.   ELEANOR NAIL, LPN [facility] [phone]

## 2023-06-01 NOTE — TOC Progression Note (Addendum)
 Transition of Care [facility]) - Progression Note    Patient Details  Name: Alexander [name] MRN: [id number] Date of Birth: Feb 12, 1933  [facility] CM/SW Contact  Chesnie Capell [place], [place] Phone Number: 06/01/2023, 1:41 PM  Clinical Narrative: Per Eleanor with [facility] ([facility]), if pt's dtr revokes hospice benefit today then pt's payor will be traditional Medicare for the remainder of the month and will return to [facility] Advantage March 1st. Discussed this option with dtr and she would like to proceed with SNF for STR. Melissa with [facility] to reach out to dtr to complete revocation today.  Provided current SNF offers to pt's dtr and she has accepted [facility]. Confirmed bed with Jon in [facility] admissions. [facility] is prepared to admit pt tomorrow. MD updated. SW will follow.   Julien Das, MSW, LCSW [phone] (coverage)             Expected Discharge Plan and Services                                               Social Determinants of Health (SDOH) Interventions SDOH Screenings   Food Insecurity: No Food Insecurity (05/26/2023)  Housing: Low Risk  (05/26/2023)  Transportation Needs: No Transportation Needs (05/26/2023)  Utilities: Not At Risk (05/26/2023)  Depression (PHQ2-9): Low Risk  (12/16/2020)  Financial Resource Strain: Low Risk  (09/07/2017)  Physical Activity: Sufficiently Active (09/07/2017)  Social Connections: Moderately Isolated (05/26/2023)  Stress: No Stress Concern Present (09/07/2017)  Tobacco Use: Medium Risk (05/25/2023)    Readmission Risk Interventions    05/29/2023    3:53 PM 03/20/2023    2:33 PM 11/30/2022    9:58 AM  Readmission Risk Prevention Plan  Transportation Screening Complete Complete Complete  PCP or Specialist Appt within 5-7 Days   Complete  Home Care Screening   Complete  Medication Review (RN CM)   Complete  Medication Review (RN Care Manager) Referral to Pharmacy Referral to Pharmacy   PCP  or Specialist appointment within 3-5 days of discharge Complete Complete   HRI or Home Care Consult Complete Complete   SW Recovery Care/Counseling Consult Complete Complete   Palliative Care Screening Not Applicable Not Applicable   Skilled Nursing Facility Not Applicable Not Applicable

## 2023-06-01 NOTE — Progress Notes (Signed)
 [facility]                                                                               Alexander [name], is a 89 y.o. male, DOB - [birth year], FMW:[id number] Admit date - 05/25/2023    Outpatient Primary MD for the patient is Alexander Toribio MATSU, MD  LOS - 6  days    Brief summary    89 yrs. old male with medical history significant of hypertension, hyperlipidemia, nonobstructive CAD, COPD, BPH with chronic indwelling Foley, depression, and severe dementia currently on hospice who presented to the ED due to his Foley catheter not draining urine properly. He is accompanied by his daughter and son-in-law who provided his history.  Patient has enlarged prostate, and has been experiencing UTI almost every month.  Daughter reports his urine appeared concerning for infection, he has been having shaking and hallucinations which is not usual for him.  He is found to have fever of 103 in the ED.  Last UTI was in August with Klebsiella pneumonia identified in culture.  UA significant for many bacteria, leukocytes WBCs.  Blood and urine cultures were obtained.  Urology was consulted for difficult Foley insertion.  Patient was empirically started on antibiotics for suspected sepsis secondary to complicated UTI.    Assessment & Plan    Assessment and Plan:   Sepsis secondary to complicated UTI: Enterococcus Bacteremia : Patient has chronic indwelling Foley catheter in place.   He was noted to be febrile in the ED with 103F, tachycardia meeting SIRS criteria. UA after replacement of Foley catheter noted large hemoglobin,  leukocytes,  many bacteria and WBC. Lactic acid was reassuring at 1.5.  Urine and blood culture was obtained.   Prior cultures from 11/2022 grew out Klebsiella pneumonia that appears sensitive to Rocephin .   Patient initiated on empiric antibiotics  (metronidazole , vancomycin , and cefepime ). Transitioned to oral amoxicillin  on discharge.  RVP panel negative.  Blood  cultures grew Enterococcus faecalis. Urine culture grew enterococci and Pseudomonas. Repeat blood cultures are ordered negative so far. , ID consulted. Antibiotics changed to ampicillin , zosyn  discontinued. TTE with no obvious vegetations or endocarditis.  No need for TEE.  Transitioned to oral amoxicillin  to complete 2 week course.  No new events overnight. Plan for discharge to SNF tomorrow.    UTI due to clogged Foley catheter: Hematuria > Resolved. Patient presented due to hematuria and no urine output. Urology had been formally consulted and replaced a Foley catheter while in the emergency department. Follow-up with urology for possible suprapubic catheter outpatient. Hematuria resolved.  Urine has been clear , draining well. Urology consult appreciated   Normocytic anemia: Anemia of chronic disease.  Hemoglobin around 8 and stable.  Recheck labs today.    Hyperkalemia: Resolved.  Recheck BMP later today.   repeat K Is wnl.   Dementia with behavioral disturbance:  Worsening Altered mental status probably from UTI.  -Delirium precautions -Continue Risperdal  and Ativan  as needed. - no changes from yesterday.    Goals of care: Patient is in hospice at home presented with UTI.  Family wants patient to go to SNF for rehabilitation. Family wanted full code. Palliative care  consulted to clarify goals of care.  full scope of treatment .  TOC on board and plan for SNF in am.         RN Pressure Injury Documentation: Pressure Injury 02/11/23 Sacrum Mid Stage 1 -  Intact skin with non-blanchable redness of a localized area usually over a bony prominence. (Active)  02/11/23   Location: Sacrum  Location Orientation: Mid  Staging: Stage 1 -  Intact skin with non-blanchable redness of a localized area usually over a bony prominence.  Wound Description (Comments):   Present on Admission: No     Estimated body mass index is 21.82 kg/m as calculated from the following:    Height as of this encounter: 5' 8 (1.727 m).   Weight as of this encounter: 65.1 kg.  Code Status: full code DVT Prophylaxis:  enoxaparin  (LOVENOX ) injection 40 mg Start: 05/26/23 1000   Level of Care: Level of care: Med-Surg Family Communication: none at bedside today.   Disposition Plan:     Remains inpatient appropriate:  SNF placement.   Procedures:  TTE  Consultants:   ID  Antimicrobials:   Anti-infectives (From admission, onward)    Start     Dose/Rate Route Frequency Ordered Stop   05/30/23 1800  amoxicillin  (AMOXIL ) capsule 1,000 mg        1,000 mg Oral Every 8 hours 05/30/23 1604 06/10/23 0559   05/29/23 1400  ampicillin  (OMNIPEN) 2 g in sodium chloride  0.9 % 100 mL IVPB  Status:  Discontinued        2 g 300 mL/hr over 20 Minutes Intravenous Every 4 hours 05/29/23 0940 05/30/23 1604   05/28/23 1100  piperacillin -tazobactam (ZOSYN ) IVPB 3.375 g  Status:  Discontinued        3.375 g 12.5 mL/hr over 240 Minutes Intravenous Every 8 hours 05/28/23 1001 05/29/23 0940   05/27/23 0315  ampicillin  (OMNIPEN) 2 g in sodium chloride  0.9 % 100 mL IVPB  Status:  Discontinued        2 g 300 mL/hr over 20 Minutes Intravenous Every 4 hours 05/27/23 0220 05/28/23 1001   05/26/23 1200  cefTRIAXone  (ROCEPHIN ) 2 g in sodium chloride  0.9 % 100 mL IVPB  Status:  Discontinued        2 g 200 mL/hr over 30 Minutes Intravenous Every 24 hours 05/26/23 0749 05/27/23 0220   05/26/23 0500  ceFEPIme  (MAXIPIME ) 2 g in sodium chloride  0.9 % 100 mL IVPB        2 g 200 mL/hr over 30 Minutes Intravenous  Once 05/26/23 0458 05/26/23 0601   05/26/23 0500  metroNIDAZOLE  (FLAGYL ) IVPB 500 mg        500 mg 100 mL/hr over 60 Minutes Intravenous  Once 05/26/23 0458 05/26/23 0559   05/26/23 0500  vancomycin  (VANCOCIN ) IVPB 1000 mg/200 mL premix        1,000 mg 200 mL/hr over 60 Minutes Intravenous  Once 05/26/23 0458 05/26/23 0651        Medications  Scheduled Meds:  amoxicillin   1,000 mg Oral Q8H    Chlorhexidine  Gluconate Cloth  6 each Topical Daily   enoxaparin  (LOVENOX ) injection  40 mg Subcutaneous Daily   escitalopram   20 mg Oral QHS   feeding supplement  237 mL Oral BID BM   risperiDONE   1 mg Oral QHS   sodium chloride  flush  3 mL Intravenous Q12H   traZODone   100 mg Oral QHS   Continuous Infusions:   PRN Meds:.acetaminophen  **OR** acetaminophen , albuterol , LORazepam ,  ondansetron  **OR** ondansetron  (ZOFRAN ) IV, mouth rinse, polyethylene glycol    Subjective:   Alexander [name] was seen and examined today.   He remains calm, in bed. No agitation. No new events overnight.   Objective:   Vitals:   05/31/23 1537 05/31/23 2058 06/01/23 0500 06/01/23 0804  BP: 101/78 134/68 133/69 128/84  Pulse: 69 66 68 80  Resp: 18 18 18 18   Temp: 97.7 F (36.5 C) 97.9 F (36.6 C) 97.6 F (36.4 C) 97.6 F (36.4 C)  TempSrc: Oral Oral  Oral  SpO2: 100% 100% 100% 92%  Weight:      Height:        Intake/Output Summary (Last 24 hours) at 06/01/2023 1123 Last data filed at 06/01/2023 1037 Gross per 24 hour  Intake 443 ml  Output 1650 ml  Net -1207 ml   Filed Weights   05/25/23 2155  Weight: 65.1 kg     Exam General exam: elderly frail gentleman, not in distress  Respiratory system: on 2 lit of [place] oxygen.  Cardiovascular system: S1 & S2 heard, RRR.  Gastrointestinal system: Abdomen is nondistended, soft and nontender.  Central nervous system: Alert and oriented to self. Very hard of hearing,  Extremities: no edema.  Skin: No rashes,  Psychiatry: unable to assess       Data Reviewed:  I have personally reviewed following labs and imaging studies   CBC Lab Results  Component Value Date   WBC 10.2 05/27/2023   RBC 2.78 (L) 05/27/2023   HGB 8.8 (L) 05/30/2023   HCT 27.9 (L) 05/30/2023   MCV 97.5 05/27/2023   MCH 31.3 05/27/2023   PLT 189 05/27/2023   MCHC 32.1 05/27/2023   RDW 13.7 05/27/2023   LYMPHSABS 1.0 03/19/2023   MONOABS 1.6 (H) 03/19/2023   EOSABS  0.0 03/19/2023   BASOSABS 0.1 03/19/2023     Last metabolic panel Lab Results  Component Value Date   NA 141 05/30/2023   K 4.0 05/30/2023   CL 101 05/30/2023   CO2 33 (H) 05/30/2023   BUN 14 05/30/2023   CREATININE 0.71 05/30/2023   GLUCOSE 87 05/30/2023   GFRNONAA >60 05/30/2023   GFRAA >60 07/09/2019   CALCIUM  8.4 (L) 05/30/2023   PHOS 3.1 03/20/2023   PROT 5.2 (L) 05/26/2023   ALBUMIN 3.1 (L) 05/26/2023   BILITOT 1.0 05/26/2023   ALKPHOS 48 05/26/2023   AST 34 05/26/2023   ALT 14 05/26/2023   ANIONGAP 7 05/30/2023    CBG (last 3)  Recent Labs    05/30/23 0253  GLUCAP 83      Coagulation Profile: Recent Labs  Lab 05/26/23 0510  INR 1.2     Radiology Studies: ECHOCARDIOGRAM COMPLETE Result Date: 05/30/2023    ECHOCARDIOGRAM REPORT   Patient Name:   Alexander [name] Date of Exam: 05/30/2023 Medical Rec #:  [id number]         Height:       68.0 in Accession #:    [id number]        Weight:       143.5 lb Date of Birth:  02/18/1933        BSA:          1.775 m Patient Age:    89 years          BP:           122/55 mmHg Patient Gender: M  HR:           61 bpm. Exam Location:  Inpatient Procedure: 2D Echo, Cardiac Doppler and Color Doppler Indications:    Bacteremia  History:        Patient has prior history of Echocardiogram examinations, most                 recent 04/12/2020. COPD, Signs/Symptoms:Chest Pain, Dyspnea and                 Syncope; Risk Factors:Hypertension and Dyslipidemia.  Sonographer:    Thea Norlander RCS Referring Phys: [id number] GREGORY D CALONE IMPRESSIONS  1. Left ventricular ejection fraction, by estimation, is 55 to 60%. The left ventricle has normal function. The left ventricle has no regional wall motion abnormalities. Left ventricular diastolic parameters were normal.  2. Right ventricular systolic function is normal. The right ventricular size is mildly enlarged.  3. Right atrial size was mildly dilated.  4. Trivial mitral valve  regurgitation.  5. The aortic valve was not well visualized. Aortic valve regurgitation is not visualized. No aortic stenosis is present.  6. The inferior vena cava is dilated in size with >50% respiratory variability, suggesting right atrial pressure of 8 mmHg. FINDINGS  Left Ventricle: Left ventricular ejection fraction, by estimation, is 55 to 60%. The left ventricle has normal function. The left ventricle has no regional wall motion abnormalities. The left ventricular internal cavity size was normal in size. There is  no left ventricular hypertrophy. Left ventricular diastolic parameters were normal. Right Ventricle: The right ventricular size is mildly enlarged. Right vetricular wall thickness was not assessed. Right ventricular systolic function is normal. Left Atrium: Left atrial size was normal in size. Right Atrium: Right atrial size was mildly dilated. Pericardium: There is no evidence of pericardial effusion. Mitral Valve: There is mild thickening of the mitral valve leaflet(s). Trivial mitral valve regurgitation. Tricuspid Valve: The tricuspid valve is normal in structure. Tricuspid valve regurgitation is mild. Aortic Valve: The aortic valve was not well visualized. Aortic valve regurgitation is not visualized. No aortic stenosis is present. Aortic valve peak gradient measures 8.3 mmHg. Pulmonic Valve: The pulmonic valve was not well visualized. Pulmonic valve regurgitation is not visualized. Aorta: The aortic root and ascending aorta are structurally normal, with no evidence of dilitation. Venous: The inferior vena cava is dilated in size with greater than 50% respiratory variability, suggesting right atrial pressure of 8 mmHg. IAS/Shunts: No atrial level shunt detected by color flow Doppler.  LEFT VENTRICLE PLAX 2D LVIDd:         5.10 cm   Diastology LVIDs:         3.60 cm   LV e' medial:    9.68 cm/s LV PW:         0.90 cm   LV E/e' medial:  9.4 LV IVS:        0.90 cm   LV e' lateral:   9.46 cm/s LVOT  diam:     2.20 cm   LV E/e' lateral: 9.6 LV SV:         79 LV SV Index:   44 LVOT Area:     3.80 cm  RIGHT VENTRICLE             IVC RV S prime:     18.30 cm/s  IVC diam: 2.10 cm TAPSE (M-mode): 2.2 cm LEFT ATRIUM             Index  RIGHT ATRIUM           Index LA diam:        3.30 cm 1.86 cm/m   RA Area:     18.70 cm LA Vol (A2C):   33.9 ml 19.10 ml/m  RA Volume:   51.00 ml  28.73 ml/m LA Vol (A4C):   42.6 ml 24.00 ml/m LA Biplane Vol: 38.2 ml 21.52 ml/m  AORTIC VALVE AV Area (Vmax): 2.75 cm AV Vmax:        144.00 cm/s AV Peak Grad:   8.3 mmHg LVOT Vmax:      104.33 cm/s LVOT Vmean:     65.267 cm/s LVOT VTI:       0.207 m  AORTA Ao Root diam: 3.70 cm Ao Asc diam:  3.70 cm MITRAL VALVE               TRICUSPID VALVE MV Area (PHT): 3.77 cm    TR Peak grad:   50.4 mmHg MV Decel Time: 201 msec    TR Vmax:        355.00 cm/s MV E velocity: 91.20 cm/s                            SHUNTS                            Systemic VTI:  0.21 m                            Systemic Diam: 2.20 cm Vina Gull MD Electronically signed by Vina Gull MD Signature Date/Time: 05/30/2023/2:25:07 PM    Final        Elgie Butter M.D. [facility] 06/01/2023, 11:23 AM  Available via Epic secure chat 7am-7pm After 7 pm, please refer to night coverage provider listed on amion.

## 2023-06-01 NOTE — Plan of Care (Signed)

## 2023-06-01 NOTE — Plan of Care (Signed)
  Problem: Education: Goal: Knowledge of General Education information will improve Description: Including pain rating scale, medication(s)/side effects and non-pharmacologic comfort measures 06/01/2023 1736 by Val Violeta FALCON, RN Outcome: Progressing 06/01/2023 1735 by Val Violeta FALCON, RN Outcome: Progressing 06/01/2023 1345 by Val Violeta FALCON, RN Outcome: Progressing   Problem: Health Behavior/Discharge Planning: Goal: Ability to manage health-related needs will improve 06/01/2023 1736 by Val Violeta FALCON, RN Outcome: Progressing 06/01/2023 1735 by Val Violeta FALCON, RN Outcome: Progressing 06/01/2023 1345 by Val Violeta FALCON, RN Outcome: Progressing   Problem: Clinical Measurements: Goal: Ability to maintain clinical measurements within normal limits will improve 06/01/2023 1736 by Val Violeta FALCON, RN Outcome: Progressing 06/01/2023 1735 by Val Violeta FALCON, RN Outcome: Progressing 06/01/2023 1345 by Val Violeta FALCON, RN Outcome: Progressing Goal: Will remain free from infection 06/01/2023 1736 by Val Violeta FALCON, RN Outcome: Progressing 06/01/2023 1735 by Val Violeta FALCON, RN Outcome: Progressing 06/01/2023 1345 by Val Violeta FALCON, RN Outcome: Progressing Goal: Diagnostic test results will improve 06/01/2023 1736 by Val Violeta FALCON, RN Outcome: Progressing 06/01/2023 1735 by Val Violeta FALCON, RN Outcome: Progressing 06/01/2023 1345 by Val Violeta FALCON, RN Outcome: Progressing Goal: Respiratory complications will improve 06/01/2023 1736 by Val Violeta FALCON, RN Outcome: Progressing 06/01/2023 1735 by Val Violeta FALCON, RN Outcome: Progressing 06/01/2023 1345 by Val Violeta FALCON, RN Outcome: Progressing Goal: Cardiovascular complication will be avoided 06/01/2023 1736 by Val Violeta FALCON, RN Outcome: Progressing 06/01/2023 1735 by Val Violeta FALCON, RN Outcome: Progressing 06/01/2023 1345 by Val Violeta FALCON, RN Outcome: Progressing   Problem:  Activity: Goal: Risk for activity intolerance will decrease 06/01/2023 1736 by Val Violeta FALCON, RN Outcome: Progressing 06/01/2023 1735 by Val Violeta FALCON, RN Outcome: Progressing 06/01/2023 1345 by Val Violeta FALCON, RN Outcome: Progressing   Problem: Nutrition: Goal: Adequate nutrition will be maintained 06/01/2023 1736 by Val Violeta FALCON, RN Outcome: Progressing 06/01/2023 1735 by Val Violeta FALCON, RN Outcome: Progressing 06/01/2023 1345 by Val Violeta FALCON, RN Outcome: Progressing   Problem: Coping: Goal: Level of anxiety will decrease 06/01/2023 1736 by Val Violeta FALCON, RN Outcome: Progressing 06/01/2023 1735 by Val Violeta FALCON, RN Outcome: Progressing 06/01/2023 1345 by Val Violeta FALCON, RN Outcome: Progressing   Problem: Elimination: Goal: Will not experience complications related to bowel motility 06/01/2023 1736 by Val Violeta FALCON, RN Outcome: Progressing 06/01/2023 1735 by Val Violeta FALCON, RN Outcome: Progressing 06/01/2023 1345 by Val Violeta FALCON, RN Outcome: Progressing Goal: Will not experience complications related to urinary retention 06/01/2023 1736 by Val Violeta FALCON, RN Outcome: Progressing 06/01/2023 1735 by Val Violeta FALCON, RN Outcome: Progressing 06/01/2023 1345 by Val Violeta FALCON, RN Outcome: Progressing   Problem: Pain Managment: Goal: General experience of comfort will improve and/or be controlled 06/01/2023 1736 by Val Violeta FALCON, RN Outcome: Progressing 06/01/2023 1735 by Val Violeta FALCON, RN Outcome: Progressing 06/01/2023 1345 by Val Violeta FALCON, RN Outcome: Progressing   Problem: Safety: Goal: Ability to remain free from injury will improve 06/01/2023 1736 by Val Violeta FALCON, RN Outcome: Progressing 06/01/2023 1735 by Val Violeta FALCON, RN Outcome: Progressing 06/01/2023 1345 by Val Violeta FALCON, RN Outcome: Progressing   Problem: Skin Integrity: Goal: Risk for impaired skin integrity will decrease 06/01/2023 1736  by Val Violeta FALCON, RN Outcome: Progressing 06/01/2023 1735 by Val Violeta FALCON, RN Outcome: Progressing 06/01/2023 1345 by Val Violeta FALCON, RN Outcome: Progressing

## 2023-06-01 NOTE — NC FL2 (Signed)
 [place]  MEDICAID FL2 LEVEL OF CARE FORM     IDENTIFICATION  Patient Name: Alexander [name] Birthdate: 1933-01-15 Sex: male Admission Date (Current Location): 05/25/2023  [facility] and [place] Number:  Producer, Television/film/video and Address:  [facility] [place], [address] [place], [place] [address]      Provider Number: [id number]  Attending Physician Name and Address:  Cherlyn Labella, MD  Relative Name and Phone Number:       Current Level of Care:   Recommended Level of Care: Skilled Nursing Facility Prior Approval Number:    Date Approved/Denied:   PASRR Number: [id number]  Discharge Plan: SNF    Current Diagnoses: Patient Active Problem List   Diagnosis Date Noted   Bacteremia due to Enterococcus 05/29/2023   Complication of Foley catheter (HCC) 05/26/2023   Hyperkalemia 05/26/2023   Complicated UTI (urinary tract infection) 03/19/2023   Acute cystitis without hematuria 02/19/2023   Pressure injury of skin 02/19/2023   Fever 02/08/2023   Acute UTI (urinary tract infection) 02/08/2023   Malnutrition of moderate degree 12/05/2022   Bacteremia due to Klebsiella pneumoniae 12/01/2022   Sepsis (HCC) 11/29/2022   COPD exacerbation (HCC) 11/29/2020   Acute hypoxemic respiratory failure (HCC) 11/29/2020   Moderate dementia with behavioral disturbance 11/29/2020   COVID-19 04/11/2020   Syncope and collapse 04/11/2020   Right shoulder pain 02/19/2019   Hematochezia 01/03/2019   Bilateral hearing loss 10/06/2018   Dementia without behavioral disturbance (HCC) 09/02/2018   Acute respiratory failure with hypoxia (HCC) 09/02/2018   Rash 05/30/2018   Blood in ear canal, left 05/21/2018   Dermatitis 04/12/2018   Preventative health care 11/27/2017   Weight loss 08/27/2017   Abdominal discomfort, generalized 08/27/2017   Normocytic anemia 08/27/2017   Hyponatremia 08/27/2017   Chronic obstructive pulmonary disease (HCC) 06/05/2017   Chronic  pansinusitis 06/05/2017   BPH with obstruction/lower urinary tract symptoms 01/30/2017   Gynecomastia, male 09/06/2016   Chest pain 05/21/2016   Hyperglycemia 05/09/2016   Depression 05/09/2016   Rhinitis, chronic 05/09/2016   Upper airway cough syndrome 03/18/2016   Hypertensive heart disease    Dark stools 11/14/2015   Screening for prostate cancer 05/06/2015   Thrush 05/06/2015   DOE (dyspnea on exertion) 07/31/2014   Constipation 01/27/2014   Glaucoma    Erectile dysfunction    Left ventricular hypertrophy 12/01/2012   GERD (gastroesophageal reflux disease) 10/28/2012   COPD, moderate (HCC) 10/22/2012   HLD (hyperlipidemia) 10/11/2009   Essential hypertension 10/11/2009   Cough, persistent 10/11/2009    Orientation RESPIRATION BLADDER Height & Weight     Self  Normal Incontinent Weight: 143 lb 8.3 oz (65.1 kg) Height:  5' 8 (172.7 cm)  BEHAVIORAL SYMPTOMS/MOOD NEUROLOGICAL BOWEL NUTRITION STATUS      Incontinent    AMBULATORY STATUS COMMUNICATION OF NEEDS Skin   Extensive Assist Verbally                         Personal Care Assistance Level of Assistance  Bathing, Feeding, Dressing Bathing Assistance: Maximum assistance Feeding assistance: Maximum assistance Dressing Assistance: Maximum assistance     Functional Limitations Info  Sight, Hearing, Speech Sight Info: Adequate Hearing Info: Adequate Speech Info: Adequate    SPECIAL CARE FACTORS FREQUENCY  PT (By licensed PT), OT (By licensed OT)                    Contractures Contractures Info: Not present  Additional Factors Info  Code Status Code Status Info: FULL CODE             Current Medications (06/01/2023):  This is the current hospital active medication list Current Facility-Administered Medications  Medication Dose Route Frequency Provider Last Rate Last Admin   acetaminophen  (TYLENOL ) tablet 650 mg  650 mg Oral Q6H PRN Smith, Rondell A, MD       Or   acetaminophen   (TYLENOL ) suppository 650 mg  650 mg Rectal Q6H PRN Claudene Reeves A, MD       albuterol  (PROVENTIL ) (2.5 MG/3ML) 0.083% nebulizer solution 2.5 mg  2.5 mg Nebulization Q6H PRN Claudene Reeves LABOR, MD       amoxicillin  (AMOXIL ) capsule 1,000 mg  1,000 mg Oral Q8H Manandhar, Sabina, MD   1,000 mg at 06/01/23 9375   Chlorhexidine  Gluconate Cloth 2 % PADS 6 each  6 each Topical Daily Claudene Reeves A, MD   6 each at 05/31/23 1013   enoxaparin  (LOVENOX ) injection 40 mg  40 mg Subcutaneous Daily Smith, Rondell A, MD   40 mg at 06/01/23 9072   escitalopram  (LEXAPRO ) tablet 20 mg  20 mg Oral QHS Smith, Rondell A, MD   20 mg at 05/31/23 2009   feeding supplement (ENSURE ENLIVE / ENSURE PLUS) liquid 237 mL  237 mL Oral BID BM Leotis Bogus, MD   237 mL at 05/31/23 1413   LORazepam  (ATIVAN ) tablet 0.5 mg  0.5 mg Oral Q4H PRN Claudene Reeves A, MD       ondansetron  (ZOFRAN ) tablet 4 mg  4 mg Oral Q6H PRN Claudene Reeves A, MD       Or   ondansetron  (ZOFRAN ) injection 4 mg  4 mg Intravenous Q6H PRN Smith, Rondell A, MD       Oral care mouth rinse  15 mL Mouth Rinse PRN Leotis Bogus, MD       polyethylene glycol (MIRALAX  / GLYCOLAX ) packet 17 g  17 g Oral Daily PRN Smith, Rondell A, MD   17 g at 05/30/23 1156   risperiDONE  (RISPERDAL ) tablet 1 mg  1 mg Oral QHS Smith, Rondell A, MD   1 mg at 05/31/23 2009   sodium chloride  flush (NS) 0.9 % injection 3 mL  3 mL Intravenous Q12H Smith, Rondell A, MD   3 mL at 05/31/23 2009   traZODone  (DESYREL ) tablet 100 mg  100 mg Oral QHS Claudene Gauze M, NP   100 mg at 05/31/23 2009     Discharge Medications: Please see discharge summary for a list of discharge medications.  Relevant Imaging Results:  Relevant Lab Results:   Additional Information SS# [id number] [place], [place]

## 2023-06-02 DIAGNOSIS — A4189 Other specified sepsis: Secondary | ICD-10-CM | POA: Diagnosis not present

## 2023-06-02 DIAGNOSIS — E875 Hyperkalemia: Secondary | ICD-10-CM | POA: Diagnosis not present

## 2023-06-02 DIAGNOSIS — J449 Chronic obstructive pulmonary disease, unspecified: Secondary | ICD-10-CM | POA: Diagnosis not present

## 2023-06-02 DIAGNOSIS — N401 Enlarged prostate with lower urinary tract symptoms: Secondary | ICD-10-CM | POA: Diagnosis not present

## 2023-06-02 DIAGNOSIS — F02A18 Dementia in other diseases classified elsewhere, mild, with other behavioral disturbance: Secondary | ICD-10-CM | POA: Diagnosis not present

## 2023-06-02 DIAGNOSIS — R410 Disorientation, unspecified: Secondary | ICD-10-CM | POA: Diagnosis not present

## 2023-06-02 DIAGNOSIS — N390 Urinary tract infection, site not specified: Secondary | ICD-10-CM | POA: Diagnosis not present

## 2023-06-02 DIAGNOSIS — R7881 Bacteremia: Secondary | ICD-10-CM | POA: Diagnosis not present

## 2023-06-02 DIAGNOSIS — F32A Depression, unspecified: Secondary | ICD-10-CM | POA: Diagnosis not present

## 2023-06-02 DIAGNOSIS — I2510 Atherosclerotic heart disease of native coronary artery without angina pectoris: Secondary | ICD-10-CM | POA: Diagnosis not present

## 2023-06-02 DIAGNOSIS — D649 Anemia, unspecified: Secondary | ICD-10-CM | POA: Diagnosis not present

## 2023-06-02 DIAGNOSIS — I10 Essential (primary) hypertension: Secondary | ICD-10-CM | POA: Diagnosis not present

## 2023-06-02 LAB — POTASSIUM: Potassium: 5.1 mmol/L (ref 3.5–5.1)

## 2023-06-02 MED ORDER — LORAZEPAM 0.5 MG PO TABS: 0.5000 mg | ORAL_TABLET | Freq: Every evening | ORAL | 0 refills | Status: AC | PRN

## 2023-06-02 MED ORDER — ENSURE ENLIVE PO LIQD: 237.0000 mL | Freq: Two times a day (BID) | ORAL | 2 refills | Status: AC

## 2023-06-02 MED ORDER — AMOXICILLIN 500 MG PO CAPS: 1000.0000 mg | ORAL_CAPSULE | Freq: Three times a day (TID) | ORAL | 0 refills | Status: AC

## 2023-06-02 MED ORDER — SODIUM ZIRCONIUM CYCLOSILICATE 10 G PO PACK
10.0000 g | PACK | Freq: Two times a day (BID) | ORAL | Status: DC
  Administered 2023-06-02: 10 g via ORAL
  Filled 2023-06-02: qty 1

## 2023-06-02 MED ORDER — BISACODYL 10 MG RE SUPP
10.0000 mg | Freq: Once | RECTAL | Status: AC
  Administered 2023-06-02: 10 mg via RECTAL
  Filled 2023-06-02: qty 1

## 2023-06-02 NOTE — Progress Notes (Signed)
 Report given to Kim at [employer] [facility] [employer].

## 2023-06-02 NOTE — TOC Transition Note (Signed)
 Transition of Care [facility]) - Discharge Note   Patient Details  Name: Alexander [name] MRN: [id number] Date of Birth: March 31, 1933  Transition of Care [facility]) CM/SW Contact:  Gwenn Julien [place], [place] Phone Number: 06/02/2023, 12:04 PM   Clinical Narrative:  Pt for dc to [facility]. Spoke to [facility] in admissions who confirmed they are prepared to admit pt to room 3221. [name] aware of dc and reports agreeable. RN provided with number for report and PTAR arranged for transport. SW signing off at dc.   Julien Gwenn, MSW, LCSW [phone] (coverage)       Final next level of care: Skilled Nursing Facility Barriers to Discharge: Barriers Resolved   Patient Goals and CMS Choice            Discharge Placement              Patient chooses bed at: [facility] Patient to be transferred to facility by: PTAR Name of family member notified: Sonja/Dtr Patient and family notified of of transfer: 06/02/23  Discharge Plan and Services Additional resources added to the After Visit Summary for                                       Social Drivers of Health (SDOH) Interventions SDOH Screenings   Food Insecurity: No Food Insecurity (05/26/2023)  Housing: Low Risk  (05/26/2023)  Transportation Needs: No Transportation Needs (05/26/2023)  Utilities: Not At Risk (05/26/2023)  Depression (PHQ2-9): Low Risk  (12/16/2020)  Financial Resource Strain: Low Risk  (09/07/2017)  Physical Activity: Sufficiently Active (09/07/2017)  Social Connections: Moderately Isolated (05/26/2023)  Stress: No Stress Concern Present (09/07/2017)  Tobacco Use: Medium Risk (05/25/2023)     Readmission Risk Interventions    05/29/2023    3:53 PM 03/20/2023    2:33 PM 11/30/2022    9:58 AM  Readmission Risk Prevention Plan  Transportation Screening Complete Complete Complete  PCP or Specialist Appt within 5-7 Days   Complete  Home Care Screening   Complete  Medication Review (RN CM)    Complete  Medication Review (RN Care Manager) Referral to Pharmacy Referral to Pharmacy   PCP or Specialist appointment within 3-5 days of discharge Complete Complete   HRI or Home Care Consult Complete Complete   SW Recovery Care/Counseling Consult Complete Complete   Palliative Care Screening Not Applicable Not Applicable   Skilled Nursing Facility Not Applicable Not Applicable
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
## 2023-06-04 DIAGNOSIS — N401 Enlarged prostate with lower urinary tract symptoms: Secondary | ICD-10-CM | POA: Diagnosis not present

## 2023-06-04 DIAGNOSIS — I10 Essential (primary) hypertension: Secondary | ICD-10-CM | POA: Diagnosis not present

## 2023-06-04 DIAGNOSIS — F02A18 Dementia in other diseases classified elsewhere, mild, with other behavioral disturbance: Secondary | ICD-10-CM | POA: Diagnosis not present

## 2023-06-04 DIAGNOSIS — J449 Chronic obstructive pulmonary disease, unspecified: Secondary | ICD-10-CM | POA: Diagnosis not present

## 2023-06-04 DIAGNOSIS — R7881 Bacteremia: Secondary | ICD-10-CM | POA: Diagnosis not present

## 2023-06-04 DIAGNOSIS — N390 Urinary tract infection, site not specified: Secondary | ICD-10-CM | POA: Diagnosis not present

## 2023-06-04 DIAGNOSIS — A4189 Other specified sepsis: Secondary | ICD-10-CM | POA: Diagnosis not present

## 2023-06-04 DIAGNOSIS — F32A Depression, unspecified: Secondary | ICD-10-CM | POA: Diagnosis not present

## 2023-06-04 DIAGNOSIS — I2510 Atherosclerotic heart disease of native coronary artery without angina pectoris: Secondary | ICD-10-CM | POA: Diagnosis not present

## 2023-06-04 DIAGNOSIS — E875 Hyperkalemia: Secondary | ICD-10-CM | POA: Diagnosis not present

## 2023-06-05 DIAGNOSIS — J449 Chronic obstructive pulmonary disease, unspecified: Secondary | ICD-10-CM | POA: Diagnosis not present

## 2023-06-05 DIAGNOSIS — A4189 Other specified sepsis: Secondary | ICD-10-CM | POA: Diagnosis not present

## 2023-06-05 DIAGNOSIS — N401 Enlarged prostate with lower urinary tract symptoms: Secondary | ICD-10-CM | POA: Diagnosis not present

## 2023-06-05 DIAGNOSIS — F02A18 Dementia in other diseases classified elsewhere, mild, with other behavioral disturbance: Secondary | ICD-10-CM | POA: Diagnosis not present

## 2023-06-05 DIAGNOSIS — I10 Essential (primary) hypertension: Secondary | ICD-10-CM | POA: Diagnosis not present

## 2023-06-05 DIAGNOSIS — I2510 Atherosclerotic heart disease of native coronary artery without angina pectoris: Secondary | ICD-10-CM | POA: Diagnosis not present

## 2023-06-05 DIAGNOSIS — E875 Hyperkalemia: Secondary | ICD-10-CM | POA: Diagnosis not present

## 2023-06-05 DIAGNOSIS — N390 Urinary tract infection, site not specified: Secondary | ICD-10-CM | POA: Diagnosis not present

## 2023-06-05 DIAGNOSIS — F32A Depression, unspecified: Secondary | ICD-10-CM | POA: Diagnosis not present

## 2023-06-05 DIAGNOSIS — R7881 Bacteremia: Secondary | ICD-10-CM | POA: Diagnosis not present

## 2023-06-06 DIAGNOSIS — F32A Depression, unspecified: Secondary | ICD-10-CM | POA: Diagnosis not present

## 2023-06-06 DIAGNOSIS — A4189 Other specified sepsis: Secondary | ICD-10-CM | POA: Diagnosis not present

## 2023-06-06 DIAGNOSIS — N401 Enlarged prostate with lower urinary tract symptoms: Secondary | ICD-10-CM | POA: Diagnosis not present

## 2023-06-06 DIAGNOSIS — E875 Hyperkalemia: Secondary | ICD-10-CM | POA: Diagnosis not present

## 2023-06-06 DIAGNOSIS — J449 Chronic obstructive pulmonary disease, unspecified: Secondary | ICD-10-CM | POA: Diagnosis not present

## 2023-06-06 DIAGNOSIS — R7881 Bacteremia: Secondary | ICD-10-CM | POA: Diagnosis not present

## 2023-06-06 DIAGNOSIS — F02A18 Dementia in other diseases classified elsewhere, mild, with other behavioral disturbance: Secondary | ICD-10-CM | POA: Diagnosis not present

## 2023-06-06 DIAGNOSIS — I2510 Atherosclerotic heart disease of native coronary artery without angina pectoris: Secondary | ICD-10-CM | POA: Diagnosis not present

## 2023-06-06 DIAGNOSIS — N390 Urinary tract infection, site not specified: Secondary | ICD-10-CM | POA: Diagnosis not present

## 2023-06-06 DIAGNOSIS — I10 Essential (primary) hypertension: Secondary | ICD-10-CM | POA: Diagnosis not present

## 2023-06-07 DIAGNOSIS — F32A Depression, unspecified: Secondary | ICD-10-CM | POA: Diagnosis not present

## 2023-06-07 DIAGNOSIS — I10 Essential (primary) hypertension: Secondary | ICD-10-CM | POA: Diagnosis not present

## 2023-06-07 DIAGNOSIS — E875 Hyperkalemia: Secondary | ICD-10-CM | POA: Diagnosis not present

## 2023-06-07 DIAGNOSIS — N401 Enlarged prostate with lower urinary tract symptoms: Secondary | ICD-10-CM | POA: Diagnosis not present

## 2023-06-07 DIAGNOSIS — I2510 Atherosclerotic heart disease of native coronary artery without angina pectoris: Secondary | ICD-10-CM | POA: Diagnosis not present

## 2023-06-07 DIAGNOSIS — N390 Urinary tract infection, site not specified: Secondary | ICD-10-CM | POA: Diagnosis not present

## 2023-06-07 DIAGNOSIS — F02A18 Dementia in other diseases classified elsewhere, mild, with other behavioral disturbance: Secondary | ICD-10-CM | POA: Diagnosis not present

## 2023-06-07 DIAGNOSIS — J449 Chronic obstructive pulmonary disease, unspecified: Secondary | ICD-10-CM | POA: Diagnosis not present

## 2023-06-07 DIAGNOSIS — R7881 Bacteremia: Secondary | ICD-10-CM | POA: Diagnosis not present

## 2023-06-07 DIAGNOSIS — A4189 Other specified sepsis: Secondary | ICD-10-CM | POA: Diagnosis not present

## 2023-06-08 DIAGNOSIS — I10 Essential (primary) hypertension: Secondary | ICD-10-CM | POA: Diagnosis not present

## 2023-06-08 DIAGNOSIS — N390 Urinary tract infection, site not specified: Secondary | ICD-10-CM | POA: Diagnosis not present

## 2023-06-08 DIAGNOSIS — N401 Enlarged prostate with lower urinary tract symptoms: Secondary | ICD-10-CM | POA: Diagnosis not present

## 2023-06-08 DIAGNOSIS — A4189 Other specified sepsis: Secondary | ICD-10-CM | POA: Diagnosis not present

## 2023-06-08 DIAGNOSIS — F32A Depression, unspecified: Secondary | ICD-10-CM | POA: Diagnosis not present

## 2023-06-08 DIAGNOSIS — F02A18 Dementia in other diseases classified elsewhere, mild, with other behavioral disturbance: Secondary | ICD-10-CM | POA: Diagnosis not present

## 2023-06-08 DIAGNOSIS — E875 Hyperkalemia: Secondary | ICD-10-CM | POA: Diagnosis not present

## 2023-06-08 DIAGNOSIS — R7881 Bacteremia: Secondary | ICD-10-CM | POA: Diagnosis not present

## 2023-06-08 DIAGNOSIS — I2510 Atherosclerotic heart disease of native coronary artery without angina pectoris: Secondary | ICD-10-CM | POA: Diagnosis not present

## 2023-06-08 DIAGNOSIS — J449 Chronic obstructive pulmonary disease, unspecified: Secondary | ICD-10-CM | POA: Diagnosis not present

## 2023-06-09 DIAGNOSIS — A4189 Other specified sepsis: Secondary | ICD-10-CM | POA: Diagnosis not present

## 2023-06-09 DIAGNOSIS — I2510 Atherosclerotic heart disease of native coronary artery without angina pectoris: Secondary | ICD-10-CM | POA: Diagnosis not present

## 2023-06-09 DIAGNOSIS — R7881 Bacteremia: Secondary | ICD-10-CM | POA: Diagnosis not present

## 2023-06-09 DIAGNOSIS — J449 Chronic obstructive pulmonary disease, unspecified: Secondary | ICD-10-CM | POA: Diagnosis not present

## 2023-06-09 DIAGNOSIS — F32A Depression, unspecified: Secondary | ICD-10-CM | POA: Diagnosis not present

## 2023-06-09 DIAGNOSIS — E875 Hyperkalemia: Secondary | ICD-10-CM | POA: Diagnosis not present

## 2023-06-09 DIAGNOSIS — N401 Enlarged prostate with lower urinary tract symptoms: Secondary | ICD-10-CM | POA: Diagnosis not present

## 2023-06-09 DIAGNOSIS — F02A18 Dementia in other diseases classified elsewhere, mild, with other behavioral disturbance: Secondary | ICD-10-CM | POA: Diagnosis not present

## 2023-06-09 DIAGNOSIS — N390 Urinary tract infection, site not specified: Secondary | ICD-10-CM | POA: Diagnosis not present

## 2023-06-09 DIAGNOSIS — I10 Essential (primary) hypertension: Secondary | ICD-10-CM | POA: Diagnosis not present

## 2023-06-11 DIAGNOSIS — I2510 Atherosclerotic heart disease of native coronary artery without angina pectoris: Secondary | ICD-10-CM | POA: Diagnosis not present

## 2023-06-11 DIAGNOSIS — R7881 Bacteremia: Secondary | ICD-10-CM | POA: Diagnosis not present

## 2023-06-11 DIAGNOSIS — F02A18 Dementia in other diseases classified elsewhere, mild, with other behavioral disturbance: Secondary | ICD-10-CM | POA: Diagnosis not present

## 2023-06-11 DIAGNOSIS — A4189 Other specified sepsis: Secondary | ICD-10-CM | POA: Diagnosis not present

## 2023-06-11 DIAGNOSIS — J449 Chronic obstructive pulmonary disease, unspecified: Secondary | ICD-10-CM | POA: Diagnosis not present

## 2023-06-11 DIAGNOSIS — J811 Chronic pulmonary edema: Secondary | ICD-10-CM | POA: Diagnosis not present

## 2023-06-11 DIAGNOSIS — I10 Essential (primary) hypertension: Secondary | ICD-10-CM | POA: Diagnosis not present

## 2023-06-11 DIAGNOSIS — F32A Depression, unspecified: Secondary | ICD-10-CM | POA: Diagnosis not present

## 2023-06-11 DIAGNOSIS — N390 Urinary tract infection, site not specified: Secondary | ICD-10-CM | POA: Diagnosis not present

## 2023-06-11 DIAGNOSIS — N401 Enlarged prostate with lower urinary tract symptoms: Secondary | ICD-10-CM | POA: Diagnosis not present

## 2023-06-11 DIAGNOSIS — E875 Hyperkalemia: Secondary | ICD-10-CM | POA: Diagnosis not present

## 2023-06-12 DIAGNOSIS — F32A Depression, unspecified: Secondary | ICD-10-CM | POA: Diagnosis not present

## 2023-06-12 DIAGNOSIS — N401 Enlarged prostate with lower urinary tract symptoms: Secondary | ICD-10-CM | POA: Diagnosis not present

## 2023-06-12 DIAGNOSIS — I10 Essential (primary) hypertension: Secondary | ICD-10-CM | POA: Diagnosis not present

## 2023-06-12 DIAGNOSIS — J449 Chronic obstructive pulmonary disease, unspecified: Secondary | ICD-10-CM | POA: Diagnosis not present

## 2023-06-12 DIAGNOSIS — E875 Hyperkalemia: Secondary | ICD-10-CM | POA: Diagnosis not present

## 2023-06-12 DIAGNOSIS — N390 Urinary tract infection, site not specified: Secondary | ICD-10-CM | POA: Diagnosis not present

## 2023-06-12 DIAGNOSIS — F02A18 Dementia in other diseases classified elsewhere, mild, with other behavioral disturbance: Secondary | ICD-10-CM | POA: Diagnosis not present

## 2023-06-12 DIAGNOSIS — R7881 Bacteremia: Secondary | ICD-10-CM | POA: Diagnosis not present

## 2023-06-12 DIAGNOSIS — I2510 Atherosclerotic heart disease of native coronary artery without angina pectoris: Secondary | ICD-10-CM | POA: Diagnosis not present

## 2023-06-12 DIAGNOSIS — A4189 Other specified sepsis: Secondary | ICD-10-CM | POA: Diagnosis not present

## 2023-06-13 DIAGNOSIS — F32A Depression, unspecified: Secondary | ICD-10-CM | POA: Diagnosis not present

## 2023-06-13 DIAGNOSIS — F02A18 Dementia in other diseases classified elsewhere, mild, with other behavioral disturbance: Secondary | ICD-10-CM | POA: Diagnosis not present

## 2023-06-13 DIAGNOSIS — J449 Chronic obstructive pulmonary disease, unspecified: Secondary | ICD-10-CM | POA: Diagnosis not present

## 2023-06-13 DIAGNOSIS — N390 Urinary tract infection, site not specified: Secondary | ICD-10-CM | POA: Diagnosis not present

## 2023-06-13 DIAGNOSIS — R7881 Bacteremia: Secondary | ICD-10-CM | POA: Diagnosis not present

## 2023-06-13 DIAGNOSIS — N401 Enlarged prostate with lower urinary tract symptoms: Secondary | ICD-10-CM | POA: Diagnosis not present

## 2023-06-13 DIAGNOSIS — I10 Essential (primary) hypertension: Secondary | ICD-10-CM | POA: Diagnosis not present

## 2023-06-13 DIAGNOSIS — A4189 Other specified sepsis: Secondary | ICD-10-CM | POA: Diagnosis not present

## 2023-06-13 DIAGNOSIS — E875 Hyperkalemia: Secondary | ICD-10-CM | POA: Diagnosis not present

## 2023-06-13 DIAGNOSIS — I2510 Atherosclerotic heart disease of native coronary artery without angina pectoris: Secondary | ICD-10-CM | POA: Diagnosis not present

## 2023-06-15 DIAGNOSIS — J449 Chronic obstructive pulmonary disease, unspecified: Secondary | ICD-10-CM | POA: Diagnosis not present

## 2023-06-15 DIAGNOSIS — N390 Urinary tract infection, site not specified: Secondary | ICD-10-CM | POA: Diagnosis not present

## 2023-06-15 DIAGNOSIS — R7881 Bacteremia: Secondary | ICD-10-CM | POA: Diagnosis not present

## 2023-06-15 DIAGNOSIS — A4189 Other specified sepsis: Secondary | ICD-10-CM | POA: Diagnosis not present

## 2023-06-15 DIAGNOSIS — I2510 Atherosclerotic heart disease of native coronary artery without angina pectoris: Secondary | ICD-10-CM | POA: Diagnosis not present

## 2023-06-15 DIAGNOSIS — F32A Depression, unspecified: Secondary | ICD-10-CM | POA: Diagnosis not present

## 2023-06-15 DIAGNOSIS — F02A18 Dementia in other diseases classified elsewhere, mild, with other behavioral disturbance: Secondary | ICD-10-CM | POA: Diagnosis not present

## 2023-06-15 DIAGNOSIS — I10 Essential (primary) hypertension: Secondary | ICD-10-CM | POA: Diagnosis not present

## 2023-06-15 DIAGNOSIS — E875 Hyperkalemia: Secondary | ICD-10-CM | POA: Diagnosis not present

## 2023-06-15 DIAGNOSIS — N401 Enlarged prostate with lower urinary tract symptoms: Secondary | ICD-10-CM | POA: Diagnosis not present

## 2023-06-18 DIAGNOSIS — A4189 Other specified sepsis: Secondary | ICD-10-CM | POA: Diagnosis not present

## 2023-06-18 DIAGNOSIS — F02A18 Dementia in other diseases classified elsewhere, mild, with other behavioral disturbance: Secondary | ICD-10-CM | POA: Diagnosis not present

## 2023-06-18 DIAGNOSIS — N401 Enlarged prostate with lower urinary tract symptoms: Secondary | ICD-10-CM | POA: Diagnosis not present

## 2023-06-18 DIAGNOSIS — R7881 Bacteremia: Secondary | ICD-10-CM | POA: Diagnosis not present

## 2023-06-18 DIAGNOSIS — J449 Chronic obstructive pulmonary disease, unspecified: Secondary | ICD-10-CM | POA: Diagnosis not present

## 2023-06-18 DIAGNOSIS — N390 Urinary tract infection, site not specified: Secondary | ICD-10-CM | POA: Diagnosis not present

## 2023-06-18 DIAGNOSIS — I10 Essential (primary) hypertension: Secondary | ICD-10-CM | POA: Diagnosis not present

## 2023-06-18 DIAGNOSIS — I2510 Atherosclerotic heart disease of native coronary artery without angina pectoris: Secondary | ICD-10-CM | POA: Diagnosis not present

## 2023-06-18 DIAGNOSIS — E875 Hyperkalemia: Secondary | ICD-10-CM | POA: Diagnosis not present

## 2023-06-18 DIAGNOSIS — F32A Depression, unspecified: Secondary | ICD-10-CM | POA: Diagnosis not present

## 2023-06-19 DIAGNOSIS — F32A Depression, unspecified: Secondary | ICD-10-CM | POA: Diagnosis not present

## 2023-06-19 DIAGNOSIS — I2510 Atherosclerotic heart disease of native coronary artery without angina pectoris: Secondary | ICD-10-CM | POA: Diagnosis not present

## 2023-06-19 DIAGNOSIS — F02A18 Dementia in other diseases classified elsewhere, mild, with other behavioral disturbance: Secondary | ICD-10-CM | POA: Diagnosis not present

## 2023-06-19 DIAGNOSIS — E875 Hyperkalemia: Secondary | ICD-10-CM | POA: Diagnosis not present

## 2023-06-19 DIAGNOSIS — A4189 Other specified sepsis: Secondary | ICD-10-CM | POA: Diagnosis not present

## 2023-06-19 DIAGNOSIS — N390 Urinary tract infection, site not specified: Secondary | ICD-10-CM | POA: Diagnosis not present

## 2023-06-19 DIAGNOSIS — I10 Essential (primary) hypertension: Secondary | ICD-10-CM | POA: Diagnosis not present

## 2023-06-19 DIAGNOSIS — N401 Enlarged prostate with lower urinary tract symptoms: Secondary | ICD-10-CM | POA: Diagnosis not present

## 2023-06-19 DIAGNOSIS — J449 Chronic obstructive pulmonary disease, unspecified: Secondary | ICD-10-CM | POA: Diagnosis not present

## 2023-06-19 DIAGNOSIS — R7881 Bacteremia: Secondary | ICD-10-CM | POA: Diagnosis not present

## 2023-06-20 DIAGNOSIS — J449 Chronic obstructive pulmonary disease, unspecified: Secondary | ICD-10-CM | POA: Diagnosis not present

## 2023-06-20 DIAGNOSIS — R7881 Bacteremia: Secondary | ICD-10-CM | POA: Diagnosis not present

## 2023-06-20 DIAGNOSIS — I2510 Atherosclerotic heart disease of native coronary artery without angina pectoris: Secondary | ICD-10-CM | POA: Diagnosis not present

## 2023-06-20 DIAGNOSIS — A4189 Other specified sepsis: Secondary | ICD-10-CM | POA: Diagnosis not present

## 2023-06-20 DIAGNOSIS — N401 Enlarged prostate with lower urinary tract symptoms: Secondary | ICD-10-CM | POA: Diagnosis not present

## 2023-06-20 DIAGNOSIS — N390 Urinary tract infection, site not specified: Secondary | ICD-10-CM | POA: Diagnosis not present

## 2023-06-20 DIAGNOSIS — I10 Essential (primary) hypertension: Secondary | ICD-10-CM | POA: Diagnosis not present

## 2023-06-20 DIAGNOSIS — F32A Depression, unspecified: Secondary | ICD-10-CM | POA: Diagnosis not present

## 2023-06-20 DIAGNOSIS — F02A18 Dementia in other diseases classified elsewhere, mild, with other behavioral disturbance: Secondary | ICD-10-CM | POA: Diagnosis not present

## 2023-06-20 DIAGNOSIS — E875 Hyperkalemia: Secondary | ICD-10-CM | POA: Diagnosis not present

## 2023-06-21 DIAGNOSIS — R062 Wheezing: Secondary | ICD-10-CM | POA: Diagnosis not present

## 2023-06-21 DIAGNOSIS — J449 Chronic obstructive pulmonary disease, unspecified: Secondary | ICD-10-CM | POA: Diagnosis not present

## 2023-06-22 ENCOUNTER — Inpatient Hospital Stay (HOSPITAL_COMMUNITY)
Admission: EM | Admit: 2023-06-22 | Discharge: 2023-06-29 | DRG: 177 | Disposition: A | Discharge status: Hospice | Payer: PPO | Source: Skilled Nursing Facility | Attending: Family Medicine | Admitting: Family Medicine

## 2023-06-22 ENCOUNTER — Other Ambulatory Visit: Payer: Self-pay

## 2023-06-22 ENCOUNTER — Emergency Department (HOSPITAL_COMMUNITY): Payer: PPO

## 2023-06-22 DIAGNOSIS — E875 Hyperkalemia: Secondary | ICD-10-CM | POA: Diagnosis not present

## 2023-06-22 DIAGNOSIS — E871 Hypo-osmolality and hyponatremia: Secondary | ICD-10-CM | POA: Diagnosis not present

## 2023-06-22 DIAGNOSIS — F03B18 Unspecified dementia, moderate, with other behavioral disturbance: Secondary | ICD-10-CM | POA: Diagnosis not present

## 2023-06-22 DIAGNOSIS — D649 Anemia, unspecified: Secondary | ICD-10-CM | POA: Diagnosis not present

## 2023-06-22 DIAGNOSIS — L89152 Pressure ulcer of sacral region, stage 2: Secondary | ICD-10-CM | POA: Diagnosis not present

## 2023-06-22 DIAGNOSIS — J9602 Acute respiratory failure with hypercapnia: Secondary | ICD-10-CM | POA: Diagnosis present

## 2023-06-22 DIAGNOSIS — E870 Hyperosmolality and hypernatremia: Secondary | ICD-10-CM | POA: Diagnosis not present

## 2023-06-22 DIAGNOSIS — R0902 Hypoxemia: Secondary | ICD-10-CM | POA: Diagnosis not present

## 2023-06-22 DIAGNOSIS — Z79899 Other long term (current) drug therapy: Secondary | ICD-10-CM

## 2023-06-22 DIAGNOSIS — Z1152 Encounter for screening for COVID-19: Secondary | ICD-10-CM | POA: Diagnosis not present

## 2023-06-22 DIAGNOSIS — J4489 Other specified chronic obstructive pulmonary disease: Secondary | ICD-10-CM | POA: Diagnosis present

## 2023-06-22 DIAGNOSIS — G309 Alzheimer's disease, unspecified: Secondary | ICD-10-CM | POA: Diagnosis not present

## 2023-06-22 DIAGNOSIS — A4189 Other specified sepsis: Secondary | ICD-10-CM | POA: Diagnosis not present

## 2023-06-22 DIAGNOSIS — Z8249 Family history of ischemic heart disease and other diseases of the circulatory system: Secondary | ICD-10-CM

## 2023-06-22 DIAGNOSIS — J690 Pneumonitis due to inhalation of food and vomit: Secondary | ICD-10-CM | POA: Diagnosis not present

## 2023-06-22 DIAGNOSIS — I700 Atherosclerosis of aorta: Secondary | ICD-10-CM | POA: Diagnosis not present

## 2023-06-22 DIAGNOSIS — J449 Chronic obstructive pulmonary disease, unspecified: Secondary | ICD-10-CM | POA: Diagnosis not present

## 2023-06-22 DIAGNOSIS — R0602 Shortness of breath: Secondary | ICD-10-CM | POA: Diagnosis not present

## 2023-06-22 DIAGNOSIS — I10 Essential (primary) hypertension: Secondary | ICD-10-CM | POA: Diagnosis present

## 2023-06-22 DIAGNOSIS — Z7189 Other specified counseling: Secondary | ICD-10-CM | POA: Diagnosis not present

## 2023-06-22 DIAGNOSIS — E860 Dehydration: Secondary | ICD-10-CM | POA: Diagnosis not present

## 2023-06-22 DIAGNOSIS — J9601 Acute respiratory failure with hypoxia: Secondary | ICD-10-CM | POA: Diagnosis present

## 2023-06-22 DIAGNOSIS — F02C18 Dementia in other diseases classified elsewhere, severe, with other behavioral disturbance: Secondary | ICD-10-CM | POA: Diagnosis not present

## 2023-06-22 DIAGNOSIS — R0603 Acute respiratory distress: Secondary | ICD-10-CM | POA: Diagnosis present

## 2023-06-22 DIAGNOSIS — J439 Emphysema, unspecified: Secondary | ICD-10-CM | POA: Diagnosis not present

## 2023-06-22 DIAGNOSIS — N401 Enlarged prostate with lower urinary tract symptoms: Secondary | ICD-10-CM | POA: Diagnosis present

## 2023-06-22 DIAGNOSIS — R0689 Other abnormalities of breathing: Secondary | ICD-10-CM | POA: Diagnosis not present

## 2023-06-22 DIAGNOSIS — K219 Gastro-esophageal reflux disease without esophagitis: Secondary | ICD-10-CM | POA: Diagnosis present

## 2023-06-22 DIAGNOSIS — J90 Pleural effusion, not elsewhere classified: Secondary | ICD-10-CM | POA: Diagnosis not present

## 2023-06-22 DIAGNOSIS — F419 Anxiety disorder, unspecified: Secondary | ICD-10-CM | POA: Diagnosis present

## 2023-06-22 DIAGNOSIS — Z8042 Family history of malignant neoplasm of prostate: Secondary | ICD-10-CM

## 2023-06-22 DIAGNOSIS — J189 Pneumonia, unspecified organism: Secondary | ICD-10-CM

## 2023-06-22 DIAGNOSIS — G9341 Metabolic encephalopathy: Secondary | ICD-10-CM | POA: Diagnosis present

## 2023-06-22 DIAGNOSIS — R0600 Dyspnea, unspecified: Secondary | ICD-10-CM | POA: Diagnosis not present

## 2023-06-22 DIAGNOSIS — F32A Depression, unspecified: Secondary | ICD-10-CM | POA: Diagnosis not present

## 2023-06-22 DIAGNOSIS — Y846 Urinary catheterization as the cause of abnormal reaction of the patient, or of later complication, without mention of misadventure at the time of the procedure: Secondary | ICD-10-CM | POA: Diagnosis present

## 2023-06-22 DIAGNOSIS — I2510 Atherosclerotic heart disease of native coronary artery without angina pectoris: Secondary | ICD-10-CM | POA: Diagnosis present

## 2023-06-22 DIAGNOSIS — Z87891 Personal history of nicotine dependence: Secondary | ICD-10-CM

## 2023-06-22 DIAGNOSIS — R7881 Bacteremia: Secondary | ICD-10-CM | POA: Diagnosis not present

## 2023-06-22 DIAGNOSIS — Z800 Family history of malignant neoplasm of digestive organs: Secondary | ICD-10-CM

## 2023-06-22 DIAGNOSIS — R64 Cachexia: Secondary | ICD-10-CM | POA: Diagnosis present

## 2023-06-22 DIAGNOSIS — T83511A Infection and inflammatory reaction due to indwelling urethral catheter, initial encounter: Secondary | ICD-10-CM | POA: Diagnosis not present

## 2023-06-22 DIAGNOSIS — I6782 Cerebral ischemia: Secondary | ICD-10-CM | POA: Diagnosis not present

## 2023-06-22 DIAGNOSIS — N390 Urinary tract infection, site not specified: Secondary | ICD-10-CM | POA: Diagnosis present

## 2023-06-22 DIAGNOSIS — Z83438 Family history of other disorder of lipoprotein metabolism and other lipidemia: Secondary | ICD-10-CM

## 2023-06-22 DIAGNOSIS — Z515 Encounter for palliative care: Secondary | ICD-10-CM

## 2023-06-22 DIAGNOSIS — R7401 Elevation of levels of liver transaminase levels: Secondary | ICD-10-CM | POA: Diagnosis present

## 2023-06-22 DIAGNOSIS — F02A18 Dementia in other diseases classified elsewhere, mild, with other behavioral disturbance: Secondary | ICD-10-CM | POA: Diagnosis not present

## 2023-06-22 DIAGNOSIS — Z66 Do not resuscitate: Secondary | ICD-10-CM | POA: Diagnosis not present

## 2023-06-22 DIAGNOSIS — M6250 Muscle wasting and atrophy, not elsewhere classified, unspecified site: Secondary | ICD-10-CM | POA: Diagnosis present

## 2023-06-22 DIAGNOSIS — E785 Hyperlipidemia, unspecified: Secondary | ICD-10-CM | POA: Diagnosis not present

## 2023-06-22 DIAGNOSIS — Z681 Body mass index (BMI) 19 or less, adult: Secondary | ICD-10-CM

## 2023-06-22 DIAGNOSIS — R918 Other nonspecific abnormal finding of lung field: Secondary | ICD-10-CM | POA: Diagnosis not present

## 2023-06-22 DIAGNOSIS — R4182 Altered mental status, unspecified: Secondary | ICD-10-CM | POA: Diagnosis not present

## 2023-06-22 DIAGNOSIS — R7989 Other specified abnormal findings of blood chemistry: Secondary | ICD-10-CM | POA: Diagnosis present

## 2023-06-22 DIAGNOSIS — Z9049 Acquired absence of other specified parts of digestive tract: Secondary | ICD-10-CM

## 2023-06-22 DIAGNOSIS — R748 Abnormal levels of other serum enzymes: Secondary | ICD-10-CM | POA: Diagnosis present

## 2023-06-22 DIAGNOSIS — Z823 Family history of stroke: Secondary | ICD-10-CM

## 2023-06-22 LAB — URINALYSIS, ROUTINE W REFLEX MICROSCOPIC
Bilirubin Urine: NEGATIVE
Glucose, UA: NEGATIVE mg/dL
Ketones, ur: NEGATIVE mg/dL
Nitrite: POSITIVE — AB
Protein, ur: 100 mg/dL — AB
RBC / HPF: >50 RBC/hpf (ref 0–5)
Specific Gravity, Urine: 1.032 — ABNORMAL HIGH (ref 1.005–1.030)
WBC, UA: >50 WBC/hpf (ref 0–5)
pH: 5 (ref 5.0–8.0)

## 2023-06-22 LAB — COMPREHENSIVE METABOLIC PANEL WITH GFR
ALT: 182 U/L — ABNORMAL HIGH (ref 0–44)
AST: 127 U/L — ABNORMAL HIGH (ref 15–41)
Albumin: 3 g/dL — ABNORMAL LOW (ref 3.5–5.0)
Alkaline Phosphatase: 133 U/L — ABNORMAL HIGH (ref 38–126)
Anion gap: 9 (ref 5–15)
BUN: 24 mg/dL — ABNORMAL HIGH (ref 8–23)
CO2: 32 mmol/L (ref 22–32)
Calcium: 8.7 mg/dL — ABNORMAL LOW (ref 8.9–10.3)
Chloride: 107 mmol/L (ref 98–111)
Creatinine, Ser: 0.86 mg/dL (ref 0.61–1.24)
GFR, Estimated: >60 mL/min
Glucose, Bld: 90 mg/dL (ref 70–99)
Potassium: 3.9 mmol/L (ref 3.5–5.1)
Sodium: 148 mmol/L — ABNORMAL HIGH (ref 135–145)
Total Bilirubin: 0.6 mg/dL (ref 0.0–1.2)
Total Protein: 6 g/dL — ABNORMAL LOW (ref 6.5–8.1)

## 2023-06-22 LAB — BLOOD GAS, VENOUS
Acid-Base Excess: 1.7 mmol/L (ref 0.0–2.0)
Bicarbonate: 30.1 mmol/L — ABNORMAL HIGH (ref 20.0–28.0)
O2 Saturation: 43.2 %
Patient temperature: 37
pCO2, Ven: 64 mm[Hg] — ABNORMAL HIGH (ref 44–60)
pH, Ven: 7.28 (ref 7.25–7.43)
pO2, Ven: 33 mm[Hg] (ref 32–45)

## 2023-06-22 LAB — CBC
HCT: 34.2 % — ABNORMAL LOW (ref 39.0–52.0)
Hemoglobin: 10.3 g/dL — ABNORMAL LOW (ref 13.0–17.0)
MCH: 31 pg (ref 26.0–34.0)
MCHC: 30.1 g/dL (ref 30.0–36.0)
MCV: 103 fL — ABNORMAL HIGH (ref 80.0–100.0)
Platelets: 307 K/uL (ref 150–400)
RBC: 3.32 MIL/uL — ABNORMAL LOW (ref 4.22–5.81)
RDW: 14.8 % (ref 11.5–15.5)
WBC: 6.1 K/uL (ref 4.0–10.5)
nRBC: 0 % (ref 0.0–0.2)

## 2023-06-22 LAB — RESP PANEL BY RT-PCR (RSV, FLU A&B, COVID)  RVPGX2
Influenza A by PCR: NEGATIVE
Influenza B by PCR: NEGATIVE
Resp Syncytial Virus by PCR: NEGATIVE
SARS Coronavirus 2 by RT PCR: NEGATIVE

## 2023-06-22 LAB — ETHANOL: Alcohol, Ethyl (B): <10 mg/dL (ref <10)

## 2023-06-22 LAB — AMMONIA: Ammonia: <10 umol/L (ref 9–35)

## 2023-06-22 LAB — TROPONIN I (HIGH SENSITIVITY): Troponin I (High Sensitivity): 82 ng/L — ABNORMAL HIGH (ref <18)

## 2023-06-22 MED ORDER — ALBUTEROL SULFATE (2.5 MG/3ML) 0.083% IN NEBU: 2.5000 mg | INHALATION_SOLUTION | RESPIRATORY_TRACT | Status: DC | PRN

## 2023-06-22 MED ORDER — ENOXAPARIN SODIUM 40 MG/0.4ML IJ SOSY
40.0000 mg | PREFILLED_SYRINGE | INTRAMUSCULAR | Status: DC
  Administered 2023-06-22 – 2023-06-28 (×7): 40 mg via SUBCUTANEOUS
  Filled 2023-06-22 (×7): qty 0.4

## 2023-06-22 MED ORDER — KCL IN DEXTROSE-NACL 20-5-0.9 MEQ/L-%-% IV SOLN
INTRAVENOUS | Status: DC
  Filled 2023-06-22 (×2): qty 1000

## 2023-06-22 MED ORDER — PIPERACILLIN-TAZOBACTAM 3.375 G IVPB 30 MIN
3.3750 g | Freq: Once | INTRAVENOUS | Status: AC
  Administered 2023-06-22: 3.375 g via INTRAVENOUS
  Filled 2023-06-22: qty 50

## 2023-06-22 MED ORDER — SODIUM CHLORIDE 0.9 % IV BOLUS
1000.0000 mL | Freq: Once | INTRAVENOUS | Status: AC
  Administered 2023-06-22: 1000 mL via INTRAVENOUS

## 2023-06-22 MED ORDER — VANCOMYCIN HCL 1250 MG/250ML IV SOLN: 1250.0000 mg | Freq: Once | INTRAVENOUS | Status: DC

## 2023-06-22 MED ORDER — ONDANSETRON HCL 4 MG/2ML IJ SOLN: 4.0000 mg | Freq: Four times a day (QID) | INTRAMUSCULAR | Status: DC | PRN

## 2023-06-22 MED ORDER — PIPERACILLIN-TAZOBACTAM 3.375 G IVPB
3.3750 g | Freq: Three times a day (TID) | INTRAVENOUS | Status: DC
  Administered 2023-06-22 – 2023-06-23 (×2): 3.375 g via INTRAVENOUS
  Filled 2023-06-22 (×2): qty 50

## 2023-06-22 MED ORDER — ACETAMINOPHEN 650 MG RE SUPP: 650.0000 mg | Freq: Four times a day (QID) | RECTAL | Status: DC | PRN

## 2023-06-22 MED ORDER — SODIUM CHLORIDE 0.9 % IV SOLN: 250.0000 mL | INTRAVENOUS | Status: AC | PRN

## 2023-06-22 MED ORDER — VANCOMYCIN HCL IN DEXTROSE 1-5 GM/200ML-% IV SOLN
1000.0000 mg | INTRAVENOUS | Status: DC
  Administered 2023-06-23 – 2023-06-25 (×3): 1000 mg via INTRAVENOUS
  Filled 2023-06-22 (×3): qty 200

## 2023-06-22 MED ORDER — HYDRALAZINE HCL 20 MG/ML IJ SOLN: 5.0000 mg | Freq: Four times a day (QID) | INTRAMUSCULAR | Status: DC | PRN

## 2023-06-22 MED ORDER — SODIUM CHLORIDE 0.9% FLUSH
3.0000 mL | Freq: Two times a day (BID) | INTRAVENOUS | Status: DC
  Administered 2023-06-22 – 2023-06-28 (×9): 3 mL via INTRAVENOUS

## 2023-06-22 MED ORDER — ONDANSETRON HCL 4 MG PO TABS: 4.0000 mg | ORAL_TABLET | Freq: Four times a day (QID) | ORAL | Status: DC | PRN

## 2023-06-22 MED ORDER — SODIUM CHLORIDE 0.9% FLUSH: 3.0000 mL | INTRAVENOUS | Status: DC | PRN

## 2023-06-22 MED ORDER — ACETAMINOPHEN 325 MG PO TABS: 650.0000 mg | ORAL_TABLET | Freq: Four times a day (QID) | ORAL | Status: DC | PRN

## 2023-06-22 MED ORDER — VANCOMYCIN HCL 1500 MG/300ML IV SOLN
1500.0000 mg | Freq: Once | INTRAVENOUS | Status: AC
  Administered 2023-06-22: 1500 mg via INTRAVENOUS
  Filled 2023-06-22: qty 300

## 2023-06-22 NOTE — ED Notes (Signed)
 This RN attempted multiple time to obtain PIV and blood work ordered without success. Adam, RN to attempt USGIV.
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
## 2023-06-22 NOTE — Progress Notes (Signed)
 Pharmacy Antibiotic Note  Alexander [name] is a 89 y.o. male with chronic indwelling foley cath who was recently hospitalized from 05/25/23 to 06/02/23 for sepsis secondary to UTI and enterococcus bacteremia. During this admission, TTE was negative for vegetation and he was treated with ampicillin inpatient and discharged with amoxicillin for a 2 week course (EOT 06/10/23).  He presented back to the ED on 06/22/2023 from [facility] for AMS. CXR on 05/27/23 showed "bibasilar atelectasis or infiltrates."  Pharmacy has been consulted to dose vancomycin and zosyn for PNA.    Plan: - vancomycin 1500 mg IV given in the ED at 4pm, then 1000 mg IV q24h for est AUC 430 - zosyn 3.375 gm IV q8h (infuse over 4 hrs) - daily scr while on vancomycin and zosyn  ____________________________________________  Temp (24hrs), Avg:97.7 F (36.5 C), Min:97.6 F (36.4 C), Max:97.7 F (36.5 C)  Recent Labs  Lab 06/22/23 1407  WBC 6.1  CREATININE 0.86    CrCl cannot be calculated (Unknown ideal weight.).    No Known Allergies   Thank you for allowing pharmacy to be a part of this patient's care.  Lucia Gaskins 06/22/2023 5:03 PM

## 2023-06-22 NOTE — Hospital Course (Addendum)
 Brief Narrative:  89 year old with history of HTN, HLD, nonobstructive CAD, COPD, BPH with chronic indwelling Foley catheter, depression, severe dementia presented from SNF for altered mental status and mumbling.  He has not been eating well or drinking for the past few days.  Recently admitted  from 1/31 - 2/8 UTI tx and eventually discharged to SNF on p.o. antibiotics.  Upon admission noted to have severe hypoxia likely from aspiration requiring 10 L nasal cannula.   Assessment & Plan:  Principal Problem:   Acute metabolic encephalopathy Active Problems:   Normocytic anemia   Moderate dementia with behavioral disturbance   Essential hypertension   COPD, moderate (HCC)   GERD (gastroesophageal reflux disease)   Acute respiratory failure with hypoxia (HCC)     Acute metabolic encephalopathy Acute hypoxic respiratory failure secondary to aspiration pneumonia -Initially noted to be severely hypoxic requiring 10 L nasal cannula.  Chest x-ray Consistent with bilateral infiltrates.  EDP and admitting provider confirmed that patient is DNR/DNI. -Continue IV Vanc & meropenem (previous cultures grew Enterococcus and Pseudomas) -Bronchodilators scheduled and as needed.  I-S/flutter valve. -Ammonia levels normal, CT head negative. -Check stat VBG, Accu-Cheks every 6 hours.  D5 half-normal saline.  Acute hypoxic respiratory failure with hypercarbia CO2 narcosis -Will attempt trial of BiPAP.  Patient is DNR/DNI.  Repeat VBG in about 2 hours to see if he responds. -Family aware of his critical condition   History of COPD   Will continue with oxygen nebulizers.   History of BPH with chronic indwelling Foley catheter UTI   Recent sepsis secondary to complicated UTI.  There was a plan for urology follow-up for possible suprapubic catheter as outpatient.  UA is still suggestive of UTI.  Already on antibiotics.   Normocytic anemia Recent hemoglobin of 10.3.  Will continue to monitor.   Dementia  with behavioral disturbance History of depression. Currently with worsening mentation.  On Risperdal and Ativan as outpatient.   Mild hypernatremia Secondary to dehydration continue IV fluids  Transaminitis - Trend LFTs   Goals of care Confirmed DNR/DNI Palliative re-consulted.   Family aware about poor prognosis, Will consult palliative as well.   DVT prophylaxis: enoxaparin (LOVENOX) injection 40 mg Start: 06/22/23 2200    Code Status: Limited: Do not attempt resuscitation (DNR) -DNR-LIMITED -Do Not Intubate/DNI  Family Communication:  Son & Daughter updated.  Status is: Inpatient Remains inpatient appropriate because: Cont hospital stay.   Subjective:  Patient remains obtunded. No meaningful interaction.  Per daughter he was participating in therapy and walking around up until 2 days ago.   I had extensive discussion with patient's daughter and 2 sons regarding his critical condition.  They are aware that his mentation could be related to CO2 narcosis and/or worsening infection.  They agree that patient should remain DNR/DNI but in the meantime we will try as much as possible.  They will further discuss long-term goals of care with palliative care.  Examination:  General exam: Appears to be calm but obtunded.  On nonrebreather.  Cachectic frail, muscle wasting Respiratory system: Mild lateral rhonchi Cardiovascular system: S1 & S2 heard, RRR. No JVD, murmurs, rubs, gallops or clicks. No pedal edema. Gastrointestinal system: Abdomen is nondistended, soft and nontender. No organomegaly or masses felt. Normal bowel sounds heard. Central nervous system: Unable to assess Extremities: No swelling in lower extremities bilaterally. Skin: No rashes, lesions or ulcers Psychiatry: Will to assess Catheter in place

## 2023-06-22 NOTE — Progress Notes (Signed)
 ED Pharmacy Antibiotic Sign Off An antibiotic consult was received from an ED provider for zosyn per pharmacy dosing for PNA. A chart review was completed to assess appropriateness.   The following one time order(s) were placed:  - Zosyn 3.375 gm x1 (infuse over 30 min)  - will adjust vancomycin dose to 1500 mg IV x1 for weight and indication   Further antibiotic and/or antibiotic pharmacy consults should be ordered by the admitting provider if indicated.   Thank you for allowing pharmacy to be a part of this patient's care.   Lucia Gaskins, [facility]  Clinical Pharmacist 06/22/23 3:24 PM

## 2023-06-22 NOTE — Progress Notes (Signed)
 [facility] liaison note     This patient is a former patient with [facility] and is scheduled for visit on Monday to restart Hospice Services   Liaisons will continue to follow for any discharge planning needs and to coordinate continuation of hospice care.    Please don't hesitate to call with any Hospice related questions or concerns.    Thank you for the opportunity to participate in this patient's care.  Glenna Fellows, BSN, RN, OCN [employer] [phone]

## 2023-06-23 ENCOUNTER — Inpatient Hospital Stay (HOSPITAL_COMMUNITY)

## 2023-06-23 DIAGNOSIS — F32A Depression, unspecified: Secondary | ICD-10-CM | POA: Diagnosis not present

## 2023-06-23 DIAGNOSIS — I10 Essential (primary) hypertension: Secondary | ICD-10-CM | POA: Diagnosis not present

## 2023-06-23 DIAGNOSIS — F02A18 Dementia in other diseases classified elsewhere, mild, with other behavioral disturbance: Secondary | ICD-10-CM | POA: Diagnosis not present

## 2023-06-23 DIAGNOSIS — R0603 Acute respiratory distress: Secondary | ICD-10-CM | POA: Diagnosis present

## 2023-06-23 DIAGNOSIS — G9341 Metabolic encephalopathy: Secondary | ICD-10-CM | POA: Diagnosis not present

## 2023-06-23 DIAGNOSIS — A4189 Other specified sepsis: Secondary | ICD-10-CM | POA: Diagnosis not present

## 2023-06-23 DIAGNOSIS — J449 Chronic obstructive pulmonary disease, unspecified: Secondary | ICD-10-CM | POA: Diagnosis not present

## 2023-06-23 DIAGNOSIS — E875 Hyperkalemia: Secondary | ICD-10-CM | POA: Diagnosis not present

## 2023-06-23 DIAGNOSIS — I2510 Atherosclerotic heart disease of native coronary artery without angina pectoris: Secondary | ICD-10-CM | POA: Diagnosis not present

## 2023-06-23 DIAGNOSIS — R7881 Bacteremia: Secondary | ICD-10-CM | POA: Diagnosis not present

## 2023-06-23 DIAGNOSIS — N390 Urinary tract infection, site not specified: Secondary | ICD-10-CM | POA: Diagnosis not present

## 2023-06-23 DIAGNOSIS — N401 Enlarged prostate with lower urinary tract symptoms: Secondary | ICD-10-CM | POA: Diagnosis not present

## 2023-06-23 LAB — BLOOD GAS, VENOUS
Acid-Base Excess: 8.7 mmol/L — ABNORMAL HIGH (ref 0.0–2.0)
Bicarbonate: 38.1 mmol/L — ABNORMAL HIGH (ref 20.0–28.0)
O2 Saturation: 67.7 %
Patient temperature: 37
pCO2, Ven: 85 mmHg (ref 44–60)
pH, Ven: 7.26 (ref 7.25–7.43)
pO2, Ven: 44 mmHg (ref 32–45)

## 2023-06-23 LAB — BASIC METABOLIC PANEL
Anion gap: 4 — ABNORMAL LOW (ref 5–15)
BUN: 19 mg/dL (ref 8–23)
CO2: 34 mmol/L — ABNORMAL HIGH (ref 22–32)
Calcium: 8.4 mg/dL — ABNORMAL LOW (ref 8.9–10.3)
Chloride: 111 mmol/L (ref 98–111)
Creatinine, Ser: 0.79 mg/dL (ref 0.61–1.24)
GFR, Estimated: >60 mL/min (ref >60)
Glucose, Bld: 94 mg/dL (ref 70–99)
Potassium: 4.2 mmol/L (ref 3.5–5.1)
Sodium: 149 mmol/L — ABNORMAL HIGH (ref 135–145)

## 2023-06-23 LAB — POCT I-STAT 7, (LYTES, BLD GAS, ICA,H+H)
Acid-Base Excess: 5 mmol/L — ABNORMAL HIGH (ref 0.0–2.0)
Bicarbonate: 31.8 mmol/L — ABNORMAL HIGH (ref 20.0–28.0)
Calcium, Ion: 1.3 mmol/L (ref 1.15–1.40)
HCT: 34 % — ABNORMAL LOW (ref 39.0–52.0)
Hemoglobin: 11.6 g/dL — ABNORMAL LOW (ref 13.0–17.0)
O2 Saturation: 88 %
Potassium: 4 mmol/L (ref 3.5–5.1)
Sodium: 150 mmol/L — ABNORMAL HIGH (ref 135–145)
TCO2: 34 mmol/L — ABNORMAL HIGH (ref 22–32)
pCO2 arterial: 58.2 mmHg — ABNORMAL HIGH (ref 32–48)
pH, Arterial: 7.346 — ABNORMAL LOW (ref 7.35–7.45)
pO2, Arterial: 59 mmHg — ABNORMAL LOW (ref 83–108)

## 2023-06-23 LAB — MRSA NEXT GEN BY PCR, NASAL: MRSA by PCR Next Gen: NOT DETECTED

## 2023-06-23 LAB — MAGNESIUM: Magnesium: 2.1 mg/dL (ref 1.7–2.4)

## 2023-06-23 LAB — CBC
HCT: 32.2 % — ABNORMAL LOW (ref 39.0–52.0)
Hemoglobin: 9.5 g/dL — ABNORMAL LOW (ref 13.0–17.0)
MCH: 30.8 pg (ref 26.0–34.0)
MCHC: 29.5 g/dL — ABNORMAL LOW (ref 30.0–36.0)
MCV: 104.5 fL — ABNORMAL HIGH (ref 80.0–100.0)
Platelets: 278 10*3/uL (ref 150–400)
RBC: 3.08 MIL/uL — ABNORMAL LOW (ref 4.22–5.81)
RDW: 14.9 % (ref 11.5–15.5)
WBC: 4.5 10*3/uL (ref 4.0–10.5)
nRBC: 0 % (ref 0.0–0.2)

## 2023-06-23 LAB — GLUCOSE, CAPILLARY
Glucose-Capillary: 99 mg/dL (ref 70–99)
Glucose-Capillary: 94 mg/dL (ref 70–99)

## 2023-06-23 LAB — CBG MONITORING, ED: Glucose-Capillary: 94 mg/dL (ref 70–99)

## 2023-06-23 LAB — PROCALCITONIN: Procalcitonin: <0.1 ng/mL

## 2023-06-23 LAB — BRAIN NATRIURETIC PEPTIDE: B Natriuretic Peptide: 564.3 pg/mL — ABNORMAL HIGH (ref 0.0–100.0)

## 2023-06-23 MED ORDER — IPRATROPIUM-ALBUTEROL 0.5-2.5 (3) MG/3ML IN SOLN
3.0000 mL | RESPIRATORY_TRACT | Status: DC | PRN
  Administered 2023-06-23: 3 mL via RESPIRATORY_TRACT
  Filled 2023-06-23: qty 3

## 2023-06-23 MED ORDER — SENNOSIDES-DOCUSATE SODIUM 8.6-50 MG PO TABS
1.0000 | ORAL_TABLET | Freq: Every evening | ORAL | Status: DC | PRN
Start: 2023-06-23 — End: 2023-06-29

## 2023-06-23 MED ORDER — SODIUM CHLORIDE 0.9 % IV SOLN
1.0000 g | Freq: Three times a day (TID) | INTRAVENOUS | Status: DC
  Administered 2023-06-23: 1 g via INTRAVENOUS
  Filled 2023-06-23 (×4): qty 20

## 2023-06-23 MED ORDER — ORAL CARE MOUTH RINSE
15.0000 mL | OROMUCOSAL | Status: DC
  Administered 2023-06-23 – 2023-06-29 (×21): 15 mL via OROMUCOSAL

## 2023-06-23 MED ORDER — DEXTROSE-SODIUM CHLORIDE 5-0.45 % IV SOLN: INTRAVENOUS | Status: DC

## 2023-06-23 MED ORDER — CHLORHEXIDINE GLUCONATE CLOTH 2 % EX PADS
6.0000 | MEDICATED_PAD | Freq: Every day | CUTANEOUS | Status: DC
  Administered 2023-06-23 – 2023-06-29 (×7): 6 via TOPICAL

## 2023-06-23 MED ORDER — SODIUM CHLORIDE 0.9 % IV SOLN
1.0000 g | Freq: Three times a day (TID) | INTRAVENOUS | Status: DC
  Administered 2023-06-24 – 2023-06-26 (×7): 1 g via INTRAVENOUS
  Filled 2023-06-23 (×7): qty 20

## 2023-06-23 MED ORDER — ORAL CARE MOUTH RINSE: 15.0000 mL | OROMUCOSAL | Status: DC | PRN

## 2023-06-23 MED ORDER — IPRATROPIUM-ALBUTEROL 0.5-2.5 (3) MG/3ML IN SOLN
3.0000 mL | Freq: Two times a day (BID) | RESPIRATORY_TRACT | Status: DC
  Administered 2023-06-23 – 2023-06-25 (×5): 3 mL via RESPIRATORY_TRACT
  Filled 2023-06-23 (×7): qty 3

## 2023-06-23 MED ORDER — HYDRALAZINE HCL 20 MG/ML IJ SOLN: 10.0000 mg | INTRAMUSCULAR | Status: DC | PRN

## 2023-06-23 MED ORDER — METOPROLOL TARTRATE 5 MG/5ML IV SOLN: 5.0000 mg | INTRAVENOUS | Status: DC | PRN

## 2023-06-23 MED ORDER — GUAIFENESIN 100 MG/5ML PO LIQD: 5.0000 mL | ORAL | Status: DC | PRN

## 2023-06-23 MED ORDER — FUROSEMIDE 10 MG/ML IJ SOLN
40.0000 mg | Freq: Once | INTRAMUSCULAR | Status: AC
  Administered 2023-06-23: 40 mg via INTRAVENOUS
  Filled 2023-06-23: qty 4

## 2023-06-23 NOTE — Progress Notes (Signed)
 Pt is not responding. CO2 on VBG was elevated. Pt is a DNR. With lack of other options for lowering CO2. The decision was made by Md RT and family to try bipap for 2 hours to see if pt has any improvements. VBG will be redrawn at 1400. Pt is tolerating bipap well at this time

## 2023-06-23 NOTE — Plan of Care (Signed)
   Problem: Clinical Measurements: Goal: Ability to maintain clinical measurements within normal limits will improve Outcome: Progressing Goal: Respiratory complications will improve Outcome: Progressing Goal: Cardiovascular complication will be avoided Outcome: Progressing

## 2023-06-23 NOTE — Progress Notes (Signed)
 PT Cancellation Note  Patient Details Name: Alexander [name] MRN: [id number] DOB: 1932-07-26   Cancelled Treatment:    Reason Eval/Treat Not Completed: Other (comment) Patient admitted from SNF, will defer evaluation until tomorrow, in hopes patient status is improved for therapy.  Blanchard Kelch PT Acute Rehabilitation Services Office [phone] Weekend pager-[phone]   Rada Hay 06/23/2023, 1:42 PM

## 2023-06-23 NOTE — Progress Notes (Signed)
 Pharmacy Antibiotic Note  Alexander [name] is a 89 y.o. male admitted on 06/22/2023 with  PNA, UTI .  Pharmacy has been consulted for Merrem/Vanco dosing.  ID: PNA - chronic indwelling cath - recently hospitalized from 05/25/23 to 06/02/23 for sepsis from UTI w/ enterococcus bacteremia. TTE was negative for vegetation. Treated with ampicillin inpatient and discharged with amoxicillin for a 2 week course (EOT 06/10/23).  - Afebrile, WBC 4.5  2/28 vanc>> 2/28 zosyn>>3/1 3/1 Merrem>>  2/1 UCx: PSA and Enterococcus avium + PSA 2/1: BC: Enterococcus faecalis + Enterococcus avium  Plan: - vancomycin 1500 mg IV given in the ED at 4pm, then 1000 mg IV q24h for est AUC 430, TBW, scr 0.86 - D/C Zosyn - Merrem 1g IV q8hr - daily scr    Weight: 64.9 kg (143 lb)  Temp (24hrs), Avg:97.8 F (36.6 C), Min:97.4 F (36.3 C), Max:98.6 F (37 C)  Recent Labs  Lab 06/22/23 1407 06/23/23 0528  WBC 6.1 4.5  CREATININE 0.86 0.79    Estimated Creatinine Clearance: 58.6 mL/min (by C-G formula based on SCr of 0.79 mg/dL).    No Known Allergies  Jerardo Costabile S. Merilynn Finland, PharmD, BCPS Clinical Staff Pharmacist Misty Stanley Stillinger 06/23/2023 11:18 AM

## 2023-06-23 NOTE — Progress Notes (Signed)
 SLP Cancellation Note  Patient Details Name: RENNER SEBALD MRN: [id number] DOB: 11-Sep-1932   Cancelled treatment:       Reason Eval/Treat Not Completed: Patient not medically ready. Pt on BiPAP. Will f/u on subsequent date for swallow eval.    Avie Echevaria, MA, CCC-SLP Acute Rehabilitation Services Office Number: [phone]  Paulette Blanch 06/23/2023, 2:52 PM

## 2023-06-23 NOTE — Progress Notes (Signed)
 Checked on Pt.He is beginning to wake up some now.
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
## 2023-06-24 DIAGNOSIS — J449 Chronic obstructive pulmonary disease, unspecified: Secondary | ICD-10-CM

## 2023-06-24 DIAGNOSIS — F03B18 Unspecified dementia, moderate, with other behavioral disturbance: Secondary | ICD-10-CM

## 2023-06-24 DIAGNOSIS — G9341 Metabolic encephalopathy: Secondary | ICD-10-CM | POA: Diagnosis not present

## 2023-06-24 DIAGNOSIS — J189 Pneumonia, unspecified organism: Secondary | ICD-10-CM

## 2023-06-24 DIAGNOSIS — Z515 Encounter for palliative care: Secondary | ICD-10-CM | POA: Diagnosis not present

## 2023-06-24 DIAGNOSIS — J9601 Acute respiratory failure with hypoxia: Secondary | ICD-10-CM

## 2023-06-24 DIAGNOSIS — Z7189 Other specified counseling: Secondary | ICD-10-CM

## 2023-06-24 LAB — COMPREHENSIVE METABOLIC PANEL
ALT: 208 U/L — ABNORMAL HIGH (ref 0–44)
AST: 97 U/L — ABNORMAL HIGH (ref 15–41)
Albumin: 2.9 g/dL — ABNORMAL LOW (ref 3.5–5.0)
Alkaline Phosphatase: 103 U/L (ref 38–126)
Anion gap: 9 (ref 5–15)
BUN: 17 mg/dL (ref 8–23)
CO2: 31 mmol/L (ref 22–32)
Calcium: 8.8 mg/dL — ABNORMAL LOW (ref 8.9–10.3)
Chloride: 109 mmol/L (ref 98–111)
Creatinine, Ser: 0.76 mg/dL (ref 0.61–1.24)
GFR, Estimated: >60 mL/min (ref >60)
Glucose, Bld: 89 mg/dL (ref 70–99)
Potassium: 4.4 mmol/L (ref 3.5–5.1)
Sodium: 149 mmol/L — ABNORMAL HIGH (ref 135–145)
Total Bilirubin: 0.7 mg/dL (ref 0.0–1.2)
Total Protein: 5.7 g/dL — ABNORMAL LOW (ref 6.5–8.1)

## 2023-06-24 LAB — CBC
HCT: 37.9 % — ABNORMAL LOW (ref 39.0–52.0)
Hemoglobin: 10.7 g/dL — ABNORMAL LOW (ref 13.0–17.0)
MCH: 30.6 pg (ref 26.0–34.0)
MCHC: 28.2 g/dL — ABNORMAL LOW (ref 30.0–36.0)
MCV: 108.3 fL — ABNORMAL HIGH (ref 80.0–100.0)
Platelets: 243 10*3/uL (ref 150–400)
RBC: 3.5 MIL/uL — ABNORMAL LOW (ref 4.22–5.81)
RDW: 14.7 % (ref 11.5–15.5)
WBC: 7.4 10*3/uL (ref 4.0–10.5)
nRBC: 0 % (ref 0.0–0.2)

## 2023-06-24 LAB — GLUCOSE, CAPILLARY
Glucose-Capillary: 78 mg/dL (ref 70–99)
Glucose-Capillary: 84 mg/dL (ref 70–99)
Glucose-Capillary: 97 mg/dL (ref 70–99)

## 2023-06-24 LAB — URINE CULTURE: Culture: NO GROWTH

## 2023-06-24 LAB — PHOSPHORUS: Phosphorus: 3.5 mg/dL (ref 2.5–4.6)

## 2023-06-24 LAB — MAGNESIUM: Magnesium: 2.2 mg/dL (ref 1.7–2.4)

## 2023-06-24 MED ORDER — DEXTROSE-SODIUM CHLORIDE 10-0.45 % IV SOLN
INTRAVENOUS | Status: DC
  Filled 2023-06-24: qty 1000

## 2023-06-24 MED ORDER — GLUCAGON HCL RDNA (DIAGNOSTIC) 1 MG IJ SOLR
1.0000 mg | INTRAMUSCULAR | Status: DC | PRN
Start: 2023-06-24 — End: 2023-06-29

## 2023-06-24 NOTE — Progress Notes (Signed)
 OT Cancellation Note  Patient Details Name: Alexander [name] MRN: [id number] DOB: 1932/07/11   Cancelled Treatment:    Reason Eval/Treat Not Completed: Fatigue/lethargy limiting ability to participate Rosalio Loud, MS Acute Rehabilitation Department Office# [phone]  06/24/2023, 8:28 AM

## 2023-06-24 NOTE — Evaluation (Signed)
 SLP Cancellation Note  Patient Details Name: Alexander [name] MRN: [id number] DOB: [birth year]   Cancelled treatment:       Reason Eval/Treat Not Completed: Other (comment);Fatigue/lethargy limiting ability to participate (RN reports pt lethargic, will reach out to RN later today to determine if pt ready for evaluation. Thanks for this consult.)  Rolena Infante, MS [facility] SLP Acute Rehab Services Office [phone]  Chales Abrahams 06/24/2023, 10:20 AM

## 2023-06-24 NOTE — Progress Notes (Signed)
 PT Cancellation Note  Patient Details Name: Alexander [name] MRN: [id number] DOB: [birth year]   Cancelled Treatment:     PT order received but eval deferred 2* Fatigue/lethargy limiting pt ability to participate. Will follow.   Zondra Lawlor 06/24/2023, 10:57 AM
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
## 2023-06-24 NOTE — Progress Notes (Signed)
 Patient kept removing himself from bipap. Patient placed on salter cannula

## 2023-06-24 NOTE — Plan of Care (Signed)
  Problem: Education: Goal: Knowledge of General Education information will improve Description: Including pain rating scale, medication(s)/side effects and non-pharmacologic comfort measures Outcome: Progressing   Problem: Health Behavior/Discharge Planning: Goal: Ability to manage health-related needs will improve Outcome: Progressing   Problem: Clinical Measurements: Goal: Ability to maintain clinical measurements within normal limits will improve Outcome: Progressing Goal: Will remain free from infection Outcome: Progressing Goal: Diagnostic test results will improve Outcome: Progressing Goal: Respiratory complications will improve Outcome: Progressing Goal: Cardiovascular complication will be avoided Outcome: Progressing   Problem: Activity: Goal: Risk for activity intolerance will decrease Outcome: Progressing   Problem: Nutrition: Goal: Adequate nutrition will be maintained Outcome: Progressing   Problem: Coping: Goal: Level of anxiety will decrease Outcome: Progressing   Problem: Elimination: Goal: Will not experience complications related to bowel motility Outcome: Progressing Goal: Will not experience complications related to urinary retention Outcome: Progressing   Problem: Pain Managment: Goal: General experience of comfort will improve and/or be controlled Outcome: Progressing   Problem: Safety: Goal: Ability to remain free from injury will improve Outcome: Progressing   Problem: Skin Integrity: Goal: Risk for impaired skin integrity will decrease Outcome: Progressing   Problem: Education: Goal: Knowledge of disease or condition will improve Outcome: Progressing Goal: Knowledge of the prescribed therapeutic regimen will improve Outcome: Progressing   Problem: Activity: Goal: Ability to tolerate increased activity will improve Outcome: Progressing Goal: Will verbalize the importance of balancing activity with adequate rest periods Outcome:  Progressing   Problem: Respiratory: Goal: Ability to maintain a clear airway will improve Outcome: Progressing Goal: Levels of oxygenation will improve Outcome: Progressing Goal: Ability to maintain adequate ventilation will improve Outcome: Progressing   Problem: Activity: Goal: Ability to tolerate increased activity will improve Outcome: Progressing   Problem: Clinical Measurements: Goal: Ability to maintain a body temperature in the normal range will improve Outcome: Progressing   Problem: Respiratory: Goal: Ability to maintain adequate ventilation will improve Outcome: Progressing Goal: Ability to maintain a clear airway will improve Outcome: Progressing   Cindy S. Clelia Croft BSN, RN, [employer], CCRN 06/24/2023 3:54 AM

## 2023-06-24 NOTE — Consult Note (Signed)
 Consultation Note Date: 06/24/2023   Patient Name: Alexander [name]  DOB: 1932/06/06  MRN: [id number]  Age / Sex: 89 y.o., male   PCP: [name], MD Referring Physician: Miguel Rota, MD  Reason for Consultation: Establishing goals of care     Chief Complaint/History of Present Illness:   Patient is an 89 year old male with a past medical history of hypertension, hyperlipidemia, CAD, COPD, dementia, and BPH with chronic indwelling Foley catheter who was admitted from on 06/23/2023 for management of altered mental status mumbling.  Since admission, patient has received management for acute hypoxic respiratory failure secondary to aspiration pneumonia, acute hypoxic respiratory failure with hypercarbia and CO2 narcosis, and concern for UTI now on antibiotics.  Palliative medicine team consulted to assist with complex medical decision making. Of note, patient seen by palliative medicine team previously.  Patient previously had support via [facility] hospice in the home setting.  This was discontinued during last hospitalization when family made decision for patient to go to rehab for SNF.  Extensive review of EMR prior to presenting to bedside.  Personally reviewed chest x-ray obtained on 06/23/2023 noting pleural effusions left greater than right.  PT/OT/SLP consulted patient has not been able to participate in this evaluation due to fatigue and lethargy. Patient has medical power of attorney documentation on file.  Patient's daughter, Alexander [name], named is HCPOA with primary alternate noted to be Alexander [name].  Discussed care with bedside RN for updates.  Presented to bedside to meet with patient.  Patient laying calmly in bed.  Patient will easily awaken.  Inquiring how patient is feeling today he notes that he "is feeling better".  He is oriented to self though not time and place.  No family was present at bedside during evaluation.  Patient did not voice any symptom concerns when  inquiring.  Discussed care with SLP after visit.  Hoping they will be able to evaluate patient now that he is more awake to determine risk of aspiration this would likely be helpful information to have when discussing pathways for medical care moving forward with patient's family.  Discussed care with hospitalist after visit as well.  Hospitalist planning to reach out to daughter to discuss care soon so noted palliative medicine team would allow this conversation to occur and continue to follow along to engage in conversations as appropriate moving forward.  Primary Diagnoses  Present on Admission:  Acute respiratory failure with hypoxia (HCC)  Normocytic anemia  Moderate dementia with behavioral disturbance  Essential hypertension  COPD, moderate (HCC)  GERD (gastroesophageal reflux disease)  Acute metabolic encephalopathy  Acute respiratory distress   Past Medical History:  Diagnosis Date   Alzheimers disease (HCC)    mild   Anxiety    Asthma    BPH (benign prostatic hyperplasia)    CAD (coronary artery disease)    Constipation    COPD (chronic obstructive pulmonary disease) (HCC)    chronic dyspnea   Dementia (HCC)    wife answered he has Dementia   Depression    GERD (gastroesophageal reflux disease)    Hearing loss    Hemorrhoids    HYPERLIPIDEMIA    Hypertension    Hypertensive heart disease    LVH (left ventricular hypertrophy)    a. 2014 Echo: EF 65-70%, no rwma, Gr1 DD, mild MR.   Non-obstructive CAD (coronary artery disease)    a. 02/2009 Cath: essentially nl cors; b. 07/2014 MV: mild diaph atten, no ischemia-->low risk.   Social  History   Socioeconomic History   Marital status: Married    Spouse name: Not on file   Number of children: 8   Years of education: 8   Highest education level: Not on file  Occupational History   Occupation: RETIRED    Employer: RETIRED    Comment: MACHINE OPERATOR  Tobacco Use   Smoking status: Former    Current packs/day:  0.00    Average packs/day: 0.3 packs/day for 48.0 years (12.0 ttl pk-yrs)    Types: Cigarettes    Start date: 04/24/1952    Quit date: 04/24/2000    Years since quitting: 23.1   Smokeless tobacco: Never  Vaping Use   Vaping status: Never Used  Substance and Sexual Activity   Alcohol use: Not Currently   Drug use: No   Sexual activity: Yes  Other Topics Concern   Not on file  Social History Narrative   EXERCISES INTERMITTENTLY AT THE GYM      5 GRANDCHILDREN      2 GREAT-CHILDREN            WEARS GLASSES      Lives in one story home with wife.      Right handed   Social Drivers of Health   Financial Resource Strain: Low Risk  (09/07/2017)   Overall Financial Resource Strain (CARDIA)    Difficulty of Paying Living Expenses: Not hard at all  Food Insecurity: No Food Insecurity (05/26/2023)   Hunger Vital Sign    Worried About Running Out of Food in the Last Year: Never true    Ran Out of Food in the Last Year: Never true  Transportation Needs: No Transportation Needs (05/26/2023)   PRAPARE - Administrator, Civil Service (Medical): No    Lack of Transportation (Non-Medical): No  Physical Activity: Sufficiently Active (09/07/2017)   Exercise Vital Sign    Days of Exercise per Week: 3 days    Minutes of Exercise per Session: 50 min  Stress: No Stress Concern Present (09/07/2017)   [employer] of Occupational Health - Occupational Stress Questionnaire    Feeling of Stress : Not at all  Social Connections: Moderately Isolated (05/26/2023)   Social Connection and Isolation Panel [NHANES]    Frequency of Communication with Friends and Family: More than three times a week    Frequency of Social Gatherings with Friends and Family: Once a week    Attends Religious Services: 1 to 4 times per year    Active Member of [employer] or Organizations: No    Attends Banker Meetings: Never    Marital Status: Widowed   Family History  Problem Relation Age of Onset    Pancreatic cancer Mother 66   Hypertension Mother    Aneurysm Father 21       brain   Stroke Father    Hypertension Father    Hyperlipidemia Father    Prostate cancer Brother        x 2 bro   Scheduled Meds:  Chlorhexidine Gluconate Cloth  6 each Topical Daily   enoxaparin (LOVENOX) injection  40 mg Subcutaneous Q24H   ipratropium-albuterol  3 mL Nebulization BID   mouth rinse  15 mL Mouth Rinse 4 times per day   sodium chloride flush  3 mL Intravenous Q12H   Continuous Infusions:  meropenem (MERREM) IV Stopped (06/24/23 0610)   vancomycin Stopped (06/23/23 1801)   PRN Meds:.acetaminophen **OR** acetaminophen, guaiFENesin, hydrALAZINE, ipratropium-albuterol, metoprolol tartrate, ondansetron **OR** ondansetron (  ZOFRAN) IV, mouth rinse, senna-docusate, sodium chloride flush No Known Allergies CBC:    Component Value Date/Time   WBC 7.4 06/24/2023 0316   HGB 10.7 (L) 06/24/2023 0316   HCT 37.9 (L) 06/24/2023 0316   PLT 243 06/24/2023 0316   MCV 108.3 (H) 06/24/2023 0316   MCV 97.4 (A) 12/02/2012 1714   NEUTROABS 19.5 (H) 03/19/2023 2011   LYMPHSABS 1.0 03/19/2023 2011   MONOABS 1.6 (H) 03/19/2023 2011   EOSABS 0.0 03/19/2023 2011   BASOSABS 0.1 03/19/2023 2011   Comprehensive Metabolic Panel:    Component Value Date/Time   NA 149 (H) 06/24/2023 0316   K 4.4 06/24/2023 0316   CL 109 06/24/2023 0316   CO2 31 06/24/2023 0316   BUN 17 06/24/2023 0316   CREATININE 0.76 06/24/2023 0316   CREATININE 0.99 07/29/2014 0814   GLUCOSE 89 06/24/2023 0316   CALCIUM 8.8 (L) 06/24/2023 0316   AST 97 (H) 06/24/2023 0316   ALT 208 (H) 06/24/2023 0316   ALKPHOS 103 06/24/2023 0316   BILITOT 0.7 06/24/2023 0316   PROT 5.7 (L) 06/24/2023 0316   ALBUMIN 2.9 (L) 06/24/2023 0316    Physical Exam: Vital Signs: BP 122/69   Pulse 64   Temp 97.8 F (36.6 C) (Oral)   Resp 16   Ht 5\' 8"  (1.727 m)   Wt 58.1 kg   SpO2 98%   BMI 19.48 kg/m  SpO2: SpO2: 98 % O2 Device: O2 Device:  High Flow Nasal Cannula O2 Flow Rate: O2 Flow Rate (L/min): 5 L/min Intake/output summary:  Intake/Output Summary (Last 24 hours) at 06/24/2023 8119 Last data filed at 06/24/2023 0600 Gross per 24 hour  Intake 718.56 ml  Output 2500 ml  Net -1781.44 ml   LBM: Last BM Date :  (pta) Baseline Weight: Weight: 64.9 kg Most recent weight: Weight: 58.1 kg  General: NAD, easily awakened, cachectic, frail Cardiovascular: RRR Respiratory: no increased work of breathing noted, not in respiratory distress Extremities: Muscle wasting in all extremities Neuro: Only oriented to self  Palliative Performance Scale: 10%               Additional Data Reviewed: Recent Labs    06/23/23 0528 06/23/23 1622 06/24/23 0316  WBC 4.5  --  7.4  HGB 9.5* 11.6* 10.7*  PLT 278  --  243  NA 149* 150* 149*  BUN 19  --  17  CREATININE 0.79  --  0.76    Imaging: DG Chest Port 1 View CLINICAL DATA:  Dyspnea  EXAM: PORTABLE CHEST 1 VIEW  COMPARISON:  06/22/2023, chest CT 04/11/2020, chest x-ray 03/19/2023  FINDINGS: Metallic densities over the right upper chest and right base as before. Emphysema. Hyperlucent right base which may be due to hyper aeration, no discrete pleural line to suggest pneumothorax. Small moderate left effusion increased compared to prior. Suspicion of small right effusion. Airspace disease at left greater than right lung base. Stable cardiomediastinal silhouette with aortic atherosclerosis  IMPRESSION: 1. Left greater than right pleural effusions, increased on the left compared to prior with worsened airspace disease at left base. 2. Emphysema  Electronically Signed   By: Jasmine Pang M.D.   On: 06/23/2023 16:05    I personally reviewed recent imaging.   Palliative Care Assessment and Plan Summary of Established Goals of Care and Medical Treatment Preferences   Patient is an 89 year old male with a past medical history of hypertension, hyperlipidemia, CAD, COPD,  dementia, and BPH with chronic indwelling Foley  catheter who was admitted from on 06/23/2023 for management of altered mental status mumbling.  Since admission, patient has received management for acute hypoxic respiratory failure secondary to aspiration pneumonia, acute hypoxic respiratory failure with hypercarbia and CO2 narcosis, and concern for UTI now on antibiotics.  Palliative medicine team consulted to assist with complex medical decision making. Of note, patient seen by palliative medicine team previously.  # Complex medical decision making/goals of care  -Patient unable to participate in complex medical decision-making due to medical status.  No family was present at time of visit to patient.  Hospitalist planning to reach out to patient's daughter today to discuss care.  Noted palliative medicine team will continue to follow along and engage in conversations as appropriate.  -Patient has medical power of attorney documentation on file.  Patient's daughter, Alexander [name], named is HCPOA with primary alternate noted to be Alexander [name].   -  Code Status: Limited: Do not attempt resuscitation (DNR) -DNR-LIMITED -Do Not Intubate/DNI    # Psycho-social/Spiritual Support:  - Support System: Daughters-  Sonja and [place], sons-Darryl and [place] as per EMR review   # Discharge Planning:  To Be Determined  Thank you for allowing the palliative care team to participate in the care [name].  [name], DO Palliative Care Provider PMT # [phone]  If patient remains symptomatic despite maximum doses, please call PMT at [phone] between 0700 and 1900. Outside of these hours, please call attending, as PMT does not have night coverage.  Personally spent 60 minutes in patient care including extensive chart review (labs, imaging, progress/consult notes, vital signs), medically appropraite exam, discussed with treatment team, education to patient, family, and staff, documenting  clinical information, medication review and management, coordination of care, and available advanced directive documents.

## 2023-06-24 NOTE — TOC Initial Note (Signed)
 Transition of Care [facility]) - Initial/Assessment Note    Patient Details  Name: Alexander [name] MRN: [id number] Date of Birth: 07/13/1932  Transition of Care [facility]) CM/SW Contact:    Diona Browner, LCSW Phone Number: 06/24/2023, 10:29 AM  Clinical Narrative:                 TOC waiting for PT evaluation recommendation.  Expected Discharge Plan: Skilled Nursing Facility     Patient Goals and CMS Choice Patient states their goals for this hospitalization and ongoing recovery are:: return to baseline          Expected Discharge Plan and Services       Living arrangements for the past 2 months: Skilled Nursing Facility                   DME Agency: NA       HH Arranged: NA          Prior Living Arrangements/Services Living arrangements for the past 2 months: Skilled Nursing Facility Lives with:: Facility Resident Patient language and need for interpreter reviewed:: Yes Do you feel safe going back to the place where you live?: Yes      Need for Family Participation in Patient Care: Yes (Comment) Care giver support system in place?: Yes (comment)   Criminal Activity/Legal Involvement Pertinent to Current Situation/Hospitalization: No - Comment as needed  Activities of Daily Living   ADL Screening (condition at time of admission) Independently performs ADLs?: No Does the patient have a NEW difficulty with bathing/dressing/toileting/self-feeding that is expected to last >3 days?: Yes (Initiates electronic notice to provider for possible OT consult) Does the patient have a NEW difficulty with getting in/out of bed, walking, or climbing stairs that is expected to last >3 days?: Yes (Initiates electronic notice to provider for possible PT consult) Does the patient have a NEW difficulty with communication that is expected to last >3 days?: No Is the patient deaf or have difficulty hearing?: Yes Does the patient have difficulty seeing, even when wearing  glasses/contacts?: Yes Does the patient have difficulty concentrating, remembering, or making decisions?: Yes  Permission Sought/Granted                  Emotional Assessment       Orientation: : Oriented to Self Alcohol / Substance Use: Not Applicable Psych Involvement: No (comment)  Admission diagnosis:  Acute respiratory distress [R06.03] Acute respiratory failure with hypoxia (HCC) [J96.01] Patient Active Problem List   Diagnosis Date Noted   Acute respiratory distress 06/23/2023   Acute metabolic encephalopathy 06/22/2023   Bacteremia due to Enterococcus 05/29/2023   Complication of Foley catheter (HCC) 05/26/2023   Hyperkalemia 05/26/2023   Complicated UTI (urinary tract infection) 03/19/2023   Acute cystitis without hematuria 02/19/2023   Pressure injury of skin 02/19/2023   Fever 02/08/2023   Acute UTI (urinary tract infection) 02/08/2023   Malnutrition of moderate degree 12/05/2022   Bacteremia due to Klebsiella pneumoniae 12/01/2022   Sepsis (HCC) 11/29/2022   COPD exacerbation (HCC) 11/29/2020   Acute hypoxemic respiratory failure (HCC) 11/29/2020   Moderate dementia with behavioral disturbance 11/29/2020   COVID-19 04/11/2020   Syncope and collapse 04/11/2020   Right shoulder pain 02/19/2019   Hematochezia 01/03/2019   Bilateral hearing loss 10/06/2018   Dementia without behavioral disturbance (HCC) 09/02/2018   Acute respiratory failure with hypoxia (HCC) 09/02/2018   Rash 05/30/2018   Blood in ear canal, left 05/21/2018   Dermatitis 04/12/2018  Preventative health care 11/27/2017   Weight loss 08/27/2017   Abdominal discomfort, generalized 08/27/2017   Normocytic anemia 08/27/2017   Hyponatremia 08/27/2017   Chronic obstructive pulmonary disease (HCC) 06/05/2017   Chronic pansinusitis 06/05/2017   BPH with obstruction/lower urinary tract symptoms 01/30/2017   Gynecomastia, male 09/06/2016   Chest pain 05/21/2016   Hyperglycemia 05/09/2016    Depression 05/09/2016   Rhinitis, chronic 05/09/2016   Upper airway cough syndrome 03/18/2016   Hypertensive heart disease    Dark stools 11/14/2015   Screening for prostate cancer 05/06/2015   Thrush 05/06/2015   DOE (dyspnea on exertion) 07/31/2014   Constipation 01/27/2014   Glaucoma    Erectile dysfunction    Left ventricular hypertrophy 12/01/2012   GERD (gastroesophageal reflux disease) 10/28/2012   COPD, moderate (HCC) 10/22/2012   HLD (hyperlipidemia) 10/11/2009   Essential hypertension 10/11/2009   Cough, persistent 10/11/2009   PCP:  [name], MD Pharmacy:   [id number] - [place], [place] - [address] [place] [address] Phone: [phone] Fax: [phone]     Social Drivers of Health (SDOH) Social History: SDOH Screenings   Food Insecurity: No Food Insecurity (05/26/2023)  Housing: Low Risk  (05/26/2023)  Transportation Needs: No Transportation Needs (05/26/2023)  Utilities: Not At Risk (05/26/2023)  Depression (PHQ2-9): Low Risk  (12/16/2020)  Financial Resource Strain: Low Risk  (09/07/2017)  Physical Activity: Sufficiently Active (09/07/2017)  Social Connections: Moderately Isolated (05/26/2023)  Stress: No Stress Concern Present (09/07/2017)  Tobacco Use: Medium Risk (05/25/2023)   SDOH Interventions:     Readmission Risk Interventions    06/24/2023   10:27 AM 05/29/2023    3:53 PM 03/20/2023    2:33 PM  Readmission Risk Prevention Plan  Transportation Screening Complete Complete Complete  Medication Review (RN Care Manager) Complete Referral to Pharmacy Referral to Pharmacy  PCP or Specialist appointment within 3-5 days of discharge Complete Complete Complete  HRI or Home Care Consult Complete Complete Complete  SW Recovery Care/Counseling Consult Complete Complete Complete  Palliative Care Screening Not Applicable Not Applicable Not Applicable  Skilled Nursing Facility Complete Not Applicable Not Applicable
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
## 2023-06-25 ENCOUNTER — Encounter (HOSPITAL_COMMUNITY): Payer: Self-pay | Admitting: Internal Medicine

## 2023-06-25 DIAGNOSIS — J449 Chronic obstructive pulmonary disease, unspecified: Secondary | ICD-10-CM | POA: Diagnosis not present

## 2023-06-25 DIAGNOSIS — G9341 Metabolic encephalopathy: Secondary | ICD-10-CM | POA: Diagnosis not present

## 2023-06-25 DIAGNOSIS — Z515 Encounter for palliative care: Secondary | ICD-10-CM | POA: Diagnosis not present

## 2023-06-25 DIAGNOSIS — Z7189 Other specified counseling: Secondary | ICD-10-CM | POA: Diagnosis not present

## 2023-06-25 DIAGNOSIS — J9601 Acute respiratory failure with hypoxia: Secondary | ICD-10-CM | POA: Diagnosis not present

## 2023-06-25 LAB — COMPREHENSIVE METABOLIC PANEL
ALT: 138 U/L — ABNORMAL HIGH (ref 0–44)
AST: 42 U/L — ABNORMAL HIGH (ref 15–41)
Albumin: 2.7 g/dL — ABNORMAL LOW (ref 3.5–5.0)
Alkaline Phosphatase: 90 U/L (ref 38–126)
Anion gap: 8 (ref 5–15)
BUN: 13 mg/dL (ref 8–23)
CO2: 32 mmol/L (ref 22–32)
Calcium: 8.8 mg/dL — ABNORMAL LOW (ref 8.9–10.3)
Chloride: 108 mmol/L (ref 98–111)
Creatinine, Ser: 0.6 mg/dL — ABNORMAL LOW (ref 0.61–1.24)
GFR, Estimated: >60 mL/min (ref >60)
Glucose, Bld: 88 mg/dL (ref 70–99)
Potassium: 4 mmol/L (ref 3.5–5.1)
Sodium: 148 mmol/L — ABNORMAL HIGH (ref 135–145)
Total Bilirubin: 0.8 mg/dL (ref 0.0–1.2)
Total Protein: 5.4 g/dL — ABNORMAL LOW (ref 6.5–8.1)

## 2023-06-25 LAB — GLUCOSE, CAPILLARY
Glucose-Capillary: 109 mg/dL — ABNORMAL HIGH (ref 70–99)
Glucose-Capillary: 80 mg/dL (ref 70–99)
Glucose-Capillary: 80 mg/dL (ref 70–99)
Glucose-Capillary: 82 mg/dL (ref 70–99)
Glucose-Capillary: 89 mg/dL (ref 70–99)

## 2023-06-25 LAB — CBC
HCT: 38 % — ABNORMAL LOW (ref 39.0–52.0)
Hemoglobin: 10.8 g/dL — ABNORMAL LOW (ref 13.0–17.0)
MCH: 30.5 pg (ref 26.0–34.0)
MCHC: 28.4 g/dL — ABNORMAL LOW (ref 30.0–36.0)
MCV: 107.3 fL — ABNORMAL HIGH (ref 80.0–100.0)
Platelets: 244 10*3/uL (ref 150–400)
RBC: 3.54 MIL/uL — ABNORMAL LOW (ref 4.22–5.81)
RDW: 14.6 % (ref 11.5–15.5)
WBC: 5.6 10*3/uL (ref 4.0–10.5)
nRBC: 0 % (ref 0.0–0.2)

## 2023-06-25 LAB — MAGNESIUM: Magnesium: 2 mg/dL (ref 1.7–2.4)

## 2023-06-25 LAB — TSH: TSH: 4.278 u[IU]/mL (ref 0.350–4.500)

## 2023-06-25 MED ORDER — DEXTROSE 10 % IV SOLN: INTRAVENOUS | Status: AC

## 2023-06-25 NOTE — Plan of Care (Signed)

## 2023-06-25 NOTE — Progress Notes (Signed)
 PT Cancellation Note  Patient Details Name: Alexander [name] MRN: [id number] DOB: December 28, 1932   Cancelled Treatment:    Reason Eval/Treat Not Completed: PT screened, no needs identified, will sign off. Patient is transitioning to Hospice services.  Blanchard Kelch PT Acute Rehabilitation Services Office [phone]     Rada Hay 06/25/2023, 12:05 PM

## 2023-06-25 NOTE — Progress Notes (Signed)
 [facility] Liaison Note  Hospital Liaisons notified that family requests further discussion about hospice services.   Visited patient with visitor at bedside. Nira [name], daughter, by phone. She has a good understanding of what hospice is. He has been under hospice care with [place] in the past. She didn't feel that she had any further questions at this time.   I did discuss [name] overall declining condition and likely repeated infections and desire to avoid repeated hospitalizations.   We will reschedule his appt with our nurse to "open" to services as soon as he is discharged. Please reach out to me with any further questions or concerns.   Glenna Fellows, BSN, RN, [facility] [phone]

## 2023-06-25 NOTE — TOC Progression Note (Signed)
 Transition of Care [facility]) - Progression Note    Patient Details  Name: Alexander [name] MRN: [id number] Date of Birth: [birth year]  Transition of Care [facility]) CM/SW Contact  Winda Summerall, Olegario Messier, RN Phone Number: 06/25/2023, 1:02 PM  Clinical Narrative:  Noted for palliative Care meeting-possible hospice. Continue to monitor.    Expected Discharge Plan:  (TBD) Barriers to Discharge: Continued Medical Work up  Expected Discharge Plan and Services       Living arrangements for the past 2 months: Skilled Nursing Facility                   DME Agency: NA       HH Arranged: NA           Social Determinants of Health (SDOH) Interventions SDOH Screenings   Food Insecurity: No Food Insecurity (05/26/2023)  Housing: Low Risk  (05/26/2023)  Transportation Needs: No Transportation Needs (05/26/2023)  Utilities: Not At Risk (05/26/2023)  Depression (PHQ2-9): Low Risk  (12/16/2020)  Financial Resource Strain: Low Risk  (09/07/2017)  Physical Activity: Sufficiently Active (09/07/2017)  Social Connections: Moderately Isolated (05/26/2023)  Stress: No Stress Concern Present (09/07/2017)  Tobacco Use: Medium Risk (06/25/2023)    Readmission Risk Interventions    06/24/2023   10:27 AM 05/29/2023    3:53 PM 03/20/2023    2:33 PM  Readmission Risk Prevention Plan  Transportation Screening Complete Complete Complete  Medication Review (RN Care Manager) Complete Referral to Pharmacy Referral to Pharmacy  PCP or Specialist appointment within 3-5 days of discharge Complete Complete Complete  HRI or Home Care Consult Complete Complete Complete  SW Recovery Care/Counseling Consult Complete Complete Complete  Palliative Care Screening Not Applicable Not Applicable Not Applicable  Skilled Nursing Facility Complete Not Applicable Not Applicable

## 2023-06-25 NOTE — Progress Notes (Signed)
 PROGRESS NOTE    Alexander [name]  ZOX:[id number] DOB: [birth year] DOA: 06/22/2023 PCP: [name], MD    Brief Narrative:  89 year old with history of HTN, HLD, nonobstructive CAD, COPD, BPH with chronic indwelling Foley catheter, depression, severe dementia presented from SNF for altered mental status and mumbling.  He has not been eating well or drinking for the past few days.  Recently admitted  from 1/31 - 2/8 UTI tx and eventually discharged to SNF on p.o. antibiotics.  Upon admission noted to be encephalopathic from aspiration pneumonia and urinary tract infection.  He also had hypoxic and hypercarbic respiratory failure requiring BiPAP.  Patient was confirmed DNR/DNI.  Foley catheter was exchanged as well.  Started on broad-spectrum antibiotics.   Assessment & Plan:  Principal Problem:   Acute metabolic encephalopathy Active Problems:   Normocytic anemia   Moderate dementia with behavioral disturbance   Essential hypertension   COPD, moderate (HCC)   GERD (gastroesophageal reflux disease)   Acute respiratory failure with hypoxia (HCC)     Acute metabolic encephalopathy, slowly improving Acute hypoxic respiratory failure secondary to aspiration pneumonia - Initially patient required BiPAP due to hypercarbia but has not improved his mentation improved as well.  Now unable to tolerate BiPAP as he keeps pulling it off.  Overall respirations are stable therefore we will place him on nasal cannula with supplements. - Continue broad-spectrum antibiotics-vancomycin and meropenem - Ammonia normal, CT head negative  Acute hypoxic respiratory failure with hypercarbia CO2 narcosis - Patient is confirmed DNR/DNI.  CO2 narcosis is improved.   History of COPD   Will continue with oxygen nebulizers.   History of BPH with chronic indwelling Foley catheter UTI Foley catheter exchanged in the ER.  Currently on broad-spectrum antibiotics.    Normocytic anemia Recent hemoglobin  of 10.3.  Will continue to monitor.   Dementia with behavioral disturbance History of depression. Outpatient Risperdal and Ativan on hold   Mild hypernatremia Secondary to dehydration continue IV fluids  Transaminitis - Trend LFTs   Goals of care Confirmed DNR/DNI Palliative re-consulted.   Family aware of long-term prognosis.  Daughter reported to me on 3/2 that they are supposed to have hospice meeting them today as outpatient.  Will make this arrangement today  DVT prophylaxis: enoxaparin (LOVENOX) injection 40 mg Start: 06/22/23 2200  DNR/DNI Family Communication: Family update Status is: Inpatient Remains inpatient appropriate because: Cont hospital stay.   Subjective: Patient seen at bedside.  Overall still appears weak.  Very poor oral intake.  Remains afebrile overnight.  Still on 3-4 L nasal cannula Examination:  General exam: Not in acute distress.  On nonrebreather.  Cachectic frail, muscle wasting.  3-4 L nasal cannula Respiratory system: Shallow breaths otherwise clear Cardiovascular system: S1 & S2 heard, RRR. No JVD, murmurs, rubs, gallops or clicks. No pedal edema. Gastrointestinal system: Abdomen is nondistended, soft and nontender. No organomegaly or masses felt. Normal bowel sounds heard. Central nervous system: Gross moving all the extremities Extremities: No swelling in lower extremities bilaterally. Skin: No rashes, lesions or ulcers Psychiatry: No signs of agitation Catheter in place           Pressure Injury 02/11/23 Sacrum Mid Stage 2 -  Partial thickness loss of dermis presenting as a shallow open injury with a red, pink wound bed without slough. Stage 2 pressure injury on sacrum, present on admission (Active)  02/11/23   Location: Sacrum  Location Orientation: Mid  Staging: Stage 2 -  Partial thickness loss of  dermis presenting as a shallow open injury with a red, pink wound bed without slough.  Wound Description (Comments): Stage 2  pressure injury on sacrum, present on admission  Present on Admission: Yes     Diet Orders (From admission, onward)     Start     Ordered   06/23/23 1627  DIET DYS 2 Room service appropriate? Yes; Fluid consistency: Thin  Diet effective now       Question Answer Comment  Room service appropriate? Yes   Fluid consistency: Thin      06/23/23 1626            Objective: Vitals:   06/25/23 0600 06/25/23 0700 06/25/23 0800 06/25/23 0838  BP: 126/78 (!) 127/56 139/61   Pulse: (!) 58 (!) 59 67   Resp: 15 13 (!) 25   Temp:   97.7 F (36.5 C)   TempSrc:   Axillary   SpO2: 100% 100% 100% 98%  Weight:      Height:        Intake/Output Summary (Last 24 hours) at 06/25/2023 1043 Last data filed at 06/25/2023 1008 Gross per 24 hour  Intake 1198.69 ml  Output 700 ml  Net 498.69 ml   Filed Weights   06/23/23 1500 06/24/23 0500 06/25/23 0500  Weight: 59.8 kg 58.1 kg 59.6 kg    Scheduled Meds:  Chlorhexidine Gluconate Cloth  6 each Topical Daily   enoxaparin (LOVENOX) injection  40 mg Subcutaneous Q24H   ipratropium-albuterol  3 mL Nebulization BID   mouth rinse  15 mL Mouth Rinse 4 times per day   sodium chloride flush  3 mL Intravenous Q12H   Continuous Infusions:  dextrose 40 mL/hr at 06/25/23 1008   meropenem (MERREM) IV Stopped (06/25/23 0540)   vancomycin Stopped (06/24/23 1626)    Nutritional status     Body mass index is 19.98 kg/m.  Data Reviewed:   CBC: Recent Labs  Lab 06/22/23 1407 06/23/23 0528 06/23/23 1622 06/24/23 0316 06/25/23 0326  WBC 6.1 4.5  --  7.4 5.6  HGB 10.3* 9.5* 11.6* 10.7* 10.8*  HCT 34.2* 32.2* 34.0* 37.9* 38.0*  MCV 103.0* 104.5*  --  108.3* 107.3*  PLT 307 278  --  243 244   Basic Metabolic Panel: Recent Labs  Lab 06/22/23 1407 06/23/23 0528 06/23/23 1622 06/24/23 0316 06/25/23 0326  NA 148* 149* 150* 149* 148*  K 3.9 4.2 4.0 4.4 4.0  CL 107 111  --  109 108  CO2 32 34*  --  31 32  GLUCOSE 90 94  --  89 88  BUN  24* 19  --  17 13  CREATININE 0.86 0.79  --  0.76 0.60*  CALCIUM 8.7* 8.4*  --  8.8* 8.8*  MG  --  2.1  --  2.2 2.0  PHOS  --   --   --  3.5  --    GFR: Estimated Creatinine Clearance: 53.8 mL/min (A) (by C-G formula based on SCr of 0.6 mg/dL (L)). Liver Function Tests: Recent Labs  Lab 06/22/23 1407 06/24/23 0316 06/25/23 0326  AST 127* 97* 42*  ALT 182* 208* 138*  ALKPHOS 133* 103 90  BILITOT 0.6 0.7 0.8  PROT 6.0* 5.7* 5.4*  ALBUMIN 3.0* 2.9* 2.7*   No results for input(s): "LIPASE", "AMYLASE" in the last 168 hours. Recent Labs  Lab 06/22/23 1407  AMMONIA <10   Coagulation Profile: No results for input(s): "INR", "PROTIME" in the last 168 hours. Cardiac Enzymes:  No results for input(s): "CKTOTAL", "CKMB", "CKMBINDEX", "TROPONINI" in the last 168 hours. BNP (last 3 results) No results for input(s): "PROBNP" in the last 8760 hours. HbA1C: No results for input(s): "HGBA1C" in the last 72 hours. CBG: Recent Labs  Lab 06/24/23 0602 06/24/23 1224 06/24/23 1929 06/25/23 0106 06/25/23 0601  GLUCAP 78 84 97 82 80   Lipid Profile: No results for input(s): "CHOL", "HDL", "LDLCALC", "TRIG", "CHOLHDL", "LDLDIRECT" in the last 72 hours. Thyroid Function Tests: Recent Labs    06/25/23 0835  TSH 4.278   Anemia Panel: No results for input(s): "VITAMINB12", "FOLATE", "FERRITIN", "TIBC", "IRON", "RETICCTPCT" in the last 72 hours. Sepsis Labs: Recent Labs  Lab 06/23/23 0536  PROCALCITON <0.10    Recent Results (from the past 240 hours)  Culture, blood (Routine X 2) w Reflex to ID Panel     Status: None (Preliminary result)   Collection Time: 06/22/23  2:07 PM   Specimen: BLOOD LEFT ARM  Result Value Ref Range Status   Specimen Description   Final    BLOOD LEFT ARM Performed at [facility], [address], [place], [place] [address]    Special Requests   Final    BOTTLES DRAWN AEROBIC AND ANAEROBIC Blood Culture adequate volume Performed at [facility], [address] [place], [place] [address]    Culture   Final    NO GROWTH 3 DAYS Performed at [facility], [address] [place], [place] [address]    Report Status PENDING  Incomplete  Resp panel by RT-PCR (RSV, Flu A&B, Covid) Anterior Nasal Swab     Status: None   Collection Time: 06/22/23  2:10 PM   Specimen: Anterior Nasal Swab  Result Value Ref Range Status   SARS Coronavirus 2 by RT PCR NEGATIVE NEGATIVE Final    Comment: (NOTE) SARS-CoV-2 target nucleic acids are NOT DETECTED.  The SARS-CoV-2 RNA is generally detectable in upper respiratory specimens during the acute phase of infection. The lowest concentration of SARS-CoV-2 viral copies this assay can detect is 138 copies/mL. A negative result does not preclude SARS-Cov-2 infection and should not be used as the sole basis for treatment or other patient management decisions. A negative result may occur with  improper specimen collection/handling, submission of specimen other than nasopharyngeal swab, presence of viral mutation(s) within the areas targeted by this assay, and inadequate number of viral copies(<138 copies/mL). A negative result must be combined with clinical observations, patient history, and epidemiological information. The expected result is Negative.  Fact Sheet for Patients:  BloggerCourse.com  Fact Sheet for Healthcare Providers:  SeriousBroker.it  This test is no t yet approved or cleared by the [place] FDA and  has been authorized for detection and/or diagnosis of SARS-CoV-2 by FDA under an Emergency Use Authorization (EUA). This EUA will remain  in effect (meaning this test can be used) for the duration of the COVID-19 declaration under Section 564(b)(1) of the Act, 21 U.S.C.section 360bbb-3(b)(1), unless the authorization is terminated  or revoked sooner.       Influenza A by PCR NEGATIVE NEGATIVE Final    Influenza B by PCR NEGATIVE NEGATIVE Final    Comment: (NOTE) The Xpert Xpress SARS-CoV-2/FLU/RSV plus assay is intended as an aid in the diagnosis of influenza from Nasopharyngeal swab specimens and should not be used as a sole basis for treatment. Nasal washings and aspirates are unacceptable for Xpert Xpress SARS-CoV-2/FLU/RSV testing.  Fact Sheet for Patients: BloggerCourse.com  Fact Sheet for Healthcare Providers: SeriousBroker.it  This test is not yet approved or cleared by the [place] FDA and has been authorized for detection and/or diagnosis of SARS-CoV-2 by FDA under an Emergency Use Authorization (EUA). This EUA will remain in effect (meaning this test can be used) for the duration of the COVID-19 declaration under Section 564(b)(1) of the Act, 21 U.S.C. section 360bbb-3(b)(1), unless the authorization is terminated or revoked.     Resp Syncytial Virus by PCR NEGATIVE NEGATIVE Final    Comment: (NOTE) Fact Sheet for Patients: BloggerCourse.com  Fact Sheet for Healthcare Providers: SeriousBroker.it  This test is not yet approved or cleared by the [place] FDA and has been authorized for detection and/or diagnosis of SARS-CoV-2 by FDA under an Emergency Use Authorization (EUA). This EUA will remain in effect (meaning this test can be used) for the duration of the COVID-19 declaration under Section 564(b)(1) of the Act, 21 U.S.C. section 360bbb-3(b)(1), unless the authorization is terminated or revoked.  Performed at [facility], [address], [place], [place] [address]   Culture, blood (Routine X 2) w Reflex to ID Panel     Status: None (Preliminary result)   Collection Time: 06/23/23  5:28 AM   Specimen: BLOOD RIGHT HAND  Result Value Ref Range Status   Specimen Description   Final    BLOOD RIGHT HAND Performed at [facility], [address] [place], [place] [address]    Special Requests   Final    BOTTLES DRAWN AEROBIC AND ANAEROBIC Blood Culture results may not be optimal due to an inadequate volume of blood received in culture bottles Performed at [facility], [address], [place], [place] [address]    Culture   Final    NO GROWTH 2 DAYS Performed at [facility], [address] [place], [place] [address]    Report Status PENDING  Incomplete  Remove and replace urinary cath (placed > 5 days) then obtain urine culture from new indwelling urinary catheter.     Status: None   Collection Time: 06/23/23  2:42 PM   Specimen: Urine, Catheterized  Result Value Ref Range Status   Specimen Description   Final    URINE, CATHETERIZED Performed at [facility], [address], [place], [place] [address]    Special Requests   Final    NONE Performed at [facility], [address], [place], [place] [address]    Culture   Final    NO GROWTH Performed at [facility], [address], [place], [place] [address]    Report Status 06/24/2023 FINAL  Final  MRSA Next Gen by PCR, Nasal     Status: None   Collection Time: 06/23/23  2:44 PM   Specimen: Nasal Mucosa; Nasal Swab  Result Value Ref Range Status   MRSA by PCR Next Gen NOT DETECTED NOT DETECTED Final    Comment: (NOTE) The GeneXpert MRSA Assay (FDA approved for NASAL specimens only), is one component of a comprehensive MRSA colonization surveillance program. It is not intended to diagnose MRSA infection nor to guide or monitor treatment for MRSA infections. Test performance is not FDA approved in patients less than 45 years old. Performed at [facility], [address] [place], [place] [address]          Radiology Studies: [facility] Chest Port 1 View Result Date: 06/23/2023 CLINICAL DATA:  Dyspnea EXAM:  PORTABLE CHEST 1 VIEW COMPARISON:  06/22/2023, chest  CT 04/11/2020, chest x-ray 03/19/2023 FINDINGS: Metallic densities over the right upper chest and right base as before. Emphysema. Hyperlucent right base which may be due to hyper aeration, no discrete pleural line to suggest pneumothorax. Small moderate left effusion increased compared to prior. Suspicion of small right effusion. Airspace disease at left greater than right lung base. Stable cardiomediastinal silhouette with aortic atherosclerosis IMPRESSION: 1. Left greater than right pleural effusions, increased on the left compared to prior with worsened airspace disease at left base. 2. Emphysema Electronically Signed   By: Jasmine Pang M.D.   On: 06/23/2023 16:05           LOS: 3 days   Time spent= 35 mins    Miguel Rota, MD [facility]  If 7PM-7AM, please contact night-coverage  06/25/2023, 10:43 AM

## 2023-06-25 NOTE — Progress Notes (Signed)
 OT Cancellation Note  Patient Details Name: Alexander [name] MRN: [id number] DOB: 04-Oct-1932   Cancelled Treatment:    Reason Eval/Treat Not Completed: Other (comment) Patient is transitioning to hospice services. OT to sign off at this time.  Rosalio Loud, MS Acute Rehabilitation Department Office# [phone]  06/25/2023, 10:56 AM

## 2023-06-25 NOTE — Plan of Care (Signed)
   Problem: Clinical Measurements: Goal: Will remain free from infection Outcome: Progressing   Problem: Clinical Measurements: Goal: Diagnostic test results will improve Outcome: Progressing   Problem: Clinical Measurements: Goal: Respiratory complications will improve Outcome: Progressing

## 2023-06-25 NOTE — Progress Notes (Signed)
 Daily Progress Note   Patient Name: Alexander [name]       Date: 06/25/2023 DOB: 1932/09/13  Age: 89 y.o. MRN#: [id number] Attending Physician: Alexander Rota, MD Primary Care Physician: Alexander Fillers, MD Admit Date: 06/22/2023 Length of Stay: 3 days  Reason for Consultation/Follow-up: Establishing goals of care  Subjective:   CC: Patient notes that he is feeling better today.  Following up regarding complex medical decision making.  Subjective:  Extensive review of EMR performed.  Discussed care with RN, hospitalist, and [facility] liaison to coordinate care.  As per discussions, plan is for patient to return home with hospice support through [facility].  Presented to bedside in afternoon to meet daughter.  Had been discussed that daughter would be present at noon.  Upon presentation to room, RN present at bedside assisting patient with lunch.  Patient's son present at bedside.  Introduced myself as a member of the palliative team.  Discussed how patient's daughter has not been to bedside as of yet.  [facility] liaison noted would follow-up later in the afternoon to coordinate going home with hospice support with daughter.  Spent time interacting with patient who notes that he is feeling "better" at this time.  RN able to feed patient's small bites of dysphagia diet.  Patient still coughing at times.  Son inquiring about pneumonia management.  Discussed how patient has appropriately received antibiotics for management.  Also spent time normalizing deterioration of muscle function and patient's with underlying dementia.  Discussed dysphagia is an expected complication of this disease as a progressive.  Noted would continue food for comfort acknowledging that patient will likely continue to aspirate can cause recurrent pneumonia.  At this time trying to optimize quality time with family over quantity of time.  That is goal patient going home with hospice to enjoy time with family.  Spent time answering questions as  able.  Noted palliative medicine team would be available if needed.  Objective:   Vital Signs:  BP (!) 127/56   Pulse (!) 59   Temp 98 F (36.7 C) (Oral)   Resp 13   Ht 5\' 8"  (1.727 m)   Wt 59.6 kg   SpO2 100%   BMI 19.98 kg/m   Physical Exam: General: NAD, awake, cachectic, frail, pleasantly confused Cardiovascular: RRR Respiratory: no increased work of breathing noted, not in respiratory distress Extremities: Muscle wasting in all extremities Neuro: Only oriented to self  Imaging: I personally reviewed recent imaging.   Assessment & Plan:   Assessment: Patient is an 89 year old male with a past medical history of hypertension, hyperlipidemia, CAD, COPD, dementia, and BPH with chronic indwelling Foley catheter who was admitted from on 06/23/2023 for management of altered mental status mumbling.  Since admission, patient has received management for acute hypoxic respiratory failure secondary to aspiration pneumonia, acute hypoxic respiratory failure with hypercarbia and CO2 narcosis, and concern for UTI now on antibiotics.  Palliative medicine team consulted to assist with complex medical decision making. Of note, patient seen by palliative medicine team previously.  Recommendations/Plan: # Complex medical decision making/goals of care:   -Patient unable to participate in complex medical decision-making due to medical status.  Discussed care with son, Alexander [name], at bedside as detailed above in HPI.  Had presented to bedside when daughter was supposed to be present though has not presented yet.  [facility] liaison to follow-up with daughter to coordinate patient going home with hospice care.  Palliative medicine team will be available if needed.  Please reach out if acute needs arise.                -Patient has medical power of attorney documentation on file.  Patient's daughter, Alexander [name], named is HCPOA with primary alternate noted to be Alexander [name].                 -  Code Status:  Limited: Do not attempt resuscitation (DNR) -DNR-LIMITED -Do Not Intubate/DNI     # Psycho-social/Spiritual Support:  - Support System: Daughters-  Alexander [name] and Alexander [name], sons-Alexander [name] and Alexander [name] as per EMR review    # Discharge Planning: Home with Hospice via [facility]  Discussed with: Hospitalist, RN, patient, patient's son at bedside  Thank you for allowing the palliative care team to participate in the care Alexander [name].  Alexander Morin, DO Palliative Care Provider PMT # [phone]  If patient remains symptomatic despite maximum doses, please call PMT at [phone] between 0700 and 1900. Outside of these hours, please call attending, as PMT does not have night coverage.  Personally spent 35 minutes in patient care including extensive chart review (labs, imaging, progress/consult notes, vital signs), medically appropraite exam, discussed with treatment team, education to patient, family, and staff, documenting clinical information, medication review and management, coordination of care, and available advanced directive documents.

## 2023-06-26 DIAGNOSIS — G9341 Metabolic encephalopathy: Secondary | ICD-10-CM | POA: Diagnosis not present

## 2023-06-26 LAB — COMPREHENSIVE METABOLIC PANEL
ALT: 95 U/L — ABNORMAL HIGH (ref 0–44)
AST: 24 U/L (ref 15–41)
Albumin: 2.7 g/dL — ABNORMAL LOW (ref 3.5–5.0)
Alkaline Phosphatase: 84 U/L (ref 38–126)
Anion gap: 6 (ref 5–15)
BUN: 13 mg/dL (ref 8–23)
CO2: 36 mmol/L — ABNORMAL HIGH (ref 22–32)
Calcium: 8.4 mg/dL — ABNORMAL LOW (ref 8.9–10.3)
Chloride: 102 mmol/L (ref 98–111)
Creatinine, Ser: 0.61 mg/dL (ref 0.61–1.24)
GFR, Estimated: >60 mL/min (ref >60)
Glucose, Bld: 96 mg/dL (ref 70–99)
Potassium: 3.8 mmol/L (ref 3.5–5.1)
Sodium: 144 mmol/L (ref 135–145)
Total Bilirubin: 0.6 mg/dL (ref 0.0–1.2)
Total Protein: 5.4 g/dL — ABNORMAL LOW (ref 6.5–8.1)

## 2023-06-26 LAB — CBC
HCT: 35.1 % — ABNORMAL LOW (ref 39.0–52.0)
Hemoglobin: 10.1 g/dL — ABNORMAL LOW (ref 13.0–17.0)
MCH: 30.4 pg (ref 26.0–34.0)
MCHC: 28.8 g/dL — ABNORMAL LOW (ref 30.0–36.0)
MCV: 105.7 fL — ABNORMAL HIGH (ref 80.0–100.0)
Platelets: 251 10*3/uL (ref 150–400)
RBC: 3.32 MIL/uL — ABNORMAL LOW (ref 4.22–5.81)
RDW: 14.5 % (ref 11.5–15.5)
WBC: 5 10*3/uL (ref 4.0–10.5)
nRBC: 0 % (ref 0.0–0.2)

## 2023-06-26 LAB — GLUCOSE, CAPILLARY
Glucose-Capillary: 69 mg/dL — ABNORMAL LOW (ref 70–99)
Glucose-Capillary: 86 mg/dL (ref 70–99)
Glucose-Capillary: 97 mg/dL (ref 70–99)

## 2023-06-26 LAB — MAGNESIUM: Magnesium: 2 mg/dL (ref 1.7–2.4)

## 2023-06-26 MED ORDER — SODIUM CHLORIDE 0.9 % IV SOLN
3.0000 g | Freq: Four times a day (QID) | INTRAVENOUS | Status: AC
Start: 2023-06-26 — End: 2023-06-29
  Administered 2023-06-26 – 2023-06-28 (×10): 3 g via INTRAVENOUS
  Filled 2023-06-26 (×10): qty 8

## 2023-06-26 NOTE — Progress Notes (Signed)
 PROGRESS NOTE    Alexander [name]  FAO:[id number] DOB: Dec 31, 1932 DOA: 06/22/2023 PCP: [name], MD    Brief Narrative:  89 year old with history of HTN, HLD, nonobstructive CAD, COPD, BPH with chronic indwelling Foley catheter, depression, severe dementia presented from SNF for altered mental status and mumbling.  He has not been eating well or drinking for the past few days.  Recently admitted  from 1/31 - 2/8 UTI tx and eventually discharged to SNF on p.o. antibiotics.  Upon admission noted to be encephalopathic from aspiration pneumonia and urinary tract infection.  He also had hypoxic and hypercarbic respiratory failure requiring BiPAP.  Patient was confirmed DNR/DNI.  Foley catheter was exchanged as well.  Started on broad-spectrum antibiotics.  Patient's family having ongoing discussion with hospice.   Assessment & Plan:  Principal Problem:   Acute metabolic encephalopathy Active Problems:   Normocytic anemia   Moderate dementia with behavioral disturbance   Essential hypertension   COPD, moderate (HCC)   GERD (gastroesophageal reflux disease)   Acute respiratory failure with hypoxia (HCC)     Acute metabolic encephalopathy, slowly improving Acute hypoxic respiratory failure secondary to aspiration pneumonia - Initially required BiPAP due to hypercarbia, now has been weaned down to nasal cannula. - Change Vanc and Meropenem to IV Unasyn.  - Ammonia normal, CT head negative; TSH =Normal  Acute hypoxic respiratory failure with hypercarbia CO2 narcosis - Patient is confirmed DNR/DNI.  CO2 narcosis is improved.   History of COPD   Will continue with oxygen nebulizers.   History of BPH with chronic indwelling Foley catheter UTI Foley catheter exchanged in the ER.  Currently on broad-spectrum antibiotics.    Normocytic anemia Recent hemoglobin of 10.3.  Will continue to monitor.   Dementia with behavioral disturbance History of depression. Outpatient  Risperdal and Ativan on hold   Mild hypernatremia Secondary to dehydration continue IV fluids  Transaminitis - Trend LFTs   Goals of care Confirmed DNR/DNI Palliative re-consulted.   Family aware of long-term prognosis.  Daughter reported to me on 3/2 that they are supposed to have hospice meeting them today as outpatient.  Will make this arrangement today  DVT prophylaxis: enoxaparin (LOVENOX) injection 40 mg Start: 06/22/23 2200  DNR/DNI Family Communication: called Sonje, no answer. Left vm.   Status is: Inpatient Remains inpatient appropriate because: Cont hospital stay.   Subjective: Overall slightly more responsive this morning.  On 4 L nasal cannula. Examination:  General exam: Not in acute distress.   Cachectic frail, muscle wasting.  3-4 L nasal cannula Respiratory system: Shallow breaths otherwise clear, improved Cardiovascular system: S1 & S2 heard, RRR. No JVD, murmurs, rubs, gallops or clicks. No pedal edema. Gastrointestinal system: Abdomen is nondistended, soft and nontender. No organomegaly or masses felt. Normal bowel sounds heard. Central nervous system: Gross moving all the extremities Extremities: No swelling in lower extremities bilaterally. Skin: No rashes, lesions or ulcers Psychiatry: No signs of agitation Catheter in place           Pressure Injury 02/11/23 Sacrum Mid Stage 2 -  Partial thickness loss of dermis presenting as a shallow open injury with a red, pink wound bed without slough. Stage 2 pressure injury on sacrum, present on admission (Active)  02/11/23   Location: Sacrum  Location Orientation: Mid  Staging: Stage 2 -  Partial thickness loss of dermis presenting as a shallow open injury with a red, pink wound bed without slough.  Wound Description (Comments): Stage 2 pressure injury on  sacrum, present on admission  Present on Admission: Yes     Diet Orders (From admission, onward)     Start     Ordered   06/23/23 1627  DIET DYS  2 Room service appropriate? Yes; Fluid consistency: Thin  Diet effective now       Question Answer Comment  Room service appropriate? Yes   Fluid consistency: Thin      06/23/23 1626            Objective: Vitals:   06/25/23 2200 06/26/23 0217 06/26/23 0500 06/26/23 0604  BP: 134/75 129/75  119/61  Pulse: 69 64  (!) 59  Resp: 18 20  18   Temp: 98.2 F (36.8 C) 97.7 F (36.5 C)  98.2 F (36.8 C)  TempSrc: Oral Oral  Oral  SpO2: 100% 100%  100%  Weight:   59 kg   Height:        Intake/Output Summary (Last 24 hours) at 06/26/2023 1025 Last data filed at 06/26/2023 0606 Gross per 24 hour  Intake 303.48 ml  Output 750 ml  Net -446.52 ml   Filed Weights   06/24/23 0500 06/25/23 0500 06/26/23 0500  Weight: 58.1 kg 59.6 kg 59 kg    Scheduled Meds:  Chlorhexidine Gluconate Cloth  6 each Topical Daily   enoxaparin (LOVENOX) injection  40 mg Subcutaneous Q24H   mouth rinse  15 mL Mouth Rinse 4 times per day   sodium chloride flush  3 mL Intravenous Q12H   Continuous Infusions:  ampicillin-sulbactam (UNASYN) IV      Nutritional status     Body mass index is 19.78 kg/m.  Data Reviewed:   CBC: Recent Labs  Lab 06/22/23 1407 06/23/23 0528 06/23/23 1622 06/24/23 0316 06/25/23 0326 06/26/23 0410  WBC 6.1 4.5  --  7.4 5.6 5.0  HGB 10.3* 9.5* 11.6* 10.7* 10.8* 10.1*  HCT 34.2* 32.2* 34.0* 37.9* 38.0* 35.1*  MCV 103.0* 104.5*  --  108.3* 107.3* 105.7*  PLT 307 278  --  243 244 251   Basic Metabolic Panel: Recent Labs  Lab 06/22/23 1407 06/23/23 0528 06/23/23 1622 06/24/23 0316 06/25/23 0326 06/26/23 0410  NA 148* 149* 150* 149* 148* 144  K 3.9 4.2 4.0 4.4 4.0 3.8  CL 107 111  --  109 108 102  CO2 32 34*  --  31 32 36*  GLUCOSE 90 94  --  89 88 96  BUN 24* 19  --  17 13 13   CREATININE 0.86 0.79  --  0.76 0.60* 0.61  CALCIUM 8.7* 8.4*  --  8.8* 8.8* 8.4*  MG  --  2.1  --  2.2 2.0 2.0  PHOS  --   --   --  3.5  --   --    GFR: Estimated Creatinine  Clearance: 53.3 mL/min (by C-G formula based on SCr of 0.61 mg/dL). Liver Function Tests: Recent Labs  Lab 06/22/23 1407 06/24/23 0316 06/25/23 0326 06/26/23 0410  AST 127* 97* 42* 24  ALT 182* 208* 138* 95*  ALKPHOS 133* 103 90 84  BILITOT 0.6 0.7 0.8 0.6  PROT 6.0* 5.7* 5.4* 5.4*  ALBUMIN 3.0* 2.9* 2.7* 2.7*   No results for input(s): "LIPASE", "AMYLASE" in the last 168 hours. Recent Labs  Lab 06/22/23 1407  AMMONIA <10   Coagulation Profile: No results for input(s): "INR", "PROTIME" in the last 168 hours. Cardiac Enzymes: No results for input(s): "CKTOTAL", "CKMB", "CKMBINDEX", "TROPONINI" in the last 168 hours. BNP (last 3  results) No results for input(s): "PROBNP" in the last 8760 hours. HbA1C: No results for input(s): "HGBA1C" in the last 72 hours. CBG: Recent Labs  Lab 06/25/23 0601 06/25/23 1201 06/25/23 1726 06/25/23 2350 06/26/23 0820  GLUCAP 80 80 109* 89 69*   Lipid Profile: No results for input(s): "CHOL", "HDL", "LDLCALC", "TRIG", "CHOLHDL", "LDLDIRECT" in the last 72 hours. Thyroid Function Tests: Recent Labs    06/25/23 0835  TSH 4.278   Anemia Panel: No results for input(s): "VITAMINB12", "FOLATE", "FERRITIN", "TIBC", "IRON", "RETICCTPCT" in the last 72 hours. Sepsis Labs: Recent Labs  Lab 06/23/23 0536  PROCALCITON <0.10    Recent Results (from the past 240 hours)  Culture, blood (Routine X 2) w Reflex to ID Panel     Status: None (Preliminary result)   Collection Time: 06/22/23  2:07 PM   Specimen: BLOOD LEFT ARM  Result Value Ref Range Status   Specimen Description   Final    BLOOD LEFT ARM Performed at [facility], [address], [place], [place] [address]    Special Requests   Final    BOTTLES DRAWN AEROBIC AND ANAEROBIC Blood Culture adequate volume Performed at [facility], [address] [place], [place] [address]    Culture   Final    NO GROWTH 4 DAYS Performed at [facility],  [address] [place], [place] [address]    Report Status PENDING  Incomplete  Resp panel by RT-PCR (RSV, Flu A&B, Covid) Anterior Nasal Swab     Status: None   Collection Time: 06/22/23  2:10 PM   Specimen: Anterior Nasal Swab  Result Value Ref Range Status   SARS Coronavirus 2 by RT PCR NEGATIVE NEGATIVE Final    Comment: (NOTE) SARS-CoV-2 target nucleic acids are NOT DETECTED.  The SARS-CoV-2 RNA is generally detectable in upper respiratory specimens during the acute phase of infection. The lowest concentration of SARS-CoV-2 viral copies this assay can detect is 138 copies/mL. A negative result does not preclude SARS-Cov-2 infection and should not be used as the sole basis for treatment or other patient management decisions. A negative result may occur with  improper specimen collection/handling, submission of specimen other than nasopharyngeal swab, presence of viral mutation(s) within the areas targeted by this assay, and inadequate number of viral copies(<138 copies/mL). A negative result must be combined with clinical observations, patient history, and epidemiological information. The expected result is Negative.  Fact Sheet for Patients:  BloggerCourse.com  Fact Sheet for Healthcare Providers:  SeriousBroker.it  This test is no t yet approved or cleared by the [place] FDA and  has been authorized for detection and/or diagnosis of SARS-CoV-2 by FDA under an Emergency Use Authorization (EUA). This EUA will remain  in effect (meaning this test can be used) for the duration of the COVID-19 declaration under Section 564(b)(1) of the Act, 21 U.S.C.section 360bbb-3(b)(1), unless the authorization is terminated  or revoked sooner.       Influenza A by PCR NEGATIVE NEGATIVE Final   Influenza B by PCR NEGATIVE NEGATIVE Final    Comment: (NOTE) The Xpert Xpress SARS-CoV-2/FLU/RSV plus assay is intended as an aid in the  diagnosis of influenza from Nasopharyngeal swab specimens and should not be used as a sole basis for treatment. Nasal washings and aspirates are unacceptable for Xpert Xpress SARS-CoV-2/FLU/RSV testing.  Fact Sheet for Patients: BloggerCourse.com  Fact Sheet for Healthcare Providers: SeriousBroker.it  This test is not yet approved or cleared by  the [employer] and has been authorized for detection and/or diagnosis of SARS-CoV-2 by FDA under an Emergency Use Authorization (EUA). This EUA will remain in effect (meaning this test can be used) for the duration of the COVID-19 declaration under Section 564(b)(1) of the Act, 21 U.S.C. section 360bbb-3(b)(1), unless the authorization is terminated or revoked.     Resp Syncytial Virus by PCR NEGATIVE NEGATIVE Final    Comment: (NOTE) Fact Sheet for Patients: BloggerCourse.com  Fact Sheet for Healthcare Providers: SeriousBroker.it  This test is not yet approved or cleared by the [place] FDA and has been authorized for detection and/or diagnosis of SARS-CoV-2 by FDA under an Emergency Use Authorization (EUA). This EUA will remain in effect (meaning this test can be used) for the duration of the COVID-19 declaration under Section 564(b)(1) of the Act, 21 U.S.C. section 360bbb-3(b)(1), unless the authorization is terminated or revoked.  Performed at [facility], [address] [place], [place] [address]   Culture, blood (Routine X 2) w Reflex to ID Panel     Status: None (Preliminary result)   Collection Time: 06/23/23  5:28 AM   Specimen: BLOOD RIGHT HAND  Result Value Ref Range Status   Specimen Description   Final    BLOOD RIGHT HAND Performed at [facility], [address] [place], [place] [address]    Special Requests   Final    BOTTLES DRAWN AEROBIC AND ANAEROBIC Blood Culture results may  not be optimal due to an inadequate volume of blood received in culture bottles Performed at [facility], [address] [place], [place] [address]    Culture   Final    NO GROWTH 3 DAYS Performed at [facility], [address], [place], [place] [address]    Report Status PENDING  Incomplete  Remove and replace urinary cath (placed > 5 days) then obtain urine culture from new indwelling urinary catheter.     Status: None   Collection Time: 06/23/23  2:42 PM   Specimen: Urine, Catheterized  Result Value Ref Range Status   Specimen Description   Final    URINE, CATHETERIZED Performed at [facility], [address] [place], [place] [address]    Special Requests   Final    NONE Performed at [facility], [address], [place], [place] [address]    Culture   Final    NO GROWTH Performed at [facility], [address], [place], [place] [address]    Report Status 06/24/2023 FINAL  Final  MRSA Next Gen by PCR, Nasal     Status: None   Collection Time: 06/23/23  2:44 PM   Specimen: Nasal Mucosa; Nasal Swab  Result Value Ref Range Status   MRSA by PCR Next Gen NOT DETECTED NOT DETECTED Final    Comment: (NOTE) The GeneXpert MRSA Assay (FDA approved for NASAL specimens only), is one component of a comprehensive MRSA colonization surveillance program. It is not intended to diagnose MRSA infection nor to guide or monitor treatment for MRSA infections. Test performance is not FDA approved in patients less than 58 years old. Performed at [facility], [address] [place], [place] [address]          Radiology Studies: No results found.         LOS: 4 days   Time spent= 35 mins    Miguel Rota, MD [facility]  If 7PM-7AM, please contact night-coverage  06/26/2023, 10:25 AM

## 2023-06-26 NOTE — TOC Progression Note (Signed)
 Transition of Care [facility]) - Progression Note   Patient Details  Name: Alexander [name] MRN: [id number] Date of Birth: 09/02/1932  [facility]) CM/SW Contact  Ewing Schlein, LCSW Phone Number: 06/26/2023, 1:30 PM  Clinical Narrative: CSW met with patient's daughter, Rosanne Gutting, and patient although patient was sleeping. CSW spoke with daughter about returning home with hospice through [facility] rather than returning to [facility] for rehab. Daughter asked about what assistance hospice can provide at home and she is aware it will not cover 24/7 care. Per daughter, family is looking into personal care services but has found that this is expensive for the patient and the family. Daughter confirmed family will need DME in the home prior to discharge. TOC to follow.    Expected Discharge Plan: Home w Hospice Care ([facility]) Barriers to Discharge: Continued Medical Work up  Expected Discharge Plan and Services In-house Referral: Clinical Social Work Post Acute Care Choice: Hospice Living arrangements for the past 2 months: Skilled Nursing Facility DME Agency: NA HH Arranged: NA  Social Determinants of Health (SDOH) Interventions SDOH Screenings   Food Insecurity: No Food Insecurity (05/26/2023)  Housing: Low Risk  (05/26/2023)  Transportation Needs: No Transportation Needs (05/26/2023)  Utilities: Not At Risk (05/26/2023)  Depression (PHQ2-9): Low Risk  (12/16/2020)  Financial Resource Strain: Low Risk  (09/07/2017)  Physical Activity: Sufficiently Active (09/07/2017)  Social Connections: Moderately Isolated (05/26/2023)  Stress: No Stress Concern Present (09/07/2017)  Tobacco Use: Medium Risk (06/25/2023)   Readmission Risk Interventions    06/24/2023   10:27 AM 05/29/2023    3:53 PM 03/20/2023    2:33 PM  Readmission Risk Prevention Plan  Transportation Screening Complete Complete Complete  Medication Review (RN Care Manager) Complete Referral to Pharmacy Referral to Pharmacy   PCP or Specialist appointment within 3-5 days of discharge Complete Complete Complete  HRI or Home Care Consult Complete Complete Complete  SW Recovery Care/Counseling Consult Complete Complete Complete  Palliative Care Screening Not Applicable Not Applicable Not Applicable  Skilled Nursing Facility Complete Not Applicable Not Applicable

## 2023-06-26 NOTE — Progress Notes (Signed)
 Pharmacy Antibiotic Note  Alexander [name] is a 89 y.o. male admitted on 06/22/2023. Pt has been on broad spectrum antibiotics for aspiration PNA. Pharmacy consulted to narrow to ampicillin/sulbactam to complete 7 days total course of therapy.   Today, 06/26/23 WBC WNL, afebrile Day #5 IV antibiotics  Plan: Ampicillin/sulbactam 3 g IV q6h through 3/6 Follow renal function  Height: 5\' 8"  (172.7 cm) Weight: 59 kg (130 lb 1.1 oz) IBW/kg (Calculated) : 68.4  Temp (24hrs), Avg:98.2 F (36.8 C), Min:97.7 F (36.5 C), Max:98.8 F (37.1 C)  Recent Labs  Lab 06/22/23 1407 06/23/23 0528 06/24/23 0316 06/25/23 0326 06/26/23 0410  WBC 6.1 4.5 7.4 5.6 5.0  CREATININE 0.86 0.79 0.76 0.60* 0.61    Estimated Creatinine Clearance: 53.3 mL/min (by C-G formula based on SCr of 0.61 mg/dL).    No Known Allergies  Antimicrobials this admission: Ampicillin/sulbactam 3/4 >> (3/6) vancomycin 2/28 >> 3/3 Meropenem 3/1 >> 3/4 Piperacillin/tazobactam 2/28 > 3/1  Dose adjustments this admission:  Microbiology results: 3/1 BCx: ngtd 2/28 BCx: ngtd 3/1 UCx: ngF  3/1 MRSA PCR: Not detected  Cindi Carbon, PharmD 06/26/2023 8:22 AM

## 2023-06-26 NOTE — Progress Notes (Signed)
  Daily Progress Note   Patient Name: Alexander [name]       Date: 06/26/2023 DOB: 24-May-1932  Age: 89 y.o. MRN#: [id number] Attending Physician: Miguel Rota, MD Primary Care Physician: [name], MD Admit Date: 06/22/2023 Length of Stay: 4 days  Reason for Consultation/Follow-up: Establishing goals of care  Subjective:   CC: Patient in no distress  Following up regarding complex medical decision making.  Subjective:  No distress  Objective:   Vital Signs:  BP 123/71 (BP Location: Right Arm)   Pulse 84   Temp 98 F (36.7 C) (Oral)   Resp 20   Ht 5\' 8"  (1.727 m)   Wt 59 kg   SpO2 100%   BMI 19.78 kg/m   Physical Exam: General: NAD, awake, cachectic, frail, pleasantly confused Cardiovascular: RRR Respiratory: no increased work of breathing noted, not in respiratory distress Extremities: Muscle wasting in all extremities Neuro: Only oriented to self  Imaging: I personally reviewed recent imaging.   Assessment & Plan:   Assessment: Patient is an 89 year old male with a past medical history of hypertension, hyperlipidemia, CAD, COPD, dementia, and BPH with chronic indwelling Foley catheter who was admitted from on 06/23/2023 for management of altered mental status mumbling.  Since admission, patient has received management for acute hypoxic respiratory failure secondary to aspiration pneumonia, acute hypoxic respiratory failure with hypercarbia and CO2 narcosis, and concern for UTI now on antibiotics.  Palliative medicine team consulted to assist with complex medical decision making. Of note, patient seen by palliative medicine team previously.  Recommendations/Plan: # Complex medical decision making/goals of care:   TOC note reviewed, plan for home with hospice.                 -  Code Status: Limited: Do not attempt resuscitation (DNR) -DNR-LIMITED -Do Not Intubate/DNI     # Psycho-social/Spiritual Support:  - Support System: Daughters-  Sonja and [place],  sons-Darryl and [place] as per EMR review    # Discharge Planning: Home with Hospice via [facility]  Discussed with: IDT  Thank you for allowing the palliative care team to participate in the care Alexander [name].  Low MDM Rosalin Hawking MD Palliative Care Provider PMT # [phone]  If patient remains symptomatic despite maximum doses, please call PMT at [phone] between 0700 and 1900. Outside of these hours, please call attending, as PMT does not have night coverage.

## 2023-06-26 NOTE — Progress Notes (Signed)
 [facility] Liaison Note  Received notification that patient may be medically stable for discharge Thursday to home with the support of hospice services.  Called daughter, [name], to discuss DME needs. Sonja requests Hospital bed, O2 @ 4 lpm, lightweight wheelchair and rollator to be delivered to the home.  Will continue to follow for discharge disposition.  Please call with any hospice related questions or concerns.  Thank you, Haynes Bast, BSN, [facility] Liaison [phone]

## 2023-06-27 DIAGNOSIS — G9341 Metabolic encephalopathy: Secondary | ICD-10-CM | POA: Diagnosis not present

## 2023-06-27 LAB — COMPREHENSIVE METABOLIC PANEL
ALT: 69 U/L — ABNORMAL HIGH (ref 0–44)
AST: 18 U/L (ref 15–41)
Albumin: 2.5 g/dL — ABNORMAL LOW (ref 3.5–5.0)
Alkaline Phosphatase: 72 U/L (ref 38–126)
Anion gap: 8 (ref 5–15)
BUN: 15 mg/dL (ref 8–23)
CO2: 33 mmol/L — ABNORMAL HIGH (ref 22–32)
Calcium: 8.4 mg/dL — ABNORMAL LOW (ref 8.9–10.3)
Chloride: 104 mmol/L (ref 98–111)
Creatinine, Ser: 0.63 mg/dL (ref 0.61–1.24)
GFR, Estimated: >60 mL/min (ref >60)
Glucose, Bld: 70 mg/dL (ref 70–99)
Potassium: 4.4 mmol/L (ref 3.5–5.1)
Sodium: 145 mmol/L (ref 135–145)
Total Bilirubin: 0.7 mg/dL (ref 0.0–1.2)
Total Protein: 5 g/dL — ABNORMAL LOW (ref 6.5–8.1)

## 2023-06-27 LAB — CULTURE, BLOOD (ROUTINE X 2)
Culture: NO GROWTH
Special Requests: ADEQUATE

## 2023-06-27 LAB — CBC
HCT: 35.2 % — ABNORMAL LOW (ref 39.0–52.0)
Hemoglobin: 9.9 g/dL — ABNORMAL LOW (ref 13.0–17.0)
MCH: 31 pg (ref 26.0–34.0)
MCHC: 28.1 g/dL — ABNORMAL LOW (ref 30.0–36.0)
MCV: 110.3 fL — ABNORMAL HIGH (ref 80.0–100.0)
Platelets: 198 10*3/uL (ref 150–400)
RBC: 3.19 MIL/uL — ABNORMAL LOW (ref 4.22–5.81)
RDW: 14.3 % (ref 11.5–15.5)
WBC: 5.7 10*3/uL (ref 4.0–10.5)
nRBC: 0 % (ref 0.0–0.2)

## 2023-06-27 LAB — GLUCOSE, CAPILLARY
Glucose-Capillary: 122 mg/dL — ABNORMAL HIGH (ref 70–99)
Glucose-Capillary: 76 mg/dL (ref 70–99)
Glucose-Capillary: 84 mg/dL (ref 70–99)
Glucose-Capillary: 90 mg/dL (ref 70–99)

## 2023-06-27 LAB — MAGNESIUM: Magnesium: 2.3 mg/dL (ref 1.7–2.4)

## 2023-06-27 NOTE — Progress Notes (Signed)
 SLP Cancellation Note  Patient Details Name: Alexander [name] MRN: [id number] DOB: Jul 01, 1932   Cancelled treatment:       Reason Eval/Treat Not Completed: (P) Other (comment);Fatigue/lethargy limiting ability to participate (pt going home with hospice per chart review, family aware of aspiration risk, comfort po and goal is to focus on time together per chart review of meeting with palliative, will sign off)  Alexander Infante, MS [facility] SLP Acute Rehab Services Office [phone]   Alexander [name] 06/27/2023, 2:04 PM

## 2023-06-27 NOTE — Progress Notes (Signed)
  Daily Progress Note   Patient Name: Alexander [name]       Date: 06/27/2023 DOB: 11/14/1932  Age: 89 y.o. MRN#: [id number] Attending Physician: Miguel Rota, MD Primary Care Physician: [name], MD Admit Date: 06/22/2023 Length of Stay: 5 days  Reason for Consultation/Follow-up: Establishing goals of care  Subjective:   CC: Patient in no distress  Following up regarding complex medical decision making.  Subjective:  No distress, resting in bed.   Objective:   Vital Signs:  BP 130/64 (BP Location: Right Arm)   Pulse 71   Temp 98.4 F (36.9 C) (Oral)   Resp 16   Ht 5\' 8"  (1.727 m)   Wt 59 kg   SpO2 100%   BMI 19.78 kg/m   Physical Exam: General: NAD, awake, cachectic, frail, pleasantly confused Cardiovascular: RRR Respiratory: no increased work of breathing noted, not in respiratory distress Extremities: Muscle wasting in all extremities Neuro: Only oriented to self  Imaging: I personally reviewed recent imaging.   Assessment & Plan:   Assessment: Patient is an 89 year old male with a past medical history of hypertension, hyperlipidemia, CAD, COPD, dementia, and BPH with chronic indwelling Foley catheter who was admitted from on 06/23/2023 for management of altered mental status mumbling.  Since admission, patient has received management for acute hypoxic respiratory failure secondary to aspiration pneumonia, acute hypoxic respiratory failure with hypercarbia and CO2 narcosis, and concern for UTI now on antibiotics.  Palliative medicine team consulted to assist with complex medical decision making. Of note, patient seen by palliative medicine team previously.  Recommendations/Plan: # Complex medical decision making/goals of care:   TOC note reviewed, plan for home with hospice.                 -  Code Status: Limited: Do not attempt resuscitation (DNR) -DNR-LIMITED -Do Not Intubate/DNI     # Psycho-social/Spiritual Support:  - Support System: Daughters-   Sonja and [place], sons-Darryl and [place] as per EMR review   daughter [name] is Product manager # Discharge Planning: Home with Hospice via [facility]  Discussed with: IDT  Thank you for allowing the palliative care team to participate in the care [name].  Low MDM Rosalin Hawking MD Palliative Care Provider PMT # [phone]  If patient remains symptomatic despite maximum doses, please call PMT at [phone] between 0700 and 1900. Outside of these hours, please call attending, as PMT does not have night coverage.

## 2023-06-27 NOTE — Progress Notes (Signed)
 PROGRESS NOTE    Alexander [name]  ZOX:[id number] DOB: 05-18-1932 DOA: 06/22/2023 PCP: [name], MD    Brief Narrative:  89 year old with history of HTN, HLD, nonobstructive CAD, COPD, BPH with chronic indwelling Foley catheter, depression, severe dementia presented from SNF for altered mental status and mumbling.  He has not been eating well or drinking for the past few days.  Recently admitted  from 1/31 - 2/8 UTI tx and eventually discharged to SNF on p.o. antibiotics.  Upon admission noted to be encephalopathic from aspiration pneumonia and urinary tract infection.  He also had hypoxic and hypercarbic respiratory failure requiring BiPAP.  Patient was confirmed DNR/DNI.  Foley catheter was exchanged as well.  Started on broad-spectrum antibiotics.  Patient's family having ongoing discussion with hospice.   Assessment & Plan:  Principal Problem:   Acute metabolic encephalopathy Active Problems:   Normocytic anemia   Moderate dementia with behavioral disturbance   Essential hypertension   COPD, moderate (HCC)   GERD (gastroesophageal reflux disease)   Acute respiratory failure with hypoxia (HCC)     Acute metabolic encephalopathy, slowly improving Acute hypoxic respiratory failure secondary to aspiration pneumonia - Initially required BiPAP due to hypercarbia, now has been weaned down to 4L nasal cannula. - Change Vanc and Meropenem to IV Unasyn.  - Ammonia normal, CT head negative; TSH =Normal  Acute hypoxic respiratory failure with hypercarbia CO2 narcosis - Patient is confirmed DNR/DNI.  CO2 narcosis is improved.   History of COPD   Will continue with oxygen nebulizers.   History of BPH with chronic indwelling Foley catheter UTI Foley catheter exchanged..  Currently on broad-spectrum antibiotics.    Normocytic anemia Recent hemoglobin of 10.3.  Will continue to monitor.   Dementia with behavioral disturbance History of depression. Outpatient Risperdal and  Ativan on hold   Mild hypernatremia Secondary to dehydration continue IV fluids  Transaminitis - Trend LFTs   Goals of care Confirmed DNR/DNI Palliative re-consulted.   Family aware of long-term prognosis. Hospice to assist family upon placement.   DVT prophylaxis: Lovenox  DNR/DNI Family Communication: called Sonja Status is: Inpatient Remains inpatient appropriate because: Cont hospital stay.   Subjective: Laying in the bed no complaints.  Poor oral intake over last 24 hours. Appears to be more comfortable  Examination:  General exam: Not in acute distress.   Cachectic frail, muscle wasting.  3-4 L nasal cannula Respiratory system: Shallow breaths otherwise clear, improved Cardiovascular system: S1 & S2 heard, RRR. No JVD, murmurs, rubs, gallops or clicks. No pedal edema. Gastrointestinal system: Abdomen is nondistended, soft and nontender. No organomegaly or masses felt. Normal bowel sounds heard. Central nervous system: Gross moving all the extremities Extremities: No swelling in lower extremities bilaterally. Skin: No rashes, lesions or ulcers Psychiatry: No signs of agitation Catheter in place           Pressure Injury 02/11/23 Sacrum Mid Stage 2 -  Partial thickness loss of dermis presenting as a shallow open injury with a red, pink wound bed without slough. Stage 2 pressure injury on sacrum, present on admission (Active)  02/11/23   Location: Sacrum  Location Orientation: Mid  Staging: Stage 2 -  Partial thickness loss of dermis presenting as a shallow open injury with a red, pink wound bed without slough.  Wound Description (Comments): Stage 2 pressure injury on sacrum, present on admission  Present on Admission: Yes     Diet Orders (From admission, onward)     Start  Ordered   06/23/23 1627  DIET DYS 2 Room service appropriate? Yes; Fluid consistency: Thin  Diet effective now       Question Answer Comment  Room service appropriate? Yes   Fluid  consistency: Thin      06/23/23 1626            Objective: Vitals:   06/26/23 0604 06/26/23 1039 06/26/23 1954 06/27/23 0605  BP: 119/61 123/71 110/62 130/64  Pulse: (!) 59 84 66 71  Resp: 18 20 16 16   Temp: 98.2 F (36.8 C) 98 F (36.7 C) 98.4 F (36.9 C) 98.4 F (36.9 C)  TempSrc: Oral Oral Oral Oral  SpO2: 100% 100% 100% 100%  Weight:      Height:        Intake/Output Summary (Last 24 hours) at 06/27/2023 1126 Last data filed at 06/26/2023 2328 Gross per 24 hour  Intake 408.35 ml  Output 250 ml  Net 158.35 ml   Filed Weights   06/24/23 0500 06/25/23 0500 06/26/23 0500  Weight: 58.1 kg 59.6 kg 59 kg    Scheduled Meds:  Chlorhexidine Gluconate Cloth  6 each Topical Daily   enoxaparin (LOVENOX) injection  40 mg Subcutaneous Q24H   mouth rinse  15 mL Mouth Rinse 4 times per day   sodium chloride flush  3 mL Intravenous Q12H   Continuous Infusions:  ampicillin-sulbactam (UNASYN) IV 3 g (06/27/23 0615)    Nutritional status     Body mass index is 19.78 kg/m.  Data Reviewed:   CBC: Recent Labs  Lab 06/23/23 0528 06/23/23 1622 06/24/23 0316 06/25/23 0326 06/26/23 0410 06/27/23 0347  WBC 4.5  --  7.4 5.6 5.0 5.7  HGB 9.5* 11.6* 10.7* 10.8* 10.1* 9.9*  HCT 32.2* 34.0* 37.9* 38.0* 35.1* 35.2*  MCV 104.5*  --  108.3* 107.3* 105.7* 110.3*  PLT 278  --  243 244 251 198   Basic Metabolic Panel: Recent Labs  Lab 06/23/23 0528 06/23/23 1622 06/24/23 0316 06/25/23 0326 06/26/23 0410 06/27/23 0347  NA 149* 150* 149* 148* 144 145  K 4.2 4.0 4.4 4.0 3.8 4.4  CL 111  --  109 108 102 104  CO2 34*  --  31 32 36* 33*  GLUCOSE 94  --  89 88 96 70  BUN 19  --  17 13 13 15   CREATININE 0.79  --  0.76 0.60* 0.61 0.63  CALCIUM 8.4*  --  8.8* 8.8* 8.4* 8.4*  MG 2.1  --  2.2 2.0 2.0 2.3  PHOS  --   --  3.5  --   --   --    GFR: Estimated Creatinine Clearance: 53.3 mL/min (by C-G formula based on SCr of 0.63 mg/dL). Liver Function Tests: Recent Labs  Lab  06/22/23 1407 06/24/23 0316 06/25/23 0326 06/26/23 0410 06/27/23 0347  AST 127* 97* 42* 24 18  ALT 182* 208* 138* 95* 69*  ALKPHOS 133* 103 90 84 72  BILITOT 0.6 0.7 0.8 0.6 0.7  PROT 6.0* 5.7* 5.4* 5.4* 5.0*  ALBUMIN 3.0* 2.9* 2.7* 2.7* 2.5*   No results for input(s): "LIPASE", "AMYLASE" in the last 168 hours. Recent Labs  Lab 06/22/23 1407  AMMONIA <10   Coagulation Profile: No results for input(s): "INR", "PROTIME" in the last 168 hours. Cardiac Enzymes: No results for input(s): "CKTOTAL", "CKMB", "CKMBINDEX", "TROPONINI" in the last 168 hours. BNP (last 3 results) No results for input(s): "PROBNP" in the last 8760 hours. HbA1C: No results for input(s): "HGBA1C"  in the last 72 hours. CBG: Recent Labs  Lab 06/26/23 0820 06/26/23 1157 06/26/23 1811 06/27/23 0008 06/27/23 0603  GLUCAP 69* 86 97 84 76   Lipid Profile: No results for input(s): "CHOL", "HDL", "LDLCALC", "TRIG", "CHOLHDL", "LDLDIRECT" in the last 72 hours. Thyroid Function Tests: Recent Labs    06/25/23 0835  TSH 4.278   Anemia Panel: No results for input(s): "VITAMINB12", "FOLATE", "FERRITIN", "TIBC", "IRON", "RETICCTPCT" in the last 72 hours. Sepsis Labs: Recent Labs  Lab 06/23/23 0536  PROCALCITON <0.10    Recent Results (from the past 240 hours)  Culture, blood (Routine X 2) w Reflex to ID Panel     Status: None   Collection Time: 06/22/23  2:07 PM   Specimen: BLOOD LEFT ARM  Result Value Ref Range Status   Specimen Description   Final    BLOOD LEFT ARM Performed at [facility], [address], [place], [place] [address]    Special Requests   Final    BOTTLES DRAWN AEROBIC AND ANAEROBIC Blood Culture adequate volume Performed at [facility], [address] [place], [place] [address]    Culture   Final    NO GROWTH 5 DAYS Performed at [facility], [address] [place], [place] [address]    Report Status 06/27/2023 FINAL  Final  Resp panel by  RT-PCR (RSV, Flu A&B, Covid) Anterior Nasal Swab     Status: None   Collection Time: 06/22/23  2:10 PM   Specimen: Anterior Nasal Swab  Result Value Ref Range Status   SARS Coronavirus 2 by RT PCR NEGATIVE NEGATIVE Final    Comment: (NOTE) SARS-CoV-2 target nucleic acids are NOT DETECTED.  The SARS-CoV-2 RNA is generally detectable in upper respiratory specimens during the acute phase of infection. The lowest concentration of SARS-CoV-2 viral copies this assay can detect is 138 copies/mL. A negative result does not preclude SARS-Cov-2 infection and should not be used as the sole basis for treatment or other patient management decisions. A negative result may occur with  improper specimen collection/handling, submission of specimen other than nasopharyngeal swab, presence of viral mutation(s) within the areas targeted by this assay, and inadequate number of viral copies(<138 copies/mL). A negative result must be combined with clinical observations, patient history, and epidemiological information. The expected result is Negative.  Fact Sheet for Patients:  BloggerCourse.com  Fact Sheet for Healthcare Providers:  SeriousBroker.it  This test is no t yet approved or cleared by the [place] FDA and  has been authorized for detection and/or diagnosis of SARS-CoV-2 by FDA under an Emergency Use Authorization (EUA). This EUA will remain  in effect (meaning this test can be used) for the duration of the COVID-19 declaration under Section 564(b)(1) of the Act, 21 U.S.C.section 360bbb-3(b)(1), unless the authorization is terminated  or revoked sooner.       Influenza A by PCR NEGATIVE NEGATIVE Final   Influenza B by PCR NEGATIVE NEGATIVE Final    Comment: (NOTE) The Xpert Xpress SARS-CoV-2/FLU/RSV plus assay is intended as an aid in the diagnosis of influenza from Nasopharyngeal swab specimens and should not be used as a sole basis  for treatment. Nasal washings and aspirates are unacceptable for Xpert Xpress SARS-CoV-2/FLU/RSV testing.  Fact Sheet for Patients: BloggerCourse.com  Fact Sheet for Healthcare Providers: SeriousBroker.it  This test is not yet approved or cleared by the [place] FDA and has been authorized for detection and/or diagnosis of SARS-CoV-2 by FDA under an  Emergency Use Authorization (EUA). This EUA will remain in effect (meaning this test can be used) for the duration of the COVID-19 declaration under Section 564(b)(1) of the Act, 21 U.S.C. section 360bbb-3(b)(1), unless the authorization is terminated or revoked.     Resp Syncytial Virus by PCR NEGATIVE NEGATIVE Final    Comment: (NOTE) Fact Sheet for Patients: BloggerCourse.com  Fact Sheet for Healthcare Providers: SeriousBroker.it  This test is not yet approved or cleared by the [place] FDA and has been authorized for detection and/or diagnosis of SARS-CoV-2 by FDA under an Emergency Use Authorization (EUA). This EUA will remain in effect (meaning this test can be used) for the duration of the COVID-19 declaration under Section 564(b)(1) of the Act, 21 U.S.C. section 360bbb-3(b)(1), unless the authorization is terminated or revoked.  Performed at [facility], [address] [place], [place] [address]   Culture, blood (Routine X 2) w Reflex to ID Panel     Status: None (Preliminary result)   Collection Time: 06/23/23  5:28 AM   Specimen: BLOOD RIGHT HAND  Result Value Ref Range Status   Specimen Description   Final    BLOOD RIGHT HAND Performed at [facility], [address], [place], [place] [address]    Special Requests   Final    BOTTLES DRAWN AEROBIC AND ANAEROBIC Blood Culture results may not be optimal due to an inadequate volume of blood received in culture bottles Performed at  [facility], [address] [place], [place] [address]    Culture   Final    NO GROWTH 4 DAYS Performed at [facility], [address], [place], [place] [address]    Report Status PENDING  Incomplete  Remove and replace urinary cath (placed > 5 days) then obtain urine culture from new indwelling urinary catheter.     Status: None   Collection Time: 06/23/23  2:42 PM   Specimen: Urine, Catheterized  Result Value Ref Range Status   Specimen Description   Final    URINE, CATHETERIZED Performed at [facility], [address], [place], [place] [address]    Special Requests   Final    NONE Performed at [facility], [address], [place], [place] [address]    Culture   Final    NO GROWTH Performed at [facility], [address], [place], [place] [address]    Report Status 06/24/2023 FINAL  Final  MRSA Next Gen by PCR, Nasal     Status: None   Collection Time: 06/23/23  2:44 PM   Specimen: Nasal Mucosa; Nasal Swab  Result Value Ref Range Status   MRSA by PCR Next Gen NOT DETECTED NOT DETECTED Final    Comment: (NOTE) The GeneXpert MRSA Assay (FDA approved for NASAL specimens only), is one component of a comprehensive MRSA colonization surveillance program. It is not intended to diagnose MRSA infection nor to guide or monitor treatment for MRSA infections. Test performance is not FDA approved in patients less than 44 years old. Performed at [facility], [address] [place], [place] [address]          Radiology Studies: No results found.         LOS: 5 days   Time spent= 35 mins    Miguel Rota, MD [facility]  If 7PM-7AM, please contact night-coverage  06/27/2023, 11:26 AM

## 2023-06-28 DIAGNOSIS — G9341 Metabolic encephalopathy: Secondary | ICD-10-CM | POA: Diagnosis not present

## 2023-06-28 LAB — GLUCOSE, CAPILLARY
Glucose-Capillary: 106 mg/dL — ABNORMAL HIGH (ref 70–99)
Glucose-Capillary: 133 mg/dL — ABNORMAL HIGH (ref 70–99)
Glucose-Capillary: 136 mg/dL — ABNORMAL HIGH (ref 70–99)
Glucose-Capillary: 68 mg/dL — ABNORMAL LOW (ref 70–99)
Glucose-Capillary: 73 mg/dL (ref 70–99)
Glucose-Capillary: 92 mg/dL (ref 70–99)

## 2023-06-28 LAB — CBC
HCT: 33.4 % — ABNORMAL LOW (ref 39.0–52.0)
Hemoglobin: 9.3 g/dL — ABNORMAL LOW (ref 13.0–17.0)
MCH: 30.1 pg (ref 26.0–34.0)
MCHC: 27.8 g/dL — ABNORMAL LOW (ref 30.0–36.0)
MCV: 108.1 fL — ABNORMAL HIGH (ref 80.0–100.0)
Platelets: 213 10*3/uL (ref 150–400)
RBC: 3.09 MIL/uL — ABNORMAL LOW (ref 4.22–5.81)
RDW: 14.2 % (ref 11.5–15.5)
WBC: 5.1 10*3/uL (ref 4.0–10.5)
nRBC: 0 % (ref 0.0–0.2)

## 2023-06-28 LAB — COMPREHENSIVE METABOLIC PANEL
ALT: 52 U/L — ABNORMAL HIGH (ref 0–44)
AST: 16 U/L (ref 15–41)
Albumin: 2.5 g/dL — ABNORMAL LOW (ref 3.5–5.0)
Alkaline Phosphatase: 68 U/L (ref 38–126)
Anion gap: 5 (ref 5–15)
BUN: 17 mg/dL (ref 8–23)
CO2: 37 mmol/L — ABNORMAL HIGH (ref 22–32)
Calcium: 8.5 mg/dL — ABNORMAL LOW (ref 8.9–10.3)
Chloride: 103 mmol/L (ref 98–111)
Creatinine, Ser: 0.6 mg/dL — ABNORMAL LOW (ref 0.61–1.24)
GFR, Estimated: >60 mL/min (ref >60)
Glucose, Bld: 86 mg/dL (ref 70–99)
Potassium: 4 mmol/L (ref 3.5–5.1)
Sodium: 145 mmol/L (ref 135–145)
Total Bilirubin: 0.6 mg/dL (ref 0.0–1.2)
Total Protein: 4.8 g/dL — ABNORMAL LOW (ref 6.5–8.1)

## 2023-06-28 LAB — MAGNESIUM: Magnesium: 2.1 mg/dL (ref 1.7–2.4)

## 2023-06-28 LAB — CULTURE, BLOOD (ROUTINE X 2)

## 2023-06-28 MED ORDER — DEXTROSE-SODIUM CHLORIDE 5-0.45 % IV SOLN: INTRAVENOUS | Status: DC

## 2023-06-28 MED ORDER — DEXTROSE 50 % IV SOLN
INTRAVENOUS | Status: AC
  Administered 2023-06-28: 25 mL
  Filled 2023-06-28: qty 50

## 2023-06-28 NOTE — Progress Notes (Signed)
 PROGRESS NOTE    Alexander [name]  UXL:[id number] DOB: [birth year] DOA: 06/22/2023 PCP: [name], MD    Brief Narrative:  89 year old with history of HTN, HLD, nonobstructive CAD, COPD, BPH with chronic indwelling Foley catheter, depression, severe dementia presented from SNF for altered mental status and mumbling.  He has not been eating well or drinking for the past few days.  Recently admitted  from 1/31 - 2/8 UTI tx and eventually discharged to SNF on p.o. antibiotics.  Upon admission noted to be encephalopathic from aspiration pneumonia and urinary tract infection.  He also had hypoxic and hypercarbic respiratory failure requiring BiPAP.  Patient was confirmed DNR/DNI.  Foley catheter was exchanged as well.  Started on broad-spectrum antibiotics, Now on Unasyn and improving. Down to 2L [place] today, will wean this off.  TOC working with family to make home arrangements.    Assessment & Plan:  Principal Problem:   Acute metabolic encephalopathy Active Problems:   Normocytic anemia   Moderate dementia with behavioral disturbance   Essential hypertension   COPD, moderate (HCC)   GERD (gastroesophageal reflux disease)   Acute respiratory failure with hypoxia (HCC)     Acute metabolic encephalopathy, slowly improving Acute hypoxic respiratory failure secondary to aspiration pneumonia - Initially on BiPAP, now on 2L [place].  - Change Vanc and Meropenem to IV Unasyn. EOT 3/6 - Ammonia normal, CT head negative; TSH =Normal  Acute hypoxic respiratory failure with hypercarbia CO2 narcosis - Patient is confirmed DNR/DNI.  CO2 narcosis is improved.   History of COPD   Will continue with oxygen nebulizers.   History of BPH with chronic indwelling Foley catheter UTI Foley catheter exchanged. On Abx.    Normocytic anemia Recent hemoglobin of 10.3.  Will continue to monitor.   Dementia with behavioral disturbance History of depression. Outpatient Risperdal and Ativan on hold    Mild hypernatremia Secondary to dehydration continue IV fluids  Transaminitis - Trend LFTs   Goals of care Confirmed DNR/DNI Palliative re-consulted.   Family aware of long-term prognosis. Hospice to assist family upon placement.   DVT prophylaxis: Lovenox  DNR/DNI Family Communication: called Sonja Status is: Inpatient Remains inpatient appropriate because: Home once we have made arrangements for him. Today is the last day of Abx.  Hospice expecting discharge tomorrow once everything is delivered  Subjective: Doing okay no complaints.  Examination:  General exam: Not in acute distress.   Cachectic frail, muscle wasting.  2 L nasal cannula Respiratory system: Shallow breaths otherwise clear, improved Cardiovascular system: S1 & S2 heard, RRR. No JVD, murmurs, rubs, gallops or clicks. No pedal edema. Gastrointestinal system: Abdomen is nondistended, soft and nontender. No organomegaly or masses felt. Normal bowel sounds heard. Central nervous system: Gross moving all the extremities Extremities: No swelling in lower extremities bilaterally. Skin: No rashes, lesions or ulcers Psychiatry: No signs of agitation Catheter in place           Pressure Injury 02/11/23 Sacrum Mid Stage 2 -  Partial thickness loss of dermis presenting as a shallow open injury with a red, pink wound bed without slough. Stage 2 pressure injury on sacrum, present on admission (Active)  02/11/23   Location: Sacrum  Location Orientation: Mid  Staging: Stage 2 -  Partial thickness loss of dermis presenting as a shallow open injury with a red, pink wound bed without slough.  Wound Description (Comments): Stage 2 pressure injury on sacrum, present on admission  Present on Admission: Yes  Diet Orders (From admission, onward)     Start     Ordered   06/23/23 1627  DIET DYS 2 Room service appropriate? Yes; Fluid consistency: Thin  Diet effective now       Question Answer Comment  Room service  appropriate? Yes   Fluid consistency: Thin      06/23/23 1626            Objective: Vitals:   06/28/23 0459 06/28/23 0824 06/28/23 0847 06/28/23 0918  BP: 112/66     Pulse: 60     Resp: 19     Temp: 98.8 F (37.1 C)     TempSrc: Oral     SpO2: 100% 100% 100% 91%  Weight:      Height:        Intake/Output Summary (Last 24 hours) at 06/28/2023 1132 Last data filed at 06/28/2023 0500 Gross per 24 hour  Intake --  Output 550 ml  Net -550 ml   Filed Weights   06/24/23 0500 06/25/23 0500 06/26/23 0500  Weight: 58.1 kg 59.6 kg 59 kg    Scheduled Meds:  Chlorhexidine Gluconate Cloth  6 each Topical Daily   enoxaparin (LOVENOX) injection  40 mg Subcutaneous Q24H   mouth rinse  15 mL Mouth Rinse 4 times per day   sodium chloride flush  3 mL Intravenous Q12H   Continuous Infusions:  ampicillin-sulbactam (UNASYN) IV 3 g (06/28/23 0636)    Nutritional status     Body mass index is 19.78 kg/m.  Data Reviewed:   CBC: Recent Labs  Lab 06/24/23 0316 06/25/23 0326 06/26/23 0410 06/27/23 0347 06/28/23 0351  WBC 7.4 5.6 5.0 5.7 5.1  HGB 10.7* 10.8* 10.1* 9.9* 9.3*  HCT 37.9* 38.0* 35.1* 35.2* 33.4*  MCV 108.3* 107.3* 105.7* 110.3* 108.1*  PLT 243 244 251 198 213   Basic Metabolic Panel: Recent Labs  Lab 06/24/23 0316 06/25/23 0326 06/26/23 0410 06/27/23 0347 06/28/23 0351  NA 149* 148* 144 145 145  K 4.4 4.0 3.8 4.4 4.0  CL 109 108 102 104 103  CO2 31 32 36* 33* 37*  GLUCOSE 89 88 96 70 86  BUN 17 13 13 15 17   CREATININE 0.76 0.60* 0.61 0.63 0.60*  CALCIUM 8.8* 8.8* 8.4* 8.4* 8.5*  MG 2.2 2.0 2.0 2.3 2.1  PHOS 3.5  --   --   --   --    GFR: Estimated Creatinine Clearance: 53.3 mL/min (A) (by C-G formula based on SCr of 0.6 mg/dL (L)). Liver Function Tests: Recent Labs  Lab 06/24/23 0316 06/25/23 0326 06/26/23 0410 06/27/23 0347 06/28/23 0351  AST 97* 42* 24 18 16   ALT 208* 138* 95* 69* 52*  ALKPHOS 103 90 84 72 68  BILITOT 0.7 0.8 0.6 0.7  0.6  PROT 5.7* 5.4* 5.4* 5.0* 4.8*  ALBUMIN 2.9* 2.7* 2.7* 2.5* 2.5*   No results for input(s): "LIPASE", "AMYLASE" in the last 168 hours. Recent Labs  Lab 06/22/23 1407  AMMONIA <10   Coagulation Profile: No results for input(s): "INR", "PROTIME" in the last 168 hours. Cardiac Enzymes: No results for input(s): "CKTOTAL", "CKMB", "CKMBINDEX", "TROPONINI" in the last 168 hours. BNP (last 3 results) No results for input(s): "PROBNP" in the last 8760 hours. HbA1C: No results for input(s): "HGBA1C" in the last 72 hours. CBG: Recent Labs  Lab 06/27/23 0008 06/27/23 0603 06/27/23 1235 06/27/23 2255 06/28/23 0527  GLUCAP 84 76 90 122* 73   Lipid Profile: No results for input(s): "CHOL", "  HDL", "LDLCALC", "TRIG", "CHOLHDL", "LDLDIRECT" in the last 72 hours. Thyroid Function Tests: No results for input(s): "TSH", "T4TOTAL", "FREET4", "T3FREE", "THYROIDAB" in the last 72 hours. Anemia Panel: No results for input(s): "VITAMINB12", "FOLATE", "FERRITIN", "TIBC", "IRON", "RETICCTPCT" in the last 72 hours. Sepsis Labs: Recent Labs  Lab 06/23/23 0536  PROCALCITON <0.10    Recent Results (from the past 240 hours)  Culture, blood (Routine X 2) w Reflex to ID Panel     Status: None   Collection Time: 06/22/23  2:07 PM   Specimen: BLOOD LEFT ARM  Result Value Ref Range Status   Specimen Description   Final    BLOOD LEFT ARM Performed at [facility], [address] [place], [place] [address]    Special Requests   Final    BOTTLES DRAWN AEROBIC AND ANAEROBIC Blood Culture adequate volume Performed at [facility], [address], [place], [place] [address]    Culture   Final    NO GROWTH 5 DAYS Performed at [facility], [address] [place], [place] [address]    Report Status 06/27/2023 FINAL  Final  Resp panel by RT-PCR (RSV, Flu A&B, Covid) Anterior Nasal Swab     Status: None   Collection Time: 06/22/23  2:10 PM   Specimen: Anterior Nasal  Swab  Result Value Ref Range Status   SARS Coronavirus 2 by RT PCR NEGATIVE NEGATIVE Final    Comment: (NOTE) SARS-CoV-2 target nucleic acids are NOT DETECTED.  The SARS-CoV-2 RNA is generally detectable in upper respiratory specimens during the acute phase of infection. The lowest concentration of SARS-CoV-2 viral copies this assay can detect is 138 copies/mL. A negative result does not preclude SARS-Cov-2 infection and should not be used as the sole basis for treatment or other patient management decisions. A negative result may occur with  improper specimen collection/handling, submission of specimen other than nasopharyngeal swab, presence of viral mutation(s) within the areas targeted by this assay, and inadequate number of viral copies(<138 copies/mL). A negative result must be combined with clinical observations, patient history, and epidemiological information. The expected result is Negative.  Fact Sheet for Patients:  BloggerCourse.com  Fact Sheet for Healthcare Providers:  SeriousBroker.it  This test is no t yet approved or cleared by the [place] FDA and  has been authorized for detection and/or diagnosis of SARS-CoV-2 by FDA under an Emergency Use Authorization (EUA). This EUA will remain  in effect (meaning this test can be used) for the duration of the COVID-19 declaration under Section 564(b)(1) of the Act, 21 U.S.C.section 360bbb-3(b)(1), unless the authorization is terminated  or revoked sooner.       Influenza A by PCR NEGATIVE NEGATIVE Final   Influenza B by PCR NEGATIVE NEGATIVE Final    Comment: (NOTE) The Xpert Xpress SARS-CoV-2/FLU/RSV plus assay is intended as an aid in the diagnosis of influenza from Nasopharyngeal swab specimens and should not be used as a sole basis for treatment. Nasal washings and aspirates are unacceptable for Xpert Xpress SARS-CoV-2/FLU/RSV testing.  Fact Sheet for  Patients: BloggerCourse.com  Fact Sheet for Healthcare Providers: SeriousBroker.it  This test is not yet approved or cleared by the [place] FDA and has been authorized for detection and/or diagnosis of SARS-CoV-2 by FDA under an Emergency Use Authorization (EUA). This EUA will remain in effect (meaning this test can be used) for the duration of the COVID-19 declaration under Section 564(b)(1) of the Act, 21 U.S.C. section 360bbb-3(b)(1), unless  the authorization is terminated or revoked.     Resp Syncytial Virus by PCR NEGATIVE NEGATIVE Final    Comment: (NOTE) Fact Sheet for Patients: BloggerCourse.com  Fact Sheet for Healthcare Providers: SeriousBroker.it  This test is not yet approved or cleared by the [place] FDA and has been authorized for detection and/or diagnosis of SARS-CoV-2 by FDA under an Emergency Use Authorization (EUA). This EUA will remain in effect (meaning this test can be used) for the duration of the COVID-19 declaration under Section 564(b)(1) of the Act, 21 U.S.C. section 360bbb-3(b)(1), unless the authorization is terminated or revoked.  Performed at [facility], [address], [place], [place] [address]   Culture, blood (Routine X 2) w Reflex to ID Panel     Status: None (Preliminary result)   Collection Time: 06/23/23  5:28 AM   Specimen: BLOOD RIGHT HAND  Result Value Ref Range Status   Specimen Description   Final    BLOOD RIGHT HAND Performed at [facility], [address] [place], [place] [address]    Special Requests   Final    BOTTLES DRAWN AEROBIC AND ANAEROBIC Blood Culture results may not be optimal due to an inadequate volume of blood received in culture bottles Performed at [facility], [address] [place], [place] [address]    Culture   Final    NO GROWTH 4 DAYS Performed  at [facility], [address] [place], [place] [address]    Report Status PENDING  Incomplete  Remove and replace urinary cath (placed > 5 days) then obtain urine culture from new indwelling urinary catheter.     Status: None   Collection Time: 06/23/23  2:42 PM   Specimen: Urine, Catheterized  Result Value Ref Range Status   Specimen Description   Final    URINE, CATHETERIZED Performed at [facility], [address] [place], [place] [address]    Special Requests   Final    NONE Performed at [name], [address], [place], [place] [address]    Culture   Final    NO GROWTH Performed at [facility], [address], [place], [place] [address]    Report Status 06/24/2023 FINAL  Final  MRSA Next Gen by PCR, Nasal     Status: None   Collection Time: 06/23/23  2:44 PM   Specimen: Nasal Mucosa; Nasal Swab  Result Value Ref Range Status   MRSA by PCR Next Gen NOT DETECTED NOT DETECTED Final    Comment: (NOTE) The GeneXpert MRSA Assay (FDA approved for NASAL specimens only), is one component of a comprehensive MRSA colonization surveillance program. It is not intended to diagnose MRSA infection nor to guide or monitor treatment for MRSA infections. Test performance is not FDA approved in patients less than 22 years old. Performed at [facility], [address], [place], [place] [address]          Radiology Studies: No results found.         LOS: 6 days   Time spent= 35 mins    Miguel Rota, MD [facility]  If 7PM-7AM, please contact night-coverage  06/28/2023, 11:32 AM

## 2023-06-28 NOTE — Progress Notes (Signed)
 Patient cbg 68, gave 12.5 g of D50 and started D51/2NS at 75/hr per doctor order. Re-check 133.

## 2023-06-28 NOTE — TOC Progression Note (Signed)
 Transition of Care [facility]) - Progression Note   Patient Details  Name: GUMECINDO HOPKIN MRN: [id number] Date of Birth: [birth year]  [facility]) CM/SW Contact  Ewing Schlein, LCSW Phone Number: 06/28/2023, 10:04 AM  Clinical Narrative: CSW notified by [place] with [facility] that family has scheduled DME delivery for tomorrow.  Expected Discharge Plan: Home w Hospice Care ([facility]) Barriers to Discharge: Continued Medical Work up  Expected Discharge Plan and Services In-house Referral: Clinical Social Work Post Acute Care Choice: Hospice Living arrangements for the past 2 months: Skilled Nursing Facility DME Agency: NA HH Arranged: NA  Social Determinants of Health (SDOH) Interventions SDOH Screenings   Food Insecurity: No Food Insecurity (05/26/2023)  Housing: Low Risk  (05/26/2023)  Transportation Needs: No Transportation Needs (05/26/2023)  Utilities: Not At Risk (05/26/2023)  Depression (PHQ2-9): Low Risk  (12/16/2020)  Financial Resource Strain: Low Risk  (09/07/2017)  Physical Activity: Sufficiently Active (09/07/2017)  Social Connections: Moderately Isolated (05/26/2023)  Stress: No Stress Concern Present (09/07/2017)  Tobacco Use: Medium Risk (06/25/2023)   Readmission Risk Interventions    06/24/2023   10:27 AM 05/29/2023    3:53 PM 03/20/2023    2:33 PM  Readmission Risk Prevention Plan  Transportation Screening Complete Complete Complete  Medication Review (RN Care Manager) Complete Referral to Pharmacy Referral to Pharmacy  PCP or Specialist appointment within 3-5 days of discharge Complete Complete Complete  HRI or Home Care Consult Complete Complete Complete  SW Recovery Care/Counseling Consult Complete Complete Complete  Palliative Care Screening Not Applicable Not Applicable Not Applicable  Skilled Nursing Facility Complete Not Applicable Not Applicable

## 2023-06-28 NOTE — Progress Notes (Signed)
 SATURATION QUALIFICATIONS: (This note is used to comply with regulatory documentation for home oxygen)  Patient Saturations on Room Air at Rest = 66%  Patient Saturations on Room Air while Ambulating = N/A%  Patient Saturations on N/A Liters of oxygen while Ambulating = N/A%  Please briefly explain why patient needs home oxygen:  Patient was 66% on room air and once placed on 2L came back to 94%.

## 2023-06-28 NOTE — Progress Notes (Signed)
  Daily Progress Note   Patient Name: Alexander [name]       Date: 06/28/2023 DOB: Dec 03, 1932  Age: 89 y.o. MRN#: [id number] Attending Physician: Miguel Rota, MD Primary Care Physician: [name], MD Admit Date: 06/22/2023 Length of Stay: 6 days  Reason for Consultation/Follow-up: Establishing goals of care  Subjective:   CC: Patient in no distress  Following up regarding complex medical decision making.  Subjective:  No distress, resting in bed.   Objective:   Vital Signs:  BP 112/66 (BP Location: Right Arm)   Pulse 60   Temp 98.8 F (37.1 C) (Oral)   Resp 19   Ht 5\' 8"  (1.727 m)   Wt 59 kg   SpO2 91%   BMI 19.78 kg/m   Physical Exam: General: NAD, awake, cachectic, frail, pleasantly confused Cardiovascular: RRR Respiratory: no increased work of breathing noted, not in respiratory distress Extremities: Muscle wasting in all extremities Neuro: Only oriented to self  Imaging: I personally reviewed recent imaging.   Assessment & Plan:   Assessment: Patient is an 89 year old male with a past medical history of hypertension, hyperlipidemia, CAD, COPD, dementia, and BPH with chronic indwelling Foley catheter who was admitted from on 06/23/2023 for management of altered mental status mumbling.  Since admission, patient has received management for acute hypoxic respiratory failure secondary to aspiration pneumonia, acute hypoxic respiratory failure with hypercarbia and CO2 narcosis, and concern for UTI now on antibiotics.  Palliative medicine team consulted to assist with complex medical decision making. Of note, patient seen by palliative medicine team previously.  Recommendations/Plan: # Complex medical decision making/goals of care:   TOC note reviewed, plan for home with hospice.                 -  Code Status: Limited: Do not attempt resuscitation (DNR) -DNR-LIMITED -Do Not Intubate/DNI     # Psycho-social/Spiritual Support:  - Support System: Daughters-   Sonja and [place], sons-Darryl and [place] as per EMR review   daughter [name] is Product manager # Discharge Planning: Home with Hospice via [facility] Anticipate discharge on 06-29-23. Hospice liaisons following closely.  Discussed with: IDT  Thank you for allowing the palliative care team to participate in the care [name].  Low MDM Rosalin Hawking MD Palliative Care Provider PMT # [phone]  If patient remains symptomatic despite maximum doses, please call PMT at [phone] between 0700 and 1900. Outside of these hours, please call attending, as PMT does not have night coverage.

## 2023-06-29 DIAGNOSIS — G9341 Metabolic encephalopathy: Secondary | ICD-10-CM | POA: Diagnosis not present

## 2023-06-29 LAB — COMPREHENSIVE METABOLIC PANEL
ALT: 41 U/L (ref 0–44)
AST: 18 U/L (ref 15–41)
Albumin: 2.5 g/dL — ABNORMAL LOW (ref 3.5–5.0)
Alkaline Phosphatase: 69 U/L (ref 38–126)
Anion gap: 6 (ref 5–15)
BUN: 15 mg/dL (ref 8–23)
CO2: 38 mmol/L — ABNORMAL HIGH (ref 22–32)
Calcium: 8.2 mg/dL — ABNORMAL LOW (ref 8.9–10.3)
Chloride: 100 mmol/L (ref 98–111)
Creatinine, Ser: 0.51 mg/dL — ABNORMAL LOW (ref 0.61–1.24)
GFR, Estimated: >60 mL/min (ref >60)
Glucose, Bld: 98 mg/dL (ref 70–99)
Potassium: 3.7 mmol/L (ref 3.5–5.1)
Sodium: 144 mmol/L (ref 135–145)
Total Bilirubin: 0.5 mg/dL (ref 0.0–1.2)
Total Protein: 5 g/dL — ABNORMAL LOW (ref 6.5–8.1)

## 2023-06-29 LAB — GLUCOSE, CAPILLARY
Glucose-Capillary: 87 mg/dL (ref 70–99)
Glucose-Capillary: 86 mg/dL (ref 70–99)

## 2023-06-29 LAB — CBC
HCT: 34.2 % — ABNORMAL LOW (ref 39.0–52.0)
Hemoglobin: 9.9 g/dL — ABNORMAL LOW (ref 13.0–17.0)
MCH: 30.2 pg (ref 26.0–34.0)
MCHC: 28.9 g/dL — ABNORMAL LOW (ref 30.0–36.0)
MCV: 104.3 fL — ABNORMAL HIGH (ref 80.0–100.0)
Platelets: 204 10*3/uL (ref 150–400)
RBC: 3.28 MIL/uL — ABNORMAL LOW (ref 4.22–5.81)
RDW: 13.9 % (ref 11.5–15.5)
WBC: 3.9 10*3/uL — ABNORMAL LOW (ref 4.0–10.5)
nRBC: 0 % (ref 0.0–0.2)

## 2023-06-29 LAB — MAGNESIUM: Magnesium: 2 mg/dL (ref 1.7–2.4)

## 2023-06-29 NOTE — Discharge Instructions (Signed)
 Advised to follow-up in clinic.   Patient is being discharged to home with home hospice.

## 2023-06-29 NOTE — Progress Notes (Signed)
  Daily Progress Note   Patient Name: Alexander [name]       Date: 06/29/2023 DOB: 01-26-1933  Age: 89 y.o. MRN#: [id number] Attending Physician: Willeen Niece, MD Primary Care Physician: [name], MD Admit Date: 06/22/2023 Length of Stay: 7 days  Reason for Consultation/Follow-up: Establishing goals of care  Subjective:   CC: Patient in no distress  Following up regarding complex medical decision making.  Subjective:  No distress, resting in bed.   Objective:   Vital Signs:  BP 128/69 (BP Location: Left Arm)   Pulse 63   Temp 97.6 F (36.4 C) (Oral)   Resp 16   Ht 5\' 8"  (1.727 m)   Wt 59 kg   SpO2 95%   BMI 19.78 kg/m   Physical Exam: General: NAD, awake, cachectic, frail, pleasantly confused Cardiovascular: RRR Respiratory: no increased work of breathing noted, not in respiratory distress Extremities: Muscle wasting in all extremities Neuro: Only oriented to self  Imaging: I personally reviewed recent imaging.   Assessment & Plan:   Assessment: Patient is an 89 year old male with a past medical history of hypertension, hyperlipidemia, CAD, COPD, dementia, and BPH with chronic indwelling Foley catheter who was admitted from on 06/23/2023 for management of altered mental status mumbling.  Since admission, patient has received management for acute hypoxic respiratory failure secondary to aspiration pneumonia, acute hypoxic respiratory failure with hypercarbia and CO2 narcosis, and concern for UTI now on antibiotics.  Palliative medicine team consulted to assist with complex medical decision making. Of note, patient seen by palliative medicine team previously.  Recommendations/Plan: # Complex medical decision making/goals of care:   TOC note reviewed, plan for home with hospice on 3-7                -  Code Status: Limited: Do not attempt resuscitation (DNR) -DNR-LIMITED -Do Not Intubate/DNI     # Psycho-social/Spiritual Support:  - Support System:  Daughters-  [place] and [place], sons-Darryl and [place] as per EMR review   daughter [name] is Product manager # Discharge Planning: Home with Hospice via [facility] Anticipate discharge on 06-29-23. Hospice liaisons following closely.  Discussed with: IDT  Thank you for allowing the palliative care team to participate in the care Alexander [name].  Low MDM Rosalin Hawking MD Palliative Care Provider PMT # [phone]  If patient remains symptomatic despite maximum doses, please call PMT at [phone] between 0700 and 1900. Outside of these hours, please call attending, as PMT does not have night coverage.

## 2023-06-29 NOTE — TOC Transition Note (Signed)
 Transition of Care [facility]) - Discharge Note  Patient Details  Name: Alexander [name] MRN: [id number] Date of Birth: 1932/05/06  [facility]) CM/SW Contact:  Ewing Schlein, LCSW Phone Number: 06/29/2023, 12:36 PM  Clinical Narrative: Patient's DME through [facility] has been delivered to the home and he can be discharge home via ambulance. CSW confirmed discharge address with daughter: [address], [place], [place] [address]. Medical necessity form done; PTAR scheduled. Discharge packet completed. RN updated. TOC signing off.   Final next level of care: Home w Hospice Care Barriers to Discharge: Barriers Resolved  Patient Goals and CMS Choice Patient states their goals for this hospitalization and ongoing recovery are:: Discharge home with hospice CMS Medicare.gov Compare Post Acute Care list provided to:: Patient Represenative (must comment) Rosanne Gutting (daughter)) Choice offered to / list presented to : Adult Children  Discharge Placement Patient to be transferred to facility by: PTAR Name of family member notified: Rosanne Gutting (daughter) Patient and family notified of of transfer: 06/29/23  Discharge Plan and Services Additional resources added to the After Visit Summary for   In-house Referral: Clinical Social Work Post Acute Care Choice: Hospice          DME Agency: NA HH Arranged: NA  Social Drivers of Health (SDOH) Interventions SDOH Screenings   Food Insecurity: No Food Insecurity (05/26/2023)  Housing: Low Risk  (05/26/2023)  Transportation Needs: No Transportation Needs (05/26/2023)  Utilities: Not At Risk (05/26/2023)  Depression (PHQ2-9): Low Risk  (12/16/2020)  Financial Resource Strain: Low Risk  (09/07/2017)  Physical Activity: Sufficiently Active (09/07/2017)  Social Connections: Moderately Isolated (05/26/2023)  Stress: No Stress Concern Present (09/07/2017)  Tobacco Use: Medium Risk (06/25/2023)   Readmission Risk Interventions    06/24/2023   10:27 AM  05/29/2023    3:53 PM 03/20/2023    2:33 PM  Readmission Risk Prevention Plan  Transportation Screening Complete Complete Complete  Medication Review (RN Care Manager) Complete Referral to Pharmacy Referral to Pharmacy  PCP or Specialist appointment within 3-5 days of discharge Complete Complete Complete  HRI or Home Care Consult Complete Complete Complete  SW Recovery Care/Counseling Consult Complete Complete Complete  Palliative Care Screening Not Applicable Not Applicable Not Applicable  Skilled Nursing Facility Complete Not Applicable Not Applicable
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
## 2023-07-02 ENCOUNTER — Encounter: Payer: Self-pay | Admitting: Internal Medicine

## 2023-07-24 DEATH — deceased
